# Patient Record
Sex: Male | Born: 1985
Health system: Southern US, Community
[De-identification: ages and names within clinical notes are randomized; demographics above are authoritative.]

## PROBLEM LIST (undated history)

## (undated) DIAGNOSIS — D571 Sickle-cell disease without crisis: Secondary | ICD-10-CM

---
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
## 2007-01-15 ENCOUNTER — Emergency Department (HOSPITAL_COMMUNITY): Admission: EM | Admit: 2007-01-15 | Discharge: 2007-01-15 | Payer: Self-pay | Admitting: Emergency Medicine

## 2007-01-20 ENCOUNTER — Emergency Department (HOSPITAL_COMMUNITY): Admission: EM | Admit: 2007-01-20 | Discharge: 2007-01-20 | Payer: Self-pay | Admitting: Emergency Medicine

## 2007-01-30 ENCOUNTER — Ambulatory Visit: Payer: Self-pay | Admitting: Infectious Diseases

## 2007-01-30 ENCOUNTER — Inpatient Hospital Stay (HOSPITAL_COMMUNITY): Admission: EM | Admit: 2007-01-30 | Discharge: 2007-02-07 | Payer: Self-pay | Admitting: Emergency Medicine

## 2007-02-07 DIAGNOSIS — K829 Disease of gallbladder, unspecified: Secondary | ICD-10-CM

## 2007-02-07 HISTORY — DX: Disease of gallbladder, unspecified: K82.9

## 2007-02-12 ENCOUNTER — Ambulatory Visit: Payer: Self-pay | Admitting: Family Medicine

## 2007-02-15 ENCOUNTER — Encounter (INDEPENDENT_AMBULATORY_CARE_PROVIDER_SITE_OTHER): Payer: Self-pay | Admitting: Internal Medicine

## 2007-02-15 DIAGNOSIS — F17200 Nicotine dependence, unspecified, uncomplicated: Secondary | ICD-10-CM | POA: Insufficient documentation

## 2007-02-15 DIAGNOSIS — F1210 Cannabis abuse, uncomplicated: Secondary | ICD-10-CM | POA: Insufficient documentation

## 2007-02-15 DIAGNOSIS — D571 Sickle-cell disease without crisis: Secondary | ICD-10-CM | POA: Insufficient documentation

## 2007-02-15 HISTORY — DX: Cannabis abuse, uncomplicated: F12.10

## 2007-02-18 ENCOUNTER — Encounter (INDEPENDENT_AMBULATORY_CARE_PROVIDER_SITE_OTHER): Payer: Self-pay | Admitting: Internal Medicine

## 2007-08-01 ENCOUNTER — Inpatient Hospital Stay (HOSPITAL_COMMUNITY): Admission: EM | Admit: 2007-08-01 | Discharge: 2007-08-05 | Payer: Self-pay | Admitting: Emergency Medicine

## 2007-08-01 ENCOUNTER — Ambulatory Visit: Payer: Self-pay | Admitting: *Deleted

## 2007-09-03 ENCOUNTER — Ambulatory Visit: Payer: Self-pay | Admitting: Family Medicine

## 2007-09-30 ENCOUNTER — Ambulatory Visit: Payer: Self-pay | Admitting: Infectious Disease

## 2007-09-30 ENCOUNTER — Inpatient Hospital Stay (HOSPITAL_COMMUNITY): Admission: EM | Admit: 2007-09-30 | Discharge: 2007-10-08 | Payer: Self-pay | Admitting: Emergency Medicine

## 2007-10-07 ENCOUNTER — Ambulatory Visit: Payer: Self-pay | Admitting: Oncology

## 2007-10-07 ENCOUNTER — Encounter: Payer: Self-pay | Admitting: Infectious Disease

## 2011-04-11 NOTE — Discharge Summary (Signed)
NAME:  Jerry [name], Jerry [name] NO.:  [id number]   MEDICAL RECORD NO.:  [id number]          PATIENT TYPE:  INP   LOCATION:  6711                         FACILITY:  [facility]   PHYSICIAN:  Manning Charity, MD     DATE OF BIRTH:  1986/02/18   DATE OF ADMISSION:  08/01/2007  DATE OF DISCHARGE:  08/05/2007                               DISCHARGE SUMMARY   DISCHARGE DIAGNOSES:  1. Acute sickle cell crisis.  2. Sickle cell disease.  3. Acute-on-chronic anemia.  4. Gallbladder sludge.  5. Tobacco abuse.  6. Marijuana abuse.  7. History of pneumonia.   DISCHARGE MEDICATIONS:  1. Hydroxyurea 500 mg 3 tablets p.o. daily for a total of 1500 mg      daily.  2. MS Contin 30 mg 1 tab p.o. every 12.  3. Percocet 5/325 one to two tablets p.o. every 4 hours p.r.n.      breakthrough pain.  4. Naproxen 500 mg 1 tablet p.o. b.i.d. with meals.   DISPOSITION AND FOLLOWUP:  The patient is to see Dr. Audria Nine at  [facility] on September 29 at 2:45 p.m.  During this visit, his CBC  should be checked to assess for stable hemoglobin.  Also, given the  patient's pain crisis during hospitalization, he will be discharged on  naproxen 500 mg b.i.d. along with MS Contin 30 mg b.i.d. and Percocet  for breakthrough pain.  Pain should be assessed and patient advised on  pain medication.   CONSULTATIONS:  There were no consultations done on this  hospitalization.   BRIEF HISTORY AND PHYSICAL:  This is an 25 year old African-American  male with history of sickle cell disease and prior admissions for sickle  cell crisis who now comes in with complaints of left arm pain for 1 day.  The pain is from the left hand all the way to the shoulder.  He had  similar pain over the last few days.  The pain is 9 to 10/10, and he  complains of weakness in his left arm.  Pain is not improved with  Tylenol or Percocet in the hospital.  He has been missing doses of his  medicine over the last week.   PHYSICAL  EXAMINATION:  VITALS:  Temperature 97.7, blood pressure 146/87,  pulse 93, respirations are 16, oxygen saturation 99% on room air.  GENERAL:  No acute distress.  Appears a little drowsy but awake.  HEENT:  Eyes:  PERRLA.  EOMI.  Anicteric.  ENT:  Mucous membranes are  pink and moist.  RESPIRATION:  Clear to auscultation bilaterally.  CARDIOVASCULAR:  S1, S2 present.  Regular rate and rhythm.  No murmurs,  gallops, or rubs.  GI:  Positive bowel sounds.  Nontender, nondistended.  No palpable  masses.  EXTREMITIES:  No edema.  SKIN:  Cool and dry.  LYMPHATICS:  No adenopathy noted.  NEURO:  5/5 strength in all 4 extremities.  PSYCH:  Alert and oriented x3.  Pleasant affect.   LABORATORY:  WBC 16.8, hemoglobin of 10, and platelets of 363.  Sodium  of 139, potassium 3.6, chloride 107, bicarbonate of 28.  BUN 6,  creatinine 0.52, glucose of 101, bilirubin 2.0, alk phos 132.  AST 22,  ALT 16, protein 7.8, albumin 4.1, calcium 9.3.  PT 14.4, INR 1.1, APTT  32.   HOSPITAL COURSE:  1. Sickle cell crisis:  The patient was hydrated, given pain      medications including naproxen, Dilaudid, and morphine for      breakthrough pain.  The patient complains of increased pain with      point tenderness along the lateral epicondyle .  Therefore, an MRI      of the left upper extremity was obtained and found to be nagative      for osteomyelitis, avascular necrosis, or acute infarction.      Mariea Stable, MD  Electronically Signed      Manning Charity, MD  Electronically Signed    MA/MEDQ  D:  08/12/2007  T:  08/12/2007  Job:  [id number]   cc:   Maurice March, M.D.
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
## 2011-04-14 NOTE — Discharge Summary (Signed)
[place]. [facility]  Patient:    Jerry [name], Jerry [name]                  MRN: [id number] Adm. Date:  [id number] Disc. Date: [id number] Attending:  Delle Reining Dictator:   Melba Coon, M.D.                           Discharge Summary  DISCHARGE DIAGNOSIS:  Sickle disease vaso-occlusive pain crisis.  DISCHARGE MEDICATIONS:  Tylenol with Codeine elixir 12 mg per 5 ml, 4 cc q.4h. p.r.n. pain.  SUMMARY:  The patient is a 25 year old African-American male with hemoglobin SS disease who presented on February 28, 2001, with chest, back, and bilateral arm pain.  He states that the pain began approximately a day before admission and it usually with a crisis, he normally presents with low back pain.  The patient had been taking Tylenol No.3 at home without success.  The patient has also had URI symptoms for several days with cough and rhinorrhea.  His last crisis was approximately four months ago, and he states that he usually has then about every eight months.  He has never had acute chest syndrome that he or his mother is aware of. He had been seen at [facility] and had received a dose of ceftriaxone.  His temperature at home was approximately 101. The patient was admitted for treatment of his sickle cell vaso-occlusive pain crisis in the setting of fever and URI symptoms.  His chest x-ray was negative.  LABORATORY DATA:  White blood cell count 15.9, hemoglobin 7.6, hematocrit 20.9, platelets 428.  Sodium 139, potassium 3.3, chloride 105, bicarb 23, BUN 10, creatinine 0.4, glucose 95.  HOSPITAL COURSE:  The patient was admitted to the pediatric floor for further management.  #1 - Vaso-occlusive pain crisis.  The patient was initially started on a morphine PCA pump.  He felt that the pain was moderately controlled on the PCA pump and he was also treated with Toradol.  He had a varying response to the pain medicine and there was  thought to be possible behavioral or psychosocial reasons behind that.  The patient was eventually changed to demand dose PCA only.  After an additional day, he was able to be discontinued from his PCA pump.  His hemoglobin on discharge was 7.5.  His reticulocyte count on discharge was 2.4.  His blood cultures remained with no growth x 5 days.  The patient had been treated with ceftriaxone empirically due to his fever.  He showed no signs of infection on chest x-ray and his IV antibiotics were discontinued.  #2 - Psychosocial.  The patient was seen by Dr. Lindie Spruce.  There was concerns that he was possibly struggling with some depression.  He did acknowledge feeling down and depressed as well as getting very angry.  Dr. Lindie Spruce found that Jerry [name] is at a critical place in his life right now being a very young black male with a chronic illness and significant pain, very little active family help.  He lives with his mother, but realizes that she does not follow through on his behalf.  He also idolizes his distant father.  Dr. Lindie Spruce recommended referral to a psychologist.  CPS had been contacted and the investigator said that he would see that [name] would keep his appointments at [facility].  He also stated that he would make sure that Shamere receives counseling.  Mother has  a history of noncompliance and this was addressed during this hospitalization.  CONDITION ON DISCHARGE:  Good.  DISPOSITION:  Discharged to home.  DISCHARGE MEDICATIONS:  As per above.  DISCHARGE INSTRUCTIONS:  The patient was given no specific instructions in terms of his diet or activity.  He was given specific instructions and signs and symptoms to warrant further follow-up.  FOLLOW-UP:  The patient has an appointment at [facility] on March 13, 2001, at 8:30 a.m. with Dr. Waldo Laine.  [employer] [employer] Zenaida Niece will pick them up for the appointment at [facility].  Also had a follow-up appointment with Dr. Jenne Campus on March 19, 2001,  at 9:30 a.m.  Family voiced agreement and understanding of the above discharge plan and had no further questions. DD:  03/19/01 TD:  03/20/01 Job: [id number]

## 2011-04-14 NOTE — Discharge Summary (Signed)
NAME:  MICHAELA, SHANKEL NO.:  [id number]   MEDICAL RECORD NO.:  [id number]          PATIENT TYPE:  INP   LOCATION:  6121                         FACILITY:  [facility]   PHYSICIAN:  Eliseo Gum, M.D.   DATE OF BIRTH:  06-11-1986   DATE OF ADMISSION:  04/03/2006  DATE OF DISCHARGE:  04/05/2006                                 DISCHARGE SUMMARY   DISCHARGE DIAGNOSES:  1.  Sickle cell pain crisis.  2.  Tobacco abuse.  3.  Medical noncompliance with hydroxyurea.   DISCHARGE MEDICATIONS:  1.  Hydroxyurea 500 mg -- take three pills daily.  2.  Toradol 10 mg one q.6 h., only 15 provided.  3.  Percocet -- take one to two pills q.4 h. as needed for pain.   PROCEDURES:  There was a chest x-ray on Apr 03, 2006 showing no acute  cardiopulmonary disease, cardiac size towards the upper limits of normal, no  pulmonary vascular congestion or active lung process.  It did say that  osseous changes, particularly involving the thoracic and lumbar spine, are  noted, compatible with chronic sickle cell disease.   A hip x-ray was done on the right and x-rays of the femur and knee and  cervical and thoracic spine and they were all negative; those were done on  Apr 03, 2006.  Only some bony changes compatible with sickle cell disease  were noted in the thoracic spine.   CONSULTATIONS:  None.   HISTORY AND PHYSICAL:  For full history and physical, please see consult the  chart, but in brief, Mr. Eichorn is a 25 year old African American man with  sickle cell disease and complaining of sickle cell crisis in his back,  neck, right ribs and back of the head and right upper leg and lower back.  He complained of chills.  His symptoms started at 8:00 in the morning on the  day of admission and worsened with shortness of breath.  He denied any  wheezing, nausea, vomiting or abdominal pain or fever or joint swelling.  He  takes hydroxyurea 1500 mg nightly and said he took a dose the night prior  to  admission, but says that in actuality, he had not really taken it regularly  for about 2 months.  He also just recently finished a course of amoxicillin  after he had his tooth pulled.   ALLERGIES:  No allergies.   PHYSICAL EXAMINATION:  VITAL SIGNS:  Temperature 98.5, blood pressure  121/71, pulse 86, respirations 20, O2 SAT 97% on room air.  GENERAL:  He is alert and oriented, in acute distress secondary to pain  which was 10/10.  HEENT:  Eyes anicteric.  Mucous membranes are moist.  No erythema.  NECK:  Tender to light palpation on the posterior neck and it was  exquisitely tender.  LUNGS:  Clear to auscultation bilaterally.  No wheezes.  No rales.  HEART:  Regular rate and rhythm.  No murmurs, rubs, or gallops.  ABDOMEN:  Decreased breath sounds and the abdomen was tense, as the patient  was bracing himself against the pain.  EXTREMITIES:  Right hip  and thigh were exquisitely tender to palpation.  SKIN:  Warm and dry.  MUSCULOSKELETAL:  He had decreased movement secondary to pain.  NEUROLOGIC:  Nonfocal.  PSYCHIATRIC:  Calm and appropriate.   LABORATORY DATA:  Basic metabolic panel was normal.  Bilirubin was 1.8,  protein was 8.1, albumin was 4.9 and everything was normal.  Hemoglobin  11.1, hematocrit 32.7 with an MCV of 106.4 and a reticulocyte count of 9.4%.  The white count was 11.6 with an ANC of 8 and platelets 415,000.  Sickle  cells were noted on CBC.  UA was within normal limits.   HOSPITAL COURSE:  He was felt to be in sickle cell pain crisis involving the  neck, ribs, right hip and thigh plus right knee and so he was started on  Dilaudid 1 mg to 4 mg IV p.r.n. and Toradol standing dose with Protonix  b.i.d. to protect against the GI side-effects.  This was sufficient to  control his pain.  We hydrated him gently and evaluated all the areas of  complaints with x-rays to rule out avascular necrosis or fat embolism.  Since the x-rays were normal, his symptoms were  most consistent with pain  crisis relating to sickle cell disease.  We drew 2 sets of blood cultures,  checked a urine drug screen and blood culture was negative.  We also checked  a ferritin, RBC folate and B12 in view of his elevated MCV, although we  mostly attributed that to the elevated reticulocyte count.  We also checked  a GC and Chlamydia because he noted that he was sexually active, but it was  negative.  Actually, the urine drug screen was positive for opiates and  benzodiazepines; he had gotten the opiates in the ED.  His ferritin was  elevated slightly at 596.  His B12 was 541, which is normal.  His RBC folate  was 1157, which is elevated.  His bilirubin did increase over the course of  his hospitalization from 1.8 to 3.4, but the rest of his liver function  tests remained normal.  His hemoglobin only dropped as low at 9.8 and he had  a white count of 13.4 at one point, which is the highest it was during his  hospitalization.  On the day of discharge, he stated that his pain was 7/10,  but was walking around and making his bed.  He first said that he wanted to  stay in the hospital for a couple more days to get pain medicines, but when  we offered him oral pain medicines, he really decided he would rather go  home; we were comfortable with this decision, because he was ambulating and  did not appear to be in any acute distress.   He was also advised during his hospitalization to quit smoking, particularly  of a sickle cell patient, and we ordered a smoking cessation consultation  for him, which did occur.   DISCHARGE PHYSICAL EXAM:  VITAL SIGNS:  Temperature was 98.7, blood pressure  was 121/71, pulse 87-104, respirations 18-20 and O2 SAT was 97% on room air.  GENERAL:  He is alert and oriented, in no acute distress.  He rated his pain  at 7/10, but was walking around. HEART:  Regular rate and rhythm.  No rubs; a mild flow murmur was present,  however.  LUNGS:  Clear to  auscultation bilaterally.  ABDOMEN:  Nontender and non-distended.  Flat.  EXTREMITIES:  He is walking without difficulty.  NEUROLOGIC:  Intact.  DISCHARGE LABORATORY DATA:  Hemoglobin 9.8, hematocrit 28.2, MCV is 105.9,  white count 13.4, platelets 319,000.  His RDW was elevated also at 19.6%.  His basic metabolic panel was normal and again, his bilirubin was 3.4 on  discharge.   DISCHARGE MEDICATIONS:  He was discharged with Toradol for several days,  only to complete a total of a 5-day course of Toradol, which is the maximum  recommended course of this medication.  He was also given Percocets.  These  medications adequately controlled his pain.   FOLLOWUP:  He was told to follow up with his sickle cell physician at [facility] and that is Dr. Joycelyn Man, and he would need to call for an  appointment.   SPECIAL DISCHARGE INSTRUCTIONS:  We also instructed him to take his  hydroxyurea every day and this point was reinforced by the case workers who  visited him during his hospitalization.      Clearance Coots, M.D.      Eliseo Gum, M.D.  Electronically Signed    IN/MEDQ  D:  04/10/2006  T:  04/11/2006  Job:  [id number]   cc:   [facility] Hematology/Oncology Charise Killian, MD

## 2011-04-14 NOTE — Discharge Summary (Signed)
[place]  Patient:    Jerry [name], Jerry [name] Visit Number: [id number] MRN: [id number]          Service Type: Attending:  Susette Racer. Leotis Shames, M.D. Dictated by:   Ellwood Handler, M.D. Adm. Date:  12/15/01 Disc. Date: 12/24/01                             Discharge Summary  DISCHARGE DIAGNOSES: 1. Sickle cell disease, acute pain crisis. 2. Acute chest syndrome. 3. History of sickle cell anemia (SS) disease.  DISCHARGE MEDICATIONS: 1. Penicillin DK 250 mg p.o. b.i.d. 2. Ceftin 250 mg p.o. b.i.d. x2 days. 3. Tylenol #3 p.o. q.6h. p.r.n. pain.  HISTORY OF PRESENT ILLNESS:  Please see full H&P for details.  In brief, Jerry [name] is a 25 year old African-American male with a history of sickle cell anemia.  He presented to [facility] with a 2-day history of bilateral rib pain and back pain.  The patient has been hospitalized multiple times in the past for acute pain crises.  LABS ON ADMISSION:  WBC 16.5, hemoglobin 9.5, hematocrit 26.6, platelets 375. Reticulocyte count 11.4%.  Absolute reticulocyte 339.75.  HOSPITAL COURSE: #1 - SICKLE CELL VASO-OCCLUSIVE PAIN CRISES.  His pain was managed with morphine PCA pump, with gradual improvement and control of pain.  His hemoglobin decreased to 6.8 on December 21, 2001 and the patient was transfused one unit of packed red blood cells, which he tolerated well.  #2 - ACUTE CHEST SYNDROME.  The patient was doing fairly well until about two days after admission, when he developed fever to 104, tachypnea and decreased O2 saturation.  The patient was treated with IV Rocephin and Zithromax.  An initial chest x-ray revealed increased markings in the right middle lobe.  A follow-up chest x-ray then revealed bilateral basilar infiltrates, with pleural effusions.  In addition to antibiotic treatment the patient was encouraged to do incentive spirometry q.1-2h.; he also received  albuterol nebulizers.  Blood cultures were obtained and showed no growth x5 days.  The patient gradually defervesced and his respiratory status gradually improved. By the day of discharge the patient was breathing comfortably with good pain control, and maintaining oxygen saturations of 95% on room air.  DISCHARGE INSTRUCTIONS:  The patient was discharged to home in stable condition.  FOLLOW-UP:  The following appointments were scheduled: 1. December 30, 2001 -- Dr. Jenne Campus, [facility].  3:30 p.m. 2. January 02, 2002 -- Dr. Waldo Laine, [facility] Hematology/Oncology. 12:00 p.m. 3. January 02, 2002 -- The patient is to receive transcranial Doppler at    1:00 p.m. following appointment with Dr. Waldo Laine. Dictated by:   Ellwood Handler, M.D. Attending:  Susette Racer. Leotis Shames, M.D. DD:  06/05/02 TD:  06/09/02 Job: [id number]

## 2011-04-14 NOTE — Consult Note (Signed)
NAME:  Jerry [name], Jerry [name] NO.:  [id number]   MEDICAL RECORD NO.:  [id number]          PATIENT TYPE:  INP   LOCATION:  5743                         FACILITY:  [facility]   PHYSICIAN:  Clovis Pu. [name], Jerry BaileyDATE OF BIRTH:  [birth year]   DATE OF CONSULTATION:  01/31/2007  DATE OF DISCHARGE:                                 CONSULTATION   REASON FOR CONSULTATION:  Abdominal pain and leukocytosis.   HISTORY OF PRESENT ILLNESS:  Jerry [name] is an 25 year old male patient  history of sickle cell disease as well as some history of noncompliance  although currently denies medication noncompliance issues at this point.  He was admitted with extremity and back and neck pain as well as  leukocytosis - white count 21,000.  This initially was presented to be  related to his sickle cell crisis.  He has had repeated hospitalizations  since 2001 for same issues.  Today on exam the patient was felt to have  abdominal pain, diminished bowel sounds and some guarding and diffuse  abdominal nonfocal pain.  His white count had also increased to the  23,000 range.  A CT scan was ordered, took some time to get this done  despite a stat order but eventually this was performed.  The CT was  reviewed per Dr. Luisa Hart and the radiologist and no acute findings have  been noted.  The patient continues to have nausea although his pain has  resolved.  He has had low-grade fevers in the past 24 hours.  Surgical  evaluation has been requested.   REVIEW OF SYSTEMS:  As above.  Again, the patient is complaining of  continued nausea.  He did vomit after receiving oral CT contrast.  He  has had no diarrhea or other GI complaints.   PAST MEDICAL HISTORY:  1. Sickle cell disease with repeated crisis episodes x9 since 2001.      He is followed at [facility].  2. Tobacco abuse.   PAST SURGICAL HISTORY:  None documented.   ALLERGIES:  NKDA.   CURRENT MEDICATIONS:  At home the patient takes hydroxyurea.  Please  see  the patient's medication administration record for current medications  here at the hospital.   PHYSICAL EXAMINATION:  GENERAL:  Pleasant male patient currently denying  pain, complaining of nausea.  VITAL SIGNS:  The patient has just returned from CT.  I do not have any  recent vital signs but over the past 24 hours his T-max has been 100.6,  systolic blood pressures ranged anywhere from 130s to 140s and  diastolic's 80-90, mildly tachycardic heart rate 110s and respirations  in the 20s.  NEURO:  The patient is alert, oriented x3, moving all extremities x4.  No focal deficits.  HEENT:  Head normocephalic.  Sclerae not injected.  NECK:  Supple.  No adenopathy.  CHEST:  Bilateral lung sounds clear to auscultation.  Respiratory effort  is nonlabored.  He is on nasal cannula O2.  CARDIAC:  Grade 3/6 systolic ejection murmur.  Pulse is tachycardic but  regular.  No JVD.  Pulses regular.  ABDOMEN:  Soft, flat, nondistended, mildly tender without  any focal  signs.  Bowel sounds are present.  No obvious hepatosplenomegaly, masses  or bruits on exam.  EXTREMITIES:  Symmetrical in appearance without edema, cyanosis or  clubbing.  Pulses are palpable.   LABORATORIES:  White count yesterday 21,000, today is 24,500; hemoglobin  10.1, platelets 365,000.  He was also found to have target cells and  Howell-Jolly bodies as well.  Sodium 129, potassium 3.9, CO2 26, glucose  110, BUN 10, creatinine 0.6.  Urinalysis is negative.   DIAGNOSTICS:  Chest x-ray done yesterday shows no acute process.  CT of  the abdomen and pelvis as noted.   IMPRESSION:  1. Diffuse abdominal pain resolved.  2. Leukocytosis.  3. Sickle cell with acute crisis.   PLAN:  1. Continue to treat at this point pain as if it is related to sickle      cell crisis, i.e., hydration, pain medication, etc.  2. Nausea could be coming from hyponatremia, would correct this.  3. Uncertain at this point why the patient is having  significant      leukocytosis and low-grade fevers.  Urinalysis is negative and CT      is unrevealing.  4. No surgical issues at this time.  We will continue to follow with      you.      Allison L. Rennis Harding, N.P.      Jerry [name], M.D.  Electronically Signed    ALE/MEDQ  D:  01/31/2007  T:  01/31/2007  Job:  [id number]

## 2011-04-14 NOTE — Discharge Summary (Signed)
[place]  Patient:    Jerry [name], Jerry [name]                  MRN: [id number] Adm. Date:  [id number] Disc. Date: 02/27/00 Attending:  Gerrianne Scale Dictator:   Denese Killings, M.D. CC:         [facility]             [facility]a M[facility], M.D. at [facility], pediatric [facility]                           Discharge Summary  DATE OF BIRTH:  September 21, 1986  PRIMARY CARE PHYSICIAN:  [facility].  CHIEF COMPLAINT:  This is an 25 year old African-American male with a sickle cell pain crisis.  HISTORY OF PRESENT ILLNESS:  Jerry [name] presented to the emergency department with a sickle cell pain crisis on the day of admission that was localized to his back.  His pain crisis is usually controlled with Tylenol No. 3, but the mom had run out of this medication and so she presented to the emergency department.  He received morphine 4 mg IM and was discharged home, but returned a few hours later with unmanageable pain after the morphine.  He was then admitted for that reason.  At the time of his presentation, he was afebrile.  His hemoglobin and hematocrit were 8.4 and 23.9.  A chest x-ray was obtained that was negative.  The patient was started on a morphine PCA at 0.5 mg per hour at baseline, with morphine 0.5 mg every 10 minutes.  He also was started on Tylenol No. 3 and ibuprofen, and started on maintenance IV fluids.  On hospital day #2, he had a spike of his temperature to 102 degrees. Blood cultures and repeat chest x-ray were obtained and both were negative. He had two episodes of desaturations to less than 92%, was placed on 1 liter of oxygen.  He was also started on Rocephin on day #2.  On day #2, he was also started on incentive spirometry.  On day #3, he had another temperature spike to 103.6 and another repeat chest x-ray was negative.  He was started on a Z pack.   He continued to take in good intake and urine output throughout the entire course.  On day #4 of his admission, he was having a lot of abdominal pain.  A KUB was obtained which showed an ileus and a lot of stool.  Later on day #4, he had a couple of bowel movements and his abdominal pain resolved. On day #5, he stated that his pain was much better, he was having no abdominal pain, only back pains.  He was able to be weaned off of the morphine PCA pump and was maintained on Tylenol No. 3.  He remained afebrile for the last 24 hours of his admission and continued to take in good p.o. intake and have good urine output.  At the time of discharge, his hemoglobin and hematocrit were 6.8 and 20.7.  His pain at the time of discharge was 5-6/10.  FOLLOW-UP:  On discharge, the patient was instructed to make an appointment with his [facility] hematologist, Dr. Waldo Laine, by calling the [facility] [place] on the day after his discharge.  MEDICATIONS:  He will continue  with his pain management with Tylenol, with codeine at home 12.5 cc q.6h. as needed, and azithromycin 250 mg - his last dose will be at 8 p.m. on the day of discharge, and also to continue with his penicillin prophylaxis of 250 mg once a day.  He was given 30 days of this and three refills.  SPECIAL INSTRUCTIONS:  Return to medical care for fevers, difficulty with breathing, or severe pain. DD:  02/27/00 TD:  02/27/00 Job: [id number]

## 2011-04-14 NOTE — Discharge Summary (Signed)
[place]  Patient:    MEREK, NIU Visit Number: [id number] MRN: [id number]          Service Type: PED Location: PEDS [phone] Attending Physician:  Asher Muir Admit Date:  11/07/2001 Discharge Date: 11/17/2001                             Discharge Summary  PRIMARY CARE PHYSICIAN:  Dr. Tarri Abernethy at [facility].  CONSULTATIONS:  Dr. Waldo Laine at [facility].  FINAL DIAGNOSES: 1. Sickle cell disease. 2. Acute pain crisis. 3. Right upper extremity pain.  LABORATORY DATA:  Blood culture from 12/15, negative x 5 days.  CBC from 12/12, white blood cell count 23.8, hemoglobin 9.1, hematocrit 25.3, platelet count 573.  Differential 92% neutrophils, 4% lymphocytes, 4% monocytes. Reticulocyte 10.8.  AST 74, ALP 28, alkaline phosphatase 182, total bilirubin 2.8.  Urinalysis from 12/13, specific gravity 1.009, pH 7.  Negative for bilirubin, nitrites, and leukocyte esterase.  Right upper extremity x-ray completed on 11/16/01, results consistent with effusion of the elbow joint, but no bone involvement or periosteal elevation.  ADMISSION HISTORY AND PHYSICAL:  Please see admission H&P for full details.  HOSPITAL COURSE:  This is a 25 year old male with sickle cell disease who complained of bilateral lower extremity pain from his kneecaps to his ankles after wrestling practice on the day of admission.  He also had suffered a fall on his left knee approximately one week ago.  He was evaluated at [facility], and had a follow-up appointment with [facility] scheduled sometime in January.  His mom also stated that he had a fever to 102 degrees prior to admission.  He was brought into the hospital with the diagnosis of pain crisis, and he received IV fluids and pain management.  #1 - NEUROLOGIC:  With regard to his pain, he was placed on a PCA pump with morphine with an original basal rate of 2 mg per hour, and an on demand  of 0.25 mg every 10 minutes, with a max of 2 g.  He also received Toradol for his first two days of admission which was eventually discontinued.  His pain control improved over his hospitalization, eventually he was decreased on his basal rate to 1.0 while his on demand rate remained the same.  He was moved over to p.o. pain medications before discharge, specifically Tylenol with Codeine which he tolerated well.  On the day of discharge, he was still complaining of right upper extremity pain with extension, however, he had no pain with palpation of the joint.  It was thought at the time that his pain was secondary to IV infiltration at the site.  #2 - INFECTIOUS DISEASE:  The patient had spiking fevers consistently through the early part of his hospitalization with temperature maxs up to about 103. He was originally started on antibiotics, specifically, ceftriaxone 1 g IV q.24h.  He received ceftriaxone for 3 days, and on hospital day #2, Zithromax 450 mg x 1 was added to his regimen.  He completed a full course of Zithromax. On the day of discharge, he had been afebrile for over 48 hours.  #3 - RESPIRATORY/CARDIOVASCULAR:  During his hospitalization he had a normal respiratory rate and was saturating well on room air.  His cardiovascular status was normal.  At one point during his hospitalization, he had some high blood pressures with values of 163/123.  However, this resolved  without any intervention.  #4 - GASTROINTESTINAL:  During his hospitalization he had issues with constipation, probably secondary to the morphine that we used for his pain. He received occasional doses of milk of magnesia approximately 15 cc, which resulted in a bowel movement each time.  On the day of discharge, he was stooling on his own.  INSTRUCTIONS GIVEN TO PATIENT AND FAMILY:  Specific instructions were given to the patient regarding his pain management with p.o. medications, and his need for follow-up  appointment with Dr. Tarri Abernethy at [facility] at 10:00 on 12/23.  He was also told to follow up with Dr. Waldo Laine at [facility] regarding his sickle cell disease.  DISCHARGE MEDICATIONS: 1. Colace 100 mg q.d. 2. Tylenol No. 3 with codeine 15 mL q.4h. p.r.n. pain. 3. Tylenol Extra Strength 500 mg q.4-6h. p.r.n. pain. Attending Physician:  Asher Muir DD:  11/17/01 TD:  11/19/01 Job: [phone]

## 2011-04-14 NOTE — H&P (Signed)
NAME:  Jerry [name], Jerry [name] NO.:  [id number]   MEDICAL RECORD NO.:  [id number]          PATIENT TYPE:  EMS   LOCATION:  MAJO                         FACILITY:  [facility]   PHYSICIAN:  Elliot Cousin, M.D.    DATE OF BIRTH:  [birth year]   DATE OF ADMISSION:  09/24/2006  DATE OF DISCHARGE:                                HISTORY & PHYSICAL   PRIMARY CARE PHYSICIAN:  [facility].   CHIEF COMPLAINT:  Three-day history of right greater than left leg pain and  left greater than right arm pain.   HISTORY OF PRESENT ILLNESS:  The patient is an 25 year old man with a past  medical history significant for sickle cell disease who presents to the  emergency department with a three-day history of bilateral lateral leg pain  and bilateral arm pain.  The pain is worse in the right leg and left arm.  He describes the pain has throbbing and sharp.  He says that the pain at its  worst is rated a 9-10/10 in intensity.  It was initially intermittent three  days ago.  However, it has been progressive and chronic for the past 24  hours.  The patient has not taken any type of analgesic or pain medication  over the counter.  He continues to take hydroxyurea as previously  prescribed.  He says that his pain is indicative of a sickle cell pain  crisis.  He has had no associated fever, chills, nausea, vomiting, diarrhea,  chest pain, shortness of breath, or abdominal pain.  He has had a productive  cough with tan-colored sputum over the past two days.  He does smoke half a  pack of cigarettes per day.   During the evaluation in the emergency department, the patient is noted to  be afebrile with a temperature of 99.4.  He is hemodynamically stable.  His  white blood cell count is mildly elevated at 11.9 and his hemoglobin is  mildly low at 10.6.  His reticulocyte count is elevated at 281.3.  The  patient will be admitted for further evaluation and management.   PAST MEDICAL HISTORY:  1.  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  2. Tobacco abuse.   MEDICATIONS:  Hydroxyurea 500 mg three tablets daily.   ALLERGIES:  No known drug allergies.   SOCIAL HISTORY:  The patient is engaged to be married.  [place]  He has no children.  He is unemployed.  He is a  Consulting civil engineer at [employer]  He smokes approximately eight cigarettes per day.  He  denies alcohol and illicit drug use.   FAMILY HISTORY:  The patient's mother is 45 years of age and she has the  sickle cell trait.  His father is 67 years of age and has the sickle cell  trait and Crohn's disease.   REVIEW OF SYSTEMS:  Other than the history of present illness which includes  arm pain, leg pain and productive cough, the patient's review of systems is  otherwise negative.   REVIEW OF SYSTEMS:  The patient's review of systems is otherwise negative.   PHYSICAL EXAMINATION:  VITAL SIGNS:  Temperature 99.4, blood pressure  114/69, pulse 66, respiratory rate 20, oxygen saturation 98% on room air.  GENERAL:  The patient is an 25 year old African American man who is  currently lying in bed in no acute distress.  HEENT:  Head is normocephalic, atraumatic.  Pupils are equal, round and  reactive to light.  Extraocular movements are intact.  Conjunctivae clear.  Sclerae white.  Tympanic membranes not examined.  Nasal mucosa is moist.  No  sinus tenderness.  Oropharynx with moist mucous membranes.  No posterior  exudates or erythema. Teeth are in good repair.  NECK:  Supple. No adenopathy.  No thyromegaly.  No bruits.  No JVD.  LUNGS:  Clear to auscultation bilaterally.  HEART:  S1 and S2 with a soft systolic murmur.  ABDOMEN:  Bowel sounds are present.  Soft, nontender, nondistended.  No  hepatosplenomegaly.  No masses palpated.  EXTREMITIES:  The patient has moderate tenderness over the right greater  than left legs and left greater  than right arms.  No erythema or edema  noted.  The patient has a decrease in range of motion of his right leg and  left arm secondary to discomfort.  Pedal pulses are 2+ bilaterally.  No  acute joint findings.  NEUROLOGIC:  The patient is alert and oriented x3.  Cranial nerves II-XII  intact..  Strength is 5/5 throughout.  Sensation is intact.   ADMISSION LABORATORY DATA:  Reticulocyte count percentage 9.6.  Absolute  reticulocyte count 281.3.  WBC 11.9, hemoglobin 10.6, hematocrit 30.3, MCV  103.7, platelets 323.  Chest x-ray:  No acute cardiopulmonary disease.   ASSESSMENT:  1. Bilateral arm pain and leg pain consistent with sickle cell pain      crisis.  2. Anemia secondary to chronic disease (sickle cell disease).  The      patient's hemoglobin is 10.6.  He is unaware of his baseline      hemoglobin.  The patient's anemia is also macrocytic. His MCV is 103.7.  3. Leukocytosis.  The patient's leukocytosis is mild.  His white blood      cell count is 11.9.  More than likely the leukocytosis is secondary to      the sickle cell pain crisis.  He is afebrile.  4. Mildly productive cough.  The patient's chest x-ray is clear.  However,      there is a low threshold for starting antibiotic treatment if his      pulmonary symptoms worsen and/or if he becomes febrile.  5. Tobacco abuse.   PLAN:  1. The patient will be admitted for further evaluation management.  2. Will hydrate.  The patient will have normal saline with potassium      chloride added.  3. Will treat his pain with morphine intravenously and oral morphine      q.12h.  Will also add Toradol 15 mg IV q.6h. for 24 hours and then      p.r.n. thereafter.  4. Continue hydroxyurea as previously ordered.  5. Will add folic acid 1 mg daily.  Will assess the patient's vitamin B12      and RBC folate levels. 6. Tobacco cessation counseling.  Will add a nicotine patch of 7 mg daily.  7. Will check a TSH, an urinalysis and CMET.  Will  monitor his hemoglobin      and  hematocrit closely.      Elliot Cousin, M.D.  Electronically Signed     DF/MEDQ  D:  09/25/2006  T:  09/25/2006  Job:  [id number]

## 2011-04-14 NOTE — Discharge Summary (Signed)
NAME:  LEAM, MADERO NO.:  [id number]   MEDICAL RECORD NO.:  [id number]          PATIENT TYPE:  INP   LOCATION:  5743                         FACILITY:  [facility]   PHYSICIAN:  Fransisco Hertz, M.D.  DATE OF BIRTH:  06-06-1986   DATE OF ADMISSION:  DATE OF DISCHARGE:  02/07/2007                               DISCHARGE SUMMARY   DISCHARGE DIAGNOSES:  1. Acute sickle cell pain crisis - improved.  2. Sickle cell disease.  3. Acute on chronic anemia.  4. Gallbladder sludge.  5. Tobacco abuse.  6. Marijuana abuse.   DISCHARGE MEDICATIONS:  1. Hydroxyurea 500 mg p.o. t.i.d.  2. Folic acid 1 mg p.o. daily.  3. Ibuprofen 800 mg p.o. every 8 hours p.r.n. for pain.  4. Hydromorphone 2 mg p.o. every 4 hours p.r.n. for breakthrough pain.   DISPOSITION AND FOLLOW-UP:  At the time of discharge, the patient's pain  was markedly improved and under adequate control with  ibuprofen and  oral Dilaudid.  Additionally, his hemoglobin had been stable after  receiving 2 units of packed red blood cells two days prior to discharge.  He is scheduled for a hospital follow-up with Dr. Phillips Odor on April 2nd  at 3:15 p.m.  The patient was also asked to contact Dr. Joycelyn Man, his  hematologist at [facility], in order to schedule an appointment in the next 2-  4 weeks.  The patient should also be referred back to Dr. Luisa Hart or one  of his partners at [facility] for evaluation for possible  elective cholecystectomy.   PROCEDURES PERFORMED:  1. CT of the abdomen and pelvis with contrast:  This was performed on      January 31, 2007 secondary to significant abdominal tenderness and      rigidity.  No acute intra-abdominal findings were noted.  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  No      intrapelvic findings were noted either.  2. Abdominal ultrasound:  This was performed on February 01, 2007 and was      remarkable for a gallbladder filled with echogenic  sludge.  No      definite shadowing to suggest gallstones was seen.  No findings of      acute cholecystitis were present.  The common bile duct was found      to be of normal caliber.  The spleen appeared small.  3. A 2-view plain film x-ray of the right knee:  Normal alignment      without fractures or joint effusion were seen.  No acute findings      were present.  4. A 3-view x-ray plain film x-ray of the left hip:  This was      performed on February 04, 2007 and reveals no acute or specific      abnormality of the left hip.  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  End-plate      deformities in the lower lumbar spine consistent with sickle cell  disease were also commented upon.   CONSULTATIONS:  Dr. Harriette Bouillon, [facility].   BRIEF ADMITTING HISTORY AND PHYSICAL:  Mr. Cardiff is an 25 year old  man with a history of sickle cell disease and multiple sickle cell pain  crises as recently as February of 2008 who presented to the [facility]  Emergency Department with a 2-day history of severe pain in his chest,  ribs, back, and bilateral proximal lower extremities.  He reports that  he has been compliant with his hydroxyurea therapy and was last seen by  his hematologist, Dr. Joycelyn Man, in February of 2008.  At that time, he  was told that everything appeared to be well.  The patient was last seen  in the [facility] Emergency Department in early February for a pain  crisis but reported that his symptoms at that time were far less severe  than at the time of this presentation.  He denied any preceding cold  symptoms, nausea, vomiting, constipation, or diarrhea.  He stated that  he had been feeling well in general and had a normal appetite until his  pain symptoms began two days prior to presentation.   PHYSICAL EXAMINATION:  VITAL SIGNS:  Temperature 97.3, blood pressure  147/96, pulse 102, respirations 18,  oxygen saturation 96% on room air.  GENERAL:  The patient is a slender young man lying in bed comfortably in  no acute distress.  HEENT:  Pupils are constricted but equal, round, and reactive to light  and accommodation.  Oropharynx is dry, but no lesions are noted.  NECK:  Supple without lymphadenopathy.  RESPIRATORY:  Lungs are clear to auscultation bilaterally with good air  movement despite poor inspiratory effort.  CARDIOVASCULAR:  The patient has a mildly tachycardic rate but regular  rhythm without murmurs, rubs, or gallops.  He has 2+ radial, posterior  tibial, popliteal, and dorsalis pedis pulses bilaterally.  ABDOMEN:  Normoactive bowel sounds are present.  The abdomen is soft,  nontender, and nondistended.  EXTREMITIES:  No lower extremity edema is present.  SKIN:  Very dry but without obvious rashes or lesions.  MUSCULOSKELETAL:  The patient is able to move all 4 extremities with  full range of motion.  No effusions are identified.  NEUROLOGIC:  Cranial nerves II-XII are grossly intact.  He has 5/5  strength in his upper and lower extremities bilaterally.  PSYCHIATRIC:  The patient has appropriate mood and affect.   ADMISSION LABS:  White blood cell count is 14.1, hemoglobin 10.6,  hematocrit 31.2, platelets 431.  Reticulocytes 11.3%.  Sodium 136,  potassium 4.1, chloride 103, bicarbonate 28, BUN 9, creatinine 0.5,  glucose 117, total bilirubin 1.4, alkaline phosphatase 129, AST 32, ALT  24, total protein 8.2, albumin 4.1, calcium 9.4, magnesium 1.9.  Urine  drug screen positive for opiates and marijuana.  Urinalysis within  normal limits.   HOSPITAL COURSE:  1. Acute sickle pain crisis:  At the time of admission, the patient      was having significant pain in multiple joints, as well as in his      chest and back.  His symptoms were felt to be most consistent with      an acute pain crisis and accompanying bone infarcts.  A chest x-ray     obtained at that time did not  show any findings to suggest an acute      chest syndrome.  Additionally, the patient was breathing      comfortably with  a normal oxygen saturation.  He was admitted for      pain control and intravenous hydration.  Initially, this was      achieved with a Dilaudid PCA.  However, his pain continued to be      quite significant, and ketorolac was added.  He had reasonable      control of his pain with this but was not able to be transitioned      to an oral regimen for several days.  His pain eventually migrated      to his right knee which also appeared to be slightly swollen at one      point during the hospitalization.  However, further examination did      not reveal an effusion, and plain film x-rays were within normal      limits.  At the time of discharge, the patient's pain was primarily      localized to his left hip.  Films of this joint were also      unremarkable.  His white blood cell count, though moderately      elevated, remained relatively stable and had actually trended down      to 9.0 at the time of discharge.  It was felt that his leukocytosis      was secondary to stress from his acute pain crisis and not from an      infection.  However, because of his abdominal tenderness and      guarding on physical exam shortly after admission, he was started      on empiric antibiotic therapy for an intra-abdominal process with      metronidazole and ciprofloxacin.  General surgery was consulted and      reviewed the CT scan described above.  It was felt that the patient      was not a surgical candidate at that time since he had no obvious      abnormalities.  A right upper quadrant ultrasound was notable for      biliary sludge.  However, general surgery did not feel that      emergent cholecystectomy was needed at that time.  Nonetheless, the      patient should follow up with general surgery for possible elective      cholecystectomy in the future, since he is at increased  risk for      developing cholelithiasis secondary to sickle cell disease.  As      noted above, at the time of discharge, the patient's abdominal      tenderness had completely resolved, and his joint pains were well      controlled with a combination of oral Dilaudid and ibuprofen.  He      is to continue these as needed and to remain well-hydrated.  He      will be seen for follow-up by Dr. Phillips Odor in the outpatient clinic.  2. Sickle cell disease:  The patient has a history of sickle cell      disease with multiple pain crises despite being on hydroxyurea.  He      has been followed by Dr. Joycelyn Man at [facility] for quite some time for      management of his sickle cell disease.  He was continued on      hydroxyurea, and folic acid was added during this hospitalization.      He was asked to continue these medications after discharge and will  follow up with Dr. Joycelyn Man in the next 2-4 weeks. 3. Acute on chronic anemia:  Due to sickle cell disease, the patient      has a chronic anemia with a baseline hemoglobin of 9-10.      Initially, his hemoglobin was found to be slightly above this at      10.6.  However, it trended down somewhat, reaching a nadir of 7.4      two days prior to discharge.  Though he did not exhibit any      shortness of breath, chest pain, or neurological symptoms that      would warrant immediate transfusion, he was ultimately given 2      units of packed red blood cells because it was felt that this may      help his acute pain crisis by improving oxygenation.  He tolerated      this without incident, and his hemoglobin was stable at 10.4 at the      time of discharge.  Further monitoring as an outpatient is      recommended.  4. Gallbladder sludge:  As noted above, the patient was found to have      gallbladder sludge without obvious cholecystitis or cholelithiasis      on abdominal ultrasound.  However, because of the propensity for      patients with sickle  cell disease to form pigmented gallstones, a      prophylactic cholecystectomy should be considered at some point in      the future.  At the time of discharge, the patient did not have any      signs or symptoms to suggest hepatobiliary disease.  This will be      followed up when he returns to the clinic to see Dr. Phillips Odor on      April 2nd.   DISCHARGE LABS:  White blood cell count 9.0, hemoglobin 10.4, hematocrit  30.2, platelets 630, reticulocytes 9.1%.  Sodium 138, potassium 3.8,  chloride 103, bicarbonate 28, BUN 10, creatinine 0.6, glucose 109,  calcium 9.3.  Hepatitis B surface antigen negative.  Hepatitis B core  antibody negative.  Hepatitis A antibody negative.  Hepatitis C antibody  negative.   DISCHARGE VITAL SIGNS:  Temperature 98.3, blood pressure 102/59, pulse  73, respirations 16, oxygen saturation 97% on room air.      Yvonne Kendall, M.D.  Electronically Signed      Fransisco Hertz, M.D.  Electronically Signed    CE/MEDQ  D:  02/12/2007  T:  02/12/2007  Job:  [id number]   cc:   Edsel Petrin, D.O.  Thomas A. Cornett, M.D.  Dr. Sallyanne Kuster

## 2011-04-14 NOTE — Discharge Summary (Signed)
NAME:  Jerry [name], Jerry [name] NO.:  [id number]   MEDICAL RECORD NO.:  [id number]          PATIENT TYPE:  INP   LOCATION:  3014                         FACILITY:  [facility]   PHYSICIAN:  Isidor Holts, M.D.  DATE OF BIRTH:  12-22-1985   DATE OF ADMISSION:  09/24/2006  DATE OF DISCHARGE:  09/26/2006                                 DISCHARGE SUMMARY   PRIMARY CARE PHYSICIAN:  [facility].   DISCHARGE DIAGNOSES:  1. Sickle cell disease with pain crisis.  2. Anemia secondary to #1 above.  3. Smoking history.   DISCHARGE MEDICATIONS:  1. Folic acid 1 mg p.o. daily.  2. Hydroxyurea 500 mg p.o. t.i.d.  3. Ibuprofen 400 mg p.o. t.i.d. with food for 1 week.  4. Vicodin (5/325) one p.o. p.r.n. q.4-6 hours for pain, a total of 42      pills have been dispensed.   PROCEDURE:  Chest x-ray dated September 25, 2006, showed no acute  cardiopulmonary disease.   CONSULTATIONS:  None.   ADMISSION HISTORY:  As in H&P note of September 24, 2006, dictated by Elliot Cousin, M.D.  However, briefly, this is an 25 year old male, with known  history of sickle cell disease, currently under follow-up at [facility], who presented with 3-day history of upper  and lower extremity pains, consistent with previous pattern of sickle cell  disease.  He is a smoker and has had a mildly productive cough, but no  fever, no chest pain, no shortness of breath, no vomiting, or diarrhea.  He  was admitted for further evaluation, investigation, and management.   CLINICAL COURSE:  Problem 1.  Sickle cell crisis.  The patient was managed  with intravenous fluid hydration, analgesics, and folic acid  supplementation.  His hydroxyurea was continued. By September 26, 2006, a.m,  the patient was asymptomatic and keen to be discharged.  Of note, the  patient's cough has been productive of scanty clear phlegm over the past 2  days and chest  x-ray is clear.   Problem 2.  Anemia.  This is secondary to sickle cell disease.  At the time  of admission, the patient's hemoglobin was 10.6.  He has since had  intravenous fluid hydration.  Hemoglobin on the a.m. of September 26, 2006,  was 8.3 with hematocrit of 23.9.  I suspect that this is the patient's  baseline.  Interestingly, the patient had not been on folic acid  supplementation prior to admission.  He was subsequently discharged on this  medication.   Problem 3.  Smoking history.  The patient has been counseled appropriately.   DISPOSITION:  The patient was completely asymptomatic on September 26, 2006,  and has therefore been discharged accordingly.  He may return to regular  duties on October 01, 2006.   ACTIVITY:  As tolerated.   DIET:  No restrictions.   WOUND CARE:  Not applicable.   PAIN MANAGEMENT:  Refer to discharge medication list.   FOLLOWUP:  The patient is recommended to follow up with his M.D. at [facility], within 1-2 weeks.  He has been  instructed to call for an appointment.      Isidor Holts, M.D.  Electronically Signed     CO/MEDQ  D:  09/26/2006  T:  09/26/2006  Job:  [id number]

## 2011-09-05 LAB — CBC
HCT: 18.9 — ABNORMAL LOW
HCT: 21.8 — ABNORMAL LOW
HCT: 22 — ABNORMAL LOW
HCT: 23.9 — ABNORMAL LOW
HCT: 24.2 — ABNORMAL LOW
HCT: 25.6 — ABNORMAL LOW
Hemoglobin: 6.5 — CL
Hemoglobin: 7.4 — CL
Hemoglobin: 7.6 — CL
Hemoglobin: 8.1 — ABNORMAL LOW
Hemoglobin: 8.1 — ABNORMAL LOW
Hemoglobin: 8.5 — ABNORMAL LOW
Hemoglobin: 8.5 — ABNORMAL LOW
Hemoglobin: 8.8 — ABNORMAL LOW
MCHC: 33.8
MCHC: 34.2
MCHC: 34.4
MCHC: 34.5
MCHC: 35.5
MCV: 95.3
MCV: 95.6
Platelets: 304
Platelets: 321
Platelets: 332
Platelets: 333
RBC: 2.31 — ABNORMAL LOW
RBC: 2.43 — ABNORMAL LOW
RBC: 2.46 — ABNORMAL LOW
RBC: 2.58 — ABNORMAL LOW
RBC: 2.68 — ABNORMAL LOW
RBC: 2.75 — ABNORMAL LOW
RBC: 2.91 — ABNORMAL LOW
RDW: 21.2 — ABNORMAL HIGH
RDW: 21.5 — ABNORMAL HIGH
RDW: 21.9 — ABNORMAL HIGH
RDW: 22.3 — ABNORMAL HIGH
RDW: 23.2 — ABNORMAL HIGH
RDW: 23.2 — ABNORMAL HIGH
RDW: 23.5 — ABNORMAL HIGH
RDW: 23.9 — ABNORMAL HIGH
RDW: 25.1 — ABNORMAL HIGH
WBC: 10
WBC: 12.7 — ABNORMAL HIGH
WBC: 12.8 — ABNORMAL HIGH
WBC: 13.8 — ABNORMAL HIGH
WBC: 8.7
WBC: 9.8

## 2011-09-05 LAB — HEPATIC FUNCTION PANEL
AST: 72 — ABNORMAL HIGH
Alkaline Phosphatase: 124 — ABNORMAL HIGH
Bilirubin, Direct: 0.7 — ABNORMAL HIGH
Total Bilirubin: 2.5 — ABNORMAL HIGH

## 2011-09-05 LAB — HAPTOGLOBIN: Haptoglobin: <6 — ABNORMAL LOW

## 2011-09-05 LAB — BASIC METABOLIC PANEL
BUN: 6
BUN: 7
CO2: 29
CO2: 29
Calcium: 8.7
Calcium: 8.7
Calcium: 9
Calcium: 9
Calcium: 9.7
Creatinine, Ser: 0.59
Creatinine, Ser: 0.65
GFR calc Af Amer: >60
GFR calc Af Amer: >60
GFR calc Af Amer: >60
GFR calc non Af Amer: >60
GFR calc non Af Amer: >60
GFR calc non Af Amer: >60
GFR calc non Af Amer: >60
GFR calc non Af Amer: >60
Glucose, Bld: 90
Glucose, Bld: 90
Glucose, Bld: 97
Glucose, Bld: 97
Potassium: 3.8
Sodium: 135
Sodium: 138
Sodium: 139
Sodium: 143

## 2011-09-05 LAB — TSH: TSH: 2.553

## 2011-09-05 LAB — DIFFERENTIAL
Basophils Absolute: 0
Basophils Absolute: 0.1
Eosinophils Absolute: 0
Eosinophils Absolute: 0.3
Eosinophils Relative: 0
Eosinophils Relative: 2
Lymphs Abs: 3
Monocytes Absolute: 2.1 — ABNORMAL HIGH
Monocytes Absolute: 2.3 — ABNORMAL HIGH
Neutrophils Relative %: 43
Neutrophils Relative %: 65

## 2011-09-05 LAB — RETICULOCYTES
RBC.: 2.55 — ABNORMAL LOW
Retic Count, Absolute: 234.6 — ABNORMAL HIGH
Retic Ct Pct: 14.9 — ABNORMAL HIGH
Retic Ct Pct: 9.2 — ABNORMAL HIGH

## 2011-09-05 LAB — TYPE AND SCREEN
ABO/RH(D): B POS
ABO/RH(D): B POS
Antibody Screen: NEGATIVE

## 2011-09-05 LAB — COMPREHENSIVE METABOLIC PANEL
ALT: 18
ALT: 23
AST: 25
Albumin: 3.4 — ABNORMAL LOW
Alkaline Phosphatase: 83
Alkaline Phosphatase: 99
BUN: 10
BUN: 8
CO2: 27
CO2: 29
Chloride: 103
Chloride: 108
Creatinine, Ser: 0.61
GFR calc Af Amer: >60
GFR calc non Af Amer: >60
GFR calc non Af Amer: >60
Glucose, Bld: 87
Glucose, Bld: 91
Glucose, Bld: 97
Potassium: 3.6
Potassium: 3.8
Sodium: 137
Total Bilirubin: 2.4 — ABNORMAL HIGH
Total Bilirubin: 4.7 — ABNORMAL HIGH
Total Bilirubin: 4.8 — ABNORMAL HIGH
Total Protein: 6.4
Total Protein: 7.1

## 2011-09-05 LAB — URINALYSIS, ROUTINE W REFLEX MICROSCOPIC
Bilirubin Urine: NEGATIVE
Glucose, UA: NEGATIVE
Ketones, ur: NEGATIVE
Nitrite: NEGATIVE
Protein, ur: 30 — AB
Protein, ur: NEGATIVE
Specific Gravity, Urine: 1.009
Urobilinogen, UA: 1

## 2011-09-05 LAB — URINE MICROSCOPIC-ADD ON

## 2011-09-05 LAB — HIV ANTIBODY (ROUTINE TESTING W REFLEX): HIV: NONREACTIVE

## 2011-09-05 LAB — CULTURE, BLOOD (ROUTINE X 2)
Culture: NO GROWTH
Culture: NO GROWTH
Culture: NO GROWTH

## 2011-09-05 LAB — URINE CULTURE

## 2011-09-08 LAB — COMPREHENSIVE METABOLIC PANEL
ALT: 16
Alkaline Phosphatase: 132 — ABNORMAL HIGH
BUN: 5 — ABNORMAL LOW
BUN: 6
CO2: 28
CO2: 31
Chloride: 102
Chloride: 107
Creatinine, Ser: 0.49
GFR calc non Af Amer: >60
GFR calc non Af Amer: >60
Glucose, Bld: 101 — ABNORMAL HIGH
Potassium: 3.6
Total Bilirubin: 2 — ABNORMAL HIGH
Total Bilirubin: 3.1 — ABNORMAL HIGH

## 2011-09-08 LAB — CBC
HCT: 24.6 — ABNORMAL LOW
HCT: 25.5 — ABNORMAL LOW
HCT: 29.4 — ABNORMAL LOW
Hemoglobin: 10 — ABNORMAL LOW
Hemoglobin: 8.5 — ABNORMAL LOW
MCHC: 33.8
MCHC: 34
MCHC: 34.1
MCV: 94.7
MCV: 95
MCV: 96.1
Platelets: 477 — ABNORMAL HIGH
RBC: 2.64 — ABNORMAL LOW
RBC: 2.66 — ABNORMAL LOW
RDW: 24 — ABNORMAL HIGH
RDW: 24.7 — ABNORMAL HIGH
WBC: 12.5 — ABNORMAL HIGH
WBC: 14.3 — ABNORMAL HIGH

## 2011-09-08 LAB — DIFFERENTIAL
Basophils Absolute: 0
Basophils Relative: 0
Basophils Relative: 1
Eosinophils Absolute: 0
Lymphocytes Relative: 37
Lymphs Abs: 3.2
Monocytes Absolute: 1.8 — ABNORMAL HIGH
Monocytes Relative: 13 — ABNORMAL HIGH
Neutro Abs: 5.9
Neutrophils Relative %: 66

## 2011-09-08 LAB — BASIC METABOLIC PANEL
GFR calc Af Amer: >60
GFR calc non Af Amer: >60
Potassium: 4.2
Sodium: 142

## 2011-09-08 LAB — PROTIME-INR
INR: 1.1
Prothrombin Time: 14.4

## 2011-09-08 LAB — RETICULOCYTES: RBC.: 3.11 — ABNORMAL LOW

## 2016-12-27 ENCOUNTER — Encounter (HOSPITAL_COMMUNITY): Payer: Self-pay

## 2016-12-27 ENCOUNTER — Emergency Department (HOSPITAL_COMMUNITY): Payer: Medicaid Other

## 2016-12-27 ENCOUNTER — Emergency Department (HOSPITAL_COMMUNITY)
Admission: EM | Admit: 2016-12-27 | Discharge: 2016-12-28 | Disposition: A | Discharge status: Home / Self Care | Payer: Medicaid Other | Attending: Emergency Medicine | Admitting: Emergency Medicine

## 2016-12-27 DIAGNOSIS — D5700 Hb-SS disease with crisis, unspecified: Secondary | ICD-10-CM

## 2016-12-27 HISTORY — DX: Sickle-cell disease without crisis: D57.1

## 2016-12-27 LAB — URINALYSIS, ROUTINE W REFLEX MICROSCOPIC
Bilirubin Urine: NEGATIVE
Glucose, UA: NEGATIVE mg/dL
KETONES UR: NEGATIVE mg/dL
LEUKOCYTES UA: NEGATIVE
NITRITE: NEGATIVE
PROTEIN: 30 mg/dL — AB
Specific Gravity, Urine: 1.015 (ref 1.005–1.030)
pH: 5.5 (ref 5.0–8.0)

## 2016-12-27 LAB — CBC WITH DIFFERENTIAL/PLATELET
Basophils Absolute: 0.2 10*3/uL — ABNORMAL HIGH (ref 0.0–0.1)
Basophils Relative: 1 %
EOS ABS: 0 10*3/uL (ref 0.0–0.7)
Eosinophils Relative: 0 %
HCT: 30.2 % — ABNORMAL LOW (ref 39.0–52.0)
Hemoglobin: 10.5 g/dL — ABNORMAL LOW (ref 13.0–17.0)
LYMPHS PCT: 15 %
Lymphs Abs: 2.6 10*3/uL (ref 0.7–4.0)
MCH: 34.2 pg — ABNORMAL HIGH (ref 26.0–34.0)
MCHC: 34.8 g/dL (ref 30.0–36.0)
MCV: 98.4 fL (ref 78.0–100.0)
MONO ABS: 2.1 10*3/uL — AB (ref 0.1–1.0)
MONOS PCT: 12 %
Neutro Abs: 12.5 10*3/uL — ABNORMAL HIGH (ref 1.7–7.7)
Neutrophils Relative %: 72 %
PLATELETS: 340 10*3/uL (ref 150–400)
RBC: 3.07 MIL/uL — AB (ref 4.22–5.81)
RDW: 21.4 % — AB (ref 11.5–15.5)
WBC: 17.4 10*3/uL — AB (ref 4.0–10.5)
nRBC: 15 /100 WBC — ABNORMAL HIGH

## 2016-12-27 LAB — COMPREHENSIVE METABOLIC PANEL
ALT: 22 U/L (ref 17–63)
ANION GAP: 7 (ref 5–15)
AST: 48 U/L — ABNORMAL HIGH (ref 15–41)
Albumin: 4.3 g/dL (ref 3.5–5.0)
Alkaline Phosphatase: 70 U/L (ref 38–126)
BILIRUBIN TOTAL: 2 mg/dL — AB (ref 0.3–1.2)
BUN: 8 mg/dL (ref 6–20)
CALCIUM: 9.6 mg/dL (ref 8.9–10.3)
CO2: 27 mmol/L (ref 22–32)
Chloride: 104 mmol/L (ref 101–111)
Creatinine, Ser: 0.9 mg/dL (ref 0.61–1.24)
Glucose, Bld: 98 mg/dL (ref 65–99)
POTASSIUM: 4.2 mmol/L (ref 3.5–5.1)
Sodium: 138 mmol/L (ref 135–145)
TOTAL PROTEIN: 8.6 g/dL — AB (ref 6.5–8.1)

## 2016-12-27 LAB — RETICULOCYTES
RBC.: 3.07 MIL/uL — ABNORMAL LOW (ref 4.22–5.81)
Retic Ct Pct: >23 % — ABNORMAL HIGH (ref 0.4–3.1)

## 2016-12-27 LAB — URINALYSIS, MICROSCOPIC (REFLEX)
BACTERIA UA: NONE SEEN
Squamous Epithelial / HPF: NONE SEEN

## 2016-12-27 MED ORDER — HYDROMORPHONE HCL 2 MG/ML IJ SOLN: 0.5000 mg | INTRAMUSCULAR | Status: AC

## 2016-12-27 MED ORDER — HYDROMORPHONE HCL 2 MG/ML IJ SOLN: 2.0000 mg | INTRAMUSCULAR | Status: DC

## 2016-12-27 MED ORDER — HYDROMORPHONE HCL 2 MG/ML IJ SOLN
2.0000 mg | INTRAMUSCULAR | Status: AC
  Administered 2016-12-27: 2 mg via INTRAVENOUS
  Filled 2016-12-27: qty 1

## 2016-12-27 MED ORDER — DEXTROSE-NACL 5-0.45 % IV SOLN
INTRAVENOUS | Status: DC
  Administered 2016-12-27: 20:00:00 via INTRAVENOUS

## 2016-12-27 MED ORDER — HYDROMORPHONE HCL 2 MG/ML IJ SOLN
2.0000 mg | Freq: Once | INTRAMUSCULAR | Status: AC
  Administered 2016-12-28: 2 mg via INTRAVENOUS
  Filled 2016-12-27: qty 1

## 2016-12-27 MED ORDER — HYDROMORPHONE HCL 2 MG/ML IJ SOLN: 0.5000 mg | INTRAMUSCULAR | Status: DC

## 2016-12-27 MED ORDER — HYDROMORPHONE HCL 2 MG/ML IJ SOLN
0.5000 mg | INTRAMUSCULAR | Status: AC
  Administered 2016-12-27: 1 mg via INTRAVENOUS
  Filled 2016-12-27: qty 1

## 2016-12-27 MED ORDER — HYDROMORPHONE HCL 2 MG/ML IJ SOLN: 2.0000 mg | INTRAMUSCULAR | Status: AC

## 2016-12-27 MED ORDER — HYDROMORPHONE HCL 2 MG/ML IJ SOLN
0.5000 mg | INTRAMUSCULAR | Status: AC
Start: 2016-12-27 — End: 2016-12-27
  Administered 2016-12-27: 1 mg via INTRAVENOUS
  Filled 2016-12-27: qty 1

## 2016-12-27 MED ORDER — KETOROLAC TROMETHAMINE 30 MG/ML IJ SOLN
30.0000 mg | INTRAMUSCULAR | Status: AC
  Administered 2016-12-27: 30 mg via INTRAVENOUS
  Filled 2016-12-27: qty 1

## 2016-12-27 NOTE — ED Triage Notes (Signed)
Pt complaining of sickle cell crisis. Pt complaining of pain "from the waist up." Pt states worst episode yet. Pt states onset today. Pt a/o x 4 at triage, ambulatory, NAD.

## 2016-12-27 NOTE — ED Provider Notes (Signed)
[facility] DEPT Provider Note   CSN: [id number] Arrival date & time: 12/27/16  1709   History   Chief Complaint Chief Complaint  Patient presents with  . Sickle Cell Pain Crisis    HPI Jerry [name] is a 31 y.o. male who presents with pain. PMH significant for sickle cell disease. He states he was in jail for the past 9 years and was previously followed by a doctor in jail in [facility]. He states he has had this pain before. It is typical for his usual pain crises but has been more severe in nature. He is unsure of his baseline Hgb and when the last time he was transfused. He reports chest pain, abdominal pain, and back pain. Denies fever, chills, headache, dizziness, weakness, arm pain, SOB, leg pain, N/V. He takes Tylenol for pain as needed. Does not take chronic narcotics. He is supposed to be on Hydroxyurea and Folic acid but has not been on these medicines due to lack of insurance. He is working on Health visitor.    HPI  Past Medical History:  Diagnosis Date  . Sickle cell anemia [facility])     Patient Active Problem List   Diagnosis Date Noted  . SICKLE CELL ANEMIA 02/15/2007  . TOBACCO ABUSE 02/15/2007  . MARIJUANA ABUSE 02/15/2007  . 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 No pertinent surgical history.     Home Medications    Prior to Admission medications   Not on File    Family History History reviewed. No pertinent family history.  Social History Social History  Substance Use Topics  . Smoking status: Never Smoker  . Smokeless tobacco: Never Used  . Alcohol use No     Allergies   Patient has no allergy information on record.   Review of Systems Review of Systems  Constitutional: Negative for chills and fever.  Respiratory: Negative for shortness of breath.   Cardiovascular: Positive for chest pain.  Gastrointestinal: Positive for abdominal pain. Negative for diarrhea, nausea and vomiting.  Genitourinary: Negative  for dysuria.  Musculoskeletal: Positive for back pain and myalgias.  Neurological: Negative for dizziness, weakness and headaches.  All other systems reviewed and are negative.    Physical Exam Updated Vital Signs BP 124/75   Pulse 93   Temp 98.4 F (36.9 C)   Resp 16   SpO2 96%   Physical Exam  Constitutional: He is oriented to person, place, and time. He appears well-developed and well-nourished. No distress.  Calm, cooperative  HENT:  Head: Normocephalic and atraumatic.  Eyes: Conjunctivae are normal. Pupils are equal, round, and reactive to light. Right eye exhibits no discharge. Left eye exhibits no discharge. No scleral icterus.  Neck: Normal range of motion.  Cardiovascular: Normal rate and regular rhythm.  Exam reveals no gallop and no friction rub.   No murmur heard. Pulmonary/Chest: Effort normal and breath sounds normal. No respiratory distress. He has no wheezes. He has no rales. He exhibits tenderness (Generalized).  Abdominal: Soft. Bowel sounds are normal. He exhibits no distension and no mass. There is tenderness (generalized). There is no rebound and no guarding. No hernia.  Musculoskeletal:  Inspection: No masses, deformity, or rash Palpation: Generalized tenderness Strength: 5/5 in lower extremities and normal plantar and dorsiflexion Sensation: Intact sensation with light touch in lower extremities bilaterally Gait: Normal gait   Neurological: He is alert and oriented to person, place, and time.  Skin: Skin is warm and dry.  Psychiatric: He has a  normal mood and affect. His behavior is normal.  Nursing note and vitals reviewed.    ED Treatments / Results  Labs (all labs ordered are listed, but only abnormal results are displayed) Labs Reviewed  COMPREHENSIVE METABOLIC PANEL - Abnormal; Notable for the following:       Result Value   Total Protein 8.6 (*)    AST 48 (*)    Total Bilirubin 2.0 (*)    All other components within normal limits  CBC  WITH DIFFERENTIAL/PLATELET - Abnormal; Notable for the following:    WBC 17.4 (*)    RBC 3.07 (*)    Hemoglobin 10.5 (*)    HCT 30.2 (*)    MCH 34.2 (*)    RDW 21.4 (*)    nRBC 15 (*)    Neutro Abs 12.5 (*)    Monocytes Absolute 2.1 (*)    Basophils Absolute 0.2 (*)    All other components within normal limits  RETICULOCYTES - Abnormal; Notable for the following:    Retic Ct Pct >23.0 (*)    RBC. 3.07 (*)    All other components within normal limits  URINALYSIS, ROUTINE W REFLEX MICROSCOPIC - Abnormal; Notable for the following:    Hgb urine dipstick TRACE (*)    Protein, ur 30 (*)    All other components within normal limits  URINALYSIS, MICROSCOPIC (REFLEX)  LIPASE, BLOOD    EKG  EKG Interpretation None       Radiology Dg Chest 2 View  Result Date: 12/27/2016 CLINICAL DATA:  Chest pain.  Sickle cell disease. EXAM: CHEST  2 VIEW COMPARISON:  10/07/2007. FINDINGS: Minimal linear scarring or atelectasis in the right lung base. No airspace consolidation. No effusions. Normal pulmonary vasculature. Normal hilar and mediastinal contours. Unchanged upper normal heart size. Endplate impactions of multiple vertebral bodies with morphologic features typical of sickle cell disease. IMPRESSION: Minimal linear scarring or atelectasis in the lateral right base. No consolidation or effusion. Normal vasculature. Electronically Signed   By: Ellery Plunkaniel R Mitchell M.D.   On: 12/27/2016 21:06    Procedures Procedures (including critical care time)  Medications Ordered in ED Medications  dextrose 5 %-0.45 % sodium chloride infusion ( Intravenous New Bag/Given 12/27/16 2013)  ketorolac (TORADOL) 30 MG/ML injection 30 mg (30 mg Intravenous Given 12/27/16 2013)  HYDROmorphone (DILAUDID) injection 0.5-1 mg (1 mg Intravenous Given 12/27/16 2040)    Or  HYDROmorphone (DILAUDID) injection 0.5-1 mg ( Subcutaneous See Alternative 12/27/16 2040)  HYDROmorphone (DILAUDID) injection 0.5-1 mg (1 mg  Intravenous Given 12/27/16 2134)    Or  HYDROmorphone (DILAUDID) injection 0.5-1 mg ( Subcutaneous See Alternative 12/27/16 2134)  HYDROmorphone (DILAUDID) injection 2 mg (2 mg Intravenous Given 12/27/16 2345)    Or  HYDROmorphone (DILAUDID) injection 2 mg ( Subcutaneous See Alternative 12/27/16 2345)  HYDROmorphone (DILAUDID) injection 2 mg (2 mg Intravenous Given 12/28/16 0046)     Initial Impression / Assessment and Plan / ED Course  I have reviewed the triage vital signs and the nursing notes.  Pertinent labs & imaging results that were available during my care of the patient were reviewed by me and considered in my medical decision making (see chart for details).  [age] year old male presents with pain associated with sickle cell crisis. Patient is afebrile, not tachycardic or tachypneic, normotensive, and not hypoxic. Pain is 10/10. Will initiate sickle cell work up and ordered fluids and medicine for pain. Pt is not on chronic narcotics so will start with 1mg  Diluadid.  After Toradol and 1mg  Dilaudid pain is 7/10. CBC remarkable for 17.4. He is afebrile. CXR is negative. Abdomen is minimally tender with generalized tenderness. Hgb is 10.5. Suspect this is close to his baseline. CMP overall unremarkable. Rectic count is >23%. UA does not appear infected. He has had a total of 2mg  of Dialudid at this point. Will increase subsequent dose to 2mg .   After 3 rounds of pain medicine pain is 5/10. Still having pain. Will order last dose  He has received a total of 6mg  Dilaudid. Pain is controlled. He is comfortable with going home. Consult to CM placed to assist him with medicines and establishing care. Rx for Percocet, Hydroxurea, and Folic acid given. Vitals have remained overall normal throughout ED stay. Return precautions given.  Final Clinical Impressions(s) / ED Diagnoses   Final diagnoses:  Sickle cell pain crisis [facility])    New Prescriptions New Prescriptions   No medications on file      Bethel Born, PA-C 12/28/16 0104    Laurence Spates, MD 12/28/16 [phone]

## 2016-12-28 MED ORDER — FOLIC ACID 1 MG PO TABS: 1.0000 mg | ORAL_TABLET | Freq: Every day | ORAL | 0 refills | Status: DC

## 2016-12-28 MED ORDER — HYDROXYUREA 500 MG PO CAPS: 1500.0000 mg | ORAL_CAPSULE | Freq: Every day | ORAL | 0 refills | Status: DC

## 2016-12-28 MED ORDER — OXYCODONE-ACETAMINOPHEN 5-325 MG PO TABS: 1.0000 | ORAL_TABLET | Freq: Four times a day (QID) | ORAL | 0 refills | Status: DC | PRN

## 2016-12-28 NOTE — ED Notes (Signed)
Pt in ED stating he needs a work note for today since he was here late last night. Work note printed and given to patient.

## 2016-12-28 NOTE — Discharge Instructions (Addendum)
Please follow up with sickle cell clinic Return if symptoms are worsening A case manager has been contacted to assist you with affording your medicines

## 2016-12-29 MED FILL — FOLIC ACID 1 MG TABLET: 1 | 30 days supply | Qty: 30 | Fill #0

## 2017-01-09 ENCOUNTER — Ambulatory Visit (INDEPENDENT_AMBULATORY_CARE_PROVIDER_SITE_OTHER): Payer: Medicaid Other | Admitting: Internal Medicine

## 2017-01-09 VITALS — BP 109/49 | HR 71 | Temp 97.9°F | Resp 18 | Ht 71.0 in | Wt 188.8 lb

## 2017-01-09 DIAGNOSIS — Z652 Problems related to release from prison: Secondary | ICD-10-CM

## 2017-01-09 DIAGNOSIS — D571 Sickle-cell disease without crisis: Secondary | ICD-10-CM

## 2017-01-09 LAB — LIPID PANEL
CHOL/HDL RATIO: 2.8 ratio (ref <5.0)
Cholesterol: 130 mg/dL (ref <200)
HDL: 46 mg/dL (ref >40)
LDL CALC: 68 mg/dL (ref <100)
TRIGLYCERIDES: 81 mg/dL (ref <150)
VLDL: 16 mg/dL (ref <30)

## 2017-01-09 MED ORDER — IBUPROFEN 600 MG PO TABS: 600.0000 mg | ORAL_TABLET | Freq: Three times a day (TID) | ORAL | 3 refills | Status: DC | PRN

## 2017-01-09 MED ORDER — HYDROXYUREA 500 MG PO CAPS: 1500.0000 mg | ORAL_CAPSULE | Freq: Every day | ORAL | 3 refills | Status: DC

## 2017-01-09 MED ORDER — FOLIC ACID 1 MG PO TABS: 1.0000 mg | ORAL_TABLET | Freq: Every day | ORAL | 3 refills | Status: DC

## 2017-01-09 MED FILL — IBUPROFEN 600 MG TABLET: 600 | 20 days supply | Qty: 60 | Fill #0

## 2017-01-09 MED FILL — HYDROXYUREA 500 MG CAPSULE: 500 | 30 days supply | Qty: 90 | Fill #0

## 2017-01-09 NOTE — Patient Instructions (Signed)
Sickle Cell Anemia, Adult °Sickle cell anemia is a condition in which red blood cells have an abnormal “sickle” shape. This abnormal shape shortens the cells’ life span, which results in a lower than normal concentration of red blood cells in the blood. The sickle shape also causes the cells to clump together and block free blood flow through the blood vessels. As a result, the tissues and organs of the body do not receive enough oxygen. Sickle cell anemia causes organ damage and pain and increases the risk of infection. °What are the causes? °Sickle cell anemia is a genetic disorder. Those who receive two copies of the gene have the condition, and those who receive one copy have the trait. °What increases the risk? °The sickle cell gene is most common in people whose families originated in [place]. Other areas of the globe where sickle cell trait occurs include the Mediterranean, [place] and [place], the [place], and the [place]. °What are the signs or symptoms? °· Pain, especially in the extremities, back, chest, or abdomen (common). The pain may start suddenly or may develop following an illness, especially if there is dehydration. Pain can also occur due to overexertion or exposure to extreme temperature changes. °· Frequent severe bacterial infections, especially certain types of pneumonia and meningitis. °· Pain and swelling in the hands and feet. °· Decreased activity. °· Loss of appetite. °· Change in behavior. °· Headaches. °· Seizures. °· Shortness of breath or difficulty breathing. °· Vision changes. °· Skin ulcers. °Those with the trait may not have symptoms or they may have mild symptoms. °How is this diagnosed? °Sickle cell anemia is diagnosed with blood tests that demonstrate the genetic trait. It is often diagnosed during the newborn period, due to mandatory testing nationwide. A variety of blood tests, X-rays, CT scans, MRI scans, ultrasounds, and lung function tests may also be done to  monitor the condition. °How is this treated? °Sickle cell anemia may be treated with: °· Medicines. You may be given pain medicines, antibiotic medicines (to treat and prevent infections) or medicines to increase the production of certain types of hemoglobin. °· Fluids. °· Oxygen. °· Blood transfusions. ° °Follow these instructions at home: °· Drink enough fluid to keep your urine clear or pale yellow. Increase your fluid intake in hot weather and during exercise. °· Do not smoke. Smoking lowers oxygen levels in the blood. °· Only take over-the-counter or prescription medicines for pain, fever, or discomfort as directed by your health care provider. °· Take antibiotics as directed by your health care provider. Make sure you finish them it even if you start to feel better. °· Take supplements as directed by your health care provider. °· Consider wearing a medical alert bracelet. This tells anyone caring for you in an emergency of your condition. °· When traveling, keep your medical information, health care provider's names, and the medicines you take with you at all times. °· If you develop a fever, do not take medicines to reduce the fever right away. This could cover up a problem that is developing. Notify your health care provider. °· Keep all follow-up appointments with your health care provider. Sickle cell anemia requires regular medical care. °Contact a health care provider if: °You have a fever. °Get help right away if: °· You feel dizzy or faint. °· You have new abdominal pain, especially on the left side near the stomach area. °· You develop a persistent, often uncomfortable and painful penile erection (priapism). If this is not   treated immediately it will lead to impotence. °· You have numbness your arms or legs or you have a hard time moving them. °· You have a hard time with speech. °· You have a fever or persistent symptoms for more than 2-3 days. °· You have a fever and your symptoms suddenly get  worse. °· You have signs or symptoms of infection. These include: °? Chills. °? Abnormal tiredness (lethargy). °? Irritability. °? Poor eating. °? Vomiting. °· You develop pain that is not helped with medicine. °· You develop shortness of breath. °· You have pain in your chest. °· You are coughing up pus-like or bloody sputum. °· You develop a stiff neck. °· Your feet or hands swell or have pain. °· Your abdomen appears bloated. °· You develop joint pain. °This information is not intended to replace advice given to you by your health care provider. Make sure you discuss any questions you have with your health care provider. °Document Released: 02/21/2006 Document Revised: 06/02/2016 Document Reviewed: 06/25/2013 °Elsevier Interactive Patient Education © 2017 [employer] Inc. ° °

## 2017-01-09 NOTE — Progress Notes (Signed)
Jerry [name], is a 31 y.o. male  ZOX:[id number]  [id number]  DOB - 02/21/1986  CC:  Chief Complaint  Patient presents with  . Establish Care      HPI: Jerry Sinkarrance Balderston is a 31 y.o. male here today to establish medical care. Patient has history of Sickle Cell Disease, [facility]. His pain is typically controlled with tylenol and ibuprofen. He seldom has crisis, his last crisis was on 12/27/2016 when he went to the ED and was managed appropriately. He has no complaint today, no pain, no fever. He denies depression, no suicidal ideation. He is slowly integrating himself back into the community, currently looking for apartment and also trying to apply for insurance. Patient has No headache, No chest pain, No abdominal pain - No Nausea, No new weakness tingling or numbness, No Cough - SOB. He is supposed to be on Hydroxyurea and Folic acid but has not been on these medicines due to lack of insurance   Not on File Past Medical History:  Diagnosis Date  . Sickle cell anemia (HCC)    Current Outpatient Prescriptions on File Prior to Visit  Medication Sig Dispense Refill  . acetaminophen (TYLENOL) 500 MG tablet Take 1,000 mg by mouth every 6 (six) hours as needed for moderate pain.    Marland Kitchen. oxyCODONE-acetaminophen (PERCOCET/ROXICET) 5-325 MG tablet Take 1 tablet by mouth every 6 (six) hours as needed for severe pain. 15 tablet 0   No current facility-administered medications on file prior to visit.    No family history on file. Social History   Social History  . Marital status: Single    Spouse name: N/A  . Number of children: N/A  . Years of education: N/A   Occupational History  . Not on file.   Social History Main Topics  . Smoking status: Never Smoker  . Smokeless tobacco: Never Used  . Alcohol use No  . Drug use: No  . Sexual activity: Not on file   Other Topics Concern  . Not on file   Social History  Narrative  . No narrative on file    Review of Systems: Constitutional: Negative for fever, chills, diaphoresis, activity change, appetite change and fatigue. HENT: Negative for ear pain, nosebleeds, congestion, facial swelling, rhinorrhea, neck pain, neck stiffness and ear discharge.  Eyes: Negative for pain, discharge, redness, itching and visual disturbance. Respiratory: Negative for cough, choking, chest tightness, shortness of breath, wheezing and stridor.  Cardiovascular: Negative for chest pain, palpitations and leg swelling. Gastrointestinal: Negative for abdominal distention. Genitourinary: Negative for dysuria, urgency, frequency, hematuria, flank pain, decreased urine volume, difficulty urinating and dyspareunia.  Musculoskeletal: Negative for back pain, joint swelling, arthralgia and gait problem. Neurological: Negative for dizziness, tremors, seizures, syncope, facial asymmetry, speech difficulty, weakness, light-headedness, numbness and headaches.  Hematological: Negative for adenopathy. Does not bruise/bleed easily. Psychiatric/Behavioral: Negative for hallucinations, behavioral problems, confusion, dysphoric mood, decreased concentration and agitation.    Objective:   Vitals:   01/09/17 0954  BP: (!) 109/49  Pulse: 71  Resp: 18  Temp: 97.9 F (36.6 C)   Physical Exam: Constitutional: Patient appears well-developed and well-nourished. No distress. HENT: Normocephalic, atraumatic, External right and left ear normal. Oropharynx is clear and moist.  Eyes: Conjunctivae and EOM are normal. PERRLA, no scleral icterus. Neck: Normal ROM. Neck supple. No JVD. No tracheal deviation. No thyromegaly. CVS: RRR, S1/S2 +, no murmurs, no gallops, no carotid bruit.  Pulmonary: Effort and breath  sounds normal, no stridor, rhonchi, wheezes, rales.  Abdominal: Soft. BS +, no distension, tenderness, rebound or guarding.  Musculoskeletal: Normal range of motion. No edema and no  tenderness.  Lymphadenopathy: No lymphadenopathy noted, cervical, inguinal or axillary Neuro: Alert. Normal reflexes, muscle tone coordination. No cranial nerve deficit. Skin: Skin is warm and dry. No rash noted. Not diaphoretic. No erythema. No pallor. Psychiatric: Normal mood and affect. Behavior, judgment, thought content normal.  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  Dispense: 60 tablet; Refill: 3 - folic acid (FOLVITE) 1 MG tablet; Take 1 tablet (1 mg total) by mouth daily.  Dispense: 90 tablet; Refill: 3 - hydroxyurea (HYDREA) 500 MG capsule; Take 3 capsules (1,500 mg total) by mouth daily. May take with food to minimize GI side effects.  Dispense: 270 capsule; Refill: 3  - CBC with Differential/Platelet - VITAMIN D 25 Hydroxy (Vit-D Deficiency, Fractures) - Lipid panel - TSH - Hemoglobinopathy evaluation   Start Hydroxyurea as prescribed. We discussed the need for good hydration, monitoring of hydration status, avoidance of heat, cold, stress, and infection triggers. We discussed the risks and benefits of Hydrea, including bone marrow suppression, the possibility of GI upset, skin ulcers, hair thinning, and teratogenicity. The patient was reminded of the need to seek medical attention of any symptoms of bleeding, anemia, or infection. Continue folic acid 1 mg daily to prevent aplastic bone  marrow crises.   Pulmonary evaluation - Patient denies severe recurrent wheezes, shortness of breath with exercise, or persistent cough. If these symptoms develop, pulmonary function tests with spirometry will be ordered, and if abnormal, plan on referral to Pulmonology for further evaluation.  Eye - High risk of proliferative retinopathy. Annual eye exam with retinal exam recommended to patient, the patient has had eye exam this year.  Immunization status - Yearly influenza vaccination is recommended, as well as being up to date with Meningococcal and Pneumococcal vaccines.   2. Problems related to release from prison  - HIV antibody (with reflex) - Hepatitis panel, acute  Return in about 3 months (around 04/08/2017) for Sickle Cell Disease/Pain.  The patient was given clear instructions to go to ER or return to medical center if symptoms don't improve, worsen or new problems develop. The patient verbalized understanding. The patient was told to call to get lab results if they haven't heard anything in the next week.    This note has been created with Education officer, environmental. Any transcriptional errors are unintentional.    Jeanann Lewandowsky, MD, MHA, Maxwell Caul, CPE [facility] [place], [place] [phone]   01/09/2017, 10:56 AM

## 2017-01-09 NOTE — Progress Notes (Signed)
Patient is here to Establish CARE  Patient has eaten today. Patient has taken a vitamin today.  Patient denies pain at this time.

## 2017-01-10 LAB — HIV ANTIBODY (ROUTINE TESTING W REFLEX): HIV 1&2 Ab, 4th Generation: NONREACTIVE

## 2017-01-10 LAB — VITAMIN D 25 HYDROXY (VIT D DEFICIENCY, FRACTURES): Vit D, 25-Hydroxy: 16 ng/mL — ABNORMAL LOW (ref 30–100)

## 2017-01-10 LAB — CBC WITH DIFFERENTIAL/PLATELET
BASOS PCT: 0 %
Basophils Absolute: 0 cells/uL (ref 0–200)
EOS ABS: 63 {cells}/uL (ref 15–500)
Eosinophils Relative: 1 %
HEMATOCRIT: 30.3 % — AB (ref 38.5–50.0)
HEMOGLOBIN: 9.8 g/dL — AB (ref 13.2–17.1)
LYMPHS ABS: 2142 {cells}/uL (ref 850–3900)
Lymphocytes Relative: 34 %
MCH: 34.4 pg — ABNORMAL HIGH (ref 27.0–33.0)
MCHC: 32.3 g/dL (ref 32.0–36.0)
MCV: 106.3 fL — AB (ref 80.0–100.0)
MPV: 8.3 fL (ref 7.5–12.5)
Monocytes Absolute: 567 cells/uL (ref 200–950)
Monocytes Relative: 9 %
NEUTROS ABS: 3528 {cells}/uL (ref 1500–7800)
Neutrophils Relative %: 56 %
PLATELETS: 766 10*3/uL — AB (ref 140–400)
RBC: 2.85 MIL/uL — ABNORMAL LOW (ref 4.20–5.80)
RDW: 21.7 % — ABNORMAL HIGH (ref 11.0–15.0)
WBC: 6.3 10*3/uL (ref 3.8–10.8)

## 2017-01-10 LAB — HEPATITIS PANEL, ACUTE
HCV AB: NEGATIVE
Hep A IgM: NONREACTIVE
Hep B C IgM: NONREACTIVE
Hepatitis B Surface Ag: NEGATIVE

## 2017-01-10 LAB — TSH: TSH: 0.58 mIU/L (ref 0.40–4.50)

## 2017-01-12 LAB — HEMOGLOBINOPATHY EVALUATION
HEMATOCRIT: 32.1 % — AB (ref 38.5–50.0)
HEMOGLOBIN: 10.5 g/dL — AB (ref 13.2–17.1)
HGB A: 0 % — AB (ref >96.0)
HGB S QUANTITAION: 79.9 % — AB
Hgb A2 Quant: 3.2 % (ref 1.8–3.5)
Hgb F Quant: 16.9 % — ABNORMAL HIGH (ref <2.0)
MCH: 35.1 pg — ABNORMAL HIGH (ref 27.0–33.0)
MCV: 107.4 fL — ABNORMAL HIGH (ref 80.0–100.0)
RBC: 2.99 MIL/uL — AB (ref 4.20–5.80)
RDW: 21.2 % — ABNORMAL HIGH (ref 11.0–15.0)

## 2017-01-26 ENCOUNTER — Telehealth: Payer: Self-pay | Admitting: *Deleted

## 2017-01-26 ENCOUNTER — Other Ambulatory Visit: Payer: Self-pay | Admitting: Internal Medicine

## 2017-01-26 MED ORDER — VITAMIN D (ERGOCALCIFEROL) 1.25 MG (50000 UNIT) PO CAPS: 50000.0000 [IU] | ORAL_CAPSULE | ORAL | 0 refills | Status: DC

## 2017-01-26 NOTE — Telephone Encounter (Signed)
Medical Assistant left message on patient's home and cell voicemail. Voicemail states to give a call back to Wisdom Seybold with [facility] at [phone].  

## 2017-01-26 NOTE — Telephone Encounter (Signed)
-----   Message from Quentin Angstlugbemiga E Jegede, MD sent at 01/26/2017  9:56 AM EST ----- Vitamin D level is low, Hemoglobin is stable, HIV and Hepatitis are negative, other tests are within normal limits. Vitamin D supplement prescribed to the pharmacy for pick up.

## 2017-04-09 ENCOUNTER — Ambulatory Visit: Payer: Self-pay | Admitting: Family Medicine

## 2017-04-20 ENCOUNTER — Other Ambulatory Visit: Payer: Self-pay

## 2017-04-26 ENCOUNTER — Encounter: Payer: Self-pay | Admitting: Family Medicine

## 2017-04-26 ENCOUNTER — Ambulatory Visit (INDEPENDENT_AMBULATORY_CARE_PROVIDER_SITE_OTHER): Payer: Medicaid Other | Admitting: Family Medicine

## 2017-04-26 VITALS — BP 121/55 | HR 80 | Temp 98.5°F | Resp 16 | Ht 71.0 in | Wt 181.0 lb

## 2017-04-26 DIAGNOSIS — D571 Sickle-cell disease without crisis: Secondary | ICD-10-CM | POA: Diagnosis not present

## 2017-04-26 DIAGNOSIS — E559 Vitamin D deficiency, unspecified: Secondary | ICD-10-CM | POA: Diagnosis not present

## 2017-04-26 DIAGNOSIS — Z23 Encounter for immunization: Secondary | ICD-10-CM

## 2017-04-26 LAB — POCT URINALYSIS DIP (DEVICE)
Bilirubin Urine: NEGATIVE
GLUCOSE, UA: NEGATIVE mg/dL
Hgb urine dipstick: NEGATIVE
Ketones, ur: NEGATIVE mg/dL
Leukocytes, UA: NEGATIVE
Nitrite: NEGATIVE
PH: 6 (ref 5.0–8.0)
PROTEIN: 30 mg/dL — AB
Specific Gravity, Urine: 1.015 (ref 1.005–1.030)
UROBILINOGEN UA: 2 mg/dL — AB (ref 0.0–1.0)

## 2017-04-26 LAB — COMPLETE METABOLIC PANEL WITH GFR
ALBUMIN: 4.2 g/dL (ref 3.6–5.1)
ALT: 16 U/L (ref 9–46)
AST: 18 U/L (ref 10–40)
Alkaline Phosphatase: 70 U/L (ref 40–115)
BILIRUBIN TOTAL: 1 mg/dL (ref 0.2–1.2)
BUN: 13 mg/dL (ref 7–25)
CO2: 26 mmol/L (ref 20–31)
CREATININE: 0.84 mg/dL (ref 0.60–1.35)
Calcium: 9.5 mg/dL (ref 8.6–10.3)
Chloride: 104 mmol/L (ref 98–110)
GFR, Est Non African American: >89 mL/min (ref >=60)
GLUCOSE: 92 mg/dL (ref 65–99)
Potassium: 4.4 mmol/L (ref 3.5–5.3)
Sodium: 140 mmol/L (ref 135–146)
TOTAL PROTEIN: 8.1 g/dL (ref 6.1–8.1)

## 2017-04-26 LAB — VITAMIN D 25 HYDROXY (VIT D DEFICIENCY, FRACTURES): VIT D 25 HYDROXY: 14 ng/mL — AB (ref 30–100)

## 2017-04-26 MED ORDER — FOLIC ACID 1 MG PO TABS: 1.0000 mg | ORAL_TABLET | Freq: Every day | ORAL | 3 refills | Status: DC

## 2017-04-26 MED ORDER — VITAMIN D (ERGOCALCIFEROL) 1.25 MG (50000 UNIT) PO CAPS: 50000.0000 [IU] | ORAL_CAPSULE | ORAL | 0 refills | Status: DC

## 2017-04-26 MED ORDER — HYDROXYUREA 500 MG PO CAPS: 1500.0000 mg | ORAL_CAPSULE | Freq: Every day | ORAL | 3 refills | Status: DC

## 2017-04-26 MED ORDER — OXYCODONE-ACETAMINOPHEN 5-325 MG PO TABS: 1.0000 | ORAL_TABLET | Freq: Four times a day (QID) | ORAL | 0 refills | Status: DC | PRN

## 2017-04-26 MED FILL — HYDROXYUREA 500 MG CAPSULE: 500 | 30 days supply | Qty: 90 | Fill #0

## 2017-04-26 MED FILL — FOLIC ACID 1 MG TABLET: 1 | 30 days supply | Qty: 30 | Fill #0

## 2017-04-26 MED FILL — VIT D2 1.25 MG (50,000 UNIT: 1.25 MG | 28 days supply | Qty: 4 | Fill #0

## 2017-04-26 NOTE — Progress Notes (Signed)
Subjective:    Patient ID: Jerry [name], male    DOB: 01/15/1986, 31 y.o.   MRN: [id number]  HPI. Jerry [name], a 31 year old male with a history of sickle cell anemia, HbSS presents for a follow up. Patient says that sickle cell pain is typically controlled with Ibuprofen. He says that most recent crisis was 2 weeks ago. He attributes crisis to work stress and dehydration. He was also fasting for religious purposes and was not hydrating consistently. He says that he self-managed crisis with Ibuprofen and Percocet. He says that he has been unable to obtain refills on medications due to financial and  insurance constraints. Jerry [name] currently denies headache, fever, weakness, paresthesias, abdominal pain, nausea, vomiting, and diarrhea.  Past Medical History:  Diagnosis Date  . Sickle cell anemia (HCC)    Social History   Social History  . Marital status: Single    Spouse name: N/A  . Number of children: N/A  . Years of education: N/A   Occupational History  . Not on file.   Social History Main Topics  . Smoking status: Never Smoker  . Smokeless tobacco: Never Used  . Alcohol use No  . Drug use: No  . Sexual activity: Not on file   Other Topics Concern  . Not on file   Social History Narrative  . No narrative on file   No Known Allergies Immunization History  Administered Date(s) Administered  . Tdap 04/26/2017      Review of Systems  Constitutional: Negative.  Negative for fatigue.  HENT: Negative.   Eyes: Negative.  Negative for pain and redness.  Respiratory: Negative.   Cardiovascular: Negative.   Gastrointestinal: Negative.   Endocrine: Negative.  Negative for polydipsia, polyphagia and polyuria.  Genitourinary: Negative.   Musculoskeletal: Negative.   Skin: Negative.   Neurological: Negative.  Negative for tremors, seizures and weakness.  Hematological: Negative.   Psychiatric/Behavioral: Negative.        Objective:   Physical Exam   Constitutional: He is oriented to person, place, and time. He appears well-developed and well-nourished.  HENT:  Head: Normocephalic and atraumatic.  Right Ear: External ear normal.  Left Ear: External ear normal.  Mouth/Throat: Oropharynx is clear and moist.  Eyes: Conjunctivae and EOM are normal. Pupils are equal, round, and reactive to light.  Neck: Normal range of motion. Neck supple.  Cardiovascular: Normal rate, regular rhythm, normal heart sounds and intact distal pulses.   Musculoskeletal: Normal range of motion.  Neurological: He is alert and oriented to person, place, and time. He has normal reflexes.  Skin: Skin is warm and dry.  Psychiatric: He has a normal mood and affect. His behavior is normal. Judgment and thought content normal.         BP (!) 121/55 (BP Location: Left Arm, Patient Position: Sitting, Cuff Size: Large)   Pulse 80   Temp 98.5 F (36.9 C) (Oral)   Resp 16   Ht 5\' 11"  (1.803 m)   Wt 181 lb (82.1 kg)   SpO2 99%   BMI 25.24 kg/m   Assessment & Plan:   1. Hb-SS disease without crisis (HCC)  Sickle cell disease - Continue Hydrea 1500 mg. Notified [facility], they will be able to order medication for JerrySlatten. Prescription will be sent electronically. We discussed the need for good hydration, monitoring of hydration status, avoidance of heat, cold, stress, and infection triggers. We discussed the risks and benefits of Hydrea, including  bone marrow suppression, the possibility of GI upset, skin ulcers, hair thinning, and teratogenicity. The patient was reminded of the need to seek medical attention of any symptoms of bleeding, anemia, or infection. Continue folic acid 1 mg daily to prevent aplastic bone marrow crises.    Pulmonary evaluation - Patient denies severe recurrent wheezes, shortness of breath with exercise, or persistent cough. If these symptoms develop, pulmonary function tests with spirometry will be ordered, and if  abnormal, plan on referral to Pulmonology for further evaluation.   Cardiac - Routine screening for pulmonary hypertension is not recommended.   Eye - High risk of proliferative retinopathy. Annual eye exam with retinal exam recommended to patient. Will send a referral when insurance benefits are available  Immunization status - Saburo will receive Tdap today.    Acute and chronic painful episodes - We agreed on Percocet 5-325 every 6 hours as needed for moderate to severe pain. We discussed that pt is to receive his Schedule II prescriptions only from [place]. Pt is also aware that the prescription history is available to [place] online through the [facility] CSRS. Controlled substance agreement signed today. We reminded Dajion that all patients receiving Schedule II narcotics must be seen for follow within one month of prescription being requested. We reviewed the terms of our pain agreement, including the need to keep medicines in a safe locked location away from children or pets, and the need to report excess sedation or constipation, measures to avoid constipation, and policies related to early refills and stolen prescriptions. According to the [facility] program, we have reviewed details related to analgesia, adverse effects, aberrant behaviors. - folic acid (FOLVITE) 1 MG tablet; Take 1 tablet (1 mg total) by mouth daily.  Dispense: 90 tablet; Refill: 3 - EKG 12-Lead - CBC with Differential - Reticulocytes - COMPLETE METABOLIC PANEL WITH GFR - oxyCODONE-acetaminophen (PERCOCET/ROXICET) 5-325 MG tablet; Take 1 tablet by mouth every 6 (six) hours as needed for severe pain.  Dispense: 30 tablet; Refill: 0 - hydroxyurea (HYDREA) 500 MG capsule; Take 3 capsules (1,500 mg total) by mouth daily. May take with food to minimize GI side effects.  Dispense: 270 capsule; Refill: 3 - Pain Mgmt, Profile 8 w/Conf, U  2. Need for Tdap vaccination - Tdap vaccine greater than or equal to 7yo IM  3.  Vitamin D deficiency - Vitamin D, 25-hydroxy - Vitamin D, Ergocalciferol, (DRISDOL) 50000 units CAPS capsule; Take 1 capsule (50,000 Units total) by mouth every 7 (seven) days.  Dispense: 30 capsule; Refill: 0  Preventative maintenance:   Notified [facility] to assist patient in applying for state insurance. Marguarite, Child psychotherapist, will assist patient in identifying community resources.   Recommend pneumoccoccal 23 vaccination at 3 month follow up.  Recommend monthly self testicular exam.   RTC: 3 months for sickle cell anemia  Nolon Nations  MSN, FNP-C [facility] [address] [place], [place] [address] [phone]

## 2017-04-26 NOTE — Patient Instructions (Addendum)
Pain management:  Percocet 5-325 mg every 6 hours as needed for moderate to sever

## 2017-04-27 LAB — PAIN MGMT, PROFILE 8 W/CONF, U
6 ACETYLMORPHINE: NEGATIVE ng/mL (ref <10)
AMPHETAMINES: NEGATIVE ng/mL (ref <500)
Alcohol Metabolites: NEGATIVE ng/mL (ref <500)
BUPRENORPHINE: NEGATIVE ng/mL (ref <5)
Benzodiazepines: NEGATIVE ng/mL (ref <100)
COCAINE METABOLITE: NEGATIVE ng/mL (ref <150)
Creatinine: 98.1 mg/dL (ref >=20.0)
MDMA: NEGATIVE ng/mL (ref <500)
Marijuana Metabolite: NEGATIVE ng/mL (ref <20)
OXYCODONE: NEGATIVE ng/mL (ref <100)
Opiates: NEGATIVE ng/mL (ref <100)
Oxidant: NEGATIVE ug/mL (ref <200)
Please note:: 0
pH: 5.8 (ref 4.5–9.0)

## 2017-04-27 LAB — RETICULOCYTES
ABS RETIC: 216200 {cells}/uL — AB (ref 25000–90000)
RBC.: 2.35 MIL/uL — ABNORMAL LOW (ref 4.20–5.80)
Retic Ct Pct: 9.2 %

## 2017-04-27 LAB — CBC WITH DIFFERENTIAL/PLATELET
BASOS PCT: 0 %
Basophils Absolute: 0 cells/uL (ref 0–200)
Eosinophils Absolute: 56 cells/uL (ref 15–500)
Eosinophils Relative: 1 %
HEMATOCRIT: 27.6 % — AB (ref 38.5–50.0)
Hemoglobin: 9 g/dL — ABNORMAL LOW (ref 13.2–17.1)
LYMPHS PCT: 43 %
Lymphs Abs: 2408 cells/uL (ref 850–3900)
MCH: 38.3 pg — AB (ref 27.0–33.0)
MCHC: 32.6 g/dL (ref 32.0–36.0)
MCV: 107.4 fL — ABNORMAL HIGH (ref 80.0–100.0)
MONO ABS: 728 {cells}/uL (ref 200–950)
MPV: 9.1 fL (ref 7.5–12.5)
Monocytes Relative: 13 %
NEUTROS PCT: 43 %
Neutro Abs: 2408 cells/uL (ref 1500–7800)
Platelets: 735 10*3/uL — ABNORMAL HIGH (ref 140–400)
RBC: 2.35 MIL/uL — ABNORMAL LOW (ref 4.20–5.80)
RDW: 18.9 % — AB (ref 11.0–15.0)
WBC: 5.6 10*3/uL (ref 3.8–10.8)

## 2017-05-03 MED FILL — OXYCODONE-ACETAMINOPHEN 5-3: 5-325 | 7 days supply | Qty: 30 | Fill #0

## 2017-06-25 MED FILL — FOLIC ACID 1 MG TABLET: 1 | 30 days supply | Qty: 30 | Fill #1 | Status: TO

## 2017-06-25 MED FILL — HYDROXYUREA 500 MG CAPSULE: 500 | 30 days supply | Qty: 90 | Fill #1 | Status: TO

## 2017-07-27 ENCOUNTER — Ambulatory Visit: Payer: Self-pay | Admitting: Family Medicine

## 2017-10-10 ENCOUNTER — Emergency Department (HOSPITAL_COMMUNITY): Payer: Medicaid Other

## 2017-10-10 ENCOUNTER — Encounter (HOSPITAL_COMMUNITY): Payer: Self-pay

## 2017-10-10 ENCOUNTER — Emergency Department (HOSPITAL_COMMUNITY)
Admission: EM | Admit: 2017-10-10 | Discharge: 2017-10-10 | Disposition: A | Discharge status: Home / Self Care | Payer: Medicaid Other | Attending: Emergency Medicine | Admitting: Emergency Medicine

## 2017-10-10 DIAGNOSIS — Z79899 Other long term (current) drug therapy: Secondary | ICD-10-CM | POA: Diagnosis not present

## 2017-10-10 DIAGNOSIS — D571 Sickle-cell disease without crisis: Secondary | ICD-10-CM | POA: Insufficient documentation

## 2017-10-10 DIAGNOSIS — J181 Lobar pneumonia, unspecified organism: Secondary | ICD-10-CM

## 2017-10-10 DIAGNOSIS — J189 Pneumonia, unspecified organism: Secondary | ICD-10-CM

## 2017-10-10 DIAGNOSIS — R05 Cough: Secondary | ICD-10-CM | POA: Diagnosis present

## 2017-10-10 LAB — CBC WITH DIFFERENTIAL/PLATELET
BASOS PCT: 0 %
Band Neutrophils: 0 %
Basophils Absolute: 0 10*3/uL (ref 0.0–0.1)
Blasts: 0 %
EOS ABS: 0 10*3/uL (ref 0.0–0.7)
EOS PCT: 0 %
HCT: 26 % — ABNORMAL LOW (ref 39.0–52.0)
Hemoglobin: 9 g/dL — ABNORMAL LOW (ref 13.0–17.0)
LYMPHS PCT: 20 %
Lymphs Abs: 2.7 10*3/uL (ref 0.7–4.0)
MCH: 34.4 pg — ABNORMAL HIGH (ref 26.0–34.0)
MCHC: 34.6 g/dL (ref 30.0–36.0)
MCV: 99.2 fL (ref 78.0–100.0)
MONO ABS: 0.7 10*3/uL (ref 0.1–1.0)
Metamyelocytes Relative: 0 %
Monocytes Relative: 5 %
Myelocytes: 0 %
NEUTROS ABS: 10.2 10*3/uL — AB (ref 1.7–7.7)
NEUTROS PCT: 75 %
NRBC: 8 /100{WBCs} — AB
Platelets: 511 10*3/uL — ABNORMAL HIGH (ref 150–400)
Promyelocytes Absolute: 0 %
RBC: 2.62 MIL/uL — ABNORMAL LOW (ref 4.22–5.81)
RDW: 17.7 % — AB (ref 11.5–15.5)
WBC: 13.6 10*3/uL — ABNORMAL HIGH (ref 4.0–10.5)

## 2017-10-10 LAB — COMPREHENSIVE METABOLIC PANEL
ALBUMIN: 4.2 g/dL (ref 3.5–5.0)
ALK PHOS: 85 U/L (ref 38–126)
ALT: 21 U/L (ref 17–63)
AST: 24 U/L (ref 15–41)
Anion gap: 6 (ref 5–15)
BILIRUBIN TOTAL: 2.1 mg/dL — AB (ref 0.3–1.2)
BUN: 11 mg/dL (ref 6–20)
CALCIUM: 9.4 mg/dL (ref 8.9–10.3)
CO2: 29 mmol/L (ref 22–32)
Chloride: 101 mmol/L (ref 101–111)
Creatinine, Ser: 0.98 mg/dL (ref 0.61–1.24)
GFR calc Af Amer: >60 mL/min (ref >60)
GFR calc non Af Amer: >60 mL/min (ref >60)
GLUCOSE: 99 mg/dL (ref 65–99)
Potassium: 4.3 mmol/L (ref 3.5–5.1)
Sodium: 136 mmol/L (ref 135–145)
TOTAL PROTEIN: 9 g/dL — AB (ref 6.5–8.1)

## 2017-10-10 LAB — RETICULOCYTES
RBC.: 2.62 MIL/uL — ABNORMAL LOW (ref 4.22–5.81)
Retic Count, Absolute: 280.3 10*3/uL — ABNORMAL HIGH (ref 19.0–186.0)
Retic Ct Pct: 10.7 % — ABNORMAL HIGH (ref 0.4–3.1)

## 2017-10-10 MED ORDER — SODIUM CHLORIDE 0.9 % IV BOLUS (SEPSIS)
1000.0000 mL | Freq: Once | INTRAVENOUS | Status: AC
  Administered 2017-10-10: 1000 mL via INTRAVENOUS

## 2017-10-10 MED ORDER — MORPHINE SULFATE (PF) 4 MG/ML IV SOLN
6.0000 mg | Freq: Once | INTRAVENOUS | Status: AC
  Administered 2017-10-10: 6 mg via INTRAVENOUS
  Filled 2017-10-10: qty 2

## 2017-10-10 MED ORDER — CEPHALEXIN 250 MG PO CAPS
1000.0000 mg | ORAL_CAPSULE | Freq: Once | ORAL | Status: AC
  Administered 2017-10-10: 1000 mg via ORAL
  Filled 2017-10-10: qty 4

## 2017-10-10 MED ORDER — ONDANSETRON HCL 4 MG/2ML IJ SOLN
4.0000 mg | Freq: Once | INTRAMUSCULAR | Status: AC
Start: 2017-10-10 — End: 2017-10-10
  Administered 2017-10-10: 4 mg via INTRAVENOUS
  Filled 2017-10-10: qty 2

## 2017-10-10 MED ORDER — DOXYCYCLINE HYCLATE 100 MG PO CAPS: 100.0000 mg | ORAL_CAPSULE | Freq: Two times a day (BID) | ORAL | 0 refills | Status: DC

## 2017-10-10 MED ORDER — ACETAMINOPHEN-CODEINE 120-12 MG/5ML PO SOLN: 10.0000 mL | ORAL | 0 refills | Status: DC | PRN

## 2017-10-10 MED ORDER — CEPHALEXIN 500 MG PO CAPS: 1000.0000 mg | ORAL_CAPSULE | Freq: Two times a day (BID) | ORAL | 0 refills | Status: DC

## 2017-10-10 MED ORDER — AEROCHAMBER PLUS FLO-VU MEDIUM MISC
1.0000 | Freq: Once | Status: DC
  Filled 2017-10-10: qty 1

## 2017-10-10 MED ORDER — DOXYCYCLINE HYCLATE 100 MG PO TABS: 100.0000 mg | ORAL_TABLET | Freq: Once | ORAL | Status: DC

## 2017-10-10 MED ORDER — IOPAMIDOL (ISOVUE-370) INJECTION 76%
INTRAVENOUS | Status: AC
  Administered 2017-10-10: 100 mL
  Filled 2017-10-10: qty 100

## 2017-10-10 MED ORDER — GUAIFENESIN ER 1200 MG PO TB12
1.0000 | ORAL_TABLET | Freq: Two times a day (BID) | ORAL | 0 refills | Status: DC
Start: 2017-10-10 — End: 2018-02-19

## 2017-10-10 MED ORDER — ALBUTEROL SULFATE HFA 108 (90 BASE) MCG/ACT IN AERS
2.0000 | INHALATION_SPRAY | Freq: Four times a day (QID) | RESPIRATORY_TRACT | Status: DC
  Administered 2017-10-10: 2 via RESPIRATORY_TRACT
  Filled 2017-10-10: qty 6.7

## 2017-10-10 NOTE — ED Notes (Addendum)
Pt back from CT.  Had episode of vomiting in CT.  States emesis and coughing has just increased his pain.

## 2017-10-10 NOTE — ED Notes (Signed)
Patient transported to CT 

## 2017-10-10 NOTE — ED Triage Notes (Addendum)
Pt reports he has had cold x several weeks that he has not been able to get rid of. Pt reports over the last couple days he has experienced productive cough with yellow/green sputum that is blood tinged at times. PT endorses back and rib pain worse with coughing. PT has hx of sickle cell anemia

## 2017-10-10 NOTE — Discharge Instructions (Signed)
Return here as needed. Follow up with your primary doctor for a recheck.  °

## 2017-10-15 NOTE — ED Provider Notes (Signed)
[facility] Provider Note   CSN: [id number] Arrival date & time: 10/10/17  1127     History   Chief Complaint Chief Complaint  Patient presents with  . Cough  . Nasal Congestion    HPI Jerry [name] is a 31 y.o. male.  HPI Patient presents to the emergency department with cough and nasal congestion over the last 2 weeks.  The patient states that he also has back pain and rib pain when coughing.  Patient states he did not take any medications prior to arrival.  The patient denies chest pain, shortness of breath, headache,blurred vision, neck pain, fever, weakness, numbness, dizziness, anorexia, edema, abdominal pain, nausea, vomiting, diarrhea, rash, back pain, dysuria, hematemesis, bloody stool, near syncope, or syncope. Past Medical History:  Diagnosis Date  . Sickle cell anemia [facility](HCC)     Patient Active Problem List   Diagnosis Date Noted  . Vitamin D deficiency 04/26/2017  . Problems related to release from prison 01/09/2017  . 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 SICKLE CELL ANEMIA 02/15/2007  . TOBACCO ABUSE 02/15/2007  . MARIJUANA ABUSE 02/15/2007  . 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 No pertinent surgical history.     Home Medications    Prior to Admission medications   Medication Sig Start Date End Date Taking? Authorizing Provider  acetaminophen (TYLENOL) 500 MG tablet Take 1,000 mg by mouth every 6 (six) hours as needed for moderate pain.    [provider]  acetaminophen-codeine 120-12 MG/5ML solution Take 10 mLs every 4 (four) hours as needed by mouth for moderate pain. 10/10/17   Holley Kocurek, Cristal Deerhristopher, PA-C  cephALEXin (KEFLEX) 500 MG capsule Take 2 capsules (1,000 mg total) 2 (two) times daily by mouth. 10/10/17   Keionna Kinnaird, Cristal Deerhristopher, PA-C  doxycycline (VIBRAMYCIN) 100 MG capsule Take 1 capsule (100 mg total) 2 (two) times daily by mouth. 10/10/17   Shalik Sanfilippo, Cristal Deerhristopher,  PA-C  folic acid (FOLVITE) 1 MG tablet Take 1 tablet (1 mg total) by mouth daily. 04/26/17   Massie MaroonHollis, Lachina M, FNP  Guaifenesin 1200 MG TB12 Take 1 tablet (1,200 mg total) 2 (two) times daily by mouth. 10/10/17   Anadalay Macdonell, Cristal Deerhristopher, PA-C  hydroxyurea (HYDREA) 500 MG capsule Take 3 capsules (1,500 mg total) by mouth daily. May take with food to minimize GI side effects. 04/26/17   Massie MaroonHollis, Lachina M, FNP  ibuprofen (ADVIL,MOTRIN) 600 MG tablet Take 1 tablet (600 mg total) by mouth every 8 (eight) hours as needed. 01/09/17   Quentin AngstJegede, Olugbemiga E, MD  oxyCODONE-acetaminophen (PERCOCET/ROXICET) 5-325 MG tablet Take 1 tablet by mouth every 6 (six) hours as needed for severe pain. 04/26/17   Massie MaroonHollis, Lachina M, FNP  Vitamin D, Ergocalciferol, (DRISDOL) 50000 units CAPS capsule Take 1 capsule (50,000 Units total) by mouth every 7 (seven) days. 04/26/17   Massie MaroonHollis, Lachina M, FNP    Family History No family history on file.  Social History Social History   Tobacco Use  . Smoking status: Never Smoker  . Smokeless tobacco: Never Used  Substance Use Topics  . Alcohol use: No  . Drug use: No     Allergies   Patient has no known allergies.   Review of Systems Review of Systems All other systems negative except as documented in the HPI. All pertinent positives and negatives as reviewed in the HPI.  Physical Exam Updated Vital Signs BP 135/77 (BP Location: Right Arm)   Pulse 89   Temp 99.3 F (  37.4 C) (Oral)   Resp 20   SpO2 98%   Physical Exam  Constitutional: He is oriented to person, place, and time. He appears well-developed and well-nourished. No distress.  HENT:  Head: Normocephalic and atraumatic.  Mouth/Throat: Oropharynx is clear and moist. No oropharyngeal exudate.  Eyes: Pupils are equal, round, and reactive to light.  Neck: Normal range of motion. Neck supple.  Cardiovascular: Normal rate, regular rhythm and normal heart sounds. Exam reveals no gallop and no friction rub.  No  murmur heard. Pulmonary/Chest: Effort normal and breath sounds normal. No respiratory distress. He has no wheezes. He has no rales.  Abdominal: Soft. Bowel sounds are normal. He exhibits no distension. There is no tenderness.  Musculoskeletal: Normal range of motion.  Neurological: He is alert and oriented to person, place, and time. He exhibits normal muscle tone. Coordination normal.  Skin: Skin is warm and dry. Capillary refill takes less than 2 seconds. No rash noted. No erythema.  Psychiatric: He has a normal mood and affect. His behavior is normal.  Nursing note and vitals reviewed.    ED Treatments / Results  Labs (all labs ordered are listed, but only abnormal results are displayed) Labs Reviewed  CBC WITH DIFFERENTIAL/PLATELET - Abnormal; Notable for the following components:      Result Value   WBC 13.6 (*)    RBC 2.62 (*)    Hemoglobin 9.0 (*)    HCT 26.0 (*)    MCH 34.4 (*)    RDW 17.7 (*)    Platelets 511 (*)    nRBC 8 (*)    Neutro Abs 10.2 (*)    All other components within normal limits  COMPREHENSIVE METABOLIC PANEL - Abnormal; Notable for the following components:   Total Protein 9.0 (*)    Total Bilirubin 2.1 (*)    All other components within normal limits  RETICULOCYTES - Abnormal; Notable for the following components:   Retic Ct Pct 10.7 (*)    RBC. 2.62 (*)    Retic Count, Absolute 280.3 (*)    All other components within normal limits    EKG  EKG Interpretation None       Radiology No results found.  Procedures Procedures (including critical care time)  Medications Ordered in ED Medications  sodium chloride 0.9 % bolus 1,000 mL (0 mLs Intravenous Stopped 10/10/17 1602)  morphine 4 MG/ML injection 6 mg (6 mg Intravenous Given 10/10/17 1430)  ondansetron (ZOFRAN) injection 4 mg (4 mg Intravenous Given 10/10/17 1431)  iopamidol (ISOVUE-370) 76 % injection (100 mLs  Contrast Given 10/10/17 1504)  cephALEXin (KEFLEX) capsule 1,000 mg (1,000  mg Oral Given 10/10/17 1601)     Initial Impression / Assessment and Plan / ED Course  I have reviewed the triage vital signs and the nursing notes.  Pertinent labs & imaging results that were available during my care of the patient were reviewed by me and considered in my medical decision making (see chart for details).     Patient will be treated for URI symptoms told to return here as needed did give him antibiotics due to the fact that he does have sickle cell disease.  I advised him to follow-up with his primary doctor.  Told return here as needed.  CTA of his chest just as the mild right middle lobe pneumonia.  Final Clinical Impressions(s) / ED Diagnoses   Final diagnoses:  Community acquired pneumonia of right middle lobe of lung [employer])    ED Discharge  Orders        Ordered    doxycycline (VIBRAMYCIN) 100 MG capsule  2 times daily     10/10/17 1539    cephALEXin (KEFLEX) 500 MG capsule  2 times daily     10/10/17 1539    Guaifenesin 1200 MG TB12  2 times daily     10/10/17 1539    acetaminophen-codeine 120-12 MG/5ML solution  Every 4 hours PRN     10/10/17 1539       Belvie Iribe, Cristal DeerChristopher, PA-C 10/15/17 1632    Charlynne PanderYao, David Hsienta, MD 10/17/17 [phone]

## 2017-11-11 ENCOUNTER — Other Ambulatory Visit: Payer: Self-pay

## 2017-11-11 ENCOUNTER — Emergency Department (HOSPITAL_COMMUNITY): Payer: Medicaid Other

## 2017-11-11 ENCOUNTER — Inpatient Hospital Stay (HOSPITAL_COMMUNITY)
Admission: EM | Admit: 2017-11-11 | Discharge: 2017-11-16 | DRG: 812 | Disposition: A | Discharge status: Home / Self Care | Payer: Medicaid Other | Attending: Internal Medicine | Admitting: Internal Medicine

## 2017-11-11 ENCOUNTER — Encounter (HOSPITAL_COMMUNITY): Payer: Self-pay | Admitting: Emergency Medicine

## 2017-11-11 DIAGNOSIS — D72829 Elevated white blood cell count, unspecified: Secondary | ICD-10-CM | POA: Diagnosis present

## 2017-11-11 DIAGNOSIS — D57219 Sickle-cell/Hb-C disease with crisis, unspecified: Secondary | ICD-10-CM

## 2017-11-11 DIAGNOSIS — D638 Anemia in other chronic diseases classified elsewhere: Secondary | ICD-10-CM | POA: Diagnosis present

## 2017-11-11 DIAGNOSIS — D5700 Hb-SS disease with crisis, unspecified: Principal | ICD-10-CM | POA: Diagnosis present

## 2017-11-11 DIAGNOSIS — D571 Sickle-cell disease without crisis: Secondary | ICD-10-CM

## 2017-11-11 LAB — COMPREHENSIVE METABOLIC PANEL
ALBUMIN: 4.5 g/dL (ref 3.5–5.0)
ALK PHOS: 90 U/L (ref 38–126)
ALT: 24 U/L (ref 17–63)
AST: 38 U/L (ref 15–41)
Anion gap: 9 (ref 5–15)
BILIRUBIN TOTAL: 2.4 mg/dL — AB (ref 0.3–1.2)
BUN: 9 mg/dL (ref 6–20)
CALCIUM: 9.3 mg/dL (ref 8.9–10.3)
CO2: 23 mmol/L (ref 22–32)
CREATININE: 0.97 mg/dL (ref 0.61–1.24)
Chloride: 106 mmol/L (ref 101–111)
GFR calc Af Amer: >60 mL/min (ref >60)
GLUCOSE: 104 mg/dL — AB (ref 65–99)
Potassium: 3.8 mmol/L (ref 3.5–5.1)
Sodium: 138 mmol/L (ref 135–145)
TOTAL PROTEIN: 8.1 g/dL (ref 6.5–8.1)

## 2017-11-11 LAB — CBC WITH DIFFERENTIAL/PLATELET
BASOS ABS: 0.1 10*3/uL (ref 0.0–0.1)
BASOS PCT: 0 %
EOS ABS: 0 10*3/uL (ref 0.0–0.7)
EOS PCT: 0 %
HCT: 29.6 % — ABNORMAL LOW (ref 39.0–52.0)
Hemoglobin: 10.3 g/dL — ABNORMAL LOW (ref 13.0–17.0)
LYMPHS PCT: 18 %
Lymphs Abs: 2.2 10*3/uL (ref 0.7–4.0)
MCH: 34 pg (ref 26.0–34.0)
MCHC: 34.8 g/dL (ref 30.0–36.0)
MCV: 97.7 fL (ref 78.0–100.0)
MONO ABS: 0.9 10*3/uL (ref 0.1–1.0)
Monocytes Relative: 8 %
Neutro Abs: 9 10*3/uL — ABNORMAL HIGH (ref 1.7–7.7)
Neutrophils Relative %: 74 %
PLATELETS: 282 10*3/uL (ref 150–400)
RBC: 3.03 MIL/uL — AB (ref 4.22–5.81)
RDW: 19.7 % — ABNORMAL HIGH (ref 11.5–15.5)
WBC: 12.1 10*3/uL — AB (ref 4.0–10.5)

## 2017-11-11 LAB — RETICULOCYTES
RBC.: 3.03 MIL/uL — ABNORMAL LOW (ref 4.22–5.81)
RETIC COUNT ABSOLUTE: 466.6 10*3/uL — AB (ref 19.0–186.0)
RETIC CT PCT: 15.4 % — AB (ref 0.4–3.1)

## 2017-11-11 LAB — LIPASE, BLOOD: LIPASE: 23 U/L (ref 11–51)

## 2017-11-11 MED ORDER — HYDROMORPHONE HCL 1 MG/ML IJ SOLN: 1.0000 mg | INTRAMUSCULAR | Status: AC

## 2017-11-11 MED ORDER — OXYCODONE HCL 5 MG PO TABS: 5.0000 mg | ORAL_TABLET | ORAL | 0 refills | Status: DC | PRN

## 2017-11-11 MED ORDER — LACTATED RINGERS IV BOLUS (SEPSIS)
1000.0000 mL | Freq: Once | INTRAVENOUS | Status: AC
  Administered 2017-11-11: 1000 mL via INTRAVENOUS

## 2017-11-11 MED ORDER — HYDROMORPHONE HCL 1 MG/ML IJ SOLN
1.0000 mg | INTRAMUSCULAR | Status: DC | PRN
  Administered 2017-11-11 – 2017-11-12 (×6): 1 mg via INTRAVENOUS
  Filled 2017-11-11 (×6): qty 1

## 2017-11-11 MED ORDER — OXYCODONE HCL 5 MG PO TABS: 5.0000 mg | ORAL_TABLET | Freq: Once | ORAL | Status: DC

## 2017-11-11 MED ORDER — HYDROMORPHONE HCL 1 MG/ML IJ SOLN
0.5000 mg | INTRAMUSCULAR | Status: AC
  Administered 2017-11-11: 0.5 mg via INTRAVENOUS
  Filled 2017-11-11: qty 1

## 2017-11-11 MED ORDER — DEXTROSE-NACL 5-0.45 % IV SOLN
INTRAVENOUS | Status: DC
  Administered 2017-11-11: 12:00:00 via INTRAVENOUS

## 2017-11-11 MED ORDER — HYDROMORPHONE HCL 1 MG/ML IJ SOLN: 1.0000 mg | Freq: Once | INTRAMUSCULAR | Status: DC

## 2017-11-11 MED ORDER — IOPAMIDOL (ISOVUE-300) INJECTION 61%
INTRAVENOUS | Status: AC
  Administered 2017-11-11: 100 mL
  Filled 2017-11-11: qty 100

## 2017-11-11 MED ORDER — OXYCODONE HCL 5 MG PO TABS
5.0000 mg | ORAL_TABLET | ORAL | Status: DC | PRN
Start: 2017-11-11 — End: 2017-11-12
  Administered 2017-11-11 – 2017-11-12 (×2): 5 mg via ORAL
  Filled 2017-11-11 (×2): qty 1

## 2017-11-11 MED ORDER — KETOROLAC TROMETHAMINE 15 MG/ML IJ SOLN
15.0000 mg | INTRAMUSCULAR | Status: AC
  Administered 2017-11-11: 15 mg via INTRAVENOUS
  Filled 2017-11-11: qty 1

## 2017-11-11 MED ORDER — OXYCODONE HCL 5 MG PO TABS
5.0000 mg | ORAL_TABLET | Freq: Once | ORAL | Status: AC
  Administered 2017-11-11: 5 mg via ORAL
  Filled 2017-11-11: qty 1

## 2017-11-11 MED ORDER — DIPHENHYDRAMINE HCL 50 MG/ML IJ SOLN
25.0000 mg | Freq: Once | INTRAMUSCULAR | Status: AC
  Administered 2017-11-11: 25 mg via INTRAVENOUS
  Filled 2017-11-11: qty 1

## 2017-11-11 MED ORDER — HYDROMORPHONE HCL 1 MG/ML IJ SOLN
0.5000 mg | Freq: Once | INTRAMUSCULAR | Status: AC
Start: 2017-11-11 — End: 2017-11-11
  Administered 2017-11-11: 0.5 mg via INTRAVENOUS

## 2017-11-11 MED ORDER — DEXTROSE-NACL 5-0.45 % IV SOLN
INTRAVENOUS | Status: AC
  Administered 2017-11-11: 23:00:00 via INTRAVENOUS
  Administered 2017-11-12: 1000 mL via INTRAVENOUS

## 2017-11-11 MED ORDER — SODIUM CHLORIDE 0.9% FLUSH
3.0000 mL | Freq: Two times a day (BID) | INTRAVENOUS | Status: DC
  Administered 2017-11-11 – 2017-11-12 (×2): 3 mL via INTRAVENOUS

## 2017-11-11 MED ORDER — HYDROXYUREA 500 MG PO CAPS
1500.0000 mg | ORAL_CAPSULE | Freq: Every day | ORAL | Status: DC
  Administered 2017-11-12: 1500 mg via ORAL
  Filled 2017-11-11: qty 3

## 2017-11-11 MED ORDER — SENNOSIDES-DOCUSATE SODIUM 8.6-50 MG PO TABS
1.0000 | ORAL_TABLET | Freq: Two times a day (BID) | ORAL | Status: DC
  Administered 2017-11-12 – 2017-11-15 (×8): 1 via ORAL
  Filled 2017-11-11 (×10): qty 1

## 2017-11-11 MED ORDER — ONDANSETRON HCL 4 MG/2ML IJ SOLN
4.0000 mg | Freq: Four times a day (QID) | INTRAMUSCULAR | Status: DC | PRN
  Administered 2017-11-11: 4 mg via INTRAVENOUS
  Filled 2017-11-11: qty 2

## 2017-11-11 MED ORDER — ACETAMINOPHEN 500 MG PO TABS
1000.0000 mg | ORAL_TABLET | Freq: Three times a day (TID) | ORAL | Status: DC
  Administered 2017-11-11 – 2017-11-12 (×2): 1000 mg via ORAL
  Filled 2017-11-11 (×2): qty 2

## 2017-11-11 MED ORDER — HYDROMORPHONE HCL 1 MG/ML IJ SOLN
1.0000 mg | INTRAMUSCULAR | Status: AC
  Administered 2017-11-11: 1 mg via INTRAVENOUS
  Filled 2017-11-11 (×2): qty 1

## 2017-11-11 MED ORDER — HYDROMORPHONE HCL 1 MG/ML IJ SOLN
1.0000 mg | INTRAMUSCULAR | Status: AC
  Administered 2017-11-11: 1 mg via INTRAVENOUS
  Filled 2017-11-11: qty 1

## 2017-11-11 MED ORDER — HYDROMORPHONE HCL 1 MG/ML IJ SOLN: 0.5000 mg | INTRAMUSCULAR | Status: AC

## 2017-11-11 MED ORDER — ENOXAPARIN SODIUM 40 MG/0.4ML ~~LOC~~ SOLN
40.0000 mg | Freq: Every day | SUBCUTANEOUS | Status: DC
  Administered 2017-11-12 – 2017-11-15 (×4): 40 mg via SUBCUTANEOUS
  Filled 2017-11-11 (×6): qty 0.4

## 2017-11-11 MED ORDER — FOLIC ACID 1 MG PO TABS
5.0000 mg | ORAL_TABLET | Freq: Every day | ORAL | Status: DC
  Administered 2017-11-12: 5 mg via ORAL
  Filled 2017-11-11: qty 5

## 2017-11-11 MED ORDER — NALOXONE HCL 0.4 MG/ML IJ SOLN: 0.4000 mg | INTRAMUSCULAR | Status: DC | PRN

## 2017-11-11 MED ORDER — PANTOPRAZOLE SODIUM 40 MG IV SOLR
40.0000 mg | Freq: Every day | INTRAVENOUS | Status: DC
  Administered 2017-11-11 – 2017-11-13 (×3): 40 mg via INTRAVENOUS
  Filled 2017-11-11 (×3): qty 40

## 2017-11-11 MED ORDER — OXYCODONE HCL 5 MG PO TABS
15.0000 mg | ORAL_TABLET | Freq: Once | ORAL | Status: AC
  Administered 2017-11-12: 15 mg via ORAL
  Filled 2017-11-11: qty 3

## 2017-11-11 MED ORDER — GI COCKTAIL ~~LOC~~
30.0000 mL | Freq: Once | ORAL | Status: AC
  Administered 2017-11-11: 30 mL via ORAL
  Filled 2017-11-11: qty 30

## 2017-11-11 NOTE — Discharge Instructions (Signed)
Take the pain medicine as needed.  Make sure you are taking Tylenol and Motrin at home as well.  Follow-up with your hematologist tomorrow.  Return to the ED with any worsening symptoms.

## 2017-11-11 NOTE — ED Provider Notes (Signed)
Care assumed from previous provider PA [facility]. Please see their note for further details to include full history and physical. To summarize in short pt is a 31 year old male with sickle cell disease is coming in with sickle cell pain crisis pain has been uncontrolled with several doses of narcotic pain medication.  Prior provider spoke with Dr. Ashley RoyaltyMatthews who recommended additional dose of IV medication and oral oxycodone.  Reassess and determine despite after that.. Case discussed, plan agreed upon.  Patient was reassessed after repeat pain medication.  States that his pain has increased.  Discussed admission and patient was agreeable.  Call for admission and spoke with Dr. Margo AyeHall who will come to the patient in the ED.  Before patient was seen by hospitalist patient decided he wanted to leave and that he can manage his pain at home.  Patient had some low sats on room air given the pain medication.  He started to have some vomiting and states that his pain is too bad that he needs to be admitted to the hospital.  Spoke back with the hospital team who agrees to admission will see patient in the ED and place admission orders.  Patient is hemodynamically stable at this time.       Rise MuLeaphart, Jerry T, PA-C 11/11/17 1906    Pricilla LovelessGoldston, Scott, MD 11/12/17 [phone]

## 2017-11-11 NOTE — ED Triage Notes (Signed)
Pt states sickle cell pain to back chest, states he has had this sickle cell pain in the same areas before and it feels the same, since last night.

## 2017-11-11 NOTE — ED Notes (Signed)
Patient reported to this RN that provider made him aware that they wanted to admit him to the hospital, pt states he originally told provider he was okay with plan but now has decided that he does not want to stay in the hospital overnight because he has too much going on at home. Dr. Criss AlvineGoldston made aware.

## 2017-11-11 NOTE — ED Provider Notes (Signed)
[facility] EMERGENCY DEPARTMENT Provider Note   CSN: [id number] Arrival date & time: 11/11/17  1014     History   Chief Complaint Chief Complaint  Patient presents with  . Sickle Cell Pain Crisis    HPI Jerry [name] is a 31 y.o. 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  He presents the emergency department with complaint of sickle cell pain crisis.  Patient states that he has been taking his hydroxyurea normally.  Yesterday evening he developed sharp pain in his abdomen and back.  Since that time he had progressively worsening pain in his shoulders, ribs, spine and sacrum that are similar to previous episodes of sickle cell pain crisis.  He also has some pain in his chest and abdomen.  He denies shortness of breath, hemoptysis.  He denies any previous surgeries to his abdomen.  He denies any urinary symptoms, fevers, chills.  HPI  Past Medical History:  Diagnosis Date  . Sickle cell anemia [facility])     Patient Active Problem List   Diagnosis Date Noted  . Vitamin D deficiency 04/26/2017  . Problems related to release from prison 01/09/2017  . 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 SICKLE CELL ANEMIA 02/15/2007  . TOBACCO ABUSE 02/15/2007  . MARIJUANA ABUSE 02/15/2007  . GALLBLADDER DISEASE 02/07/2007    No past surgical history on file.     Home Medications    Prior to Admission medications   Medication Sig Start Date End Date Taking? Authorizing Provider  acetaminophen (TYLENOL) 500 MG tablet Take 1,000 mg by mouth every 6 (six) hours as needed for moderate pain.    [provider]  acetaminophen-codeine 120-12 MG/5ML solution Take 10 mLs every 4 (four) hours as needed by mouth for moderate pain. 10/10/17   Lawyer, Cristal Deer, PA-C  cephALEXin (KEFLEX) 500 MG capsule Take 2 capsules (1,000 mg total) 2 (two) times daily by  mouth. 10/10/17   Lawyer, Cristal Deer, PA-C  doxycycline (VIBRAMYCIN) 100 MG capsule Take 1 capsule (100 mg total) 2 (two) times daily by mouth. 10/10/17   Lawyer, Cristal Deer, PA-C  folic acid (FOLVITE) 1 MG tablet Take 1 tablet (1 mg total) by mouth daily. 04/26/17   Massie Maroon, FNP  Guaifenesin 1200 MG TB12 Take 1 tablet (1,200 mg total) 2 (two) times daily by mouth. 10/10/17   Lawyer, Cristal Deer, PA-C  hydroxyurea (HYDREA) 500 MG capsule Take 3 capsules (1,500 mg total) by mouth daily. May take with food to minimize GI side effects. 04/26/17   Massie Maroon, FNP  ibuprofen (ADVIL,MOTRIN) 600 MG tablet Take 1 tablet (600 mg total) by mouth every 8 (eight) hours as needed. 01/09/17   Quentin Angst, MD  oxyCODONE-acetaminophen (PERCOCET/ROXICET) 5-325 MG tablet Take 1 tablet by mouth every 6 (six) hours as needed for severe pain. 04/26/17   Massie Maroon, FNP  Vitamin D, Ergocalciferol, (DRISDOL) 50000 units CAPS capsule Take 1 capsule (50,000 Units total) by mouth every 7 (seven) days. 04/26/17   Massie Maroon, FNP    Family History No family history on file.  Social History Social History   Tobacco Use  . Smoking status: Never Smoker  . Smokeless tobacco: Never Used  Substance Use Topics  . Alcohol use: No  . Drug use: No     Allergies   Patient has no known allergies.   Review of Systems Review  of Systems Ten systems reviewed and are negative for acute change, except as noted in the HPI.    Physical Exam Updated Vital Signs BP (!) 113/48   Pulse 81   Temp 98.2 F (36.8 C)   Resp 18   SpO2 95%   Physical Exam  Constitutional: He appears well-developed and well-nourished. No distress.  HENT:  Head: Normocephalic and atraumatic.  Eyes: Conjunctivae and EOM are normal. Pupils are equal, round, and reactive to light. No scleral icterus.  Neck: Normal range of motion. Neck supple.  Cardiovascular: Normal rate, regular rhythm and normal heart  sounds.  Pulmonary/Chest: Effort normal and breath sounds normal. No respiratory distress.  Abdominal: Soft. There is tenderness.  TTP lower abdomen  Musculoskeletal: He exhibits no edema.  ttp along the spine   Neurological: He is alert.  Skin: Skin is warm and dry. He is not diaphoretic.  Psychiatric: His behavior is normal.  Nursing note and vitals reviewed.    ED Treatments / Results  Labs (all labs ordered are listed, but only abnormal results are displayed) Labs Reviewed  COMPREHENSIVE METABOLIC PANEL - Abnormal; Notable for the following components:      Result Value   Glucose, Bld 104 (*)    Total Bilirubin 2.4 (*)    All other components within normal limits  CBC WITH DIFFERENTIAL/PLATELET - Abnormal; Notable for the following components:   WBC 12.1 (*)    RBC 3.03 (*)    Hemoglobin 10.3 (*)    HCT 29.6 (*)    RDW 19.7 (*)    Neutro Abs 9.0 (*)    All other components within normal limits  RETICULOCYTES - Abnormal; Notable for the following components:   Retic Ct Pct 15.4 (*)    RBC. 3.03 (*)    Retic Count, Absolute 466.6 (*)    All other components within normal limits  LIPASE, BLOOD  URINALYSIS, ROUTINE W REFLEX MICROSCOPIC    EKG  EKG Interpretation  Date/Time:  Sunday November 11 2017 10:45:44 EST Ventricular Rate:  71 PR Interval:  204 QRS Duration: 90 QT Interval:  372 QTC Calculation: 404 R Axis:   95 Text Interpretation:  Normal sinus rhythm Rightward axis Borderline ECG Confirmed by Lorre NickAllen, Anthony ([id number]) on 11/11/2017 11:19:06 AM       Radiology Dg Chest 2 View  Result Date: 11/11/2017 CLINICAL DATA:  Sickle cell disease. EXAM: CHEST  2 VIEW COMPARISON:  10/10/2017 FINDINGS: The heart size is normal. There is no pleural effusion or edema. Scarring identified within the right base. Moderate to advanced skeletal changes of sickle cell disease identified. IMPRESSION: Left base scarring. Bony stigmata of sickle cell disease. Electronically  Signed   By: Signa Kellaylor  Stroud M.D.   On: 11/11/2017 11:34    Procedures Procedures (including critical care time)  Medications Ordered in ED Medications  HYDROmorphone (DILAUDID) injection 1 mg (not administered)    Or  HYDROmorphone (DILAUDID) injection 1 mg (not administered)  HYDROmorphone (DILAUDID) injection 1 mg (not administered)    Or  HYDROmorphone (DILAUDID) injection 1 mg (not administered)  HYDROmorphone (DILAUDID) injection 1 mg (not administered)    Or  HYDROmorphone (DILAUDID) injection 1 mg (not administered)  dextrose 5 %-0.45 % sodium chloride infusion ( Intravenous New Bag/Given 11/11/17 1133)  ketorolac (TORADOL) 15 MG/ML injection 15 mg (15 mg Intravenous Given 11/11/17 1132)  HYDROmorphone (DILAUDID) injection 0.5 mg (0.5 mg Intravenous Given 11/11/17 1133)    Or  HYDROmorphone (DILAUDID) injection 0.5 mg ( Subcutaneous See  Alternative 11/11/17 1133)  diphenhydrAMINE (BENADRYL) injection 25 mg (25 mg Intravenous Given 11/11/17 1132)     Initial Impression / Assessment and Plan / ED Course  I have reviewed the triage vital signs and the nursing notes.  Pertinent labs & imaging results that were available during my care of the patient were reviewed by me and considered in my medical decision making (see chart for details).     Patient with sickle cell pain crisis.  He is currently still at 8 out of 10.  I had a discussion about admission with Dr. Ashley RoyaltyMatthews who asked that since he is opiate nave we give 1/2 mg of IV Dilaudid, as well as p.o. oxycodone.  If patient's pain is improved enough he may go home with p.o. medication as he is opiate nave and does not have any medications like this at home.  I have given sign out to PA Leaphart he will reevaluate the patient.  Final Clinical Impressions(s) / ED Diagnoses   Final diagnoses:  None    ED Discharge Orders    None       Arthor CaptainHarris, Tekila Caillouet, PA-C 11/11/17 1625    Lorre NickAllen, Anthony, MD 11/13/17 [phone]

## 2017-11-11 NOTE — ED Notes (Signed)
Patient taken off of O2 per PA Leaphart's request to assess O2 sat on room air.

## 2017-11-11 NOTE — ED Notes (Signed)
Upon this RN entering room to get patient ready for discharge, pt reported that he had vomited after eating and that his pain was back to a 10/10. PA Leaphart made aware and in to speak with patient who now agreed to stay for admission.

## 2017-11-11 NOTE — ED Notes (Signed)
Patient transported to CT 

## 2017-11-11 NOTE — ED Notes (Signed)
ED Provider at bedside. 

## 2017-11-11 NOTE — H&P (Signed)
History and Physical   Jerry [name]:[id number] DOB: 06/05/1986 DOA: 11/11/2017  PCP: Quentin AngstJegede, Olugbemiga E, MD  Chief Complaint: pain  HPI:  This is a 31 year old man with sickle cell disease (12/2016  hgb electropheresis with SS quant of 79.9%) presenting with widespread pain. He reports taking only hydroxyurea and supplemental folic acid at baseline. 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 He reports pain location as diffusely across his back, chest, ribs, and shoulders. Typically he does not have any pain on a daily basis. Pain started last night he reports while undergoing playfully wrestling, described as sharp, severity 10 out of 10.  Associated symptoms include nausea. He denies any fevers, chills, cough, diarrhea, shortness of breath, dysuria. He reports never having a splenectomy. He reports excellent adherence to his hydroxyurea and folic acid.  ED Course: In emergency department his heart rate was within normal limits, systolic blood pressure 140, normal respiratory rate, maintaining O2 sats above 92% on room air. Diagnostic evaluation including CMP was remarkable for mildly elevated T bili to 2.4, leukocytosis to 12.1, hgb 10.3 increased reticulocytosis with a reticulocyte count of 15.4%, CT of the abdomen and pelvis was remarkable for mild contraction of gastric fundus with hyperkalemia, possible component of gastritis, no other focal antibody seen.  He was treated with multiple doses of IV hydromorphone with some improvement of his pain, but did not resolve. He developed nausea and vomiting and possible medicine consulted for admission for sickle cell pain crisis.  Review of Systems: A complete ROS was obtained; pertinent positives negatives are denoted in the  HPI. Otherwise, all systems are negative.   Past Medical History:  Diagnosis Date  . Sickle cell anemia (HCC)    Social History   Socioeconomic History  . Marital status: Single    Spouse name: Not on file  . Number of children: Not on file  . Years of education: Not on file  . Highest education level: Not on file  Social Needs  . Financial resource strain: Not on file  . Food insecurity - worry: Not on file  . Food insecurity - inability: Not on file  . Transportation needs - medical: Not on file  . Transportation needs - non-medical: Not on file  Occupational History  . Not on file  Tobacco Use  . Smoking status: Never Smoker  . Smokeless tobacco: Never Used  Substance and Sexual Activity  . Alcohol use: No  . Drug use: No  . Sexual activity: Not on file  Other Topics Concern  . Not on file  Social History Narrative  . Not on file   Other social details include he lives locally, lives alone, works performing manual labor, smokes tobacco as well as cannabinoid.  Family hx: Father is alive at 7754, has a history of Crohn's disease. Mother alive at the age of 151 and does not have any active medical problems.  Physical Exam: Vitals:   11/11/17 1800 11/11/17 1815 11/11/17 1830 11/11/17 1845  BP: (!) 130/95 (!) 140/92 128/85 124/82  Pulse:  92 88 98  Resp:      Temp:      SpO2:  94% 93% 94%  Weight:       General: Young black man, mild distress 2/2 to pain  ENT: Grossly normal hearing, MMM.  No scleral icterus Cardiovascular: HR within normal limits, 2/6 systolic murmur present. No LE edema.  Respiratory: CTA bilaterally. No wheezes or crackles. Normal respiratory effort. Abdomen: Soft, non-tender. Bowel sounds present.  Skin: No rash or induration seen on limited exam. Musculoskeletal: Diffusely tender to palpation across all spinous processes of back, as well as paraspinal musculature, bilateral deltoid Psychiatric: Grossly normal mood and affect. Neurologic: Moves  all extremities in coordinated fashion.  Alert and oriented, answering questions appropriately. GU: dark brown urine in urinal  I have personally reviewed the following labs, culture data, and imaging studies.  Assessment/Plan:  Acute vaso-occlusive pan crisis Sickle cell disease On presentation, generalized pain of back, shoulders, chest; precipitating factors include dehydration and manual labor at work.  Baseline hgb 9-10, retic ct ~10%.. 2018 HbS quant of 79.9%; has seen Dr. Hyman HopesJegede in the past.  No hypoxia to suggest acute chest syndrome.  No other localizing signs or symptoms.   On presentation his reticulocyte count is increased, elevated t bili consistent with exacerbation. Clinical picture consistent with vaso-occlusive pain crisis.  Plan: -Hydrate with 1 L LR then D5 1/2 NS at 125 cc x 24 hrs, continue to assess may require additional fluids -Pain control with: acetaminophen 1 g TID, hold NSAIDs in setting of possible gastritis seen on CT scan, hydromorphone 1 mg q 3 hrs for severe pain, oxycodone 5 mg q 4 hrs for moderate pain -Bowel regimen with senna-s BID scheduled -12/17 labs ordered:  reticulocyte ct, LDH, CBC, and CMP to continue to trend hemolysis markers -continue hydroxyurea at his home dose, increase folic acid to 5 mg for additional bone marrow support -ondansetron IV nausea prn  Other problems -Smoking: cannabinoid + tobacco, continue cessation efforts while inpatient -Possible gastritis:  CT scan suggestive, avoiding NSAIDs, IV PPI for now, possible transition to PO when nausea and vomiting improves  DVT prophylaxis: Subq Lovenox Code Status: desires full code upon admission Disposition Plan: anticipate transition back home living independently when improved, possibly within 1 day, but also would not be surprised if it takes 2-3 days for improvement Consults called: none Admission status:   [facility]  Laurell RoofPatrick Remmy Crass, MD [facility] [phone]  If 7PM-7AM, please contact night-coverage [email or web address] Password [email or web address]

## 2017-11-11 NOTE — ED Notes (Signed)
Pt unable to provide urine at this time. Urinal at bedside

## 2017-11-11 NOTE — ED Notes (Signed)
[facility] here for transport.  

## 2017-11-12 ENCOUNTER — Encounter (HOSPITAL_COMMUNITY): Payer: Self-pay

## 2017-11-12 DIAGNOSIS — D638 Anemia in other chronic diseases classified elsewhere: Secondary | ICD-10-CM | POA: Diagnosis present

## 2017-11-12 DIAGNOSIS — D5700 Hb-SS disease with crisis, unspecified: Secondary | ICD-10-CM | POA: Diagnosis present

## 2017-11-12 DIAGNOSIS — D72829 Elevated white blood cell count, unspecified: Secondary | ICD-10-CM

## 2017-11-12 DIAGNOSIS — R011 Cardiac murmur, unspecified: Secondary | ICD-10-CM | POA: Diagnosis not present

## 2017-11-12 DIAGNOSIS — R509 Fever, unspecified: Secondary | ICD-10-CM | POA: Diagnosis not present

## 2017-11-12 LAB — URINALYSIS, ROUTINE W REFLEX MICROSCOPIC
Bacteria, UA: NONE SEEN
Bilirubin Urine: NEGATIVE
GLUCOSE, UA: NEGATIVE mg/dL
KETONES UR: NEGATIVE mg/dL
Leukocytes, UA: NEGATIVE
NITRITE: NEGATIVE
PROTEIN: NEGATIVE mg/dL
RBC / HPF: NONE SEEN RBC/hpf (ref 0–5)
Specific Gravity, Urine: 1.012 (ref 1.005–1.030)
Squamous Epithelial / LPF: NONE SEEN
pH: 5 (ref 5.0–8.0)

## 2017-11-12 LAB — COMPREHENSIVE METABOLIC PANEL
ALBUMIN: 4.1 g/dL (ref 3.5–5.0)
ALK PHOS: 155 U/L — AB (ref 38–126)
ALT: 25 U/L (ref 17–63)
AST: 63 U/L — AB (ref 15–41)
Anion gap: 5 (ref 5–15)
BUN: 13 mg/dL (ref 6–20)
CALCIUM: 9.2 mg/dL (ref 8.9–10.3)
CO2: 28 mmol/L (ref 22–32)
CREATININE: 0.79 mg/dL (ref 0.61–1.24)
Chloride: 106 mmol/L (ref 101–111)
GFR calc non Af Amer: >60 mL/min (ref >60)
GLUCOSE: 117 mg/dL — AB (ref 65–99)
Potassium: 3.9 mmol/L (ref 3.5–5.1)
SODIUM: 139 mmol/L (ref 135–145)
Total Bilirubin: 3.6 mg/dL — ABNORMAL HIGH (ref 0.3–1.2)
Total Protein: 7.9 g/dL (ref 6.5–8.1)

## 2017-11-12 LAB — RETICULOCYTES
RBC.: 2.65 MIL/uL — ABNORMAL LOW (ref 4.22–5.81)
RETIC CT PCT: 17.5 % — AB (ref 0.4–3.1)
Retic Count, Absolute: 463.8 10*3/uL — ABNORMAL HIGH (ref 19.0–186.0)

## 2017-11-12 LAB — CBC
HCT: 26.2 % — ABNORMAL LOW (ref 39.0–52.0)
Hemoglobin: 9 g/dL — ABNORMAL LOW (ref 13.0–17.0)
MCH: 34 pg (ref 26.0–34.0)
MCHC: 34.4 g/dL (ref 30.0–36.0)
MCV: 98.9 fL (ref 78.0–100.0)
PLATELETS: 209 10*3/uL (ref 150–400)
RBC: 2.65 MIL/uL — ABNORMAL LOW (ref 4.22–5.81)
RDW: 20.1 % — AB (ref 11.5–15.5)
WBC: 16 10*3/uL — ABNORMAL HIGH (ref 4.0–10.5)

## 2017-11-12 LAB — LACTATE DEHYDROGENASE: LDH: 894 U/L — ABNORMAL HIGH (ref 98–192)

## 2017-11-12 LAB — DIFFERENTIAL
BASOS ABS: 0.3 10*3/uL — AB (ref 0.0–0.1)
BASOS PCT: 2 %
EOS PCT: 0 %
Eosinophils Absolute: 0 10*3/uL (ref 0.0–0.7)
LYMPHS PCT: 15 %
Lymphs Abs: 2.4 10*3/uL (ref 0.7–4.0)
MONO ABS: 1.8 10*3/uL — AB (ref 0.1–1.0)
Monocytes Relative: 11 %
Neutro Abs: 11.5 10*3/uL — ABNORMAL HIGH (ref 1.7–7.7)
Neutrophils Relative %: 72 %
nRBC: 5 /100 WBC — ABNORMAL HIGH

## 2017-11-12 MED ORDER — FOLIC ACID 1 MG PO TABS
1.0000 mg | ORAL_TABLET | Freq: Every day | ORAL | Status: DC
  Administered 2017-11-13 – 2017-11-15 (×3): 1 mg via ORAL
  Filled 2017-11-12 (×4): qty 1

## 2017-11-12 MED ORDER — ACETAMINOPHEN 325 MG PO TABS
650.0000 mg | ORAL_TABLET | ORAL | Status: DC | PRN
  Administered 2017-11-12: 650 mg via ORAL
  Filled 2017-11-12: qty 2

## 2017-11-12 MED ORDER — DIPHENHYDRAMINE HCL 25 MG PO CAPS: 25.0000 mg | ORAL_CAPSULE | Freq: Four times a day (QID) | ORAL | Status: DC | PRN

## 2017-11-12 MED ORDER — SODIUM CHLORIDE 0.9% FLUSH: 9.0000 mL | INTRAVENOUS | Status: DC | PRN

## 2017-11-12 MED ORDER — SODIUM CHLORIDE 0.9 % IV BOLUS (SEPSIS)
1000.0000 mL | Freq: Once | INTRAVENOUS | Status: AC
  Administered 2017-11-12: 1000 mL via INTRAVENOUS

## 2017-11-12 MED ORDER — OXYCODONE HCL 5 MG PO TABS
5.0000 mg | ORAL_TABLET | ORAL | Status: DC
  Administered 2017-11-12: 5 mg via ORAL
  Filled 2017-11-12: qty 1

## 2017-11-12 MED ORDER — NALOXONE HCL 0.4 MG/ML IJ SOLN: 0.4000 mg | INTRAMUSCULAR | Status: DC | PRN

## 2017-11-12 MED ORDER — ONDANSETRON HCL 4 MG/2ML IJ SOLN
4.0000 mg | Freq: Four times a day (QID) | INTRAMUSCULAR | Status: DC | PRN
  Administered 2017-11-12 – 2017-11-13 (×2): 4 mg via INTRAVENOUS
  Filled 2017-11-12 (×2): qty 2

## 2017-11-12 MED ORDER — KETOROLAC TROMETHAMINE 30 MG/ML IJ SOLN
30.0000 mg | Freq: Four times a day (QID) | INTRAMUSCULAR | Status: AC | PRN
  Administered 2017-11-12 (×2): 30 mg via INTRAVENOUS
  Filled 2017-11-12 (×2): qty 1

## 2017-11-12 MED ORDER — HYDROXYUREA 500 MG PO CAPS: 1500.0000 mg | ORAL_CAPSULE | ORAL | Status: DC

## 2017-11-12 MED ORDER — KETOROLAC TROMETHAMINE 30 MG/ML IJ SOLN
INTRAMUSCULAR | Status: AC
  Filled 2017-11-12: qty 1

## 2017-11-12 MED ORDER — HYDROMORPHONE 1 MG/ML IV SOLN
INTRAVENOUS | Status: DC
  Administered 2017-11-12: 2.4 mg via INTRAVENOUS
  Administered 2017-11-12: 25 mg via INTRAVENOUS
  Administered 2017-11-12: 2.7 mg via INTRAVENOUS
  Administered 2017-11-13: 3 mg via INTRAVENOUS
  Administered 2017-11-13: 3.3 mg via INTRAVENOUS
  Administered 2017-11-13: 3 mg via INTRAVENOUS
  Administered 2017-11-13: 21:00:00 via INTRAVENOUS
  Administered 2017-11-13: 3 mg via INTRAVENOUS
  Administered 2017-11-13: 1.8 mg via INTRAVENOUS
  Administered 2017-11-13: 2.1 mg via INTRAVENOUS
  Administered 2017-11-13: 1.8 mg via INTRAVENOUS
  Administered 2017-11-14: 3 mg via INTRAVENOUS
  Administered 2017-11-14: 1.5 mg via INTRAVENOUS
  Administered 2017-11-14: 2.7 mg via INTRAVENOUS
  Administered 2017-11-14: 3.9 mg via INTRAVENOUS
  Administered 2017-11-15: 2.4 mg via INTRAVENOUS
  Administered 2017-11-15: 15:00:00 via INTRAVENOUS
  Administered 2017-11-15: 2.3 mg via INTRAVENOUS
  Administered 2017-11-15: 1.5 mg via INTRAVENOUS
  Administered 2017-11-15: 2.7 mg via INTRAVENOUS
  Administered 2017-11-15: 2.1 mg via INTRAVENOUS
  Filled 2017-11-12 (×4): qty 25

## 2017-11-12 MED ORDER — HYDROXYUREA 500 MG PO CAPS
1500.0000 mg | ORAL_CAPSULE | ORAL | Status: DC
  Administered 2017-11-14 – 2017-11-16 (×2): 1500 mg via ORAL
  Filled 2017-11-12 (×3): qty 3

## 2017-11-12 MED ORDER — HYDROXYUREA 500 MG PO CAPS
2000.0000 mg | ORAL_CAPSULE | ORAL | Status: DC
  Administered 2017-11-13 – 2017-11-15 (×2): 2000 mg via ORAL
  Filled 2017-11-12 (×2): qty 4

## 2017-11-12 MED ORDER — OXYCODONE HCL 5 MG PO TABS
10.0000 mg | ORAL_TABLET | Freq: Once | ORAL | Status: AC
  Administered 2017-11-12: 10 mg via ORAL
  Filled 2017-11-12: qty 2

## 2017-11-12 MED ORDER — HYDROXYUREA 500 MG PO CAPS: 2000.0000 mg | ORAL_CAPSULE | ORAL | Status: DC

## 2017-11-12 NOTE — Progress Notes (Signed)
SICKLE CELL SERVICE PROGRESS NOTE  Jerry [name] XBM:[id number] DOB: 02/12/1986 DOA: 11/11/2017 PCP: Quentin AngstJegede, Olugbemiga E, MD  Assessment/Plan: Active Problems:   Sickle cell crisis (HCC)   Sickle cell pain crisis (HCC)  1. Hb SS with Crisis: Continue PCA at current dose. Continue Toradol and IVF.  2. Leukocytosis: No evidence of Infection related to vas-occlusive crisis.  3. Anemia of Chronic Disease: Hb at baseline. Pt has been on same dose of Hydrea for more than 10 years. Dose is currently at 18/mg/kg. Will increase to alternating dose of 1000 mg and 1500 mg every other day for a dose of 21 mg/kg.  4. Heart Murmur: Will obtain an ECHO.    Code Status: Full Code Family Communication: N/A Disposition Plan: Not yet ready for discharge  Matei Magnone A.  Pager [phone]. If 7PM-7AM, please contact night-coverage.  11/12/2017, 3:30 PM  LOS: 1 day   Interim History: This is an opiate naive patient admitted with sickle cell crisis. Pt reports pain is 8/10 and localized to back and legs. He is currently on a full dose PCA and is using it only minimally. With regard to health maintenance he is unsure if he if UTD on immunizations and has not received the influenza vaccine yet for this year.   Consultants:  None  Procedures:  None  Antibiotics:  None   Objective: Vitals:   11/12/17 0935 11/12/17 1315 11/12/17 1334 11/12/17 1349  BP: 135/89  120/83 132/74  Pulse: 87  87 87  Resp: 18 16 16 14   Temp: 97.9 F (36.6 C)     TempSrc: Oral     SpO2: 92% 92% 95% 92%  Weight:      Height:       Weight change:   Intake/Output Summary (Last 24 hours) at 11/12/2017 1530 Last data filed at 11/12/2017 0752 Gross per 24 hour  Intake 846.25 ml  Output -  Net 846.25 ml      Physical Exam General: Alert, awake, oriented x3, in mild to moderate distress.  HEENT: [place]/AT PEERL, EOMI, anicteric Neck: Trachea midline,  no masses, no thyromegal,y no JVD, no carotid  bruit OROPHARYNX:  Moist, No exudate/ erythema/lesions.  Heart: Regular rate and rhythm, II/VI systolic ejection murmur at R&L 2nd intercostal spaces. No rubs or gallops and PMI non-displaced, no heaves or thrills on palpation.  Lungs: Clear to auscultation, no wheezing or rhonchi noted. No increased vocal fremitus resonant to percussion  Abdomen: Soft, nontender, nondistended, positive bowel sounds, no masses no hepatosplenomegaly noted.  Neuro: No focal neurological deficits noted cranial nerves II through XII grossly intact.  Strength at baseline in bilateral upper and lower extremities. Musculoskeletal: No warmth swelling or erythema around joints, no spinal tenderness noted. Psychiatric: Patient alert and oriented x3, good insight and cognition, good recent to remote recall.    Data Reviewed: Basic Metabolic Panel: Recent Labs  Lab 11/11/17 1021 11/12/17 0340  NA 138 139  K 3.8 3.9  CL 106 106  CO2 23 28  GLUCOSE 104* 117*  BUN 9 13  CREATININE 0.97 0.79  CALCIUM 9.3 9.2   Liver Function Tests: Recent Labs  Lab 11/11/17 1021 11/12/17 0340  AST 38 63*  ALT 24 25  ALKPHOS 90 155*  BILITOT 2.4* 3.6*  PROT 8.1 7.9  ALBUMIN 4.5 4.1   Recent Labs  Lab 11/11/17 1200  LIPASE 23   No results for input(s): AMMONIA in the last 168 hours. CBC: Recent Labs  Lab 11/11/17 1021 11/12/17 0340  WBC 12.1* 16.0*  NEUTROABS 9.0* 11.5*  HGB 10.3* 9.0*  HCT 29.6* 26.2*  MCV 97.7 98.9  PLT 282 209   Cardiac Enzymes: No results for input(s): CKTOTAL, CKMB, CKMBINDEX, TROPONINI in the last 168 hours. BNP (last 3 results) No results for input(s): BNP in the last 8760 hours.  ProBNP (last 3 results) No results for input(s): PROBNP in the last 8760 hours.  CBG: No results for input(s): GLUCAP in the last 168 hours.  No results found for this or any previous visit (from the past 240 hour(s)).   Studies: Dg Chest 2 View  Result Date: 11/11/2017 CLINICAL DATA:  Sickle  cell disease. EXAM: CHEST  2 VIEW COMPARISON:  10/10/2017 FINDINGS: The heart size is normal. There is no pleural effusion or edema. Scarring identified within the right base. Moderate to advanced skeletal changes of sickle cell disease identified. IMPRESSION: Left base scarring. Bony stigmata of sickle cell disease. Electronically Signed   By: Signa Kellaylor  Stroud M.D.   On: 11/11/2017 11:34   Ct Abdomen Pelvis W Contrast  Result Date: 11/11/2017 CLINICAL DATA:  Back and chest pain for 2 days, history of sickle cell disease, initial encounter EXAM: CT ABDOMEN AND PELVIS WITH CONTRAST TECHNIQUE: Multidetector CT imaging of the abdomen and pelvis was performed using the standard protocol following bolus administration of intravenous contrast. CONTRAST:  100mL ISOVUE-300 IOPAMIDOL (ISOVUE-300) INJECTION 61% COMPARISON:  01/31/2007. FINDINGS: Lower chest: Lung bases are free of acute infiltrate or sizable effusion. Minimal right basilar atelectasis is noted. Hepatobiliary: No focal liver abnormality is seen. No gallstones, gallbladder wall thickening, or biliary dilatation. Pancreas: Unremarkable. No pancreatic ductal dilatation or surrounding inflammatory changes. Spleen: The spleen is small and partially calcified consistent with the given clinical history. Adrenals/Urinary Tract: Adrenal glands are unremarkable. Kidneys are normal, without renal calculi, focal lesion, or hydronephrosis. Bladder is unremarkable. Stomach/Bowel: The appendix is not well visualized. No inflammatory changes to suggest appendicitis are identified. No obstructive or inflammatory changes are seen. The gastric fundus is distended with fluid. The midportion of the stomach is somewhat contracted with mild hyperemia. The possibility of some underlying gastritis could not be totally excluded given the patient's clinical history. Vascular/Lymphatic: No significant vascular findings are present. No enlarged abdominal or pelvic lymph nodes.  Reproductive: Uterus and bilateral adnexa are unremarkable. Other: No abdominal wall hernia or abnormality. No abdominopelvic ascites. Musculoskeletal: Bony changes consistent with the known history of sickle cell disease are seen. These are relatively stable from the prior exam. IMPRESSION: Changes consistent with the given clinical history of sickle cell disease. Mild contraction of the gastric fundus with hyperemia. This could represent a mild component of gastritis. No perforation is noted. No other focal abnormality is noted. Electronically Signed   By: Alcide CleverMark  Lukens M.D.   On: 11/11/2017 13:24    Scheduled Meds: . enoxaparin (LOVENOX) injection  40 mg Subcutaneous QHS  . [START ON 11/13/2017] folic acid  1 mg Oral Daily  . HYDROmorphone   Intravenous Q4H  . [START ON 11/14/2017] hydroxyurea  2,000 mg Oral QODAY   Or  . [START ON 11/14/2017] hydroxyurea  1,500 mg Oral QODAY  . pantoprazole (PROTONIX) IV  40 mg Intravenous QHS  . senna-docusate  1 tablet Oral BID  . sodium chloride flush  3 mL Intravenous Q12H   Continuous Infusions: . dextrose 5 % and 0.45% NaCl 125 mL/hr at 11/12/17 0119    Active Problems:   Sickle cell crisis (HCC)   Sickle cell pain crisis (  HCC)     In excess of 35 minutes spent during this visit. Greater than 50% involved face to face contact with the patient for assessment, counseling and coordination of care.

## 2017-11-12 NOTE — Progress Notes (Signed)
No heating pads available right now

## 2017-11-12 NOTE — Progress Notes (Addendum)
CRITICAL VALUE ALERT  Critical Value:  Temp 103.1  Date & Time Notied:  11-12-17 1957  Provider Notified: Schorr  Orders Received/Actions taken: I L NS bolus ordered, tylenol PRN and blood cultures x2 &  Continue to monitor.

## 2017-11-13 ENCOUNTER — Other Ambulatory Visit: Payer: Self-pay

## 2017-11-13 ENCOUNTER — Encounter (HOSPITAL_COMMUNITY): Payer: Self-pay

## 2017-11-13 ENCOUNTER — Inpatient Hospital Stay (HOSPITAL_COMMUNITY): Payer: Medicaid Other

## 2017-11-13 DIAGNOSIS — R011 Cardiac murmur, unspecified: Secondary | ICD-10-CM

## 2017-11-13 DIAGNOSIS — R509 Fever, unspecified: Secondary | ICD-10-CM

## 2017-11-13 LAB — ECHOCARDIOGRAM COMPLETE
HEIGHTINCHES: 71 in
Weight: 2892.8 oz

## 2017-11-13 LAB — INFLUENZA PANEL BY PCR (TYPE A & B)
INFLAPCR: NEGATIVE
INFLBPCR: NEGATIVE

## 2017-11-13 MED ORDER — KETOROLAC TROMETHAMINE 30 MG/ML IJ SOLN
30.0000 mg | Freq: Four times a day (QID) | INTRAMUSCULAR | Status: DC
  Administered 2017-11-13 – 2017-11-16 (×12): 30 mg via INTRAVENOUS
  Filled 2017-11-13 (×11): qty 1

## 2017-11-13 MED ORDER — DEXTROSE-NACL 5-0.45 % IV SOLN
INTRAVENOUS | Status: DC
  Administered 2017-11-13 (×3): via INTRAVENOUS

## 2017-11-13 NOTE — Progress Notes (Signed)
  Echocardiogram 2D Echocardiogram has been performed.  Roosvelt MaserLane, Orissa Arreaga F 11/13/2017, 1:46 PM

## 2017-11-13 NOTE — Progress Notes (Signed)
SICKLE CELL SERVICE PROGRESS NOTE  Jerry [name] K Trimarco WUJ:[id number] DOB: 02/14/1986 DOA: 11/11/2017 PCP: Quentin AngstJegede, Olugbemiga E, MD  Assessment/Plan: Active Problems:   Sickle cell crisis (HCC)   Sickle cell pain crisis (HCC)  1. Fever: Will obtain influenza PCR.  2. Hb SS with Crisis:Customize Dilaudid PCA and Continue Toradol. Decrease IVF.  3. Leukocytosis: Pt had a fever of 103 last night. Will check influenza. Will not repeat CXR at this time as lungs sound clear.  4. Anemia of Chronic Disease: Hb at baseline. Pt has been on same dose of Hydrea for more than 10 years. Dose is currently at 18/mg/kg. Will continue alternating dose of 1000 mg and 1500 mg every other day and re-check CBC with diff and reticulocytes tomorrow.  5. Heart Murmur:  ECHO completed. Awaiting results.    Code Status: Full Code Family Communication: N/A Disposition Plan: Not yet ready for discharge  Romana Deaton A.  Pager [phone]. If 7PM-7AM, please contact night-coverage.  11/13/2017, 4:02 PM  LOS: 2 days   Interim History: This is an opiate naive patient admitted with sickle cell crisis. Pt reports pain is 8/10 in low back and 6/10 in thighs and ribs.  He has used 14.7 mg of Dilaudid on the PCA with 57/49:demands/deliveries in the last 24 hours.   Consultants:  None  Procedures:  None  Antibiotics:  None   Objective: Vitals:   11/13/17 1024 11/13/17 1200 11/13/17 1414 11/13/17 1505  BP: (!) 106/93  (!) 145/83   Pulse: (!) 107  (!) 103   Resp: 17 17 19 19   Temp: 99.7 F (37.6 C)  98.7 F (37.1 C)   TempSrc: Oral  Oral   SpO2: 97% 97% 97% 97%  Weight:      Height:       Weight change: -4.173 kg (-3.2 oz)  Intake/Output Summary (Last 24 hours) at 11/13/2017 1602 Last data filed at 11/13/2017 1500 Gross per 24 hour  Intake 5632.08 ml  Output 3905 ml  Net 1727.08 ml      Physical Exam General: Alert, awake, oriented x3, in  moderate distress.  HEENT: [place]/AT PEERL, EOMI,  anicteric  Heart: Regular rate and rhythm, II/VI systolic ejection murmur at R&L 2nd intercostal spaces-unchanged from yesterday. No rubs or gallops and PMI non-displaced, no heaves or thrills on palpation.  Lungs: Clear to auscultation, no wheezing or rhonchi noted. No increased vocal fremitus resonant to percussion  Abdomen: Soft, nontender, nondistended, positive bowel sounds, no masses no hepatosplenomegaly noted.  Neuro: No focal neurological deficits noted cranial nerves II through XII grossly intact.  Strength at baseline in bilateral upper and lower extremities. Musculoskeletal: No warmth swelling or erythema around joints, no spinal tenderness noted. Psychiatric: Patient alert and oriented x3, good insight and cognition, good recent to remote recall.    Data Reviewed: Basic Metabolic Panel: Recent Labs  Lab 11/11/17 1021 11/12/17 0340  NA 138 139  K 3.8 3.9  CL 106 106  CO2 23 28  GLUCOSE 104* 117*  BUN 9 13  CREATININE 0.97 0.79  CALCIUM 9.3 9.2   Liver Function Tests: Recent Labs  Lab 11/11/17 1021 11/12/17 0340  AST 38 63*  ALT 24 25  ALKPHOS 90 155*  BILITOT 2.4* 3.6*  PROT 8.1 7.9  ALBUMIN 4.5 4.1   Recent Labs  Lab 11/11/17 1200  LIPASE 23   No results for input(s): AMMONIA in the last 168 hours. CBC: Recent Labs  Lab 11/11/17 1021 11/12/17 0340  WBC 12.1* 16.0*  NEUTROABS 9.0* 11.5*  HGB 10.3* 9.0*  HCT 29.6* 26.2*  MCV 97.7 98.9  PLT 282 209   Cardiac Enzymes: No results for input(s): CKTOTAL, CKMB, CKMBINDEX, TROPONINI in the last 168 hours. BNP (last 3 results) No results for input(s): BNP in the last 8760 hours.  ProBNP (last 3 results) No results for input(s): PROBNP in the last 8760 hours.  CBG: No results for input(s): GLUCAP in the last 168 hours.  No results found for this or any previous visit (from the past 240 hour(s)).   Studies: Dg Chest 2 View  Result Date: 11/11/2017 CLINICAL DATA:  Sickle cell disease. EXAM:  CHEST  2 VIEW COMPARISON:  10/10/2017 FINDINGS: The heart size is normal. There is no pleural effusion or edema. Scarring identified within the right base. Moderate to advanced skeletal changes of sickle cell disease identified. IMPRESSION: Left base scarring. Bony stigmata of sickle cell disease. Electronically Signed   By: Signa Kellaylor  Stroud M.D.   On: 11/11/2017 11:34   Ct Abdomen Pelvis W Contrast  Result Date: 11/11/2017 CLINICAL DATA:  Back and chest pain for 2 days, history of sickle cell disease, initial encounter EXAM: CT ABDOMEN AND PELVIS WITH CONTRAST TECHNIQUE: Multidetector CT imaging of the abdomen and pelvis was performed using the standard protocol following bolus administration of intravenous contrast. CONTRAST:  100mL ISOVUE-300 IOPAMIDOL (ISOVUE-300) INJECTION 61% COMPARISON:  01/31/2007. FINDINGS: Lower chest: Lung bases are free of acute infiltrate or sizable effusion. Minimal right basilar atelectasis is noted. Hepatobiliary: No focal liver abnormality is seen. No gallstones, gallbladder wall thickening, or biliary dilatation. Pancreas: Unremarkable. No pancreatic ductal dilatation or surrounding inflammatory changes. Spleen: The spleen is small and partially calcified consistent with the given clinical history. Adrenals/Urinary Tract: Adrenal glands are unremarkable. Kidneys are normal, without renal calculi, focal lesion, or hydronephrosis. Bladder is unremarkable. Stomach/Bowel: The appendix is not well visualized. No inflammatory changes to suggest appendicitis are identified. No obstructive or inflammatory changes are seen. The gastric fundus is distended with fluid. The midportion of the stomach is somewhat contracted with mild hyperemia. The possibility of some underlying gastritis could not be totally excluded given the patient's clinical history. Vascular/Lymphatic: No significant vascular findings are present. No enlarged abdominal or pelvic lymph nodes. Reproductive: Uterus and  bilateral adnexa are unremarkable. Other: No abdominal wall hernia or abnormality. No abdominopelvic ascites. Musculoskeletal: Bony changes consistent with the known history of sickle cell disease are seen. These are relatively stable from the prior exam. IMPRESSION: Changes consistent with the given clinical history of sickle cell disease. Mild contraction of the gastric fundus with hyperemia. This could represent a mild component of gastritis. No perforation is noted. No other focal abnormality is noted. Electronically Signed   By: Alcide CleverMark  Lukens M.D.   On: 11/11/2017 13:24    Scheduled Meds: . enoxaparin (LOVENOX) injection  40 mg Subcutaneous QHS  . folic acid  1 mg Oral Daily  . HYDROmorphone   Intravenous Q4H  . [START ON 11/14/2017] hydroxyurea  1,500 mg Oral QODAY  . hydroxyurea  2,000 mg Oral QODAY  . ketorolac  30 mg Intravenous Q6H  . pantoprazole (PROTONIX) IV  40 mg Intravenous QHS  . senna-docusate  1 tablet Oral BID  . sodium chloride flush  3 mL Intravenous Q12H   Continuous Infusions: . dextrose 5 % and 0.45% NaCl 125 mL/hr at 11/13/17 1042    Active Problems:   Sickle cell crisis (HCC)   Sickle cell pain crisis (HCC)  In excess of 25 minutes spent during this visit. Greater than 50% involved face to face contact with the patient for assessment, counseling and coordination of care.

## 2017-11-14 DIAGNOSIS — R011 Cardiac murmur, unspecified: Secondary | ICD-10-CM

## 2017-11-14 LAB — BASIC METABOLIC PANEL
Anion gap: 5 (ref 5–15)
BUN: 16 mg/dL (ref 6–20)
CHLORIDE: 99 mmol/L — AB (ref 101–111)
CO2: 29 mmol/L (ref 22–32)
CREATININE: 0.85 mg/dL (ref 0.61–1.24)
Calcium: 8.3 mg/dL — ABNORMAL LOW (ref 8.9–10.3)
GFR calc Af Amer: >60 mL/min (ref >60)
GFR calc non Af Amer: >60 mL/min (ref >60)
GLUCOSE: 100 mg/dL — AB (ref 65–99)
POTASSIUM: 3.7 mmol/L (ref 3.5–5.1)
SODIUM: 133 mmol/L — AB (ref 135–145)

## 2017-11-14 LAB — RETICULOCYTES
RBC.: 2.42 MIL/uL — ABNORMAL LOW (ref 4.22–5.81)
RETIC COUNT ABSOLUTE: 314.6 10*3/uL — AB (ref 19.0–186.0)
RETIC CT PCT: 13 % — AB (ref 0.4–3.1)

## 2017-11-14 LAB — CBC WITH DIFFERENTIAL/PLATELET
BASOS ABS: 0 10*3/uL (ref 0.0–0.1)
Basophils Relative: 0 %
EOS ABS: 0 10*3/uL (ref 0.0–0.7)
EOS PCT: 0 %
HCT: 23.3 % — ABNORMAL LOW (ref 39.0–52.0)
Hemoglobin: 8.3 g/dL — ABNORMAL LOW (ref 13.0–17.0)
Lymphocytes Relative: 21 %
Lymphs Abs: 2.7 10*3/uL (ref 0.7–4.0)
MCH: 34.3 pg — ABNORMAL HIGH (ref 26.0–34.0)
MCHC: 35.6 g/dL (ref 30.0–36.0)
MCV: 96.3 fL (ref 78.0–100.0)
MONO ABS: 1.7 10*3/uL — AB (ref 0.1–1.0)
Monocytes Relative: 13 %
Neutro Abs: 8.7 10*3/uL — ABNORMAL HIGH (ref 1.7–7.7)
Neutrophils Relative %: 66 %
PLATELETS: 169 10*3/uL (ref 150–400)
RBC: 2.42 MIL/uL — AB (ref 4.22–5.81)
RDW: 17.7 % — AB (ref 11.5–15.5)
WBC: 13.1 10*3/uL — AB (ref 4.0–10.5)

## 2017-11-14 MED ORDER — DEXTROSE-NACL 5-0.45 % IV SOLN
INTRAVENOUS | Status: DC
  Administered 2017-11-14: 04:00:00 via INTRAVENOUS

## 2017-11-14 MED ORDER — PANTOPRAZOLE SODIUM 40 MG PO TBEC
40.0000 mg | DELAYED_RELEASE_TABLET | Freq: Every day | ORAL | Status: DC
  Administered 2017-11-14 – 2017-11-15 (×2): 40 mg via ORAL
  Filled 2017-11-14 (×2): qty 1

## 2017-11-14 MED ORDER — LIDOCAINE 5 % EX PTCH
1.0000 | MEDICATED_PATCH | CUTANEOUS | Status: DC
  Administered 2017-11-14: 1 via TRANSDERMAL
  Filled 2017-11-14 (×3): qty 1

## 2017-11-14 NOTE — Progress Notes (Signed)
PHARMACIST - PHYSICIAN COMMUNICATION  DR: Ashley RoyaltyMatthews  CONCERNING: IV to Oral Route Change Policy  RECOMMENDATION: This patient is receiving Pantoprazole by the intravenous route.  Based on criteria approved by the Pharmacy and Therapeutics Committee, the intravenous medication(s) is/are being converted to the equivalent oral dose form(s).   DESCRIPTION: These criteria include:  The patient is eating (either orally or via tube) and/or has been taking other orally administered medications for a least 24 hours  The patient has no evidence of active gastrointestinal bleeding or impaired GI absorption (gastrectomy, short bowel, patient on TNA or NPO).  If you have questions about this conversion, please contact the Pharmacy Department  []   [phone]( [phone] )  [facility] []   [phone]( [phone] )  [facility] []   [phone]( [phone] )  [facility] []   [phone]( [phone] )  [facility] [x]   [phone]( [phone] )  [facility]    Greer PickerelJigna Hosteen Kienast, PharmD, [place] Pager: [phone] 11/14/2017 9:59 AM

## 2017-11-14 NOTE — Progress Notes (Signed)
Pt took off lidocaine patch; irritation & possible reaction. Educated Pt on S/S to report: SOB, lip edema, etc. Will assess lower back & report to NS.

## 2017-11-14 NOTE — Progress Notes (Signed)
SICKLE CELL SERVICE PROGRESS NOTE  Jerry [name] K [place] ZOX:[id number] DOB: 06/03/1986 DOA: 11/11/2017 PCP: Quentin AngstJegede, Olugbemiga E, MD  Assessment/Plan: Active Problems:   Sickle cell crisis (HCC)   Sickle cell pain crisis (HCC)  1. Fever:  Influenza PCR negative and no fevers since yesterday. Fevers as part of inflammatory response in sickle cell crisis. 2. Murmur: TTE reviewed and query PFO. Consulted Cardiology for TEE.  3. Hb SS with Crisis:Continue Dilaudid PCA and Toradol. Decrease IVF. Will give trial of Lidocaine patch.  4. Leukocytosis: Improved.  5. Anemia of Chronic Disease: Hb at baseline. Will continue alternating dose of 1000 mg and 1500 mg every other day and re-check CBC with diff and reticulocytes in 48 hours.     Code Status: Full Code Family Communication: N/A Disposition Plan: Not yet ready for discharge  MATTHEWS,MICHELLE A.  Pager [phone]. If 7PM-7AM, please contact night-coverage.  11/14/2017, 12:08 PM  LOS: 3 days   Interim History:  Pt reports pain is 6/10 in low back and 6-7/10 in thighs.  He has used 14.4 mg of Dilaudid on the PCA with 53/48:demands/deliveries in the last 24 hours.   Consultants:  None  Procedures:  None  Antibiotics:  None   Objective: Vitals:   11/14/17 0200 11/14/17 0452 11/14/17 0759 11/14/17 0950  BP:  98/68  102/62  Pulse:  93  86  Resp: 14 16 13 17   Temp:  98.6 F (37 C)  98.8 F (37.1 C)  TempSrc:  Oral  Oral  SpO2: 100% 98% 99% 100%  Weight:      Height:       Weight change: 1.99 kg (4 lb 6.2 oz)  Intake/Output Summary (Last 24 hours) at 11/14/2017 1208 Last data filed at 11/14/2017 0453 Gross per 24 hour  Intake 2813.5 ml  Output 1800 ml  Net 1013.5 ml      Physical Exam General: Alert, awake, oriented x3, in  mild distress.  HEENT: [place]/AT PEERL, EOMI, anicteric  Heart: Regular rate and rhythm, II/VI systolic ejection murmur at R&L 2nd intercostal spaces-unchanged from yesterday. No rubs or  gallops and PMI non-displaced, no heaves or thrills on palpation.  Lungs: Clear to auscultation, no wheezing or rhonchi noted. No increased vocal fremitus resonant to percussion  Abdomen: Soft, nontender, nondistended, positive bowel sounds, no masses no hepatosplenomegaly noted.  Neuro: No focal neurological deficits noted cranial nerves II through XII grossly intact.  Strength at baseline in bilateral upper and lower extremities. Musculoskeletal: No warmth swelling or erythema around joints, no spinal tenderness noted. Psychiatric: Patient alert and oriented x3, good insight and cognition, good recent to remote recall.    Data Reviewed: Basic Metabolic Panel: Recent Labs  Lab 11/11/17 1021 11/12/17 0340 11/14/17 0406  NA 138 139 133*  K 3.8 3.9 3.7  CL 106 106 99*  CO2 23 28 29   GLUCOSE 104* 117* 100*  BUN 9 13 16   CREATININE 0.97 0.79 0.85  CALCIUM 9.3 9.2 8.3*   Liver Function Tests: Recent Labs  Lab 11/11/17 1021 11/12/17 0340  AST 38 63*  ALT 24 25  ALKPHOS 90 155*  BILITOT 2.4* 3.6*  PROT 8.1 7.9  ALBUMIN 4.5 4.1   Recent Labs  Lab 11/11/17 1200  LIPASE 23   No results for input(s): AMMONIA in the last 168 hours. CBC: Recent Labs  Lab 11/11/17 1021 11/12/17 0340 11/14/17 0406  WBC 12.1* 16.0* 13.1*  NEUTROABS 9.0* 11.5* 8.7*  HGB 10.3* 9.0* 8.3*  HCT 29.6* 26.2* 23.3*  MCV 97.7 98.9 96.3  PLT 282 209 169   Cardiac Enzymes: No results for input(s): CKTOTAL, CKMB, CKMBINDEX, TROPONINI in the last 168 hours. BNP (last 3 results) No results for input(s): BNP in the last 8760 hours.  ProBNP (last 3 results) No results for input(s): PROBNP in the last 8760 hours.  CBG: No results for input(s): GLUCAP in the last 168 hours.  No results found for this or any previous visit (from the past 240 hour(s)).   Studies: Dg Chest 2 View  Result Date: 11/11/2017 CLINICAL DATA:  Sickle cell disease. EXAM: CHEST  2 VIEW COMPARISON:  10/10/2017 FINDINGS: The  heart size is normal. There is no pleural effusion or edema. Scarring identified within the right base. Moderate to advanced skeletal changes of sickle cell disease identified. IMPRESSION: Left base scarring. Bony stigmata of sickle cell disease. Electronically Signed   By: Signa Kellaylor  Stroud M.D.   On: 11/11/2017 11:34   Ct Abdomen Pelvis W Contrast  Result Date: 11/11/2017 CLINICAL DATA:  Back and chest pain for 2 days, history of sickle cell disease, initial encounter EXAM: CT ABDOMEN AND PELVIS WITH CONTRAST TECHNIQUE: Multidetector CT imaging of the abdomen and pelvis was performed using the standard protocol following bolus administration of intravenous contrast. CONTRAST:  100mL ISOVUE-300 IOPAMIDOL (ISOVUE-300) INJECTION 61% COMPARISON:  01/31/2007. FINDINGS: Lower chest: Lung bases are free of acute infiltrate or sizable effusion. Minimal right basilar atelectasis is noted. Hepatobiliary: No focal liver abnormality is seen. No gallstones, gallbladder wall thickening, or biliary dilatation. Pancreas: Unremarkable. No pancreatic ductal dilatation or surrounding inflammatory changes. Spleen: The spleen is small and partially calcified consistent with the given clinical history. Adrenals/Urinary Tract: Adrenal glands are unremarkable. Kidneys are normal, without renal calculi, focal lesion, or hydronephrosis. Bladder is unremarkable. Stomach/Bowel: The appendix is not well visualized. No inflammatory changes to suggest appendicitis are identified. No obstructive or inflammatory changes are seen. The gastric fundus is distended with fluid. The midportion of the stomach is somewhat contracted with mild hyperemia. The possibility of some underlying gastritis could not be totally excluded given the patient's clinical history. Vascular/Lymphatic: No significant vascular findings are present. No enlarged abdominal or pelvic lymph nodes. Reproductive: Uterus and bilateral adnexa are unremarkable. Other: No abdominal  wall hernia or abnormality. No abdominopelvic ascites. Musculoskeletal: Bony changes consistent with the known history of sickle cell disease are seen. These are relatively stable from the prior exam. IMPRESSION: Changes consistent with the given clinical history of sickle cell disease. Mild contraction of the gastric fundus with hyperemia. This could represent a mild component of gastritis. No perforation is noted. No other focal abnormality is noted. Electronically Signed   By: Alcide CleverMark  Lukens M.D.   On: 11/11/2017 13:24    Scheduled Meds: . enoxaparin (LOVENOX) injection  40 mg Subcutaneous QHS  . folic acid  1 mg Oral Daily  . HYDROmorphone   Intravenous Q4H  . hydroxyurea  1,500 mg Oral QODAY  . hydroxyurea  2,000 mg Oral QODAY  . ketorolac  30 mg Intravenous Q6H  . lidocaine  1 patch Transdermal Q24H  . pantoprazole  40 mg Oral QHS  . senna-docusate  1 tablet Oral BID  . sodium chloride flush  3 mL Intravenous Q12H   Continuous Infusions:   Active Problems:   Sickle cell crisis (HCC)   Sickle cell pain crisis (HCC)     In excess of 25 minutes spent during this visit. Greater than 50% involved face to face contact with the patient  for assessment, counseling and coordination of care.

## 2017-11-15 LAB — BASIC METABOLIC PANEL
Anion gap: 8 (ref 5–15)
BUN: 16 mg/dL (ref 6–20)
CHLORIDE: 102 mmol/L (ref 101–111)
CO2: 27 mmol/L (ref 22–32)
CREATININE: 0.69 mg/dL (ref 0.61–1.24)
Calcium: 8.6 mg/dL — ABNORMAL LOW (ref 8.9–10.3)
GFR calc Af Amer: >60 mL/min (ref >60)
GFR calc non Af Amer: >60 mL/min (ref >60)
Glucose, Bld: 119 mg/dL — ABNORMAL HIGH (ref 65–99)
Potassium: 3.6 mmol/L (ref 3.5–5.1)
SODIUM: 137 mmol/L (ref 135–145)

## 2017-11-15 MED ORDER — DEXTROSE-NACL 5-0.45 % IV SOLN: INTRAVENOUS | Status: DC

## 2017-11-15 MED ORDER — OXYCODONE HCL 5 MG PO TABS
10.0000 mg | ORAL_TABLET | Freq: Four times a day (QID) | ORAL | Status: DC
  Administered 2017-11-15 – 2017-11-16 (×3): 10 mg via ORAL
  Filled 2017-11-15 (×3): qty 2

## 2017-11-15 MED ORDER — HYDROMORPHONE HCL 1 MG/ML IJ SOLN
1.0000 mg | INTRAMUSCULAR | Status: DC | PRN
  Administered 2017-11-16: 1 mg via INTRAVENOUS
  Filled 2017-11-15: qty 1

## 2017-11-15 MED ORDER — DEXTROSE-NACL 5-0.45 % IV SOLN
INTRAVENOUS | Status: DC
  Administered 2017-11-15: 18:00:00 via INTRAVENOUS

## 2017-11-15 NOTE — Progress Notes (Signed)
SICKLE CELL SERVICE PROGRESS NOTE  Jerry [name] ZOX:[id number] DOB: 03/18/1986 DOA: 11/11/2017 PCP: Quentin AngstJegede, Olugbemiga E, MD  Assessment/Plan: Active Problems:   Sickle cell crisis (HCC)   Sickle cell pain crisis (HCC)  1. Heart Murmur: TTE showed possible ASD. Pt for TEE tomorrow. NPO after midnight. 2. Fever:  Influenza PCR negative and no fevers in last 46 hours.Murmur: TTE reviewed and query PFO. Consulted Cardiology for TEE.  3. Hb SS with Crisis: Will discontinue PCA and schedule Oxycodone with IV Dilaudid for breakthrough. Continue Toradol.  4. Leukocytosis: Improved.  5. Anemia of Chronic Disease: Hb at baseline. Will continue alternating dose of 1000 mg and 1500 mg every other day and re-check CBC with diff and reticulocytes in 48 hours.     Code Status: Full Code Family Communication: N/A Disposition Plan: Anticipate discharge home tomorrow after TEE completed.    Agnes Brightbill A.  Pager [phone]. If 7PM-7AM, please contact night-coverage.  11/15/2017, 5:28 PM  LOS: 4 days   Interim History:  Pt reports pain is resolved in his low back and negligible in his lower extremity. He has used minuscule amounts of Dilaudid on the PCA in the last 24 hours.  Consultants:  None  Procedures:  None  Antibiotics:  None   Objective: Vitals:   11/15/17 1018 11/15/17 1240 11/15/17 1406 11/15/17 1545  BP: 112/69  123/67   Pulse: 80  92   Resp: 14 17 14 17   Temp: 98.4 F (36.9 C)  98 F (36.7 C)   TempSrc: Oral  Oral   SpO2: 100% 96% 96% 97%  Weight:      Height:       Weight change:   Intake/Output Summary (Last 24 hours) at 11/15/2017 1728 Last data filed at 11/15/2017 1018 Gross per 24 hour  Intake 480 ml  Output -  Net 480 ml      Physical Exam General: Alert, awake, oriented x 3. Well-appearing and in no apparent distress. HEENT: [place]/AT PEERL, EOMI, anicteric  Heart: Regular rate and rhythm, II/VI systolic ejection murmur at R&L 2nd  intercostal spaces-unchanged from yesterday. No rubs or gallops and PMI non-displaced, no heaves or thrills on palpation.  Lungs: Clear to auscultation, no wheezing or rhonchi noted. No increased vocal fremitus resonant to percussion  Abdomen: Soft, nontender, nondistended, positive bowel sounds, no masses no hepatosplenomegaly noted.  Neuro: No focal neurological deficits noted cranial nerves II through XII grossly intact.  Strength at baseline in bilateral upper and lower extremities. Musculoskeletal: No warmth swelling or erythema around joints, no spinal tenderness noted. Psychiatric: Patient alert and oriented x3, good insight and cognition, good recent to remote recall.    Data Reviewed: Basic Metabolic Panel: Recent Labs  Lab 11/11/17 1021 11/12/17 0340 11/14/17 0406 11/15/17 1421  NA 138 139 133* 137  K 3.8 3.9 3.7 3.6  CL 106 106 99* 102  CO2 23 28 29 27   GLUCOSE 104* 117* 100* 119*  BUN 9 13 16 16   CREATININE 0.97 0.79 0.85 0.69  CALCIUM 9.3 9.2 8.3* 8.6*   Liver Function Tests: Recent Labs  Lab 11/11/17 1021 11/12/17 0340  AST 38 63*  ALT 24 25  ALKPHOS 90 155*  BILITOT 2.4* 3.6*  PROT 8.1 7.9  ALBUMIN 4.5 4.1   Recent Labs  Lab 11/11/17 1200  LIPASE 23   No results for input(s): AMMONIA in the last 168 hours. CBC: Recent Labs  Lab 11/11/17 1021 11/12/17 0340 11/14/17 0406  WBC 12.1* 16.0* 13.1*  NEUTROABS 9.0* 11.5* 8.7*  HGB 10.3* 9.0* 8.3*  HCT 29.6* 26.2* 23.3*  MCV 97.7 98.9 96.3  PLT 282 209 169   Cardiac Enzymes: No results for input(s): CKTOTAL, CKMB, CKMBINDEX, TROPONINI in the last 168 hours. BNP (last 3 results) No results for input(s): BNP in the last 8760 hours.  ProBNP (last 3 results) No results for input(s): PROBNP in the last 8760 hours.  CBG: No results for input(s): GLUCAP in the last 168 hours.  Recent Results (from the past 240 hour(s))  Culture, blood (routine x 2)     Status: None (Preliminary result)   Collection  Time: 11/12/17 11:08 PM  Result Value Ref Range Status   Specimen Description BLOOD RIGHT HAND  Final   Special Requests   Final    BOTTLES DRAWN AEROBIC AND ANAEROBIC Blood Culture adequate volume   Culture   Final    NO GROWTH 2 DAYS Performed at [facility], [address], [place], [place] [address]    Report Status PENDING  Incomplete  Culture, blood (routine x 2)     Status: None (Preliminary result)   Collection Time: 11/12/17 11:08 PM  Result Value Ref Range Status   Specimen Description BLOOD LEFT HAND  Final   Special Requests   Final    BOTTLES DRAWN AEROBIC AND ANAEROBIC Blood Culture adequate volume   Culture   Final    NO GROWTH 2 DAYS Performed at [facility], [address], [place], [place] [address]    Report Status PENDING  Incomplete     Studies: Dg Chest 2 View  Result Date: 11/11/2017 CLINICAL DATA:  Sickle cell disease. EXAM: CHEST  2 VIEW COMPARISON:  10/10/2017 FINDINGS: The heart size is normal. There is no pleural effusion or edema. Scarring identified within the right base. Moderate to advanced skeletal changes of sickle cell disease identified. IMPRESSION: Left base scarring. Bony stigmata of sickle cell disease. Electronically Signed   By: Signa Kell M.D.   On: 11/11/2017 11:34   Ct Abdomen Pelvis W Contrast  Result Date: 11/11/2017 CLINICAL DATA:  Back and chest pain for 2 days, history of sickle cell disease, initial encounter EXAM: CT ABDOMEN AND PELVIS WITH CONTRAST TECHNIQUE: Multidetector CT imaging of the abdomen and pelvis was performed using the standard protocol following bolus administration of intravenous contrast. CONTRAST:  ISOVUE-300 IOPAMIDOL (ISOVUE-300) INJECTION 61% COMPARISON:  01/31/2007. FINDINGS: Lower chest: Lung bases are free of acute infiltrate or sizable effusion. Minimal right basilar atelectasis is noted. Hepatobiliary: No focal liver abnormality is seen. No gallstones, gallbladder wall thickening, or  biliary dilatation. Pancreas: Unremarkable. No pancreatic ductal dilatation or surrounding inflammatory changes. Spleen: The spleen is small and partially calcified consistent with the given clinical history. Adrenals/Urinary Tract: Adrenal glands are unremarkable. Kidneys are normal, without renal calculi, focal lesion, or hydronephrosis. Bladder is unremarkable. Stomach/Bowel: The appendix is not well visualized. No inflammatory changes to suggest appendicitis are identified. No obstructive or inflammatory changes are seen. The gastric fundus is distended with fluid. The midportion of the stomach is somewhat contracted with mild hyperemia. The possibility of some underlying gastritis could not be totally excluded given the patient's clinical history. Vascular/Lymphatic: No significant vascular findings are present. No enlarged abdominal or pelvic lymph nodes. Reproductive: Uterus and bilateral adnexa are unremarkable. Other: No abdominal wall hernia or abnormality. No abdominopelvic ascites. Musculoskeletal: Bony changes consistent with the known history of sickle cell disease are seen. These are relatively stable from  the prior exam. IMPRESSION: Changes consistent with the given clinical history of sickle cell disease. Mild contraction of the gastric fundus with hyperemia. This could represent a mild component of gastritis. No perforation is noted. No other focal abnormality is noted. Electronically Signed   By: Alcide CleverMark  Lukens M.D.   On: 11/11/2017 13:24    Scheduled Meds: . enoxaparin (LOVENOX) injection  40 mg Subcutaneous QHS  . folic acid  1 mg Oral Daily  . hydroxyurea  1,500 mg Oral QODAY  . hydroxyurea  2,000 mg Oral QODAY  . ketorolac  30 mg Intravenous Q6H  . lidocaine  1 patch Transdermal Q24H  . oxyCODONE  10 mg Oral Q6H  . pantoprazole  40 mg Oral QHS  . senna-docusate  1 tablet Oral BID  . sodium chloride flush  3 mL Intravenous Q12H   Continuous Infusions: . dextrose 5 % and 0.45% NaCl      Followed by  . [START ON 11/22/2017] dextrose 5 % and 0.45% NaCl      Active Problems:   Sickle cell crisis (HCC)   Sickle cell pain crisis (HCC)     In excess of 25 minutes spent during this visit. Greater than 50% involved face to face contact with the patient for assessment, counseling and coordination of care.

## 2017-11-15 NOTE — Progress Notes (Signed)
    [facility] has been requested to perform a transesophageal echocardiogram on Jerry [name] for fever, murmur and possible PFO.  After careful review of history and examination, the risks and benefits of transesophageal echocardiogram have been explained including risks of esophageal damage, perforation (1:10,000 risk), bleeding, pharyngeal hematoma as well as other potential complications associated with conscious sedation including aspiration, arrhythmia, respiratory failure and death. Alternatives to treatment were discussed, questions were answered. Patient is willing to proceed. Pt would like to go home today and return tomorrow to [facility] for [facility].  I explained that would be up to Dr. Ashley RoyaltyMatthews.    Nada BoozerLaura Genevie Elman, NP  11/15/2017 1:05 PM

## 2017-11-15 NOTE — Progress Notes (Signed)
On room air patient was 98% at rest.  With ambulation in hallway patient was from 94% to 96%. No sob noted. No issues.

## 2017-11-15 NOTE — Progress Notes (Signed)
Patient scheduled for TEE @ [facility]. Spoke with [place], RN to arrange carelink for arrival @ [facility] @ 0930 for an 1100 procedure.

## 2017-11-16 ENCOUNTER — Inpatient Hospital Stay (HOSPITAL_COMMUNITY): Payer: Medicaid Other | Admitting: Certified Registered Nurse Anesthetist

## 2017-11-16 ENCOUNTER — Inpatient Hospital Stay (HOSPITAL_COMMUNITY): Payer: Medicaid Other

## 2017-11-16 ENCOUNTER — Encounter (HOSPITAL_COMMUNITY)
Admission: EM | Disposition: A | Discharge status: Home / Self Care | Payer: Self-pay | Source: Home / Self Care | Attending: Internal Medicine

## 2017-11-16 ENCOUNTER — Encounter (HOSPITAL_COMMUNITY): Payer: Self-pay

## 2017-11-16 DIAGNOSIS — R011 Cardiac murmur, unspecified: Secondary | ICD-10-CM

## 2017-11-16 LAB — BASIC METABOLIC PANEL
Anion gap: 8 (ref 5–15)
BUN: 15 mg/dL (ref 6–20)
CALCIUM: 8.4 mg/dL — AB (ref 8.9–10.3)
CO2: 28 mmol/L (ref 22–32)
Chloride: 101 mmol/L (ref 101–111)
Creatinine, Ser: 0.82 mg/dL (ref 0.61–1.24)
GFR calc Af Amer: >60 mL/min (ref >60)
GLUCOSE: 105 mg/dL — AB (ref 65–99)
Potassium: 3.8 mmol/L (ref 3.5–5.1)
SODIUM: 137 mmol/L (ref 135–145)

## 2017-11-16 LAB — PROTIME-INR
INR: 1.06
PROTHROMBIN TIME: 13.7 s (ref 11.4–15.2)

## 2017-11-16 SURGERY — CANCELLED PROCEDURE

## 2017-11-16 MED ORDER — OXYCODONE HCL 10 MG PO TABS: 10.0000 mg | ORAL_TABLET | Freq: Four times a day (QID) | ORAL | 0 refills | Status: DC | PRN

## 2017-11-16 MED ORDER — SODIUM CHLORIDE 0.9 % IV SOLN: INTRAVENOUS | Status: DC

## 2017-11-16 NOTE — Progress Notes (Signed)
Report given to Demaris CallanderJustin, Carelink team. Informed consent obtained prior to transfer and placed in pt's chart. Pt with no complaints or concerns at time of transfer.

## 2017-11-16 NOTE — Discharge Summary (Signed)
Physician Discharge Summary  Jerry [name] [id number] DOB: 04/08/1986 DOA: 11/11/2017  PCP: Quentin AngstJegede, Aleaya Latona E, MD  Admit date: 11/11/2017  Discharge date: 11/16/2017  Discharge Diagnoses:  Active Problems:   Sickle cell crisis (HCC)   Sickle cell pain crisis (HCC)  Discharge Condition: Stable  Disposition:  Follow-up Information    Quentin AngstJegede, Davona Kinoshita E, MD Follow up.   Specialty:  Internal Medicine Contact information: [address] [place] [address] [phone]          Pt is discharged home in good condition and is to follow up with Quentin AngstJegede, Ammanda Dobbins E, MD this week to have labs evaluated. He is instructed to increase activity slowly and balance with rest for the next few days, and use prescribed medication to complete treatment of pain  Diet: Regular  Wt Readings from Last 3 Encounters:  11/16/17 82.7 kg (182 lb 5.1 oz)  04/26/17 82.1 kg (181 lb)  01/09/17 85.6 kg (188 lb 12.8 oz)   History of present illness:  This is a 31 year old man with sickle cell disease (12/2016  hgb electropheresis with SS quant of 79.9%) presenting with widespread pain. He reports taking only hydroxyurea and supplemental folic acid at baseline. 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 He reports pain location as diffusely across his back, chest, ribs, and shoulders. Typically he does not have any pain on a daily basis. Pain started last night he reports while undergoing playfully wrestling, described as sharp, severity 10 out of 10.  Associated symptoms include nausea. He denies any fevers, chills, cough, diarrhea, shortness of breath, dysuria. He reports never having a splenectomy. He reports excellent adherence to his hydroxyurea and folic acid.  ED  Course: In emergency department his heart rate was within normal limits, systolic blood pressure 140, normal respiratory rate, maintaining O2 sats above 92% on room air. Diagnostic evaluation including CMP was remarkable for mildly elevated T bili to 2.4, leukocytosis to 12.1, hgb 10.3 increased reticulocytosis with a reticulocyte count of 15.4%, CT of the abdomen and pelvis was remarkable for mild contraction of gastric fundus with hyperkalemia, possible component of gastritis, no other focal antibody seen.  He was treated with multiple doses of IV hydromorphone with some improvement of his pain, but did not resolve. He developed nausea and vomiting and possible medicine consulted for admission for sickle cell pain crisis.  Hospital Course:  Patient was admitted for Sickle Cell VOC and managed appropriately with standard sickle cell pain crisis management protocol. He was noticed to have a heart murmur, TTE was done which showed possible ASD, bubble test was negative so no need for TEE per cardiologist. Patient did well, pain returned to baseline, he was ambulating well, eating well without restriction. Patient was discharged home in a hemodynamically stable condition. He will follow up with PCP within one week of this discharge.  Discharge Exam: Vitals:   11/16/17 0618 11/16/17 1002  BP: 100/64 (!) 145/69  Pulse: 77 92  Resp: 16 20  Temp: 99.2 F (37.3 C) 99.2 F (37.3 C)  SpO2: 95% 96%   Vitals:   11/15/17 2205 11/16/17 0212 11/16/17 0618 11/16/17 1002  BP: 93/64 109/62 100/64 (!) 145/69  Pulse: 95 (!) 102 77 92  Resp: 16 16 16  20  Temp: 100.2 F (37.9 C) 98.8 F (37.1 C) 99.2 F (37.3 C) 99.2 F (37.3 C)  TempSrc: Oral Oral Oral Oral  SpO2: 96% 94% 95% 96%  Weight:   82.7 kg (182 lb 5.1 oz)   Height:        General appearance : Awake, alert, not in any distress. Speech Clear. Not toxic looking HEENT: Atraumatic and Normocephalic, pupils equally reactive to light and  accomodation Neck: Supple, no JVD. No cervical lymphadenopathy.  Chest: Good air entry bilaterally, no added sounds  CVS: S1 S2 regular, Grade 2/6 ESM, no JVD, no rub or gallops.  Abdomen: Bowel sounds present, Non tender and not distended with no gaurding, rigidity or rebound. Extremities: B/L Lower Ext shows no edema, both legs are warm to touch Neurology: Awake alert, and oriented X 3, CN II-XII intact, Non focal Skin: No Rash  Discharge Instructions  Discharge Instructions    Diet - low sodium heart healthy   Complete by:  As directed    Increase activity slowly   Complete by:  As directed      Allergies as of 11/16/2017   No Known Allergies     Medication List    TAKE these medications   acetaminophen 500 MG tablet Commonly known as:  TYLENOL Take 1,000 mg by mouth every 6 (six) hours as needed for moderate pain.   acetaminophen-codeine 120-12 MG/5ML solution Take 10 mLs every 4 (four) hours as needed by mouth for moderate pain.   cephALEXin 500 MG capsule Commonly known as:  KEFLEX Take 2 capsules (1,000 mg total) 2 (two) times daily by mouth.   folic acid 1 MG tablet Commonly known as:  FOLVITE Take 1 tablet (1 mg total) by mouth daily.   Guaifenesin 1200 MG Tb12 Take 1 tablet (1,200 mg total) 2 (two) times daily by mouth.   hydroxyurea 500 MG capsule Commonly known as:  HYDREA Take 3 capsules (1,500 mg total) by mouth daily. May take with food to minimize GI side effects.   ibuprofen 600 MG tablet Commonly known as:  ADVIL,MOTRIN Take 1 tablet (600 mg total) by mouth every 8 (eight) hours as needed.   oxyCODONE 5 MG immediate release tablet Commonly known as:  ROXICODONE Take 1 tablet (5 mg total) by mouth every 4 (four) hours as needed for severe pain.   oxyCODONE-acetaminophen 5-325 MG tablet Commonly known as:  PERCOCET/ROXICET Take 1 tablet by mouth every 6 (six) hours as needed for severe pain.   Vitamin D (Ergocalciferol) 50000 units Caps  capsule Commonly known as:  DRISDOL Take 1 capsule (50,000 Units total) by mouth every 7 (seven) days.       The results of significant diagnostics from this hospitalization (including imaging, microbiology, ancillary and laboratory) are listed below for reference.    Significant Diagnostic Studies: Dg Chest 2 View  Result Date: 11/11/2017 CLINICAL DATA:  Sickle cell disease. EXAM: CHEST  2 VIEW COMPARISON:  10/10/2017 FINDINGS: The heart size is normal. There is no pleural effusion or edema. Scarring identified within the right base. Moderate to advanced skeletal changes of sickle cell disease identified. IMPRESSION: Left base scarring. Bony stigmata of sickle cell disease. Electronically Signed   By: Signa Kell M.D.   On: 11/11/2017 11:34   Ct Abdomen Pelvis W Contrast  Result Date: 11/11/2017 CLINICAL DATA:  Back and chest pain for 2 days, history of sickle cell disease, initial encounter EXAM: CT ABDOMEN AND PELVIS WITH CONTRAST TECHNIQUE: Multidetector CT imaging of the  abdomen and pelvis was performed using the standard protocol following bolus administration of intravenous contrast. CONTRAST:  ISOVUE-300 IOPAMIDOL (ISOVUE-300) INJECTION 61% COMPARISON:  01/31/2007. FINDINGS: Lower chest: Lung bases are free of acute infiltrate or sizable effusion. Minimal right basilar atelectasis is noted. Hepatobiliary: No focal liver abnormality is seen. No gallstones, gallbladder wall thickening, or biliary dilatation. Pancreas: Unremarkable. No pancreatic ductal dilatation or surrounding inflammatory changes. Spleen: The spleen is small and partially calcified consistent with the given clinical history. Adrenals/Urinary Tract: Adrenal glands are unremarkable. Kidneys are normal, without renal calculi, focal lesion, or hydronephrosis. Bladder is unremarkable. Stomach/Bowel: The appendix is not well visualized. No inflammatory changes to suggest appendicitis are identified. No obstructive or  inflammatory changes are seen. The gastric fundus is distended with fluid. The midportion of the stomach is somewhat contracted with mild hyperemia. The possibility of some underlying gastritis could not be totally excluded given the patient's clinical history. Vascular/Lymphatic: No significant vascular findings are present. No enlarged abdominal or pelvic lymph nodes. Reproductive: Uterus and bilateral adnexa are unremarkable. Other: No abdominal wall hernia or abnormality. No abdominopelvic ascites. Musculoskeletal: Bony changes consistent with the known history of sickle cell disease are seen. These are relatively stable from the prior exam. IMPRESSION: Changes consistent with the given clinical history of sickle cell disease. Mild contraction of the gastric fundus with hyperemia. This could represent a mild component of gastritis. No perforation is noted. No other focal abnormality is noted. Electronically Signed   By: Alcide Clever M.D.   On: 11/11/2017 13:24    Microbiology: Recent Results (from the past 240 hour(s))  Culture, blood (routine x 2)     Status: None (Preliminary result)   Collection Time: 11/12/17 11:08 PM  Result Value Ref Range Status   Specimen Description BLOOD RIGHT HAND  Final   Special Requests   Final    BOTTLES DRAWN AEROBIC AND ANAEROBIC Blood Culture adequate volume   Culture   Final    NO GROWTH 2 DAYS Performed at [facility], [address] [place], [place] [address]    Report Status PENDING  Incomplete  Culture, blood (routine x 2)     Status: None (Preliminary result)   Collection Time: 11/12/17 11:08 PM  Result Value Ref Range Status   Specimen Description BLOOD LEFT HAND  Final   Special Requests   Final    BOTTLES DRAWN AEROBIC AND ANAEROBIC Blood Culture adequate volume   Culture   Final    NO GROWTH 2 DAYS Performed at [facility], [address] [place], [place] [address]    Report Status PENDING  Incomplete     Labs: Basic  Metabolic Panel: Recent Labs  Lab 11/11/17 1021 11/12/17 0340 11/14/17 0406 11/15/17 1421 11/16/17 0334  NA 138 139 133* 137 137  K 3.8 3.9 3.7 3.6 3.8  CL 106 106 99* 102 101  CO2 23 28 29 27 28   GLUCOSE 104* 117* 100* 119* 105*  BUN 9 13 16 16 15   CREATININE 0.97 0.79 0.85 0.69 0.82  CALCIUM 9.3 9.2 8.3* 8.6* 8.4*   Liver Function Tests: Recent Labs  Lab 11/11/17 1021 11/12/17 0340  AST 38 63*  ALT 24 25  ALKPHOS 90 155*  BILITOT 2.4* 3.6*  PROT 8.1 7.9  ALBUMIN 4.5 4.1   Recent Labs  Lab 11/11/17 1200  LIPASE 23   No results for input(s): AMMONIA in the last 168 hours. CBC: Recent Labs  Lab 11/11/17 1021  11/12/17 0340 11/14/17 0406  WBC 12.1* 16.0* 13.1*  NEUTROABS 9.0* 11.5* 8.7*  HGB 10.3* 9.0* 8.3*  HCT 29.6* 26.2* 23.3*  MCV 97.7 98.9 96.3  PLT 282 209 169   Cardiac Enzymes: No results for input(s): CKTOTAL, CKMB, CKMBINDEX, TROPONINI in the last 168 hours. BNP: Invalid input(s): POCBNP CBG: No results for input(s): GLUCAP in the last 168 hours.  Time coordinating discharge: 50 minutes  Signed:  Georgeana Oertel  [facility] 11/16/2017, 2:42 PM

## 2017-11-16 NOTE — Progress Notes (Signed)
  Cardiology Note  TEE cancelled  Pt had excellent transthoracic windows  LImited TTE done  With injection of agitated saline no bubbles were seen in L sided chambers    Negative Bubble study.  Full report to follow.

## 2017-11-16 NOTE — Progress Notes (Signed)
Pt discharged home with all belongings. Discharge education completed. No questions or concerns at time of discharge.  

## 2017-11-18 LAB — CULTURE, BLOOD (ROUTINE X 2)
Culture: NO GROWTH
Culture: NO GROWTH
Special Requests: ADEQUATE
Special Requests: ADEQUATE

## 2017-12-26 ENCOUNTER — Emergency Department (HOSPITAL_COMMUNITY)
Admission: EM | Admit: 2017-12-26 | Discharge: 2017-12-26 | Disposition: A | Discharge status: Home / Self Care | Payer: Self-pay | Attending: Physician Assistant | Admitting: Physician Assistant

## 2017-12-26 ENCOUNTER — Encounter (HOSPITAL_COMMUNITY): Payer: Self-pay | Admitting: Emergency Medicine

## 2017-12-26 ENCOUNTER — Emergency Department (HOSPITAL_COMMUNITY): Payer: Self-pay

## 2017-12-26 ENCOUNTER — Other Ambulatory Visit: Payer: Self-pay

## 2017-12-26 DIAGNOSIS — D5700 Hb-SS disease with crisis, unspecified: Secondary | ICD-10-CM | POA: Insufficient documentation

## 2017-12-26 LAB — CBC WITH DIFFERENTIAL/PLATELET
BASOS ABS: 0 10*3/uL (ref 0.0–0.1)
Basophils Relative: 0 %
EOS PCT: 1 %
Eosinophils Absolute: 0.1 10*3/uL (ref 0.0–0.7)
HEMATOCRIT: 24.7 % — AB (ref 39.0–52.0)
Hemoglobin: 9 g/dL — ABNORMAL LOW (ref 13.0–17.0)
Lymphocytes Relative: 19 %
Lymphs Abs: 2.3 10*3/uL (ref 0.7–4.0)
MCH: 32.6 pg (ref 26.0–34.0)
MCHC: 36.4 g/dL — ABNORMAL HIGH (ref 30.0–36.0)
MCV: 89.5 fL (ref 78.0–100.0)
MONOS PCT: 13 %
Monocytes Absolute: 1.6 10*3/uL — ABNORMAL HIGH (ref 0.1–1.0)
NEUTROS PCT: 67 %
Neutro Abs: 8.3 10*3/uL — ABNORMAL HIGH (ref 1.7–7.7)
PLATELETS: 431 10*3/uL — AB (ref 150–400)
RBC: 2.76 MIL/uL — ABNORMAL LOW (ref 4.22–5.81)
RDW: 21.2 % — ABNORMAL HIGH (ref 11.5–15.5)
WBC: 12.3 10*3/uL — AB (ref 4.0–10.5)

## 2017-12-26 LAB — RETICULOCYTES
RBC.: 2.76 MIL/uL — AB (ref 4.22–5.81)
RETIC COUNT ABSOLUTE: 124.2 10*3/uL (ref 19.0–186.0)
Retic Ct Pct: 4.5 % — ABNORMAL HIGH (ref 0.4–3.1)

## 2017-12-26 LAB — URINALYSIS, ROUTINE W REFLEX MICROSCOPIC
Bacteria, UA: NONE SEEN
Bilirubin Urine: NEGATIVE
GLUCOSE, UA: NEGATIVE mg/dL
HGB URINE DIPSTICK: NEGATIVE
KETONES UR: NEGATIVE mg/dL
Leukocytes, UA: NEGATIVE
NITRITE: NEGATIVE
PH: 8 (ref 5.0–8.0)
Protein, ur: 30 mg/dL — AB
SQUAMOUS EPITHELIAL / LPF: NONE SEEN
Specific Gravity, Urine: 1.012 (ref 1.005–1.030)

## 2017-12-26 LAB — COMPREHENSIVE METABOLIC PANEL
ALT: 20 U/L (ref 17–63)
ANION GAP: 12 (ref 5–15)
AST: 36 U/L (ref 15–41)
Albumin: 3.6 g/dL (ref 3.5–5.0)
Alkaline Phosphatase: 99 U/L (ref 38–126)
BUN: 11 mg/dL (ref 6–20)
CHLORIDE: 103 mmol/L (ref 101–111)
CO2: 23 mmol/L (ref 22–32)
Calcium: 9.1 mg/dL (ref 8.9–10.3)
Creatinine, Ser: 0.83 mg/dL (ref 0.61–1.24)
GFR calc non Af Amer: >60 mL/min (ref >60)
Glucose, Bld: 109 mg/dL — ABNORMAL HIGH (ref 65–99)
POTASSIUM: 4.1 mmol/L (ref 3.5–5.1)
Sodium: 138 mmol/L (ref 135–145)
Total Bilirubin: 1.7 mg/dL — ABNORMAL HIGH (ref 0.3–1.2)
Total Protein: 7.7 g/dL (ref 6.5–8.1)

## 2017-12-26 LAB — I-STAT CG4 LACTIC ACID, ED: LACTIC ACID, VENOUS: 1.68 mmol/L (ref 0.5–1.9)

## 2017-12-26 MED ORDER — HYDROMORPHONE HCL 1 MG/ML IJ SOLN
1.0000 mg | INTRAMUSCULAR | Status: AC
  Administered 2017-12-26: 1 mg via INTRAVENOUS
  Filled 2017-12-26: qty 1

## 2017-12-26 MED ORDER — ACETAMINOPHEN 325 MG PO TABS
650.0000 mg | ORAL_TABLET | Freq: Once | ORAL | Status: AC
  Administered 2017-12-26: 650 mg via ORAL
  Filled 2017-12-26: qty 2

## 2017-12-26 MED ORDER — HYDROMORPHONE HCL 1 MG/ML IJ SOLN: 1.0000 mg | INTRAMUSCULAR | Status: AC

## 2017-12-26 MED ORDER — ONDANSETRON HCL 4 MG/2ML IJ SOLN: 4.0000 mg | INTRAMUSCULAR | Status: DC | PRN

## 2017-12-26 MED ORDER — OXYCODONE HCL 5 MG PO TABS: 10.0000 mg | ORAL_TABLET | Freq: Four times a day (QID) | ORAL | 0 refills | Status: DC | PRN

## 2017-12-26 MED ORDER — KETOROLAC TROMETHAMINE 15 MG/ML IJ SOLN
15.0000 mg | INTRAMUSCULAR | Status: AC
  Administered 2017-12-26: 15 mg via INTRAVENOUS
  Filled 2017-12-26: qty 1

## 2017-12-26 MED ORDER — HYDROMORPHONE HCL 1 MG/ML IJ SOLN
0.5000 mg | INTRAMUSCULAR | Status: AC
  Administered 2017-12-26: 0.5 mg via INTRAVENOUS
  Filled 2017-12-26: qty 1

## 2017-12-26 MED ORDER — SODIUM CHLORIDE 0.9 % IV BOLUS (SEPSIS)
1000.0000 mL | Freq: Once | INTRAVENOUS | Status: AC
  Administered 2017-12-26: 1000 mL via INTRAVENOUS

## 2017-12-26 MED ORDER — HYDROMORPHONE HCL 1 MG/ML IJ SOLN: 0.5000 mg | INTRAMUSCULAR | Status: AC

## 2017-12-26 MED ORDER — HYDROMORPHONE HCL 1 MG/ML IJ SOLN
1.0000 mg | INTRAMUSCULAR | Status: AC
  Administered 2017-12-26 (×2): 1 mg via INTRAVENOUS
  Filled 2017-12-26 (×2): qty 1

## 2017-12-26 NOTE — Discharge Instructions (Signed)
We are sorry that you are having sickle cell pain.  Will need to follow-up with Dr. Shela CommonsJ.  We have given you some pain medicine to hopefully help you get through this period of time.  Please make sure you do not take too much, and can make you sleepy.  Do not mix with alcohol.

## 2017-12-26 NOTE — ED Notes (Signed)
Patient transported to X-ray 

## 2017-12-26 NOTE — ED Provider Notes (Signed)
[facility] Provider Note   CSN: [id number] Arrival date & time: 12/26/17  0347     History   Chief Complaint Chief Complaint  Patient presents with  . Sickle Cell Pain Crisis  . Fever    HPI Jerry [name] is a 32 y.o. male.  The history is provided by the patient.  Sickle Cell Pain Crisis  Location:  Back Severity:  Moderate Onset quality:  Gradual Similar to previous crisis episodes: yes   Timing:  Constant Progression:  Worsening Relieved by:  Nothing Worsened by:  Nothing Associated symptoms: fatigue and fever   Associated symptoms: no chest pain, no cough, no headaches, no shortness of breath, no sore throat and no vomiting   Fever   This is a new problem. The current episode started more than 2 days ago. The problem occurs daily. The problem has been gradually worsening. The maximum temperature noted was 101 to 101.9 F. Associated symptoms include muscle aches. Pertinent negatives include no chest pain, no diarrhea, no vomiting, no headaches, no sore throat and no cough.   Patient reports intermittent fevers since last Thursday, January 24. He reports body aches, back pain, pelvic pain which is all similar to prior sickle cell disease pain No active chest pain/shortness of breath/cough at this time No significant headache, no sore throat. No rashes reported [place], and was admitted for the same symptoms.  He reports he was on antibiotics but is unclear where his infection was located. He had to sign out AGAINST MEDICAL ADVICE and drive back to [place].  He is not currently on antibiotics He reports he is not on daily chronic pain medicines, he only uses Tylenol as needed at home. He reports he is in so much pain he is having difficulty walking Past Medical History:  Diagnosis Date  . Sickle cell anemia [facility](HCC)     Patient Active Problem List   Diagnosis Date Noted  . Sickle  cell crisis (HCC) 11/11/2017  . Sickle cell pain crisis (HCC) 11/11/2017  . Vitamin D deficiency 04/26/2017  . Problems related to release from prison 01/09/2017  . 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 SICKLE CELL ANEMIA 02/15/2007  . TOBACCO ABUSE 02/15/2007  . MARIJUANA ABUSE 02/15/2007  . 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 No pertinent surgical history.     Home Medications    Prior to Admission medications   Medication Sig Start Date End Date Taking? Authorizing Provider  acetaminophen (TYLENOL) 500 MG tablet Take 1,000 mg by mouth every 6 (six) hours as needed for moderate pain.   Yes [provider]  folic acid (FOLVITE) 1 MG tablet Take 1 tablet (1 mg total) by mouth daily. 04/26/17  Yes Massie MaroonHollis, Lachina M, FNP  hydroxyurea (HYDREA) 500 MG capsule Take 3 capsules (1,500 mg total) by mouth daily. May take with food to minimize GI side effects. 04/26/17  Yes Massie MaroonHollis, Lachina M, FNP  acetaminophen-codeine 120-12 MG/5ML solution Take 10 mLs every 4 (four) hours as needed by mouth for moderate pain. Patient not taking: Reported on 12/26/2017 10/10/17   Lawyer, Cristal Deerhristopher, PA-C  cephALEXin (KEFLEX) 500 MG capsule Take 2 capsules (1,000 mg total) 2 (two) times daily by mouth. Patient not taking: Reported on 11/11/2017 10/10/17   Charlestine NightLawyer, Christopher, PA-C  Guaifenesin 1200 MG TB12 Take 1 tablet (1,200 mg total) 2 (two) times daily by mouth. Patient not taking: Reported on 11/11/2017 10/10/17  Lawyer, Cristal Deer, PA-C  ibuprofen (ADVIL,MOTRIN) 600 MG tablet Take 1 tablet (600 mg total) by mouth every 8 (eight) hours as needed. Patient not taking: Reported on 12/26/2017 01/09/17   Quentin Angst, MD  oxyCODONE (ROXICODONE) 5 MG immediate release tablet Take 1 tablet (5 mg total) by mouth every 4 (four) hours as needed for severe pain. Patient not taking: Reported on 12/26/2017 11/11/17   Demetrios Loll T, PA-C  oxyCODONE 10 MG TABS Take 1 tablet  (10 mg total) by mouth every 6 (six) hours as needed for severe pain. Patient not taking: Reported on 12/26/2017 11/16/17   Quentin Angst, MD  Vitamin D, Ergocalciferol, (DRISDOL) 50000 units CAPS capsule Take 1 capsule (50,000 Units total) by mouth every 7 (seven) days. Patient not taking: Reported on 11/11/2017 04/26/17   Massie Maroon, FNP    Family History No family history on file.  Social History Social History   Tobacco Use  . Smoking status: Never Smoker  . Smokeless tobacco: Never Used  Substance Use Topics  . Alcohol use: No  . Drug use: No     Allergies   Patient has no known allergies.   Review of Systems Review of Systems  Constitutional: Positive for fatigue and fever.  HENT: Negative for sore throat.   Respiratory: Negative for cough and shortness of breath.   Cardiovascular: Negative for chest pain.  Gastrointestinal: Negative for diarrhea and vomiting.  Skin: Negative for rash.  Neurological: Negative for headaches.  All other systems reviewed and are negative.    Physical Exam Updated Vital Signs BP 132/70 (BP Location: Left Arm)   Pulse (!) 112   Temp 100 F (37.8 C) (Oral)   Resp 18   Ht 1.803 m (5\' 11" )   Wt 88.5 kg (195 lb)   SpO2 97%   BMI 27.20 kg/m   Physical Exam CONSTITUTIONAL: Well developed/well nourished HEAD: Normocephalic/atraumatic EYES: EOMI/PERRL ENMT: Mucous membranes moist uvula midline, no erythema, no exudates. NECK: supple no meningeal signs SPINE/BACK:entire spine nontender CV: S1/S2 noted, no loud/harsh murmurs LUNGS: Lungs are clear to auscultation bilaterally, no apparent distress ABDOMEN: soft, nontender, no rebound or guarding, bowel sounds noted throughout abdomen GU:no cva tenderness NEURO: Pt is awake/alert/appropriate, moves all extremitiesx4.  No facial droop.   EXTREMITIES: pulses normal/equal, full ROM, no joint effusions noted, mild tenderness to all extremities, but no erythema or  edema SKIN: warm, color normal, no rash PSYCH: no abnormalities of mood noted, alert and oriented to situation   ED Treatments / Results  Labs (all labs ordered are listed, but only abnormal results are displayed) Labs Reviewed  COMPREHENSIVE METABOLIC PANEL - Abnormal; Notable for the following components:      Result Value   Glucose, Bld 109 (*)    Total Bilirubin 1.7 (*)    All other components within normal limits  CBC WITH DIFFERENTIAL/PLATELET - Abnormal; Notable for the following components:   WBC 12.3 (*)    RBC 2.76 (*)    Hemoglobin 9.0 (*)    HCT 24.7 (*)    MCHC 36.4 (*)    RDW 21.2 (*)    Platelets 431 (*)    Neutro Abs 8.3 (*)    Monocytes Absolute 1.6 (*)    All other components within normal limits  RETICULOCYTES - Abnormal; Notable for the following components:   Retic Ct Pct 4.5 (*)    RBC. 2.76 (*)    All other components within normal limits  URINALYSIS, ROUTINE W  REFLEX MICROSCOPIC - Abnormal; Notable for the following components:   Protein, ur 30 (*)    All other components within normal limits  CULTURE, BLOOD (ROUTINE X 2)  CULTURE, BLOOD (ROUTINE X 2)  URINE CULTURE  I-STAT CG4 LACTIC ACID, ED    EKG  EKG Interpretation None       Radiology No results found.  Procedures Procedures    Medications Ordered in ED Medications  HYDROmorphone (DILAUDID) injection 1 mg (not administered)    Or  HYDROmorphone (DILAUDID) injection 1 mg (not administered)  ondansetron (ZOFRAN) injection 4 mg (not administered)  ketorolac (TORADOL) 15 MG/ML injection 15 mg (15 mg Intravenous Given 12/26/17 0554)  HYDROmorphone (DILAUDID) injection 0.5 mg (0.5 mg Intravenous Given 12/26/17 0509)    Or  HYDROmorphone (DILAUDID) injection 0.5 mg ( Subcutaneous See Alternative 12/26/17 0509)  HYDROmorphone (DILAUDID) injection 1 mg (1 mg Intravenous Given 12/26/17 0555)    Or  HYDROmorphone (DILAUDID) injection 1 mg ( Subcutaneous See Alternative 12/26/17 0555)   HYDROmorphone (DILAUDID) injection 1 mg (1 mg Intravenous Given 12/26/17 0648)    Or  HYDROmorphone (DILAUDID) injection 1 mg ( Subcutaneous See Alternative 12/26/17 0648)  acetaminophen (TYLENOL) tablet 650 mg (650 mg Oral Given 12/26/17 0554)  sodium chloride 0.9 % bolus 1,000 mL (1,000 mLs Intravenous New Bag/Given 12/26/17 0555)     Initial Impression / Assessment and Plan / ED Course  I have reviewed the triage vital signs and the nursing notes.  Pertinent labs & imaging results that were available during my care of the patient were reviewed by me and considered in my medical decision making (see chart for details).     7:30 AM Pt with h/o [place] disease presents with pain crisis for past several days He reports it is similar to prior episodes He has also had fever, but here in the ED tmax is 100 and now down to 99 He is not toxic or septic appearing Other than fever, no HA/cough/sob//vomiting/diarrhea.    He prefers to go home I advised admission, but we agree to give one more round of pain medicine to see if this improves his pain At this point, will defer antibiotics unless fever spikes  At signout to Dr Corlis Leak, reassess pain, and if not improved he will need to be admitted   Final Clinical Impressions(s) / ED Diagnoses   Final diagnoses:  None    ED Discharge Orders    None       Zadie Rhine, MD 12/26/17 [phone]

## 2017-12-26 NOTE — ED Triage Notes (Signed)
Pt reports fever on/off X6 days. Pt has hx of sickle cell and reports bilateral hip pain, lower back pain, R shoulder pain.

## 2017-12-26 NOTE — ED Provider Notes (Signed)
Patient pain improved, would like to returns home, return precautions expressed.   Nomral vitals, ambulatory at discharge.    Abelino DerrickMackuen, [place] Capers Lyn, MD 12/26/17 0900

## 2017-12-27 LAB — URINE CULTURE: CULTURE: NO GROWTH

## 2017-12-31 LAB — CULTURE, BLOOD (ROUTINE X 2)
CULTURE: NO GROWTH
CULTURE: NO GROWTH
SPECIAL REQUESTS: ADEQUATE
SPECIAL REQUESTS: ADEQUATE

## 2018-02-11 ENCOUNTER — Emergency Department (HOSPITAL_COMMUNITY): Payer: Self-pay

## 2018-02-11 ENCOUNTER — Other Ambulatory Visit: Payer: Self-pay

## 2018-02-11 ENCOUNTER — Encounter (HOSPITAL_COMMUNITY): Payer: Self-pay

## 2018-02-11 ENCOUNTER — Emergency Department (HOSPITAL_COMMUNITY)
Admission: EM | Admit: 2018-02-11 | Discharge: 2018-02-12 | Disposition: A | Discharge status: Home / Self Care | Payer: Self-pay | Attending: Emergency Medicine | Admitting: Emergency Medicine

## 2018-02-11 ENCOUNTER — Emergency Department (HOSPITAL_COMMUNITY): Admission: EM | Admit: 2018-02-11 | Discharge: 2018-02-11 | Discharge status: Against Medical Advice | Payer: Self-pay

## 2018-02-11 DIAGNOSIS — R51 Headache: Secondary | ICD-10-CM | POA: Insufficient documentation

## 2018-02-11 DIAGNOSIS — D5700 Hb-SS disease with crisis, unspecified: Secondary | ICD-10-CM | POA: Insufficient documentation

## 2018-02-11 DIAGNOSIS — Z79899 Other long term (current) drug therapy: Secondary | ICD-10-CM | POA: Insufficient documentation

## 2018-02-11 DIAGNOSIS — M791 Myalgia, unspecified site: Secondary | ICD-10-CM | POA: Insufficient documentation

## 2018-02-11 LAB — COMPREHENSIVE METABOLIC PANEL
ALT: 17 U/L (ref 17–63)
ANION GAP: 9 (ref 5–15)
AST: 32 U/L (ref 15–41)
Albumin: 5 g/dL (ref 3.5–5.0)
Alkaline Phosphatase: 107 U/L (ref 38–126)
BILIRUBIN TOTAL: 1.9 mg/dL — AB (ref 0.3–1.2)
BUN: 16 mg/dL (ref 6–20)
CO2: 25 mmol/L (ref 22–32)
Calcium: 9.8 mg/dL (ref 8.9–10.3)
Chloride: 107 mmol/L (ref 101–111)
Creatinine, Ser: 0.84 mg/dL (ref 0.61–1.24)
GFR calc Af Amer: >60 mL/min (ref >60)
Glucose, Bld: 112 mg/dL — ABNORMAL HIGH (ref 65–99)
POTASSIUM: 4.4 mmol/L (ref 3.5–5.1)
Sodium: 141 mmol/L (ref 135–145)
TOTAL PROTEIN: 9.3 g/dL — AB (ref 6.5–8.1)

## 2018-02-11 LAB — RETICULOCYTES
RBC.: 3.16 MIL/uL — ABNORMAL LOW (ref 4.22–5.81)
RETIC COUNT ABSOLUTE: 534 10*3/uL — AB (ref 19.0–186.0)
RETIC CT PCT: 16.9 % — AB (ref 0.4–3.1)

## 2018-02-11 LAB — CBC WITH DIFFERENTIAL/PLATELET
BASOS ABS: 0 10*3/uL (ref 0.0–0.1)
Basophils Relative: 0 %
EOS ABS: 0 10*3/uL (ref 0.0–0.7)
Eosinophils Relative: 0 %
HCT: 29.4 % — ABNORMAL LOW (ref 39.0–52.0)
Hemoglobin: 9.9 g/dL — ABNORMAL LOW (ref 13.0–17.0)
LYMPHS PCT: 8 %
Lymphs Abs: 1.3 10*3/uL (ref 0.7–4.0)
MCH: 31.3 pg (ref 26.0–34.0)
MCHC: 33.7 g/dL (ref 30.0–36.0)
MCV: 93 fL (ref 78.0–100.0)
MONOS PCT: 6 %
Monocytes Absolute: 1 10*3/uL (ref 0.1–1.0)
NEUTROS PCT: 86 %
Neutro Abs: 14.1 10*3/uL — ABNORMAL HIGH (ref 1.7–7.7)
PLATELETS: 606 10*3/uL — AB (ref 150–400)
RBC: 3.16 MIL/uL — AB (ref 4.22–5.81)
RDW: 24.3 % — ABNORMAL HIGH (ref 11.5–15.5)
WBC: 16.4 10*3/uL — AB (ref 4.0–10.5)
nRBC: 10 /100 WBC — ABNORMAL HIGH

## 2018-02-11 MED ORDER — HYDROMORPHONE HCL 2 MG/ML IJ SOLN: 2.0000 mg | INTRAMUSCULAR | Status: AC

## 2018-02-11 MED ORDER — HYDROMORPHONE HCL 2 MG/ML IJ SOLN
2.0000 mg | INTRAMUSCULAR | Status: AC
  Administered 2018-02-11: 2 mg via INTRAVENOUS
  Filled 2018-02-11: qty 1

## 2018-02-11 MED ORDER — DEXTROSE-NACL 5-0.45 % IV SOLN
INTRAVENOUS | Status: DC
  Administered 2018-02-11: 21:00:00 via INTRAVENOUS

## 2018-02-11 MED ORDER — HYDROMORPHONE HCL 1 MG/ML IJ SOLN
0.5000 mg | Freq: Once | INTRAMUSCULAR | Status: AC
  Administered 2018-02-11: 0.5 mg via SUBCUTANEOUS
  Filled 2018-02-11: qty 1

## 2018-02-11 MED ORDER — HYDROMORPHONE HCL 2 MG/ML IJ SOLN
2.0000 mg | INTRAMUSCULAR | Status: AC
  Administered 2018-02-11 – 2018-02-12 (×2): 2 mg via INTRAVENOUS
  Filled 2018-02-11 (×2): qty 1

## 2018-02-11 MED ORDER — KETOROLAC TROMETHAMINE 30 MG/ML IJ SOLN
30.0000 mg | INTRAMUSCULAR | Status: AC
  Administered 2018-02-11: 30 mg via INTRAVENOUS
  Filled 2018-02-11: qty 1

## 2018-02-11 NOTE — ED Triage Notes (Signed)
Pt arrives c/o pain from both knees all the way up to his head starting this am. Pt reports taking ibuprofen with no relief. Pt reports pain is similar to pain crises before, but pain has never been this severe before. Pt c/o chest pain and shortness of breath at this time. Speaking in full sentences and ambulatory to triage.

## 2018-02-11 NOTE — ED Provider Notes (Signed)
[facility]-EMERGENCY DEPT Provider Note   CSN: [id number] Arrival date & time: 02/11/18  1733     History   Chief Complaint Chief Complaint  Patient presents with  . Sickle Cell Pain Crisis    HPI Jerry [name] is a 32 y.o. male presenting for evaluation of sickle cell pain.  Patient states that his pain began acutely today, beginning at bilateral knees and continuing through his head.  He has worsened pain occipitally.  He denies fall, trauma, or injury.  He states this is a normal course of his pain during his sickle cell pain crisis, although it is more severe today than normal.  He did not have any of his at home oxycodone to take for pain, tried ibuprofen without improvement of symptoms.  He reports intermittent shortness of breath, worse with exertion.  No increased chest pain relative to elsewhere.  He denies fevers, chills, sore throat, cough, nausea, vomiting, urinary symptoms, abnormal bowel movements.  No history of DVT, he is not on blood thinners.  He denies vision changes, slurred speech, numbness, weakness.  HPI  Past Medical History:  Diagnosis Date  . Sickle cell anemia [facility])     Patient Active Problem List   Diagnosis Date Noted  . Sickle cell crisis (HCC) 11/11/2017  . Sickle cell pain crisis (HCC) 11/11/2017  . Vitamin D deficiency 04/26/2017  . Problems related to release from prison 01/09/2017  . 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 SICKLE CELL ANEMIA 02/15/2007  . TOBACCO ABUSE 02/15/2007  . MARIJUANA ABUSE 02/15/2007  . 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 No pertinent surgical history.     Home Medications    Prior to Admission medications   Medication Sig Start Date End Date Taking? Authorizing Provider  hydroxyurea (HYDREA) 500 MG capsule Take 3 capsules (1,500 mg total) by mouth daily. May take with food to minimize GI side effects. 04/26/17  Yes Massie Maroon, FNP  acetaminophen  (TYLENOL) 500 MG tablet Take 1,000 mg by mouth every 6 (six) hours as needed for moderate pain.    [provider]  acetaminophen-codeine 120-12 MG/5ML solution Take 10 mLs every 4 (four) hours as needed by mouth for moderate pain. Patient not taking: Reported on 12/26/2017 10/10/17   Lawyer, Cristal Deer, PA-C  cephALEXin (KEFLEX) 500 MG capsule Take 2 capsules (1,000 mg total) 2 (two) times daily by mouth. Patient not taking: Reported on 11/11/2017 10/10/17   Charlestine Night, PA-C  folic acid (FOLVITE) 1 MG tablet Take 1 tablet (1 mg total) by mouth daily. Patient not taking: Reported on 02/11/2018 04/26/17   Massie Maroon, FNP  Guaifenesin 1200 MG TB12 Take 1 tablet (1,200 mg total) 2 (two) times daily by mouth. Patient not taking: Reported on 11/11/2017 10/10/17   Charlestine Night, PA-C  ibuprofen (ADVIL,MOTRIN) 600 MG tablet Take 1 tablet (600 mg total) by mouth every 8 (eight) hours as needed. Patient not taking: Reported on 12/26/2017 01/09/17   Quentin Angst, MD  oxyCODONE (ROXICODONE) 5 MG immediate release tablet Take 1 tablet (5 mg total) by mouth every 4 (four) hours as needed for severe pain. Patient not taking: Reported on 12/26/2017 11/11/17   Demetrios Loll T, PA-C  oxyCODONE (ROXICODONE) 5 MG immediate release tablet Take 2 tablets (10 mg total) by mouth every 6 (six) hours as needed for severe pain. Patient not taking: Reported on 02/11/2018 12/26/17   Mackuen, Cindee Salt, MD  oxyCODONE 10 MG TABS Take  1 tablet (10 mg total) by mouth every 6 (six) hours as needed for severe pain. Patient not taking: Reported on 12/26/2017 11/16/17   Quentin Angst, MD  Vitamin D, Ergocalciferol, (DRISDOL) 50000 units CAPS capsule Take 1 capsule (50,000 Units total) by mouth every 7 (seven) days. Patient not taking: Reported on 11/11/2017 04/26/17   Massie Maroon, FNP    Family History History reviewed. No pertinent family history.  Social History Social  History   Tobacco Use  . Smoking status: Never Smoker  . Smokeless tobacco: Never Used  Substance Use Topics  . Alcohol use: No  . Drug use: Yes    Types: Marijuana    Comment: occasionally     Allergies   Patient has no known allergies.   Review of Systems Review of Systems  Musculoskeletal: Positive for myalgias.  Neurological: Positive for headaches.  All other systems reviewed and are negative.    Physical Exam Updated Vital Signs BP 124/71 (BP Location: Left Arm)   Pulse 60   Temp 98.5 F (36.9 C) (Oral)   Resp 10   Ht 5\' 11"  (1.803 m)   Wt 80.3 kg (177 lb)   SpO2 93%   BMI 24.69 kg/m   Physical Exam  Constitutional: He is oriented to person, place, and time. He appears well-developed and well-nourished.  Patient appears uncomfortable due to pain.  In no acute distress.  HENT:  Head: Normocephalic and atraumatic.  Mouth/Throat: Uvula is midline, oropharynx is clear and moist and mucous membranes are normal.  Eyes: Conjunctivae and EOM are normal. Pupils are equal, round, and reactive to light.  Neck: Normal range of motion. Neck supple.  No neck pain or signs of meningismus.  Cardiovascular: Normal rate, regular rhythm and intact distal pulses.  Pulmonary/Chest: Effort normal and breath sounds normal. No respiratory distress. He has no wheezes.  Abdominal: Soft. He exhibits no distension and no mass. There is no guarding.  Musculoskeletal: Normal range of motion.  Neurological: He is alert and oriented to person, place, and time. He has normal strength. No cranial nerve deficit or sensory deficit. Coordination and gait normal. GCS eye subscore is 4. GCS verbal subscore is 5. GCS motor subscore is 6.  Skin: Skin is warm. No rash noted.  Psychiatric: He has a normal mood and affect.  Nursing note and vitals reviewed.    ED Treatments / Results  Labs (all labs ordered are listed, but only abnormal results are displayed) Labs Reviewed  COMPREHENSIVE  METABOLIC PANEL - Abnormal; Notable for the following components:      Result Value   Glucose, Bld 112 (*)    Total Protein 9.3 (*)    Total Bilirubin 1.9 (*)    All other components within normal limits  CBC WITH DIFFERENTIAL/PLATELET - Abnormal; Notable for the following components:   WBC 16.4 (*)    RBC 3.16 (*)    Hemoglobin 9.9 (*)    HCT 29.4 (*)    RDW 24.3 (*)    Platelets 606 (*)    nRBC 10 (*)    Neutro Abs 14.1 (*)    All other components within normal limits  RETICULOCYTES - Abnormal; Notable for the following components:   Retic Ct Pct 16.9 (*)    RBC. 3.16 (*)    Retic Count, Absolute 534.0 (*)    All other components within normal limits    EKG  EKG Interpretation  Date/Time:  Monday February 11 2018 18:38:33 EDT Ventricular Rate:  86 PR Interval:    QRS Duration: 98 QT Interval:  357 QTC Calculation: 427 R Axis:   63 Text Interpretation:  Sinus rhythm Consider left atrial enlargement ST elev, probable normal early repol pattern Confirmed by Kristine RoyalMessick, Peter [phone]([id number]) on 02/11/2018 8:40:29 PM       Radiology Dg Chest 2 View  Result Date: 02/11/2018 CLINICAL DATA:  Dyspnea and pain EXAM: CHEST - 2 VIEW COMPARISON:  12/26/2017, CT chest 10/10/2017 FINDINGS: Mild right infrahilar scarring. No acute opacity or pleural effusion. Stable cardiomediastinal silhouette. No pneumothorax. Bony changes consistent with history of sickle cell. IMPRESSION: Mild right infrahilar scarring.  No acute pulmonary opacity is seen. Electronically Signed   By: Jasmine PangKim  Fujinaga M.D.   On: 02/11/2018 19:45    Procedures Procedures (including critical care time)  Medications Ordered in ED Medications  dextrose 5 %-0.45 % sodium chloride infusion ( Intravenous New Bag/Given 02/11/18 2112)  HYDROmorphone (DILAUDID) injection 2 mg (not administered)    Or  HYDROmorphone (DILAUDID) injection 2 mg (not administered)  HYDROmorphone (DILAUDID) injection 0.5 mg (0.5 mg Subcutaneous Given 02/11/18  1906)  ketorolac (TORADOL) 30 MG/ML injection 30 mg (30 mg Intravenous Given 02/11/18 2112)  HYDROmorphone (DILAUDID) injection 2 mg (2 mg Intravenous Given 02/11/18 2109)    Or  HYDROmorphone (DILAUDID) injection 2 mg ( Subcutaneous See Alternative 02/11/18 2109)  HYDROmorphone (DILAUDID) injection 2 mg (2 mg Intravenous Given 02/11/18 2158)    Or  HYDROmorphone (DILAUDID) injection 2 mg ( Subcutaneous See Alternative 02/11/18 2158)  HYDROmorphone (DILAUDID) injection 2 mg (2 mg Intravenous Given 02/11/18 2231)    Or  HYDROmorphone (DILAUDID) injection 2 mg ( Subcutaneous See Alternative 02/11/18 2231)     Initial Impression / Assessment and Plan / ED Course  I have reviewed the triage vital signs and the nursing notes.  Pertinent labs & imaging results that were available during my care of the patient were reviewed by me and considered in my medical decision making (see chart for details).     Presenting with severe sickle cell pain crisis.  Pain is worse in the occipital head.  No neurologic deficits.  Patient also reports shortness of breath, EKG and chest x-ray reassuring.  He is afebrile.  Doubt acute chest, PE, or AMI.  Likely sickle crisis.  Labs show patient is in crisis without need for transfusion, hemoglobin stable.  Will start sickle cell order set with IV Dilaudid, ketorolac, and half-normal saline. Case discussed with attending, Dr. Rodena MedinMessick agrees to plan.   Patient reports pain has improved mildly from 10 to 8 with 3 doses of morphine.  SOB has resolved. He is unsure that he will be able to go home and not have to return due to pain.  Will reassess in 30-45 minutes.  Patient signed out to Carmie KannerK Humes, PA-C for final dispo.   Final Clinical Impressions(s) / ED Diagnoses   Final diagnoses:  Sickle cell pain crisis [facility](HCC)    ED Discharge Orders    None       Alveria ApleyCaccavale, Carleen Rhue, PA-C 02/11/18 2300    Wynetta FinesMessick, Peter C, MD 02/11/18 [phone]

## 2018-02-12 MED ORDER — OXYCODONE-ACETAMINOPHEN 5-325 MG PO TABS
2.0000 | ORAL_TABLET | Freq: Once | ORAL | Status: AC
  Administered 2018-02-12: 2 via ORAL
  Filled 2018-02-12: qty 2

## 2018-02-12 NOTE — ED Provider Notes (Signed)
12:10 AM Patient reassessed.  He states the pain is better controlled.  He expresses comfort with further outpatient management.  2 tablets of Percocet given prior to discharge.  He has been instructed to follow-up with the sickle cell clinic regarding his visit today.  Vitals:   02/11/18 1803 02/11/18 2200 02/11/18 2242 02/11/18 2330  BP: 118/68 136/72 124/71 135/77  Pulse: 89 84 60 91  Resp: 18 17 10 20   Temp: 98.5 F (36.9 C)     TempSrc: Oral     SpO2: 99% 95% 93% 99%  Weight: 80.3 kg (177 lb)     Height: 5\' 11"  (1.803 m)         Antony MaduraHumes, Genny Caulder, PA-C 02/12/18 0011    Wynetta FinesMessick, Peter C, MD 02/12/18 [phone]

## 2018-02-12 NOTE — ED Notes (Signed)
Pt didn't want to be admitted, wanted to address rest of his pain at home

## 2018-02-15 ENCOUNTER — Emergency Department (HOSPITAL_COMMUNITY): Payer: Self-pay

## 2018-02-15 ENCOUNTER — Encounter (HOSPITAL_COMMUNITY): Payer: Self-pay | Admitting: Emergency Medicine

## 2018-02-15 ENCOUNTER — Emergency Department (HOSPITAL_COMMUNITY)
Admission: EM | Admit: 2018-02-15 | Discharge: 2018-02-15 | Disposition: A | Discharge status: Home / Self Care | Payer: Self-pay | Attending: Emergency Medicine | Admitting: Emergency Medicine

## 2018-02-15 DIAGNOSIS — Z79899 Other long term (current) drug therapy: Secondary | ICD-10-CM | POA: Insufficient documentation

## 2018-02-15 DIAGNOSIS — D5700 Hb-SS disease with crisis, unspecified: Secondary | ICD-10-CM | POA: Insufficient documentation

## 2018-02-15 LAB — CBC WITH DIFFERENTIAL/PLATELET
BASOS ABS: 0 10*3/uL (ref 0.0–0.1)
Band Neutrophils: 2 %
Basophils Relative: 0 %
Blasts: 0 %
Eosinophils Absolute: 0 10*3/uL (ref 0.0–0.7)
Eosinophils Relative: 0 %
HCT: 23.8 % — ABNORMAL LOW (ref 39.0–52.0)
HEMOGLOBIN: 8.1 g/dL — AB (ref 13.0–17.0)
Lymphocytes Relative: 18 %
Lymphs Abs: 2.4 10*3/uL (ref 0.7–4.0)
MCH: 29.9 pg (ref 26.0–34.0)
MCHC: 34 g/dL (ref 30.0–36.0)
MCV: 87.8 fL (ref 78.0–100.0)
METAMYELOCYTES PCT: 0 %
MONO ABS: 1.6 10*3/uL — AB (ref 0.1–1.0)
MYELOCYTES: 0 %
Monocytes Relative: 12 %
NEUTROS PCT: 68 %
Neutro Abs: 9.4 10*3/uL — ABNORMAL HIGH (ref 1.7–7.7)
Other: 0 %
PLATELETS: 491 10*3/uL — AB (ref 150–400)
PROMYELOCYTES ABS: 0 %
RBC: 2.71 MIL/uL — AB (ref 4.22–5.81)
RDW: 21.6 % — ABNORMAL HIGH (ref 11.5–15.5)
WBC: 13.4 10*3/uL — AB (ref 4.0–10.5)
nRBC: 8 /100 WBC — ABNORMAL HIGH

## 2018-02-15 LAB — COMPREHENSIVE METABOLIC PANEL
ALK PHOS: 163 U/L — AB (ref 38–126)
ALT: 23 U/L (ref 17–63)
AST: 26 U/L (ref 15–41)
Albumin: 4.1 g/dL (ref 3.5–5.0)
Anion gap: 11 (ref 5–15)
BILIRUBIN TOTAL: 2.2 mg/dL — AB (ref 0.3–1.2)
BUN: 10 mg/dL (ref 6–20)
CALCIUM: 9.1 mg/dL (ref 8.9–10.3)
CHLORIDE: 99 mmol/L — AB (ref 101–111)
CO2: 24 mmol/L (ref 22–32)
CREATININE: 0.91 mg/dL (ref 0.61–1.24)
Glucose, Bld: 139 mg/dL — ABNORMAL HIGH (ref 65–99)
Potassium: 3.3 mmol/L — ABNORMAL LOW (ref 3.5–5.1)
Sodium: 134 mmol/L — ABNORMAL LOW (ref 135–145)
Total Protein: 8.5 g/dL — ABNORMAL HIGH (ref 6.5–8.1)

## 2018-02-15 LAB — RETICULOCYTES
RBC.: 2.71 MIL/uL — ABNORMAL LOW (ref 4.22–5.81)
RETIC COUNT ABSOLUTE: 333.3 10*3/uL — AB (ref 19.0–186.0)
RETIC CT PCT: 12.3 % — AB (ref 0.4–3.1)

## 2018-02-15 LAB — D-DIMER, QUANTITATIVE: D-Dimer, Quant: 12.47 ug/mL-FEU — ABNORMAL HIGH (ref 0.00–0.50)

## 2018-02-15 LAB — URINALYSIS, ROUTINE W REFLEX MICROSCOPIC
BACTERIA UA: NONE SEEN
Bilirubin Urine: NEGATIVE
Glucose, UA: NEGATIVE mg/dL
KETONES UR: NEGATIVE mg/dL
Leukocytes, UA: NEGATIVE
Nitrite: NEGATIVE
PROTEIN: 30 mg/dL — AB
RBC / HPF: NONE SEEN RBC/hpf (ref 0–5)
SQUAMOUS EPITHELIAL / LPF: NONE SEEN
Specific Gravity, Urine: 1.01 (ref 1.005–1.030)
pH: 5 (ref 5.0–8.0)

## 2018-02-15 LAB — I-STAT TROPONIN, ED
TROPONIN I, POC: 0 ng/mL (ref 0.00–0.08)
Troponin i, poc: 0.02 ng/mL (ref 0.00–0.08)

## 2018-02-15 LAB — I-STAT CG4 LACTIC ACID, ED
Lactic Acid, Venous: 0.91 mmol/L (ref 0.5–1.9)
Lactic Acid, Venous: 1.14 mmol/L (ref 0.5–1.9)

## 2018-02-15 MED ORDER — HYDROMORPHONE HCL 1 MG/ML IJ SOLN: 1.0000 mg | INTRAMUSCULAR | Status: DC

## 2018-02-15 MED ORDER — POTASSIUM CHLORIDE CRYS ER 20 MEQ PO TBCR
40.0000 meq | EXTENDED_RELEASE_TABLET | Freq: Once | ORAL | Status: AC
  Administered 2018-02-15: 40 meq via ORAL
  Filled 2018-02-15: qty 2

## 2018-02-15 MED ORDER — HYDROMORPHONE HCL 1 MG/ML IJ SOLN: 0.5000 mg | INTRAMUSCULAR | Status: AC

## 2018-02-15 MED ORDER — HYDROMORPHONE HCL 1 MG/ML IJ SOLN
1.0000 mg | Freq: Once | INTRAMUSCULAR | Status: AC
  Administered 2018-02-15: 1 mg via INTRAVENOUS
  Filled 2018-02-15: qty 1

## 2018-02-15 MED ORDER — SODIUM CHLORIDE 0.45 % IV SOLN
INTRAVENOUS | Status: DC
  Administered 2018-02-15: 07:00:00 via INTRAVENOUS

## 2018-02-15 MED ORDER — IOPAMIDOL (ISOVUE-370) INJECTION 76%
INTRAVENOUS | Status: AC
  Administered 2018-02-15: 100 mL
  Filled 2018-02-15: qty 100

## 2018-02-15 MED ORDER — HYDROMORPHONE HCL 1 MG/ML IJ SOLN: 1.0000 mg | INTRAMUSCULAR | Status: AC

## 2018-02-15 MED ORDER — HYDROMORPHONE HCL 1 MG/ML IJ SOLN
2.0000 mg | INTRAMUSCULAR | Status: AC
  Administered 2018-02-15: 2 mg via INTRAVENOUS
  Filled 2018-02-15: qty 2

## 2018-02-15 MED ORDER — HYDROMORPHONE HCL 1 MG/ML IJ SOLN: 2.0000 mg | Freq: Once | INTRAMUSCULAR | Status: DC

## 2018-02-15 MED ORDER — KETOROLAC TROMETHAMINE 30 MG/ML IJ SOLN
30.0000 mg | INTRAMUSCULAR | Status: AC
  Administered 2018-02-15: 30 mg via INTRAVENOUS
  Filled 2018-02-15: qty 1

## 2018-02-15 MED ORDER — OXYCODONE-ACETAMINOPHEN 5-325 MG PO TABS
1.0000 | ORAL_TABLET | Freq: Once | ORAL | Status: AC
  Administered 2018-02-15: 1 via ORAL
  Filled 2018-02-15: qty 1

## 2018-02-15 MED ORDER — HYDROMORPHONE HCL 1 MG/ML IJ SOLN
1.0000 mg | INTRAMUSCULAR | Status: AC
  Administered 2018-02-15: 1 mg via INTRAVENOUS
  Filled 2018-02-15: qty 1

## 2018-02-15 MED ORDER — ONDANSETRON HCL 4 MG/2ML IJ SOLN
4.0000 mg | INTRAMUSCULAR | Status: DC | PRN
  Administered 2018-02-15 (×2): 4 mg via INTRAVENOUS
  Filled 2018-02-15 (×2): qty 2

## 2018-02-15 MED ORDER — DIPHENHYDRAMINE HCL 50 MG/ML IJ SOLN
25.0000 mg | Freq: Once | INTRAMUSCULAR | Status: AC
  Administered 2018-02-15: 25 mg via INTRAVENOUS
  Filled 2018-02-15: qty 1

## 2018-02-15 MED ORDER — HYDROMORPHONE HCL 1 MG/ML IJ SOLN
0.5000 mg | INTRAMUSCULAR | Status: AC
  Administered 2018-02-15: 0.5 mg via INTRAVENOUS
  Filled 2018-02-15: qty 1

## 2018-02-15 MED ORDER — HYDROCODONE-ACETAMINOPHEN 5-325 MG PO TABS: 1.0000 | ORAL_TABLET | ORAL | 0 refills | Status: DC | PRN

## 2018-02-15 NOTE — ED Provider Notes (Signed)
[facility] EMERGENCY DEPARTMENT Provider Note   CSN: [id number] Arrival date & time: 02/15/18  0459     History   Chief Complaint Chief Complaint  Patient presents with  . Sickle Cell Pain Crisis    HPI Jerry [name] is a 32 y.o. male.  HPI 32 year old African-American male past medical history significant for sickle cell anemia presents to the emergency department today for ongoing sickle cell pain crisis.  Patient states that his pain started on 3/18.  Patient was seen in the ED at that time was given several rounds of pain medication with improvement in his pain.  Patient states that since going home he has not been able to manage with ibuprofen.  Patient states that the pain is in the left side of his body.  States the pain starts in his left thigh and radiates up to his left neck and gives him a headache.  States that this is normal areas of pain for his sickle cell pain crisis although it is more severe today than normal.  Patient has been taking ibuprofen at home without any relief.  He also endorses chest pain and intermittent shortness of breath.  The shortness of breath is worse with exertion.  No increased chest pain and relations to other pain.  Patient reports subjective fevers with his temperature being 99.8 at home.  Patient denies any chills, sore throat, cough, nausea, vomiting, abdominal pain, urinary symptoms, change in bowel habits.  Denies any history of DVT/PE, prolonged immobilization, recent hospitalization/surgeries, unilateral leg swelling or calf tenderness patient is not on blood thinners.  Denies any vision changes, lightheadedness, dizziness, paresthesias, weakness, slurred speech.  Denies any recent injuries or falls.  States that the headache is not acute onset or maximal in onset.  States is a pounding pressure-like sensation.  She states he is followed by a sickle cell clinic.  Pt denies any chill, ha, vision changes, lightheadedness,  dizziness, congestion, neck pain, cough, abd pain, n/v/d, urinary symptoms, change in bowel habits, melena, hematochezia, lower extremity paresthesias.  Past Medical History:  Diagnosis Date  . Sickle cell anemia [facility])     Patient Active Problem List   Diagnosis Date Noted  . Sickle cell crisis (HCC) 11/11/2017  . Sickle cell pain crisis (HCC) 11/11/2017  . Vitamin D deficiency 04/26/2017  . Problems related to release from prison 01/09/2017  . 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 SICKLE CELL ANEMIA 02/15/2007  . TOBACCO ABUSE 02/15/2007  . MARIJUANA ABUSE 02/15/2007  . 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 No pertinent surgical history.     Home Medications    Prior to Admission medications   Medication Sig Start Date End Date Taking? Authorizing Provider  folic acid (FOLVITE) 1 MG tablet Take 1 tablet (1 mg total) by mouth daily. 04/26/17  Yes Massie Maroon, FNP  hydroxyurea (HYDREA) 500 MG capsule Take 3 capsules (1,500 mg total) by mouth daily. May take with food to minimize GI side effects. 04/26/17  Yes Massie Maroon, FNP  ibuprofen (ADVIL,MOTRIN) 600 MG tablet Take 1 tablet (600 mg total) by mouth every 8 (eight) hours as needed. 01/09/17  Yes Quentin Angst, MD  acetaminophen-codeine 120-12 MG/5ML solution Take 10 mLs every 4 (four) hours as needed by mouth for moderate pain. Patient not taking: Reported on 12/26/2017 10/10/17   Charlestine Night, PA-C  cephALEXin (KEFLEX) 500 MG capsule Take 2 capsules (1,000 mg total) 2 (two) times daily by  mouth. Patient not taking: Reported on 11/11/2017 10/10/17   Charlestine Night, PA-C  Guaifenesin 1200 MG TB12 Take 1 tablet (1,200 mg total) 2 (two) times daily by mouth. Patient not taking: Reported on 11/11/2017 10/10/17   Charlestine Night, PA-C  oxyCODONE (ROXICODONE) 5 MG immediate release tablet Take 1 tablet (5 mg total) by mouth every 4 (four) hours as needed for severe  pain. Patient not taking: Reported on 12/26/2017 11/11/17   Demetrios Loll T, PA-C  oxyCODONE (ROXICODONE) 5 MG immediate release tablet Take 2 tablets (10 mg total) by mouth every 6 (six) hours as needed for severe pain. Patient not taking: Reported on 02/11/2018 12/26/17   Mackuen, Courteney Lyn, MD  oxyCODONE 10 MG TABS Take 1 tablet (10 mg total) by mouth every 6 (six) hours as needed for severe pain. Patient not taking: Reported on 12/26/2017 11/16/17   Quentin Angst, MD  Vitamin D, Ergocalciferol, (DRISDOL) 50000 units CAPS capsule Take 1 capsule (50,000 Units total) by mouth every 7 (seven) days. Patient not taking: Reported on 11/11/2017 04/26/17   Massie Maroon, FNP    Family History No family history on file.  Social History Social History   Tobacco Use  . Smoking status: Never Smoker  . Smokeless tobacco: Never Used  Substance Use Topics  . Alcohol use: No  . Drug use: Yes    Types: Marijuana    Comment: occasionally     Allergies   Patient has no known allergies.   Review of Systems Review of Systems  All other systems reviewed and are negative.    Physical Exam Updated Vital Signs BP (!) 143/79   Pulse (!) 119   Temp 99.7 F (37.6 C) (Oral)   Resp 14   Ht 5\' 11"  (1.803 m)   Wt 80.3 kg (177 lb)   SpO2 100%   BMI 24.69 kg/m   Physical Exam  Constitutional: He is oriented to person, place, and time. He appears well-developed and well-nourished.  Non-toxic appearance. No distress.  HENT:  Head: Normocephalic and atraumatic.  Nose: Nose normal.  Mouth/Throat: Oropharynx is clear and moist.  Eyes: Pupils are equal, round, and reactive to light. Conjunctivae and EOM are normal. Right eye exhibits no discharge. Left eye exhibits no discharge.  Neck: Normal range of motion. Neck supple. No JVD present. No tracheal deviation present.  No nuchal rigidity.  Cardiovascular: Regular rhythm, normal heart sounds and intact distal pulses. Exam reveals  no gallop and no friction rub.  No murmur heard. Tachycardia noted  Pulmonary/Chest: Effort normal and breath sounds normal. No stridor. No respiratory distress. He has no wheezes. He has no rales. He exhibits no tenderness.  No hypoxia or tachypnea.  Abdominal: Soft. Bowel sounds are normal. He exhibits no distension. There is no tenderness. There is no rigidity, no rebound, no guarding, no CVA tenderness, no tenderness at McBurney's point and negative Murphy's sign.  Musculoskeletal: Normal range of motion.  No lower extremity edema or calf tenderness.  Full range of motion of all joints without any erythema or warmth of the joints.  Skin compartments are soft.  Patient with diffuse midline tenderness of the cervical, lumbar and thoracic spine.  No step-offs or deformities noted.  There is to palpation over the left thigh.  No edema, ecchymosis noted.  DP pulses are 2+ bilaterally.  Sensation intact.  Brisk cap refill.  Lymphadenopathy:    He has no cervical adenopathy.  Neurological: He is alert and oriented to person, place,  and time.  Skin: Skin is warm and dry. Capillary refill takes less than 2 seconds. He is not diaphoretic.  Psychiatric: His behavior is normal. Judgment and thought content normal.  Nursing note and vitals reviewed.    ED Treatments / Results  Labs (all labs ordered are listed, but only abnormal results are displayed) Labs Reviewed  COMPREHENSIVE METABOLIC PANEL - Abnormal; Notable for the following components:      Result Value   Sodium 134 (*)    Potassium 3.3 (*)    Chloride 99 (*)    Glucose, Bld 139 (*)    Total Protein 8.5 (*)    Alkaline Phosphatase 163 (*)    Total Bilirubin 2.2 (*)    All other components within normal limits  CBC WITH DIFFERENTIAL/PLATELET - Abnormal; Notable for the following components:   WBC 13.4 (*)    RBC 2.71 (*)    Hemoglobin 8.1 (*)    HCT 23.8 (*)    RDW 21.6 (*)    Platelets 491 (*)    nRBC 8 (*)    Neutro Abs  9.4 (*)    Monocytes Absolute 1.6 (*)    All other components within normal limits  RETICULOCYTES - Abnormal; Notable for the following components:   Retic Ct Pct 12.3 (*)    RBC. 2.71 (*)    Retic Count, Absolute 333.3 (*)    All other components within normal limits  URINALYSIS, ROUTINE W REFLEX MICROSCOPIC - Abnormal; Notable for the following components:   Hgb urine dipstick SMALL (*)    Protein, ur 30 (*)    All other components within normal limits  D-DIMER, QUANTITATIVE (NOT AT [facility])  I-STAT CG4 LACTIC ACID, ED  I-STAT TROPONIN, ED    EKG  EKG Interpretation  Date/Time:  Friday February 15 2018 06:48:28 EDT Ventricular Rate:  108 PR Interval:    QRS Duration: 91 QT Interval:  321 QTC Calculation: 431 R Axis:   28 Text Interpretation:  Sinus tachycardia Biatrial enlargement RSR' in V1 or V2, probably normal variant ST elev, probable normal early repol pattern No significant change since last tracing other than rate is faster Confirmed by Rochele Raring [phone]) on 02/15/2018 6:56:51 AM       Radiology No results found.  Procedures Procedures (including critical care time)  Medications Ordered in ED Medications  0.45 % sodium chloride infusion ( Intravenous New Bag/Given 02/15/18 0646)  HYDROmorphone (DILAUDID) injection 1 mg (has no administration in time range)    Or  HYDROmorphone (DILAUDID) injection 1 mg (has no administration in time range)  ondansetron (ZOFRAN) injection 4 mg (4 mg Intravenous Given 02/15/18 0637)  HYDROmorphone (DILAUDID) injection 2 mg (has no administration in time range)    Or  HYDROmorphone (DILAUDID) injection 1 mg (has no administration in time range)  ketorolac (TORADOL) 30 MG/ML injection 30 mg (30 mg Intravenous Given 02/15/18 6045)  HYDROmorphone (DILAUDID) injection 0.5 mg (0.5 mg Intravenous Given 02/15/18 4098)    Or  HYDROmorphone (DILAUDID) injection 0.5 mg ( Subcutaneous See Alternative 02/15/18 1191)  diphenhydrAMINE (BENADRYL)  injection 25 mg (25 mg Intravenous Given 02/15/18 4782)     Initial Impression / Assessment and Plan / ED Course  I have reviewed the triage vital signs and the nursing notes.  Pertinent labs & imaging results that were available during my care of the patient were reviewed by me and considered in my medical decision making (see chart for details).     She presents to  the ED with ongoing sickle cell pain crisis.  Seen in the ED on 3/18 for same symptoms and states that he was not able to control his pain at home.  Patient reports entire left-sided pain with associated chest pain or shortness of breath.  Patient is with a low-grade fever in the ED.  Tachycardia noted.  No hypotension or hypoxia noted.  Patient is overall well-appearing and nontoxic.  Skin compartments are soft.  Neurovascular intact in all extremities.  No erythema or warmth of the joints to be concerning for septic arthritis.  Lungs clear to auscultation bilaterally.  Tachycardia noted with regular rhythm and no rubs murmurs or gallops.  No focal abdominal tenderness to palpation.  Patient is lab work seems consistent with sickle cell pain crisis.  A leukocytosis noted with elevated reticulocyte count.  Patient's hemoglobin is 8.1 baseline appears to be 9.  Do not feel the patient needs transfusion at this time.  Kidney function is normal.  Normal liver enzymes.  UA shows no signs of infection.  Negative delta troponin.  Lactic acid normal.  EKG with tachycardia but no signs of acute ischemia noted.  Chest x-ray did not show any signs of focal infiltrate.  Doubt acute chest.  Given patient's ongoing symptoms of shortness of breath and chest pain d-dimer was ordered that this was significantly elevated however secondary to sickle cell.  Will obtain CTA of chest to rule out PE.  CTA of chest showed no signs of acute PE.  Does note trace right effusion.  Doubt causing patient's symptoms.  His pain significantly improved in the  ED.  I did discussed admission versus consult pain clinic versus home.  Patient states that he feels like he can go home and manage his pain with small course of pain medication until he can follow-up with the sickle cell clinic.  The patient states he will call his sickle cell pain doctor.  Vital signs improved while in the ED.  Pt is hemodynamically stable, in NAD, & able to ambulate in the ED. Evaluation does not show pathology that would require ongoing emergent intervention or inpatient treatment. I explained the diagnosis to the patient. Pain has been managed & has no complaints prior to dc. Pt is comfortable with above plan and is stable for discharge at this time. All questions were answered prior to disposition. Strict return precautions for f/u to the ED were discussed. Encouraged follow up with PCP.     Final Clinical Impressions(s) / ED Diagnoses   Final diagnoses:  Sickle cell pain crisis [facility](HCC)    ED Discharge Orders        Ordered    HYDROcodone-acetaminophen (NORCO/VICODIN) 5-325 MG tablet  Every 4 hours PRN     02/15/18 1142       Rise MuLeaphart, Shi Grose T, PA-C 02/15/18 1654    Ward, Layla MawKristen N, DO 02/19/18 2355

## 2018-02-15 NOTE — ED Notes (Signed)
Pt transport to Xray

## 2018-02-15 NOTE — ED Notes (Signed)
Pt endorsees 10/10 left arm pain and back pain

## 2018-02-15 NOTE — ED Triage Notes (Signed)
Pt reports he is experiencing sickle cell crisis, pain being on the left side of his body.

## 2018-02-15 NOTE — Discharge Instructions (Addendum)
Your workup has been reassuring.  No signs of blood clot in your lungs.  Your potassium was slightly low please make sure you increase her potassium intake with spinach, banana and other containing potassium foods.  Your hemoglobin is slightly lower than your baseline will need be rechecked with your primary care or your sickle cell doctor next week at your scheduled appointment.  Use the pain medication as needed along with over-the-counter ibuprofen.  Make sure you call your sickle cell doctor today to schedule appointment if needed and return to ED with any worsening symptoms.

## 2018-02-15 NOTE — ED Notes (Signed)
ED Provider at bedside. 

## 2018-02-15 NOTE — ED Notes (Signed)
Patient verbalizes understanding of discharge instructions. Opportunity for questioning and answers were provided. Armband removed by staff, pt discharged from ED ambulatory.   

## 2018-02-15 NOTE — ED Notes (Signed)
Patient transported to CT 

## 2018-02-19 ENCOUNTER — Ambulatory Visit (INDEPENDENT_AMBULATORY_CARE_PROVIDER_SITE_OTHER): Payer: Self-pay | Admitting: Internal Medicine

## 2018-02-19 VITALS — BP 110/55 | HR 92 | Temp 98.4°F | Resp 14 | Ht 71.0 in | Wt 174.0 lb

## 2018-02-19 DIAGNOSIS — D571 Sickle-cell disease without crisis: Secondary | ICD-10-CM

## 2018-02-19 MED ORDER — IBUPROFEN 600 MG PO TABS: 600.0000 mg | ORAL_TABLET | Freq: Three times a day (TID) | ORAL | 3 refills | Status: DC | PRN

## 2018-02-19 MED ORDER — OXYCODONE HCL 10 MG PO TABS: 10.0000 mg | ORAL_TABLET | Freq: Four times a day (QID) | ORAL | 0 refills | Status: DC | PRN

## 2018-02-19 NOTE — Patient Instructions (Signed)
Sickle Cell Anemia, Adult °Sickle cell anemia is a condition in which red blood cells have an abnormal “sickle” shape. This abnormal shape shortens the cells’ life span, which results in a lower than normal concentration of red blood cells in the blood. The sickle shape also causes the cells to clump together and block free blood flow through the blood vessels. As a result, the tissues and organs of the body do not receive enough oxygen. Sickle cell anemia causes organ damage and pain and increases the risk of infection. °What are the causes? °Sickle cell anemia is a genetic disorder. Those who receive two copies of the gene have the condition, and those who receive one copy have the trait. °What increases the risk? °The sickle cell gene is most common in people whose families originated in [place]. Other areas of the globe where sickle cell trait occurs include the Mediterranean, [place] and [place], the [place], and the [place]. °What are the signs or symptoms? °· Pain, especially in the extremities, back, chest, or abdomen (common). The pain may start suddenly or may develop following an illness, especially if there is dehydration. Pain can also occur due to overexertion or exposure to extreme temperature changes. °· Frequent severe bacterial infections, especially certain types of pneumonia and meningitis. °· Pain and swelling in the hands and feet. °· Decreased activity. °· Loss of appetite. °· Change in behavior. °· Headaches. °· Seizures. °· Shortness of breath or difficulty breathing. °· Vision changes. °· Skin ulcers. °Those with the trait may not have symptoms or they may have mild symptoms. °How is this diagnosed? °Sickle cell anemia is diagnosed with blood tests that demonstrate the genetic trait. It is often diagnosed during the newborn period, due to mandatory testing nationwide. A variety of blood tests, X-rays, CT scans, MRI scans, ultrasounds, and lung function tests may also be done to  monitor the condition. °How is this treated? °Sickle cell anemia may be treated with: °· Medicines. You may be given pain medicines, antibiotic medicines (to treat and prevent infections) or medicines to increase the production of certain types of hemoglobin. °· Fluids. °· Oxygen. °· Blood transfusions. ° °Follow these instructions at home: °· Drink enough fluid to keep your urine clear or pale yellow. Increase your fluid intake in hot weather and during exercise. °· Do not smoke. Smoking lowers oxygen levels in the blood. °· Only take over-the-counter or prescription medicines for pain, fever, or discomfort as directed by your health care provider. °· Take antibiotics as directed by your health care provider. Make sure you finish them it even if you start to feel better. °· Take supplements as directed by your health care provider. °· Consider wearing a medical alert bracelet. This tells anyone caring for you in an emergency of your condition. °· When traveling, keep your medical information, health care provider's names, and the medicines you take with you at all times. °· If you develop a fever, do not take medicines to reduce the fever right away. This could cover up a problem that is developing. Notify your health care provider. °· Keep all follow-up appointments with your health care provider. Sickle cell anemia requires regular medical care. °Contact a health care provider if: °You have a fever. °Get help right away if: °· You feel dizzy or faint. °· You have new abdominal pain, especially on the left side near the stomach area. °· You develop a persistent, often uncomfortable and painful penile erection (priapism). If this is not   treated immediately it will lead to impotence. °· You have numbness your arms or legs or you have a hard time moving them. °· You have a hard time with speech. °· You have a fever or persistent symptoms for more than 2-3 days. °· You have a fever and your symptoms suddenly get  worse. °· You have signs or symptoms of infection. These include: °? Chills. °? Abnormal tiredness (lethargy). °? Irritability. °? Poor eating. °? Vomiting. °· You develop pain that is not helped with medicine. °· You develop shortness of breath. °· You have pain in your chest. °· You are coughing up pus-like or bloody sputum. °· You develop a stiff neck. °· Your feet or hands swell or have pain. °· Your abdomen appears bloated. °· You develop joint pain. °This information is not intended to replace advice given to you by your health care provider. Make sure you discuss any questions you have with your health care provider. °Document Released: 02/21/2006 Document Revised: 06/02/2016 Document Reviewed: 06/25/2013 °Elsevier Interactive Patient Education © 2017 [employer] Inc. ° °

## 2018-02-19 NOTE — Progress Notes (Signed)
Jerry [name], is a 32 y.o. male  QIO:[id number]:[id number]  LKG:[id number]  DOB - 08/22/1986  Chief Complaint  Patient presents with  . Sickle Cell Anemia  . Hospitalization Follow-up  . Medication Refill    hydrocodone.        Subjective:   Jerry Sinkarrance Vandenbosch is a 32 y.o. male with history of sickle cell disease who presents here today for a follow up visit and medication refill. Pain is typically controlled with Ibuprofen and Acetaminophen, rarely has crisis. He has no new complaint today, no pain, needs refill of ibuprofen. No fever, no chest pain. Patient has No headache, No abdominal pain - No Nausea, No new weakness tingling or numbness, No Cough - SOB. He denies any depression, no suicidal ideation or thought.   ALLERGIES: No Known Allergies  PAST MEDICAL HISTORY: Past Medical History:  Diagnosis Date  . Sickle cell anemia (HCC)    MEDICATIONS AT HOME: Prior to Admission medications   Medication Sig Start Date End Date Taking? Authorizing Provider  folic acid (FOLVITE) 1 MG tablet Take 1 tablet (1 mg total) by mouth daily. 04/26/17  Yes Massie MaroonHollis, Lachina M, FNP  hydroxyurea (HYDREA) 500 MG capsule Take 3 capsules (1,500 mg total) by mouth daily. May take with food to minimize GI side effects. 04/26/17  Yes Massie MaroonHollis, Lachina M, FNP  ibuprofen (ADVIL,MOTRIN) 600 MG tablet Take 1 tablet (600 mg total) by mouth every 8 (eight) hours as needed. 02/19/18  Yes Quentin AngstJegede, Micheal Murad E, MD  Oxycodone HCl 10 MG TABS Take 1 tablet (10 mg total) by mouth every 6 (six) hours as needed for up to 15 days. 02/19/18 03/06/18  Quentin AngstJegede, Quatavious Rossa E, MD   Objective:   Vitals:   02/19/18 1011  BP: (!) 110/55  Pulse: 92  Resp: 14  Temp: 98.4 F (36.9 C)  TempSrc: Oral  SpO2: 100%  Weight: 174 lb (78.9 kg)  Height: 5\' 11"  (1.803 m)   Exam General appearance : Awake, alert, not in any distress. Speech Clear. Not toxic looking HEENT: Atraumatic and Normocephalic, pupils equally reactive to light and  accomodation Neck: Supple, no JVD. No cervical lymphadenopathy.  Chest: Good air entry bilaterally, no added sounds  CVS: S1 S2 regular, no murmurs.  Abdomen: Bowel sounds present, Non tender and not distended with no gaurding, rigidity or rebound. 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  Dispense: 60 tablet; Refill: 3  Patient have been counseled extensively about nutrition and exercise. Other issues discussed during this visit include: low cholesterol diet, weight control and daily exercise, annual eye examinations at Ophthalmology  Return in about 2 months (around 04/21/2018) for Sickle Cell Disease/Pain.  The patient was given clear instructions to go to ER or return to medical center if symptoms don't improve, worsen or new problems develop. The patient verbalized understanding. The patient was told to call to get lab results if they haven't heard anything in the next week.   This note has been created with Education officer, environmentalDragon speech recognition software and smart phrase technology. Any transcriptional errors are unintentional.    Jeanann Lewandowskylugbemiga Carrianne Hyun, MD, MHA, FACP, FAAP, CPE [facility] [place], [place] [phone]   02/19/2018, 11:06 AM

## 2018-02-26 ENCOUNTER — Encounter (HOSPITAL_COMMUNITY): Payer: Self-pay | Admitting: Emergency Medicine

## 2018-02-26 ENCOUNTER — Ambulatory Visit (HOSPITAL_COMMUNITY)
Admission: EM | Admit: 2018-02-26 | Discharge: 2018-02-26 | Disposition: A | Discharge status: Home / Self Care | Payer: Self-pay | Attending: Family Medicine | Admitting: Family Medicine

## 2018-02-26 DIAGNOSIS — R21 Rash and other nonspecific skin eruption: Secondary | ICD-10-CM

## 2018-02-26 NOTE — ED Provider Notes (Signed)
[facility]    CSN: [id number] Arrival date & time: 02/26/18  1037     History   Chief Complaint Chief Complaint  Patient presents with  . Rash    HPI Jerry [name] is a 32 y.o. male.   32 year old male with history of sickle cell anemia comes in for 1 week history of rash. States it started a few days after taking oxycodone for pain. Denies itching, shortness of breath, wheezing, swelling of the throat, tripoding, drooling. Has taken oxycodone in the past without similar symptoms. Has taken benadryl without improvement. States rash is now spreading. Denies pain, erythema, increased warmth. Denies fever, chills, night sweats. Denies URI symptoms such as cough, congestion, sore throat.      Past Medical History:  Diagnosis Date  . Sickle cell anemia [facility](HCC)     Patient Active Problem List   Diagnosis Date Noted  . Sickle cell crisis (HCC) 11/11/2017  . Sickle cell pain crisis (HCC) 11/11/2017  . Vitamin D deficiency 04/26/2017  . Problems related to release from prison 01/09/2017  . 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 SICKLE CELL ANEMIA 02/15/2007  . TOBACCO ABUSE 02/15/2007  . MARIJUANA ABUSE 02/15/2007  . 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 No pertinent surgical history.     Home Medications    Prior to Admission medications   Medication Sig Start Date End Date Taking? Authorizing Provider  folic acid (FOLVITE) 1 MG tablet Take 1 tablet (1 mg total) by mouth daily. 04/26/17   Massie MaroonHollis, Lachina M, FNP  hydroxyurea (HYDREA) 500 MG capsule Take 3 capsules (1,500 mg total) by mouth daily. May take with food to minimize GI side effects. 04/26/17   Massie MaroonHollis, Lachina M, FNP  ibuprofen (ADVIL,MOTRIN) 600 MG tablet Take 1 tablet (600 mg total) by mouth every 8 (eight) hours as needed. 02/19/18   Quentin AngstJegede, Olugbemiga E, MD  Oxycodone HCl 10 MG TABS Take 1 tablet (10 mg total) by mouth every 6 (six) hours as needed for up to 15 days.  02/19/18 03/06/18  Quentin AngstJegede, Olugbemiga E, MD    Family History History reviewed. No pertinent family history.  Social History Social History   Tobacco Use  . Smoking status: Never Smoker  . Smokeless tobacco: Never Used  Substance Use Topics  . Alcohol use: No  . Drug use: Yes    Types: Marijuana    Comment: occasionally     Allergies   Patient has no known allergies.   Review of Systems Review of Systems  Reason unable to perform ROS: See HPI as above.     Physical Exam Triage Vital Signs ED Triage Vitals [02/26/18 1136]  Enc Vitals Group     BP 120/89     Pulse Rate 82     Resp 18     Temp 98.3 F (36.8 C)     Temp Source Oral     SpO2 100 %     Weight      Height      Head Circumference      Peak Flow      Pain Score 0     Pain Loc      Pain Edu?      Excl. in GC?    No data found.  Updated Vital Signs BP 120/89 (BP Location: Right Arm)   Pulse 82   Temp 98.3 F (36.8 C) (Oral)   Resp 18   SpO2 100%  Physical Exam  Constitutional: He is oriented to person, place, and time. He appears well-developed and well-nourished. No distress.  HENT:  Head: Normocephalic and atraumatic.  Eyes: Pupils are equal, round, and reactive to light. Conjunctivae are normal.  Neurological: He is alert and oriented to person, place, and time.  Skin: Skin is warm and dry.  See pictures below. No erythema, increased warmth. No tenderness to palpation.             UC Treatments / Results  Labs (all labs ordered are listed, but only abnormal results are displayed) Labs Reviewed - No data to display  EKG None Radiology No results found.  Procedures Procedures (including critical care time)  Medications Ordered in UC Medications - No data to display   Initial Impression / Assessment and Plan / UC Course  I have reviewed the triage vital signs and the nursing notes.  Pertinent labs & imaging results that were available during my care of the patient  were reviewed by me and considered in my medical decision making (see chart for details).    Discussed with patient symptoms inconsistent with allergic reaction. Discussed rash most consistent with pityriasis rosea, will have patient monitor for now. Return precautions given. Patient expresses understanding and agrees to plan.   Case discussed with Dr Milus Glazier, who agrees to plan.   Final Clinical Impressions(s) / UC Diagnoses   Final diagnoses:  Rash    ED Discharge Orders    None        Belinda Fisher, PA-C 02/26/18 1244

## 2018-02-26 NOTE — ED Triage Notes (Signed)
Pt here for rash since taking medication 1 week ago

## 2018-02-26 NOTE — Discharge Instructions (Signed)
Your rash is most consistent with Pityriasis Rosea, this rash does not need treatment and will resolve on its own in about 4-8 weeks. The rash does not look like reaction to medication/foods. Monitor for now. Monitor for spreading redness, increased warmth, fever, follow up for reevaluation. Otherwise, follow up with PCP for recheck as needed.

## 2018-04-25 ENCOUNTER — Ambulatory Visit: Payer: Self-pay | Admitting: Family Medicine

## 2018-05-09 ENCOUNTER — Encounter: Payer: Self-pay | Admitting: Family Medicine

## 2018-05-09 ENCOUNTER — Ambulatory Visit (INDEPENDENT_AMBULATORY_CARE_PROVIDER_SITE_OTHER): Payer: Self-pay | Admitting: Family Medicine

## 2018-05-09 VITALS — BP 114/60 | HR 69 | Temp 97.8°F | Resp 16 | Ht 71.0 in | Wt 176.0 lb

## 2018-05-09 DIAGNOSIS — F17200 Nicotine dependence, unspecified, uncomplicated: Secondary | ICD-10-CM

## 2018-05-09 DIAGNOSIS — Z23 Encounter for immunization: Secondary | ICD-10-CM

## 2018-05-09 DIAGNOSIS — Z79891 Long term (current) use of opiate analgesic: Secondary | ICD-10-CM

## 2018-05-09 DIAGNOSIS — D571 Sickle-cell disease without crisis: Secondary | ICD-10-CM

## 2018-05-09 MED ORDER — OXYCODONE HCL 10 MG PO TABS
10.0000 mg | ORAL_TABLET | Freq: Four times a day (QID) | ORAL | 0 refills | Status: DC | PRN
Start: 2018-05-09 — End: 2018-07-10

## 2018-05-09 MED ORDER — IBUPROFEN 600 MG PO TABS: 600.0000 mg | ORAL_TABLET | Freq: Three times a day (TID) | ORAL | 3 refills | Status: DC | PRN

## 2018-05-09 NOTE — Progress Notes (Signed)
Subjective:    Patient ID: Jerry [name], male    DOB: 05/28/1986, 32 y.o.   MRN: [id number]  HPI Jerry [name], a very pleasant 32 year old male with a history of sickle cell anemia, hemoglobin SS presents for follow-up of sickle cell disease.  Patient states that he has been doing well and has minimal complaints.  He continues to work a very physical job.  He states that he typically has mild low back pain.  He characterizes back pain as intermittent and aching.  Low back pain is aggravated by bending, lifting, and prolonged standing.  Current pain intensity is 1-2/10.  Patient primarily takes ibuprofen 600 mg every 8 hours as needed.  Patient states that he typically takes oxycodone 10 mg every 6 hours for sickle cell crisis.  He is currently out medication.  He typically takes opiates infrequently and limits opiate use to during times of pain crisis.  He denies headache, chest pains, shortness of breath, dysuria, nausea, vomiting, or diarrhea.  Patient continues to take folic acid and hydroxyurea as scheduled.  He is not followed by hematology for sickle cell disease.  Patient is currently up-to-date with vaccinations.  Patient states it is been several years since last eye exam. Past Medical History:  Diagnosis Date  . Sickle cell anemia (HCC)    Social History   Socioeconomic History  . Marital status: Single    Spouse name: Not on file  . Number of children: Not on file  . Years of education: Not on file  . Highest education level: Not on file  Occupational History  . Not on file  Social Needs  . Financial resource strain: Not on file  . Food insecurity:    Worry: Not on file    Inability: Not on file  . Transportation needs:    Medical: Not on file    Non-medical: Not on file  Tobacco Use  . Smoking status: Never Smoker  . Smokeless tobacco: Never Used  Substance and Sexual Activity  . Alcohol use: No  . Drug use: Yes    Types: Marijuana    Comment: occasionally   . Sexual activity: Not on file  Lifestyle  . Physical activity:    Days per week: Not on file    Minutes per session: Not on file  . Stress: Not on file  Relationships  . Social connections:    Talks on phone: Not on file    Gets together: Not on file    Attends religious service: Not on file    Active member of club or organization: Not on file    Attends meetings of clubs or organizations: Not on file    Relationship status: Not on file  . Intimate partner violence:    Fear of current or ex partner: Not on file    Emotionally abused: Not on file    Physically abused: Not on file    Forced sexual activity: Not on file  Other Topics Concern  . Not on file  Social History Narrative  . Not on file   Immunization History  Administered Date(s) Administered  . Tdap 04/26/2017   Review of Systems  Constitutional: Negative.   HENT: Negative.   Eyes: Negative for photophobia and visual disturbance.  Respiratory: Negative.   Cardiovascular: Negative.   Gastrointestinal: Negative.   Endocrine: Negative for polydipsia, polyphagia and polyuria.  Genitourinary: Negative.   Musculoskeletal: Positive for back pain.  Allergic/Immunologic: Negative.   Neurological: Negative.  Hematological: Negative.   Psychiatric/Behavioral: Negative.        Objective:   Physical Exam  Constitutional: He is oriented to person, place, and time. He appears well-developed and well-nourished.  HENT:  Head: Normocephalic and atraumatic.  Eyes: Pupils are equal, round, and reactive to light.  Cardiovascular: Normal rate, regular rhythm and normal heart sounds.  Pulmonary/Chest: Effort normal and breath sounds normal.  Abdominal: Soft. Bowel sounds are normal.  Musculoskeletal: Normal range of motion.  Neurological: He is alert and oriented to person, place, and time.  Skin: Skin is warm and dry.  Psychiatric: He has a normal mood and affect. His behavior is normal. Judgment and thought content  normal.      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 We discussed the need for good hydration, monitoring of hydration status, avoidance of heat, cold, stress, and infection triggers. We discussed the risks and benefits of Hydrea, including bone marrow suppression, the possibility of GI upset, skin ulcers, hair thinning, and teratogenicity. The patient was reminded of the need to seek medical attention of any symptoms of bleeding, anemia, or infection. Continue folic acid 1 mg daily to prevent aplastic bone marrow crises.    Cardiac - Routine screening for pulmonary hypertension is not recommended.  Eye - High risk of proliferative retinopathy. Annual eye exam with retinal exam recommended to patient.   Immunization status - Up to date with immunizations   Acute and chronic painful episodes - We agreed on oxycodone 10 mg every 6 hours as needed. We discussed that pt is to receive his Schedule II prescriptions only from [place]. Pt is also aware that the prescription history is available to [place] online through the [facility] CSRS. Controlled substance agreement signed previously. We reminded Mr. Krummel that all patients receiving Schedule II narcotics must be seen for follow within one month of prescription being requested. We reviewed the terms of our pain agreement, including the need to keep medicines in a safe locked location away from children or pets, and the need to report excess sedation or constipation, measures to avoid constipation, and policies related to early refills and stolen prescriptions. According to the [place] [facility] program, we have reviewed details related to analgesia, adverse effects, aberrant behaviors. - Comprehensive metabolic  panel - CBC with Differential - [id number] 9+OXYCODONE+CRT-UNBUND; Future - ibuprofen (ADVIL,MOTRIN) 600 MG tablet; Take 1 tablet (600 mg total) by mouth every 8 (eight) hours as needed.  Dispense: 60 tablet; Refill: 3 - Oxycodone HCl 10 MG TABS; Take 1 tablet (10 mg total) by mouth every 6 (six) hours as needed.  Dispense: 60 tablet; Refill: 0 - [id number] 9+OXYCODONE+CRT-UNBUND  2. TOBACCO ABUSE Patient smokes 2 to 3 cigarettes/day, he is in pre-contemplative state.  3. Chronic prescription opiate use Reviewed [place] Substance Reporting system prior to prescribing opiate medications. No inconsistencies noted.   - Oxycodone HCl 10 MG TABS; Take 1 tablet (10 mg total) by mouth every 6 (six) hours as needed.  Dispense: 60 tablet; Refill: 0  4. Immunization due - Pneumococcal polysaccharide vaccine 23-valent greater than or equal to 2yo subcutaneous/IM   RTC: 3 months for sickle cell anemia  Donia Pounds  MSN, FNP-C [facility] [address] [place], [place] [address] [phone]

## 2018-05-09 NOTE — Patient Instructions (Signed)
Goals are as follow:   Increase water intake to 64 ounces per day. Recommend a balanced diet divided over small meals throughout the day. Recommend that you continue ibuprofen 600 mg every 8 hours as needed for mild to moderate pain.  Also, take medication with food to prevent stomach upset.  For moderate to severe pain will send in oxycodone 10 mg every 6 hours as needed.  Refrain from drinking, driving, or operating machinery while taking oxycodone.   We will follow-up by phone with any abnormal laboratory results.  Also, follow-[facility] with [facility] to reestablish your payer source.    Also, during periods of sickle cell crisis you can follow up in the day infusion

## 2018-05-10 ENCOUNTER — Other Ambulatory Visit: Payer: Self-pay

## 2018-05-10 DIAGNOSIS — D571 Sickle-cell disease without crisis: Secondary | ICD-10-CM

## 2018-05-10 LAB — COMPREHENSIVE METABOLIC PANEL
A/G RATIO: 1.5 (ref 1.2–2.2)
ALT: 19 IU/L (ref 0–44)
AST: 31 IU/L (ref 0–40)
Albumin: 4.8 g/dL (ref 3.5–5.5)
Alkaline Phosphatase: 110 IU/L (ref 39–117)
BUN/Creatinine Ratio: 12 (ref 9–20)
BUN: 11 mg/dL (ref 6–20)
Bilirubin Total: 2.9 mg/dL — ABNORMAL HIGH (ref 0.0–1.2)
CALCIUM: 9.9 mg/dL (ref 8.7–10.2)
CO2: 24 mmol/L (ref 20–29)
CREATININE: 0.9 mg/dL (ref 0.76–1.27)
Chloride: 103 mmol/L (ref 96–106)
GFR calc Af Amer: 133 mL/min/{1.73_m2} (ref >59)
GFR, EST NON AFRICAN AMERICAN: 115 mL/min/{1.73_m2} (ref >59)
GLUCOSE: 79 mg/dL (ref 65–99)
Globulin, Total: 3.1 g/dL (ref 1.5–4.5)
POTASSIUM: 4.7 mmol/L (ref 3.5–5.2)
Sodium: 141 mmol/L (ref 134–144)
TOTAL PROTEIN: 7.9 g/dL (ref 6.0–8.5)

## 2018-05-10 LAB — CBC WITH DIFFERENTIAL/PLATELET
BASOS: 0 %
Basophils Absolute: 0 10*3/uL (ref 0.0–0.2)
EOS (ABSOLUTE): 0.2 10*3/uL (ref 0.0–0.4)
Eos: 2 %
HEMATOCRIT: 29.3 % — AB (ref 37.5–51.0)
Hemoglobin: 10.2 g/dL — ABNORMAL LOW (ref 13.0–17.7)
IMMATURE GRANULOCYTES: 0 %
Immature Grans (Abs): 0 10*3/uL (ref 0.0–0.1)
Lymphocytes Absolute: 2 10*3/uL (ref 0.7–3.1)
Lymphs: 22 %
MCH: 35.3 pg — ABNORMAL HIGH (ref 26.6–33.0)
MCHC: 34.8 g/dL (ref 31.5–35.7)
MCV: 101 fL — ABNORMAL HIGH (ref 79–97)
MONOCYTES: 14 %
Monocytes Absolute: 1.2 10*3/uL — ABNORMAL HIGH (ref 0.1–0.9)
NEUTROS PCT: 62 %
Neutrophils Absolute: 5.6 10*3/uL (ref 1.4–7.0)
PLATELETS: 358 10*3/uL (ref 150–450)
RBC: 2.89 x10E6/uL — ABNORMAL LOW (ref 4.14–5.80)
RDW: 22.2 % — AB (ref 12.3–15.4)
WBC: 9 10*3/uL (ref 3.4–10.8)

## 2018-05-10 MED ORDER — HYDROXYUREA 500 MG PO CAPS: 1500.0000 mg | ORAL_CAPSULE | Freq: Every day | ORAL | 3 refills | Status: DC

## 2018-05-10 MED ORDER — FOLIC ACID 1 MG PO TABS: 1.0000 mg | ORAL_TABLET | Freq: Every day | ORAL | 3 refills | Status: DC

## 2018-05-16 LAB — 737588 9+OXYCODONE+CRT-UNBUND
Amphetamine Scrn, Ur: NEGATIVE ng/mL
BARBITURATE SCREEN URINE: NEGATIVE ng/mL
BENZODIAZEPINE SCREEN, URINE: NEGATIVE ng/mL
COCAINE(METAB.)SCREEN, URINE: NEGATIVE ng/mL
CREATININE(CRT), U: 87.3 mg/dL (ref 20.0–300.0)
Methadone Screen, Urine: NEGATIVE ng/mL
OXYCODONE+OXYMORPHONE UR QL SCN: NEGATIVE ng/mL
Opiate Scrn, Ur: NEGATIVE ng/mL
PH UR, DRUG SCRN: 5.7 (ref 4.5–8.9)
PHENCYCLIDINE QUANTITATIVE URINE: NEGATIVE ng/mL
Propoxyphene Scrn, Ur: NEGATIVE ng/mL

## 2018-05-16 LAB — CANNABINOID (GC/MS), URINE
Cannabinoid: POSITIVE — AB
Carboxy THC (GC/MS): 74 ng/mL

## 2018-07-10 ENCOUNTER — Encounter: Payer: Self-pay | Admitting: Family Medicine

## 2018-07-10 ENCOUNTER — Ambulatory Visit (INDEPENDENT_AMBULATORY_CARE_PROVIDER_SITE_OTHER): Payer: Self-pay | Admitting: Family Medicine

## 2018-07-10 DIAGNOSIS — D571 Sickle-cell disease without crisis: Secondary | ICD-10-CM

## 2018-07-10 DIAGNOSIS — Z79891 Long term (current) use of opiate analgesic: Secondary | ICD-10-CM

## 2018-07-10 MED ORDER — OXYCODONE HCL 10 MG PO TABS: 10.0000 mg | ORAL_TABLET | Freq: Four times a day (QID) | ORAL | 0 refills | Status: DC | PRN

## 2018-07-10 MED ORDER — HYDROXYUREA 500 MG PO CAPS: 1500.0000 mg | ORAL_CAPSULE | Freq: Every day | ORAL | 3 refills | Status: DC

## 2018-07-10 MED ORDER — FOLIC ACID 1 MG PO TABS: 1.0000 mg | ORAL_TABLET | Freq: Every day | ORAL | 3 refills | Status: DC

## 2018-07-10 MED ORDER — IBUPROFEN 600 MG PO TABS: 600.0000 mg | ORAL_TABLET | Freq: Three times a day (TID) | ORAL | 3 refills | Status: DC | PRN

## 2018-07-10 NOTE — Progress Notes (Signed)
[facility] INTERNAL MEDICINE AND SICKLE CELL CARE  SICKLE CELL ANEMIA FOLLOW UP VISIT PROVIDER: Mike Gipndre Adline Kirshenbaum, FNP    [id number] Jerry Cokearrance K Fait   Subjective :   Past Medical History:  Diagnosis Date  . Sickle cell anemia (HCC)     Social History   Socioeconomic History  . Marital status: Single    Spouse name: Not on file  . Number of children: Not on file  . Years of education: Not on file  . Highest education level: Not on file  Occupational History  . Not on file  Social Needs  . Financial resource strain: Not on file  . Food insecurity:    Worry: Not on file    Inability: Not on file  . Transportation needs:    Medical: Not on file    Non-medical: Not on file  Tobacco Use  . Smoking status: Never Smoker  . Smokeless tobacco: Never Used  Substance and Sexual Activity  . Alcohol use: No  . Drug use: Yes    Types: Marijuana    Comment: occasionally  . Sexual activity: Not on file  Lifestyle  . Physical activity:    Days per week: Not on file    Minutes per session: Not on file  . Stress: Not on file  Relationships  . Social connections:    Talks on phone: Not on file    Gets together: Not on file    Attends religious service: Not on file    Active member of club or organization: Not on file    Attends meetings of clubs or organizations: Not on file    Relationship status: Not on file  . Intimate partner violence:    Fear of current or ex partner: Not on file    Emotionally abused: Not on file    Physically abused: Not on file    Forced sexual activity: Not on file  Other Topics Concern  . Not on file  Social History Narrative  . Not on file    Doing well without concerns today.     Jerry [name]  is a 32 y.o.  male who presents for a follow up for Sickle Cell Anemia. him last hospitalization was 01/2018. he has had 3 hospitalizations in the past 12 months.  Pain regimen includes: Ibuprofen and oxycodone 10mg  Q6H PRN Hydrea  Therapy: Yes  Medication compliance: No Patient states that there was an error at the pharmacy. He was unable to get after last visit   Pain today is 0/10 today.  Patient drinks 80-120 ounces of fluid per day.    Review of Systems  Constitutional: Negative.   HENT: Negative.   Eyes: Negative.   Respiratory: Negative.   Cardiovascular: Negative.   Gastrointestinal: Negative.   Genitourinary: Negative.   Musculoskeletal: Negative.   Skin: Negative.   Neurological: Negative.   Psychiatric/Behavioral: Negative.     Objective  BP 119/61 (BP Location: Left Arm, Patient Position: Sitting, Cuff Size: Large)   Pulse 73   Temp 98.1 F (36.7 C) (Oral)   Resp 16   Ht 5\' 11"  (1.803 m)   Wt 173 lb (78.5 kg)   SpO2 94%   BMI 24.13 kg/m    Physical Exam  Constitutional: He is oriented to person, place, and time. He appears well-developed and well-nourished.  HENT:  Head: Normocephalic and atraumatic.  Right Ear: External ear normal.  Left Ear: External ear normal.  Nose: Nose normal.  Mouth/Throat: Oropharynx is  clear and moist.  Eyes: Pupils are equal, round, and reactive to light. Conjunctivae and EOM are normal.  Neck: Normal range of motion. Neck supple. No tracheal deviation present. No thyromegaly present.  Cardiovascular: Normal rate, regular rhythm, normal heart sounds and intact distal pulses. Exam reveals no friction rub.  No murmur heard. Pulmonary/Chest: Effort normal and breath sounds normal. No stridor. No respiratory distress. He has no wheezes.  Abdominal: Soft. Bowel sounds are normal. He exhibits no distension and no mass. There is no tenderness.  Musculoskeletal: Normal range of motion.  Lymphadenopathy:    He has no cervical adenopathy.  Neurological: He is alert and oriented to person, place, and time.  Skin: Skin is warm and dry.  Psychiatric: He has a normal mood and affect. His behavior is normal. Judgment and thought content normal.  Nursing note and  vitals reviewed.     Assessment   Encounter Diagnoses  Name Primary?  Marland Kitchen Hb-SS disease without crisis (HCC)   . 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  Dispense: 60 tablet; Refill: 0 - hydroxyurea (HYDREA) 500 MG capsule; Take 3 capsules (1,500 mg total) by mouth daily. May take with food to minimize GI side effects.  Dispense: 270 capsule; Refill: 3 - folic acid (FOLVITE) 1 MG tablet; Take 1 tablet (1 mg total) by mouth daily.  Dispense: 90 tablet; Refill: 3 - CBC With Differential - Comprehensive metabolic panel - VITAMIN D 25 Hydroxy (Vit-D Deficiency, Fractures) - Ferritin - ibuprofen (ADVIL,MOTRIN) 600 MG tablet; Take 1 tablet (600 mg total) by mouth every 8 (eight) hours as needed.  Dispense: 60 tablet; Refill: 3  2. Chronic prescription opiate use  - Oxycodone HCl 10 MG TABS; Take 1 tablet (10 mg total) by mouth every 6 (six) hours as needed.  Dispense: 60 tablet; Refill: 0   Return to care as scheduled and prn. Patient verbalized understanding and agreed with plan of care.   1. Sickle cell disease - Continue Hydrea  We discussed the need for good hydration, monitoring of hydration status, avoidance of heat, cold, stress, and infection triggers. We discussed the risks and benefits of Hydrea, including bone marrow suppression, the possibility of GI upset, skin ulcers, hair thinning, and teratogenicity. The patient was reminded of the need to seek medical attention of any symptoms of bleeding, anemia, or infection. Continue folic acid 1 mg daily to prevent aplastic bone marrow crises.   2. Pulmonary evaluation - Patient denies severe recurrent wheezes, shortness of breath with exercise, or persistent cough. If these symptoms develop, pulmonary function tests with spirometry will be ordered, and if abnormal, plan on referral to Pulmonology for  further evaluation.  3. Cardiac - Routine screening for pulmonary hypertension is not recommended.  4. Eye - High risk of proliferative retinopathy. Annual eye exam with retinal exam recommended to patient.  5. Immunization status -  Yearly influenza vaccination is recommended, as well as being up to date with Meningococcal and Pneumococcal vaccines.   6. Acute and chronic painful episodes - We agreed on (Opiate dose: Oxycodone 10mg  po q6h prn  plan) . We discussed that pt is to receive Schedule II prescriptions only from [place]. Pt is also aware that the prescription history is available to [place] online through the [facility] CSRS. Controlled substance agreement signed (Date). We reminded (Pt)  that all patients receiving Schedule II narcotics must be seen for follow within one month of prescription being requested. We reviewed the terms of our pain agreement, including the need to keep medicines in a safe locked location away from children or pets, and the need to report excess sedation or constipation, measures to avoid constipation, and policies related to early refills and stolen prescriptions. According to the  Chronic Pain Initiative program, we have reviewed details related to analgesia, adverse effects, aberrant behaviors.  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  Not applicable. If this occurs will use Exjade for management.   8. Vitamin D deficiency - Drisdol 50,000 units weekly. Patient encouraged to take as prescribed. Has not been taking.   The above recommendations are taken from the NIH Evidence-Based Management of Sickle Cell Disease: Expert Panel Report, [id number].   Ms. [name], FNP-BC [facility] [address]  [place], [place] [id number] [phone]  This note has been created with Dragon speech recognition software and smart phrase technology. Any transcriptional errors are unintentional.

## 2018-07-10 NOTE — Patient Instructions (Signed)
Sickle Cell Anemia, Adult °Sickle cell anemia is a condition where your red blood cells are shaped like sickles. Red blood cells carry oxygen through the body. Sickle-shaped red blood cells do not live as long as normal red blood cells. They also clump together and block blood from flowing through the blood vessels. These things prevent the body from getting enough oxygen. Sickle cell anemia causes organ damage and pain. It also increases the risk of infection. °Follow these instructions at home: °· Drink enough fluid to keep your pee (urine) clear or pale yellow. Drink more in hot weather and during exercise. °· Do not smoke. Smoking lowers oxygen levels in the blood. °· Only take over-the-counter or prescription medicines as told by your doctor. °· Take antibiotic medicines as told by your doctor. Make sure you finish them even if you start to feel better. °· Take supplements as told by your doctor. °· Consider wearing a medical alert bracelet. This tells anyone caring for you in an emergency of your condition. °· When traveling, keep your medical information, doctors' names, and the medicines you take with you at all times. °· If you have a fever, do not take fever medicines right away. This could cover up a problem. Tell your doctor. °· Keep all follow-up visits with your doctor. Sickle cell anemia requires regular medical care. °Contact a doctor if: °You have a fever. °Get help right away if: °· You feel dizzy or faint. °· You have new belly (abdominal) pain, especially on the left side near the stomach area. °· You have a lasting, often uncomfortable and painful erection of the penis (priapism). If it is not treated right away, you will become unable to have sex (impotence). °· You have numbness in your arms or legs or you have a hard time moving them. °· You have a hard time talking. °· You have a fever or lasting symptoms for more than 2-3 days. °· You have a fever and your symptoms suddenly get  worse. °· You have signs or symptoms of infection. These include: °? Chills. °? Being more tired than normal (lethargy). °? Irritability. °? Poor eating. °? Throwing up (vomiting). °· You have pain that is not helped with medicine. °· You have shortness of breath. °· You have pain in your chest. °· You are coughing up pus-like or bloody mucus. °· You have a stiff neck. °· Your feet or hands swell or have pain. °· Your belly looks bloated. °· Your joints hurt. °This information is not intended to replace advice given to you by your health care provider. Make sure you discuss any questions you have with your health care provider. °Document Released: 09/03/2013 Document Revised: 04/20/2016 Document Reviewed: 06/25/2013 °Elsevier Interactive Patient Education © 2017 Elsevier Inc. ° °

## 2018-07-11 LAB — CBC WITH DIFFERENTIAL
Basophils Absolute: 0 10*3/uL (ref 0.0–0.2)
Basos: 0 %
EOS (ABSOLUTE): 0.2 10*3/uL (ref 0.0–0.4)
Eos: 1 %
Hematocrit: 27 % — ABNORMAL LOW (ref 37.5–51.0)
Hemoglobin: 9.3 g/dL — ABNORMAL LOW (ref 13.0–17.7)
Immature Grans (Abs): 0.1 10*3/uL (ref 0.0–0.1)
Immature Granulocytes: 1 %
Lymphocytes Absolute: 4.9 10*3/uL — ABNORMAL HIGH (ref 0.7–3.1)
Lymphs: 39 %
MCH: 33.2 pg — ABNORMAL HIGH (ref 26.6–33.0)
MCHC: 34.4 g/dL (ref 31.5–35.7)
MCV: 96 fL (ref 79–97)
Monocytes Absolute: 1.4 10*3/uL — ABNORMAL HIGH (ref 0.1–0.9)
Monocytes: 11 %
NRBC: 21 % — ABNORMAL HIGH (ref 0–0)
Neutrophils Absolute: 6 10*3/uL (ref 1.4–7.0)
Neutrophils: 48 %
RBC: 2.8 x10E6/uL — ABNORMAL LOW (ref 4.14–5.80)
RDW: 24.8 % — ABNORMAL HIGH (ref 12.3–15.4)
WBC: 12.5 10*3/uL — ABNORMAL HIGH (ref 3.4–10.8)

## 2018-07-11 LAB — FERRITIN: Ferritin: 850 ng/mL — ABNORMAL HIGH (ref 30–400)

## 2018-07-11 LAB — COMPREHENSIVE METABOLIC PANEL
ALT: 19 IU/L (ref 0–44)
AST: 31 IU/L (ref 0–40)
Albumin/Globulin Ratio: 1.7 (ref 1.2–2.2)
Albumin: 4.7 g/dL (ref 3.5–5.5)
Alkaline Phosphatase: 100 IU/L (ref 39–117)
BUN/Creatinine Ratio: 14 (ref 9–20)
BUN: 12 mg/dL (ref 6–20)
Bilirubin Total: 3.4 mg/dL — ABNORMAL HIGH (ref 0.0–1.2)
CO2: 23 mmol/L (ref 20–29)
Calcium: 9.8 mg/dL (ref 8.7–10.2)
Chloride: 103 mmol/L (ref 96–106)
Creatinine, Ser: 0.87 mg/dL (ref 0.76–1.27)
GFR calc Af Amer: 135 mL/min/{1.73_m2} (ref >59)
GFR calc non Af Amer: 117 mL/min/{1.73_m2} (ref >59)
Globulin, Total: 2.8 g/dL (ref 1.5–4.5)
Glucose: 75 mg/dL (ref 65–99)
Potassium: 4.7 mmol/L (ref 3.5–5.2)
Sodium: 140 mmol/L (ref 134–144)
Total Protein: 7.5 g/dL (ref 6.0–8.5)

## 2018-07-11 LAB — VITAMIN D 25 HYDROXY (VIT D DEFICIENCY, FRACTURES): Vit D, 25-Hydroxy: 13.1 ng/mL — ABNORMAL LOW (ref 30.0–100.0)

## 2018-07-12 ENCOUNTER — Other Ambulatory Visit: Payer: Self-pay | Admitting: Family Medicine

## 2018-07-12 ENCOUNTER — Telehealth: Payer: Self-pay

## 2018-07-12 MED ORDER — VITAMIN D (ERGOCALCIFEROL) 1.25 MG (50000 UNIT) PO CAPS: 50000.0000 [IU] | ORAL_CAPSULE | ORAL | 1 refills | Status: AC

## 2018-07-12 NOTE — Telephone Encounter (Signed)
-----   Message from Mike GipAndre Douglas, FNP sent at 07/12/2018  8:29 AM EDT ----- The  labs are stable without significant clinical change.  All other results are normal or within acceptable limits. Will send in vitamin D supplement.

## 2018-07-12 NOTE — Telephone Encounter (Signed)
Called, no answer. Left a message that all labs were stable without significant change. Advised that vitamin D was low and we are sending in vitamin D supplement to take once weekly as prescribed. Asked to keep next scheduled appointment and call if any questions. Thanks!

## 2018-08-07 ENCOUNTER — Ambulatory Visit (INDEPENDENT_AMBULATORY_CARE_PROVIDER_SITE_OTHER): Payer: Self-pay | Admitting: Family Medicine

## 2018-08-07 ENCOUNTER — Encounter: Payer: Self-pay | Admitting: Family Medicine

## 2018-08-07 VITALS — BP 108/60 | HR 81 | Temp 98.0°F | Resp 16 | Ht 71.0 in | Wt 179.0 lb

## 2018-08-07 DIAGNOSIS — Z23 Encounter for immunization: Secondary | ICD-10-CM

## 2018-08-07 DIAGNOSIS — D571 Sickle-cell disease without crisis: Secondary | ICD-10-CM

## 2018-08-07 NOTE — Patient Instructions (Signed)
Sickle Cell Anemia, Adult °Sickle cell anemia is a condition where your red blood cells are shaped like sickles. Red blood cells carry oxygen through the body. Sickle-shaped red blood cells do not live as long as normal red blood cells. They also clump together and block blood from flowing through the blood vessels. These things prevent the body from getting enough oxygen. Sickle cell anemia causes organ damage and pain. It also increases the risk of infection. °Follow these instructions at home: °· Drink enough fluid to keep your pee (urine) clear or pale yellow. Drink more in hot weather and during exercise. °· Do not smoke. Smoking lowers oxygen levels in the blood. °· Only take over-the-counter or prescription medicines as told by your doctor. °· Take antibiotic medicines as told by your doctor. Make sure you finish them even if you start to feel better. °· Take supplements as told by your doctor. °· Consider wearing a medical alert bracelet. This tells anyone caring for you in an emergency of your condition. °· When traveling, keep your medical information, doctors' names, and the medicines you take with you at all times. °· If you have a fever, do not take fever medicines right away. This could cover up a problem. Tell your doctor. °· Keep all follow-up visits with your doctor. Sickle cell anemia requires regular medical care. °Contact a doctor if: °You have a fever. °Get help right away if: °· You feel dizzy or faint. °· You have new belly (abdominal) pain, especially on the left side near the stomach area. °· You have a lasting, often uncomfortable and painful erection of the penis (priapism). If it is not treated right away, you will become unable to have sex (impotence). °· You have numbness in your arms or legs or you have a hard time moving them. °· You have a hard time talking. °· You have a fever or lasting symptoms for more than 2-3 days. °· You have a fever and your symptoms suddenly get  worse. °· You have signs or symptoms of infection. These include: °? Chills. °? Being more tired than normal (lethargy). °? Irritability. °? Poor eating. °? Throwing up (vomiting). °· You have pain that is not helped with medicine. °· You have shortness of breath. °· You have pain in your chest. °· You are coughing up pus-like or bloody mucus. °· You have a stiff neck. °· Your feet or hands swell or have pain. °· Your belly looks bloated. °· Your joints hurt. °This information is not intended to replace advice given to you by your health care provider. Make sure you discuss any questions you have with your health care provider. °Document Released: 09/03/2013 Document Revised: 04/20/2016 Document Reviewed: 06/25/2013 °Elsevier Interactive Patient Education © 2017 Elsevier Inc. ° °

## 2018-08-07 NOTE — Progress Notes (Signed)
PATIENT CARE CENTER INTERNAL MEDICINE AND SICKLE CELL CARE  SICKLE CELL ANEMIA FOLLOW UP VISIT PROVIDER: Mike Gip, FNP    Subjective:   Jerry [name]  is a 32 y.o.  male who  has a past medical history of Sickle cell anemia (HCC). presents for a follow up for Sickle Cell Anemia, FMLA paperwork and questions about HPV vaccination.  his last hospitalization was 02/15/2018. he has had less than 6 hospitalizations in the past 12 months.  Presents today for [employer] paperwork.  Pain regimen includes: Ibuprofen and oxycodone 10 mg PO  Hydrea Therapy: Yes Medication compliance: Yes  Pain today is 3/10  Patient reports adequate hydration.     Review of Systems  Constitutional: Negative.   HENT: Negative.   Eyes: Negative.   Respiratory: Negative.   Cardiovascular: Negative.   Gastrointestinal: Negative.   Genitourinary: Negative.   Musculoskeletal: Positive for myalgias.  Skin: Negative.   Neurological: Negative.   Psychiatric/Behavioral: Negative.     Objective:   Objective  BP 108/60 (BP Location: Left Arm, Patient Position: Sitting, Cuff Size: Normal)   Pulse 81   Temp 98 F (36.7 C) (Oral)   Resp 16   Ht 5\' 11"  (1.803 m)   Wt 179 lb (81.2 kg)   SpO2 94%   BMI 24.97 kg/m    Physical Exam  Constitutional: He is oriented to person, place, and time. He appears well-developed and well-nourished. No distress.  HENT:  Head: Normocephalic and atraumatic.  Eyes: Pupils are equal, round, and reactive to light. Conjunctivae and EOM are normal.  Neck: Normal range of motion.  Cardiovascular: Normal rate, regular rhythm, normal heart sounds and intact distal pulses.  Pulmonary/Chest: Effort normal and breath sounds normal. No respiratory distress.  Abdominal: Soft. Bowel sounds are normal. He exhibits no distension.  Musculoskeletal: Normal range of motion.  Neurological: He is alert and oriented to person, place, and time.  Skin: Skin is warm and dry.    Psychiatric: He has a normal mood and affect. His behavior is normal. Thought content normal.  Nursing note and vitals reviewed.    Assessment/Plan:   Assessment   Encounter Diagnosis  Name Primary?  . Need for immunization against influenza      Plan  1. Need for immunization against influenza Influenza vaccination given in the office visit today. - Flu Vaccine QUAD 36+ mos IM  2. 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 Paperwork filled out.  Patient would like to stop hydrea in the future for reproductive purposes. We discussed Endari. Patient states "I have to get my body prepared first"  - CBC with Differential - Comprehensive metabolic panel - Vitamin D, 25-hydroxy  3. Need for HPV vaccine - HPV 9-valent vaccine,Recombinat    Return to care as scheduled and prn. Patient verbalized understanding and agreed with plan of care.   1. Sickle cell disease - Continue Hydrea   We discussed the need for good hydration, monitoring of hydration status, avoidance of heat, cold, stress, and infection triggers. We discussed the risks and benefits of Hydrea, including bone marrow suppression, the possibility of GI upset, skin ulcers, hair thinning, and teratogenicity. The patient was reminded of the need to seek medical attention of any symptoms of bleeding, anemia, or infection. Continue folic acid 1 mg daily to prevent aplastic bone marrow crises.   2. Pulmonary evaluation - Patient denies severe recurrent wheezes, shortness of breath with exercise, or  persistent cough. If these symptoms develop, pulmonary function tests with spirometry will be ordered, and if abnormal, plan on referral to Pulmonology for further evaluation.  3. Cardiac - Routine screening for pulmonary hypertension is not recommended.  4. Eye - High risk of proliferative retinopathy. Annual eye exam with retinal exam recommended to  patient.  5. Immunization status -  Yearly influenza vaccination is recommended, as well as being up to date with Meningococcal and Pneumococcal vaccines.   6. Acute and chronic painful episodes - We discussed that pt is to receive Schedule II prescriptions only from [place]. Pt is also aware that the prescription history is available to [place] online through the [facility] CSRS. Controlled substance agreement signed . We reminded Jerry [name] that all patients receiving Schedule II narcotics must be seen for follow within one month of prescription being requested. We reviewed the terms of our pain agreement, including the need to keep medicines in a safe locked location away from children or pets, and the need to report excess sedation or constipation, measures to avoid constipation, and policies related to early refills and stolen prescriptions. According to the [place] Chronic Pain Initiative program, we have reviewed details related to analgesia, adverse effects, aberrant behaviors.  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  Not applicable at this time.  If this occurs will use Exjade for management.   8. Vitamin D deficiency - Jerry [name] 50,000 units weekly. Patient encouraged to take as prescribed.   The above recommendations are taken from the NIH Evidence-Based Management of Sickle Cell Disease: Expert Panel Report, 96789.   Ms. Andr L. Riley Lam, FNP-BC Patient Care Center [facility] [address] [place], [place] [address] [phone]  This note has been created with Dragon speech recognition software and smart phrase technology. Any transcriptional errors are unintentional.

## 2018-08-08 ENCOUNTER — Ambulatory Visit: Payer: Medicaid Other | Admitting: Family Medicine

## 2018-08-08 LAB — COMPREHENSIVE METABOLIC PANEL
ALT: 16 IU/L (ref 0–44)
AST: 26 IU/L (ref 0–40)
Albumin/Globulin Ratio: 1.6 (ref 1.2–2.2)
Albumin: 4.5 g/dL (ref 3.5–5.5)
Alkaline Phosphatase: 78 IU/L (ref 39–117)
BUN/Creatinine Ratio: 9 (ref 9–20)
BUN: 8 mg/dL (ref 6–20)
Bilirubin Total: 1.8 mg/dL — ABNORMAL HIGH (ref 0.0–1.2)
CO2: 23 mmol/L (ref 20–29)
Calcium: 9.6 mg/dL (ref 8.7–10.2)
Chloride: 106 mmol/L (ref 96–106)
Creatinine, Ser: 0.94 mg/dL (ref 0.76–1.27)
GFR calc Af Amer: 125 mL/min/{1.73_m2} (ref >59)
GFR calc non Af Amer: 108 mL/min/{1.73_m2} (ref >59)
Globulin, Total: 2.9 g/dL (ref 1.5–4.5)
Glucose: 81 mg/dL (ref 65–99)
Potassium: 4.2 mmol/L (ref 3.5–5.2)
Sodium: 142 mmol/L (ref 134–144)
Total Protein: 7.4 g/dL (ref 6.0–8.5)

## 2018-08-08 LAB — CBC WITH DIFFERENTIAL/PLATELET
Basophils Absolute: 0 10*3/uL (ref 0.0–0.2)
Basos: 0 %
EOS (ABSOLUTE): 0.1 10*3/uL (ref 0.0–0.4)
Eos: 2 %
Hematocrit: 21.6 % — ABNORMAL LOW (ref 37.5–51.0)
Hemoglobin: 7.2 g/dL — ABNORMAL LOW (ref 13.0–17.7)
Immature Grans (Abs): 0.1 10*3/uL (ref 0.0–0.1)
Immature Granulocytes: 1 %
Lymphocytes Absolute: 2 10*3/uL (ref 0.7–3.1)
Lymphs: 28 %
MCH: 32.1 pg (ref 26.6–33.0)
MCHC: 33.3 g/dL (ref 31.5–35.7)
MCV: 96 fL (ref 79–97)
Monocytes Absolute: 0.7 10*3/uL (ref 0.1–0.9)
Monocytes: 10 %
NRBC: 5 % — ABNORMAL HIGH (ref 0–0)
Neutrophils Absolute: 4.3 10*3/uL (ref 1.4–7.0)
Neutrophils: 59 %
Platelets: 579 10*3/uL — ABNORMAL HIGH (ref 150–450)
RBC: 2.24 x10E6/uL — CL (ref 4.14–5.80)
RDW: 23.6 % — ABNORMAL HIGH (ref 12.3–15.4)
WBC: 7.2 10*3/uL (ref 3.4–10.8)

## 2018-08-08 LAB — VITAMIN D 25 HYDROXY (VIT D DEFICIENCY, FRACTURES): Vit D, 25-Hydroxy: 13.8 ng/mL — ABNORMAL LOW (ref 30.0–100.0)

## 2018-08-12 ENCOUNTER — Telehealth: Payer: Self-pay

## 2018-08-12 NOTE — Telephone Encounter (Signed)
-----   Message from Mike GipAndre Douglas, FNP sent at 08/08/2018  5:21 PM EDT ----- Low Vitamin D. All other labs are stable. I will send Vit D to the pharmacy. After completion of the prescription, patient will need otc vitamin D of 1,000units daily.

## 2018-08-12 NOTE — Telephone Encounter (Signed)
Called, no answer and no voicemail. Will try later Thanks!  

## 2018-08-13 NOTE — Telephone Encounter (Signed)
Called, no answer and no voicemail.  

## 2018-08-15 ENCOUNTER — Telehealth: Payer: Self-pay

## 2018-08-15 DIAGNOSIS — Z79891 Long term (current) use of opiate analgesic: Secondary | ICD-10-CM

## 2018-08-15 DIAGNOSIS — D571 Sickle-cell disease without crisis: Secondary | ICD-10-CM

## 2018-08-15 MED ORDER — OXYCODONE HCL 10 MG PO TABS: 10.0000 mg | ORAL_TABLET | Freq: Four times a day (QID) | ORAL | 0 refills | Status: DC | PRN

## 2018-08-15 NOTE — Telephone Encounter (Signed)
sent 

## 2018-09-03 ENCOUNTER — Telehealth: Payer: Self-pay

## 2018-09-03 NOTE — Telephone Encounter (Signed)
Called to verify appointment for 09/04/2018. No answer and no voicemail set up. Thanks!

## 2018-09-04 ENCOUNTER — Ambulatory Visit: Payer: Self-pay | Admitting: Family Medicine

## 2018-10-01 ENCOUNTER — Other Ambulatory Visit: Payer: Self-pay

## 2018-10-01 ENCOUNTER — Emergency Department (HOSPITAL_COMMUNITY)
Admission: EM | Admit: 2018-10-01 | Discharge: 2018-10-01 | Disposition: A | Discharge status: Home / Self Care | Payer: Self-pay | Attending: Emergency Medicine | Admitting: Emergency Medicine

## 2018-10-01 ENCOUNTER — Emergency Department (HOSPITAL_COMMUNITY): Payer: Self-pay

## 2018-10-01 ENCOUNTER — Encounter (HOSPITAL_COMMUNITY): Payer: Self-pay | Admitting: Emergency Medicine

## 2018-10-01 DIAGNOSIS — R112 Nausea with vomiting, unspecified: Secondary | ICD-10-CM | POA: Insufficient documentation

## 2018-10-01 DIAGNOSIS — J3489 Other specified disorders of nose and nasal sinuses: Secondary | ICD-10-CM | POA: Insufficient documentation

## 2018-10-01 DIAGNOSIS — R0981 Nasal congestion: Secondary | ICD-10-CM | POA: Insufficient documentation

## 2018-10-01 DIAGNOSIS — D5700 Hb-SS disease with crisis, unspecified: Secondary | ICD-10-CM | POA: Insufficient documentation

## 2018-10-01 DIAGNOSIS — Z79899 Other long term (current) drug therapy: Secondary | ICD-10-CM | POA: Insufficient documentation

## 2018-10-01 DIAGNOSIS — F1210 Cannabis abuse, uncomplicated: Secondary | ICD-10-CM | POA: Insufficient documentation

## 2018-10-01 DIAGNOSIS — R05 Cough: Secondary | ICD-10-CM | POA: Insufficient documentation

## 2018-10-01 LAB — CBC WITH DIFFERENTIAL/PLATELET
BASOS ABS: 0 10*3/uL (ref 0.0–0.1)
BASOS PCT: 0 %
Band Neutrophils: 2 %
EOS PCT: 0 %
Eosinophils Absolute: 0 10*3/uL (ref 0.0–0.5)
HCT: 27.1 % — ABNORMAL LOW (ref 39.0–52.0)
Hemoglobin: 9.2 g/dL — ABNORMAL LOW (ref 13.0–17.0)
LYMPHS ABS: 4.2 10*3/uL — AB (ref 0.7–4.0)
Lymphocytes Relative: 37 %
MCH: 32.5 pg (ref 26.0–34.0)
MCHC: 33.9 g/dL (ref 30.0–36.0)
MCV: 95.8 fL (ref 80.0–100.0)
MYELOCYTES: 2 %
Metamyelocytes Relative: 2 %
Monocytes Absolute: 1 10*3/uL (ref 0.1–1.0)
Monocytes Relative: 9 %
NEUTROS PCT: 48 %
Neutro Abs: 6.1 10*3/uL (ref 1.7–7.7)
Platelets: 475 10*3/uL — ABNORMAL HIGH (ref 150–400)
RBC: 2.83 MIL/uL — ABNORMAL LOW (ref 4.22–5.81)
RDW: 25.2 % — ABNORMAL HIGH (ref 11.5–15.5)
WBC: 11.3 10*3/uL — AB (ref 4.0–10.5)
nRBC: 6.5 % — ABNORMAL HIGH (ref 0.0–0.2)
nRBC: 7 /100 WBC — ABNORMAL HIGH

## 2018-10-01 LAB — COMPREHENSIVE METABOLIC PANEL
ALT: 19 U/L (ref 0–44)
AST: 41 U/L (ref 15–41)
Albumin: 4.4 g/dL (ref 3.5–5.0)
Alkaline Phosphatase: 89 U/L (ref 38–126)
Anion gap: 8 (ref 5–15)
BUN: 12 mg/dL (ref 6–20)
CHLORIDE: 105 mmol/L (ref 98–111)
CO2: 27 mmol/L (ref 22–32)
Calcium: 9.5 mg/dL (ref 8.9–10.3)
Creatinine, Ser: 1.01 mg/dL (ref 0.61–1.24)
GFR calc Af Amer: >60 mL/min (ref >60)
Glucose, Bld: 100 mg/dL — ABNORMAL HIGH (ref 70–99)
POTASSIUM: 4.2 mmol/L (ref 3.5–5.1)
Sodium: 140 mmol/L (ref 135–145)
Total Bilirubin: 2.8 mg/dL — ABNORMAL HIGH (ref 0.3–1.2)
Total Protein: 7.9 g/dL (ref 6.5–8.1)

## 2018-10-01 LAB — RETICULOCYTES
IMMATURE RETIC FRACT: 39.5 % — AB (ref 2.3–15.9)
RBC.: 2.83 MIL/uL — ABNORMAL LOW (ref 4.22–5.81)
RETIC COUNT ABSOLUTE: 611.6 10*3/uL — AB (ref 19.0–186.0)
Retic Ct Pct: 21.6 % — ABNORMAL HIGH (ref 0.4–3.1)

## 2018-10-01 LAB — TROPONIN I: Troponin I: <0.03 ng/mL (ref <0.03)

## 2018-10-01 LAB — INFLUENZA PANEL BY PCR (TYPE A & B)
INFLBPCR: NEGATIVE
Influenza A By PCR: NEGATIVE

## 2018-10-01 MED ORDER — KETOROLAC TROMETHAMINE 30 MG/ML IJ SOLN
30.0000 mg | INTRAMUSCULAR | Status: AC
  Administered 2018-10-01: 30 mg via INTRAVENOUS
  Filled 2018-10-01: qty 1

## 2018-10-01 MED ORDER — SODIUM CHLORIDE 0.45 % IV BOLUS: 1000.0000 mL | Freq: Once | INTRAVENOUS | Status: DC

## 2018-10-01 MED ORDER — HYDROMORPHONE HCL 1 MG/ML IJ SOLN
1.0000 mg | INTRAMUSCULAR | Status: AC
  Administered 2018-10-01: 1 mg via INTRAVENOUS

## 2018-10-01 MED ORDER — OXYCODONE HCL 10 MG PO TABS: 10.0000 mg | ORAL_TABLET | Freq: Four times a day (QID) | ORAL | 0 refills | Status: DC | PRN

## 2018-10-01 MED ORDER — PREDNISONE 10 MG (21) PO TBPK: ORAL_TABLET | ORAL | 0 refills | Status: DC

## 2018-10-01 MED ORDER — HYDROMORPHONE HCL 1 MG/ML IJ SOLN
1.0000 mg | INTRAMUSCULAR | Status: AC
  Filled 2018-10-01: qty 1

## 2018-10-01 MED ORDER — HYDROMORPHONE HCL 1 MG/ML IJ SOLN: 0.5000 mg | INTRAMUSCULAR | Status: AC

## 2018-10-01 MED ORDER — HYDROMORPHONE HCL 1 MG/ML IJ SOLN: 1.0000 mg | INTRAMUSCULAR | Status: AC

## 2018-10-01 MED ORDER — BENZONATATE 100 MG PO CAPS: 100.0000 mg | ORAL_CAPSULE | Freq: Three times a day (TID) | ORAL | 0 refills | Status: DC

## 2018-10-01 MED ORDER — METOCLOPRAMIDE HCL 5 MG/ML IJ SOLN
10.0000 mg | Freq: Once | INTRAMUSCULAR | Status: AC
  Administered 2018-10-01: 10 mg via INTRAVENOUS
  Filled 2018-10-01: qty 2

## 2018-10-01 MED ORDER — ONDANSETRON HCL 4 MG/2ML IJ SOLN
4.0000 mg | INTRAMUSCULAR | Status: DC | PRN
  Administered 2018-10-01: 4 mg via INTRAVENOUS
  Filled 2018-10-01: qty 2

## 2018-10-01 MED ORDER — DIPHENHYDRAMINE HCL 50 MG/ML IJ SOLN
25.0000 mg | Freq: Once | INTRAMUSCULAR | Status: AC
  Administered 2018-10-01: 25 mg via INTRAVENOUS
  Filled 2018-10-01: qty 1

## 2018-10-01 MED ORDER — HYDROMORPHONE HCL 1 MG/ML IJ SOLN: 1.0000 mg | INTRAMUSCULAR | Status: DC

## 2018-10-01 MED ORDER — HYDROMORPHONE HCL 1 MG/ML IJ SOLN
2.0000 mg | INTRAMUSCULAR | Status: AC
  Administered 2018-10-01 (×2): 2 mg via INTRAVENOUS
  Filled 2018-10-01 (×2): qty 2

## 2018-10-01 MED ORDER — HYDROMORPHONE HCL 1 MG/ML IJ SOLN
1.0000 mg | INTRAMUSCULAR | Status: AC
  Administered 2018-10-01: 1 mg via INTRAVENOUS
  Filled 2018-10-01: qty 1

## 2018-10-01 MED ORDER — METHYLPREDNISOLONE SODIUM SUCC 125 MG IJ SOLR
125.0000 mg | Freq: Once | INTRAMUSCULAR | Status: AC
  Administered 2018-10-01: 125 mg via INTRAVENOUS
  Filled 2018-10-01: qty 2

## 2018-10-01 NOTE — Discharge Instructions (Addendum)
°  Your symptoms are likely consistent with a viral illness. Viruses do not require or respond to antibiotics. Treatment is symptomatic care and it is important to note that these symptoms may last for 7-14 days.   Hand washing: Wash your hands throughout the day, but especially before and after touching the face, using the restroom, sneezing, coughing, or touching surfaces that have been coughed or sneezed upon. Hydration: Symptoms will be intensified and complicated by dehydration. Dehydration can also extend the duration of symptoms. Drink plenty of fluids and get plenty of rest. You should be drinking at least half a liter of water an hour to stay hydrated. Electrolyte drinks (ex. Gatorade, Powerade, Pedialyte) are also encouraged. You should be drinking enough fluids to make your urine light yellow, almost clear. If this is not the case, you are not drinking enough water. Please note that some of the treatments indicated below will not be effective if you are not adequately hydrated. Pain or fever: Ibuprofen, Naproxen, or acetaminophen (generic for Tylenol) for pain or fever.  Cough: Use the benzonatate (generic for Tessalon) for cough.  Prednisone: Take the prednisone, as directed, in its entirety. Zyrtec or Claritin: May add these medication daily to control underlying symptoms of congestion, sneezing, and other signs of allergies.  These medications are available over-the-counter. Generics: Cetirizine (generic for Zyrtec) and loratadine (generic for Claritin). Fluticasone: Use fluticasone (generic for Flonase), as directed, for nasal and sinus congestion.  This medication is available over-the-counter. Congestion: Plain guaifenesin (generic for plain Mucinex) may help relieve congestion. Saline sinus rinses and saline nasal sprays may also help relieve congestion. Sore throat: Warm liquids or Chloraseptic spray may help soothe a sore throat. Gargle twice a day with a salt water solution made from a  half teaspoon of salt in a cup of warm water.  Follow up: Follow up with a primary care provider within the next two weeks should symptoms fail to resolve. Return: Return to the ED for significantly worsening symptoms, shortness of breath, persistent vomiting, large amounts of blood in stool, or any other major concerns.  For prescription assistance, may try using prescription discount sites or apps, such as goodrx.com

## 2018-10-01 NOTE — ED Provider Notes (Signed)
[facility] EMERGENCY DEPARTMENT Provider Note   CSN: [id number] Arrival date & time: 10/01/18  4098     History   Chief Complaint Chief Complaint  Patient presents with  . Generalized Body Aches    HPI Jerry [name] is a 32 y.o. male.  HPI   Jerry [name] is a 32 y.o. male, with a history of sickle cell anemia, presenting to the ED with sickle cell pain. Diffuse body pain, "feels stretched" across chest and back, throbbing pain throughout body, 7/10.  States his pain started in the back and spread throughout other areas of his body, which is typical for his sickle cell crisis pain. Nasal congestion, rhinnorrhea, and cough for about 10 days. He had one episode of nausea and vomiting yesterday, but none since. States he ran out of his oxycodone about 3 weeks ago and has not yet followed up with his sickle cell provider. Denies fever/chills, abdominal pain, urinary symptoms, shortness of breath, syncope, rash, neck pain/stiffness, or any other complaints.     Past Medical History:  Diagnosis Date  . Sickle cell anemia [facility])     Patient Active Problem List   Diagnosis Date Noted  . Sickle cell pain crisis (HCC) 11/11/2017  . Vitamin D deficiency 04/26/2017  . Problems related to release from prison 01/09/2017  . 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 SICKLE CELL ANEMIA 02/15/2007  . TOBACCO ABUSE 02/15/2007  . MARIJUANA ABUSE 02/15/2007  . 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 No pertinent surgical history.      Home Medications    Prior to Admission medications   Medication Sig Start Date End Date Taking? Authorizing Provider  folic acid (FOLVITE) 1 MG tablet Take 1 tablet (1 mg total) by mouth daily. 07/10/18  Yes Mike Gip, FNP  hydroxyurea (HYDREA) 500 MG capsule Take 3 capsules (1,500 mg total) by mouth daily. May take with food to minimize GI side effects. 07/10/18  Yes Mike Gip, FNP  ibuprofen  (ADVIL,MOTRIN) 600 MG tablet Take 1 tablet (600 mg total) by mouth every 8 (eight) hours as needed. 07/10/18  Yes Mike Gip, FNP  Oxycodone HCl 10 MG TABS Take 1 tablet (10 mg total) by mouth every 6 (six) hours as needed. 08/15/18  Yes Mike Gip, FNP  benzonatate (TESSALON) 100 MG capsule Take 1 capsule (100 mg total) by mouth every 8 (eight) hours. 10/01/18   Annamaria Salah C, PA-C  Oxycodone HCl 10 MG TABS Take 1 tablet (10 mg total) by mouth every 6 (six) hours as needed. 10/01/18   Sherald Balbuena C, PA-C  predniSONE (STERAPRED UNI-PAK 21 TAB) 10 MG (21) TBPK tablet Take 6 tabs (60mg ) day 1, 5 tabs (50mg ) day 2, 4 tabs (40mg ) day 3, 3 tabs (30mg ) day 4, 2 tabs (20mg ) day 5, and 1 tab (10mg ) day 6. 10/01/18   Toya Palacios, Hillard Danker, PA-C    Family History No family history on file.  Social History Social History   Tobacco Use  . Smoking status: Never Smoker  . Smokeless tobacco: Never Used  Substance Use Topics  . Alcohol use: No  . Drug use: Yes    Types: Marijuana    Comment: occasionally     Allergies   Patient has no known allergies.   Review of Systems Review of Systems  Constitutional: Negative for chills, diaphoresis and fever.  HENT: Positive for congestion and rhinorrhea. Negative for sore throat and trouble swallowing.   Respiratory: Positive  for cough. Negative for shortness of breath.   Cardiovascular: Negative for leg swelling.  Gastrointestinal: Positive for nausea (resolved) and vomiting (resolved). Negative for abdominal pain and blood in stool.  Genitourinary: Negative for dysuria, flank pain and hematuria.  Musculoskeletal: Positive for back pain. Negative for neck pain and neck stiffness.       Pain and tenderness throughout the chest, back, and extremities  Skin: Negative for rash.  Neurological: Positive for headaches. Negative for syncope, weakness and numbness.  All other systems reviewed and are negative.    Physical Exam Updated Vital Signs BP 111/79    Pulse 87   Temp 98.5 F (36.9 C)   Resp 17   Ht 5\' 11"  (1.803 m)   Wt 86.2 kg   SpO2 96%   BMI 26.50 kg/m   Physical Exam  Constitutional: He appears well-developed and well-nourished. No distress.  HENT:  Head: Normocephalic and atraumatic.  Mouth/Throat: Oropharynx is clear and moist.  Eyes: Conjunctivae are normal.  Neck: Normal range of motion. Neck supple.  Cardiovascular: Normal rate, regular rhythm, normal heart sounds and intact distal pulses.  Pulmonary/Chest: Effort normal and breath sounds normal. No respiratory distress.  No increased work of breathing.  Speaks in full sentences without difficulty.  Abdominal: Soft. There is no tenderness. There is no guarding.  Musculoskeletal: He exhibits tenderness. He exhibits no edema.  Patient has diffuse tenderness throughout the extremities, chest, and back.  Lymphadenopathy:    He has no cervical adenopathy.  Neurological: He is alert.  Skin: Skin is warm and dry. He is not diaphoretic.  Psychiatric: He has a normal mood and affect. His behavior is normal.  Nursing note and vitals reviewed.    ED Treatments / Results  Labs (all labs ordered are listed, but only abnormal results are displayed) Labs Reviewed  COMPREHENSIVE METABOLIC PANEL - Abnormal; Notable for the following components:      Result Value   Glucose, Bld 100 (*)    Total Bilirubin 2.8 (*)    All other components within normal limits  CBC WITH DIFFERENTIAL/PLATELET - Abnormal; Notable for the following components:   WBC 11.3 (*)    RBC 2.83 (*)    Hemoglobin 9.2 (*)    HCT 27.1 (*)    RDW 25.2 (*)    Platelets 475 (*)    nRBC 6.5 (*)    Lymphs Abs 4.2 (*)    nRBC 7 (*)    All other components within normal limits  RETICULOCYTES - Abnormal; Notable for the following components:   Retic Ct Pct 21.6 (*)    RBC. 2.83 (*)    Retic Count, Absolute 611.6 (*)    Immature Retic Fract 39.5 (*)    All other components within normal limits  INFLUENZA  PANEL BY PCR (TYPE A & B)  TROPONIN I   Hemoglobin  Date Value Ref Range Status  10/01/2018 9.2 (L) 13.0 - 17.0 g/dL Final  40/98/1191 7.2 (L) 13.0 - 17.7 g/dL Final  47/82/9562 9.3 (L) 13.0 - 17.7 g/dL Final  13/06/6577 46.9 (L) 13.0 - 17.7 g/dL Final  62/95/2841 8.1 (L) 13.0 - 17.0 g/dL Final  32/44/0102 9.9 (L) 13.0 - 17.0 g/dL Final  72/53/6644 9.0 (L) 13.0 - 17.0 g/dL Final    EKG EKG Interpretation  Date/Time:  Tuesday October 01 2018 08:40:54 EST Ventricular Rate:  72 PR Interval:    QRS Duration: 91 QT Interval:  372 QTC Calculation: 408 R Axis:   73 Text  Interpretation:  Sinus rhythm Consider left atrial enlargement RSR' in V1 or V2, right VCD or RVH Confirmed by Vanetta Mulders [phone]) on 10/01/2018 9:18:10 AM   Radiology Dg Chest 2 View  Result Date: 10/01/2018 CLINICAL DATA:  Cough. EXAM: CHEST - 2 VIEW COMPARISON:  Radiographs of February 15, 2018. FINDINGS: Stable cardiomediastinal silhouette. No pneumothorax or pleural effusion is noted. Mild bibasilar scarring is noted. No acute abnormality is noted. Stable sclerosis of visualized skeleton secondary to sickle cell disease. IMPRESSION: No active cardiopulmonary disease. Electronically Signed   By: Lupita Raider, M.D.   On: 10/01/2018 08:57    Procedures Procedures (including critical care time)  Medications Ordered in ED Medications  HYDROmorphone (DILAUDID) injection 1 mg (1 mg Intravenous Not Given 10/01/18 1153)    Or  HYDROmorphone (DILAUDID) injection 1 mg ( Subcutaneous See Alternative 10/01/18 1153)  ondansetron (ZOFRAN) injection 4 mg (4 mg Intravenous Given 10/01/18 0924)  sodium chloride 0.45 % bolus 1,000 mL (0 mLs Intravenous Stopped 10/01/18 0832)  ketorolac (TORADOL) 30 MG/ML injection 30 mg (30 mg Intravenous Given 10/01/18 0833)  HYDROmorphone (DILAUDID) injection 1 mg (1 mg Intravenous Given 10/01/18 0927)    Or  HYDROmorphone (DILAUDID) injection 0.5 mg ( Subcutaneous See Alternative 10/01/18  0927)  HYDROmorphone (DILAUDID) injection 1 mg (1 mg Intravenous Given 10/01/18 1011)    Or  HYDROmorphone (DILAUDID) injection 1 mg ( Subcutaneous See Alternative 10/01/18 1011)  diphenhydrAMINE (BENADRYL) injection 25 mg (25 mg Intravenous Given 10/01/18 0925)  HYDROmorphone (DILAUDID) injection 2 mg (2 mg Intravenous Given 10/01/18 1213)    Or  HYDROmorphone (DILAUDID) injection 1 mg ( Subcutaneous See Alternative 10/01/18 1213)  methylPREDNISolone sodium succinate (SOLU-MEDROL) 125 mg/2 mL injection 125 mg (125 mg Intravenous Given 10/01/18 1207)  metoCLOPramide (REGLAN) injection 10 mg (10 mg Intravenous Given 10/01/18 1210)     Initial Impression / Assessment and Plan / ED Course  I have reviewed the triage vital signs and the nursing notes.  Pertinent labs & imaging results that were available during my care of the patient were reviewed by me and considered in my medical decision making (see chart for details).  Clinical Course as of Oct 02 1647  Tue Oct 01, 2018  1156 Patient states his pain is now 6/10. We spoke about admission, but patient is resistant to this.   [SJ]  1315 States he feels much better and is ready for discharge.   [SJ]    Clinical Course User Index [SJ] Yaroslav Gombos C, PA-C    Patient presents with generalized pain that he associates with sickle cell crisis pain. Patient is nontoxic appearing, afebrile, not tachycardic, not tachypneic, not hypotensive, maintains excellent SPO2 on room air, and is in no apparent distress.  He does have an increase in his reticulocyte count over previous values, but fortunately his hemoglobin is consistent with previous. I recommended admission for this patient.  He was adamant against admission.  Shared decision-making was used. The patient was given instructions for home care as well as strict return precautions. Patient voices understanding of these instructions, accepts the plan, and is comfortable with discharge.  Findings and  plan of care discussed with Vanetta Mulders, MD.   Vitals:   10/01/18 6045 10/01/18 0730 10/01/18 0745  BP: 111/79 135/79 133/80  Pulse: 87 78 79  Resp: 17    Temp: 98.5 F (36.9 C)    SpO2: 96% 95% 96%  Weight: 86.2 kg    Height: 5\' 11"  (1.803 m)  Vitals:   10/01/18 1100 10/01/18 1130 10/01/18 1215 10/01/18 1336  BP: 125/80 115/84 (!) 150/95 (!) 151/83  Pulse: (!) 56 72 71 93  Resp: 18 15 14 16   Temp:      SpO2: (!) 88% 95% 97% 97%  Weight:      Height:         Final Clinical Impressions(s) / ED Diagnoses   Final diagnoses:  Sickle cell anemia with pain [facility])    ED Discharge Orders         Ordered    predniSONE (STERAPRED UNI-PAK 21 TAB) 10 MG (21) TBPK tablet     10/01/18 1331    benzonatate (TESSALON) 100 MG capsule  Every 8 hours     10/01/18 1331    Oxycodone HCl 10 MG TABS  Every 6 hours PRN    Note to Pharmacy:  This is a hold-over prescription for acute sickle cell crisis until patient can see his PCP.   10/01/18 1331           Anselm Pancoast, PA-C 10/01/18 1653    Vanetta Mulders, MD 10/04/18 1259

## 2018-10-01 NOTE — ED Triage Notes (Signed)
Patient c/o hurting all over yesterday at 5pm. Hx of Sickle Cell disease.

## 2018-10-01 NOTE — ED Notes (Signed)
Patient transported to X-ray 

## 2018-10-01 NOTE — ED Notes (Signed)
Given [place] sandwich and sprite.

## 2018-10-11 ENCOUNTER — Encounter (HOSPITAL_COMMUNITY): Payer: Self-pay

## 2018-10-11 ENCOUNTER — Emergency Department (HOSPITAL_COMMUNITY): Payer: Self-pay

## 2018-10-11 ENCOUNTER — Emergency Department (HOSPITAL_COMMUNITY)
Admission: EM | Admit: 2018-10-11 | Discharge: 2018-10-11 | Disposition: A | Discharge status: Home / Self Care | Payer: Self-pay | Attending: Emergency Medicine | Admitting: Emergency Medicine

## 2018-10-11 ENCOUNTER — Other Ambulatory Visit: Payer: Self-pay

## 2018-10-11 ENCOUNTER — Telehealth: Payer: Self-pay

## 2018-10-11 DIAGNOSIS — R000 Tachycardia, unspecified: Secondary | ICD-10-CM | POA: Insufficient documentation

## 2018-10-11 DIAGNOSIS — Z79899 Other long term (current) drug therapy: Secondary | ICD-10-CM | POA: Insufficient documentation

## 2018-10-11 DIAGNOSIS — D5700 Hb-SS disease with crisis, unspecified: Secondary | ICD-10-CM | POA: Insufficient documentation

## 2018-10-11 LAB — CBC WITH DIFFERENTIAL/PLATELET
ABS IMMATURE GRANULOCYTES: 0.97 10*3/uL — AB (ref 0.00–0.07)
BASOS PCT: 0 %
Basophils Absolute: 0.1 10*3/uL (ref 0.0–0.1)
Eosinophils Absolute: 0 10*3/uL (ref 0.0–0.5)
Eosinophils Relative: 0 %
HEMATOCRIT: 27.2 % — AB (ref 39.0–52.0)
Hemoglobin: 9.2 g/dL — ABNORMAL LOW (ref 13.0–17.0)
Immature Granulocytes: 7 %
LYMPHS ABS: 4.7 10*3/uL — AB (ref 0.7–4.0)
LYMPHS PCT: 34 %
MCH: 31.2 pg (ref 26.0–34.0)
MCHC: 34 g/dL (ref 30.0–36.0)
MCV: 91.6 fL (ref 80.0–100.0)
MONO ABS: 0.8 10*3/uL (ref 0.1–1.0)
MONOS PCT: 6 %
NEUTROS ABS: 7.2 10*3/uL (ref 1.7–7.7)
Neutrophils Relative %: 53 %
PLATELETS: 478 10*3/uL — AB (ref 150–400)
RBC: 2.96 MIL/uL — ABNORMAL LOW (ref 4.22–5.81)
RDW: 28.2 % — ABNORMAL HIGH (ref 11.5–15.5)
WBC: 13.4 10*3/uL — ABNORMAL HIGH (ref 4.0–10.5)
nRBC: 12 % — ABNORMAL HIGH (ref 0.0–0.2)

## 2018-10-11 LAB — COMPREHENSIVE METABOLIC PANEL
ALBUMIN: 4.9 g/dL (ref 3.5–5.0)
ALT: 30 U/L (ref 0–44)
AST: 50 U/L — AB (ref 15–41)
Alkaline Phosphatase: 83 U/L (ref 38–126)
Anion gap: 11 (ref 5–15)
BILIRUBIN TOTAL: 2.4 mg/dL — AB (ref 0.3–1.2)
BUN: 10 mg/dL (ref 6–20)
CO2: 25 mmol/L (ref 22–32)
Calcium: 9.4 mg/dL (ref 8.9–10.3)
Chloride: 102 mmol/L (ref 98–111)
Creatinine, Ser: 0.83 mg/dL (ref 0.61–1.24)
GFR calc Af Amer: >60 mL/min (ref >60)
GFR calc non Af Amer: >60 mL/min (ref >60)
GLUCOSE: 108 mg/dL — AB (ref 70–99)
POTASSIUM: 3.8 mmol/L (ref 3.5–5.1)
Sodium: 138 mmol/L (ref 135–145)
Total Protein: 8.7 g/dL — ABNORMAL HIGH (ref 6.5–8.1)

## 2018-10-11 LAB — RETICULOCYTES
Immature Retic Fract: 23.5 % — ABNORMAL HIGH (ref 2.3–15.9)
RBC.: 2.96 MIL/uL — ABNORMAL LOW (ref 4.22–5.81)
Retic Count, Absolute: 349 10*3/uL — ABNORMAL HIGH (ref 19.0–186.0)
Retic Ct Pct: 11.8 % — ABNORMAL HIGH (ref 0.4–3.1)

## 2018-10-11 MED ORDER — SODIUM CHLORIDE (PF) 0.9 % IJ SOLN
INTRAMUSCULAR | Status: AC
  Filled 2018-10-11: qty 50

## 2018-10-11 MED ORDER — HYDROMORPHONE HCL 1 MG/ML IJ SOLN
1.0000 mg | INTRAMUSCULAR | Status: AC
  Administered 2018-10-11: 1 mg via INTRAVENOUS
  Filled 2018-10-11: qty 1

## 2018-10-11 MED ORDER — HYDROMORPHONE HCL 1 MG/ML IJ SOLN: 1.0000 mg | INTRAMUSCULAR | Status: AC

## 2018-10-11 MED ORDER — ONDANSETRON HCL 4 MG/2ML IJ SOLN
4.0000 mg | INTRAMUSCULAR | Status: DC | PRN
  Administered 2018-10-11: 4 mg via INTRAVENOUS
  Filled 2018-10-11: qty 2

## 2018-10-11 MED ORDER — SODIUM CHLORIDE 0.45 % IV SOLN
INTRAVENOUS | Status: DC
  Administered 2018-10-11: 10:00:00 via INTRAVENOUS

## 2018-10-11 MED ORDER — OXYCODONE-ACETAMINOPHEN 5-325 MG PO TABS
2.0000 | ORAL_TABLET | Freq: Once | ORAL | Status: AC
  Administered 2018-10-11: 2 via ORAL
  Filled 2018-10-11: qty 2

## 2018-10-11 MED ORDER — IOPAMIDOL (ISOVUE-370) INJECTION 76%
INTRAVENOUS | Status: AC
  Filled 2018-10-11: qty 100

## 2018-10-11 MED ORDER — HYDROMORPHONE HCL 1 MG/ML IJ SOLN: 0.5000 mg | INTRAMUSCULAR | Status: AC

## 2018-10-11 MED ORDER — HYDROMORPHONE HCL 1 MG/ML IJ SOLN
0.5000 mg | INTRAMUSCULAR | Status: AC
  Administered 2018-10-11: 0.5 mg via INTRAVENOUS
  Filled 2018-10-11: qty 1

## 2018-10-11 MED ORDER — KETOROLAC TROMETHAMINE 30 MG/ML IJ SOLN
30.0000 mg | INTRAMUSCULAR | Status: AC
  Administered 2018-10-11: 30 mg via INTRAVENOUS
  Filled 2018-10-11: qty 1

## 2018-10-11 MED ORDER — IOPAMIDOL (ISOVUE-370) INJECTION 76%
100.0000 mL | Freq: Once | INTRAVENOUS | Status: AC | PRN
  Administered 2018-10-11: 100 mL via INTRAVENOUS

## 2018-10-11 NOTE — ED Provider Notes (Addendum)
[facility]-EMERGENCY DEPT Provider Note   CSN: [id number] Arrival date & time: 10/11/18  [id number]     History   Chief Complaint Chief Complaint  Patient presents with  . Sickle Cell Pain Crisis    HPI Jerry [name] is a 32 y.o. male with past medical history of sickle cell anemia, presenting to the emergency department with complaint of gradual onset of worsening body aches and chest pain that initially began 2 weeks ago.  Patient states he was evaluated in the emergency department on the 10/01/2018 with some improvement and went home.  Per chart review, he was treated for cough with Tessalon at discharge.  He states yesterday symptoms significantly worsened.  His typical symptoms include chest, rib and back pain.  Today his chest pain is worse and does not feel typical of his sickle cell pain.  He feels like his whole body hurts.  He has continued coughing since previous visit which has worsened.  He feels short of breath with some wheezing.  Endorses subjective fever.  Been taking the cough medicine as prescribed without much relief.  States he missed his sickle cell appointment on the 9th and therefore does not have his home oxycodone either.  Denies history of blood clot or acute chest syndrome.  The history is provided by the patient and medical records.    Past Medical History:  Diagnosis Date  . Sickle cell anemia [facility](HCC)     Patient Active Problem List   Diagnosis Date Noted  . Sickle cell pain crisis (HCC) 11/11/2017  . Vitamin D deficiency 04/26/2017  . Problems related to release from prison 01/09/2017  . 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 SICKLE CELL ANEMIA 02/15/2007  . TOBACCO ABUSE 02/15/2007  . MARIJUANA ABUSE 02/15/2007  . 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 No pertinent surgical history.      Home Medications    Prior to Admission medications   Medication Sig Start Date End Date Taking? Authorizing  Provider  benzonatate (TESSALON) 100 MG capsule Take 1 capsule (100 mg total) by mouth every 8 (eight) hours. Patient taking differently: Take 100 mg by mouth every 8 (eight) hours as needed for cough.  10/01/18  Yes Joy, Shawn C, PA-C  folic acid (FOLVITE) 1 MG tablet Take 1 tablet (1 mg total) by mouth daily. 07/10/18  Yes Mike Gipouglas, Andre, FNP  hydroxyurea (HYDREA) 500 MG capsule Take 3 capsules (1,500 mg total) by mouth daily. May take with food to minimize GI side effects. 07/10/18  Yes Mike Gipouglas, Andre, FNP  ibuprofen (ADVIL,MOTRIN) 600 MG tablet Take 1 tablet (600 mg total) by mouth every 8 (eight) hours as needed. Patient taking differently: Take 600 mg by mouth every 8 (eight) hours as needed for moderate pain.  07/10/18  Yes Mike Gipouglas, Andre, FNP  Oxycodone HCl 10 MG TABS Take 1 tablet (10 mg total) by mouth every 6 (six) hours as needed. 10/01/18  Yes Joy, Shawn C, PA-C  predniSONE (STERAPRED UNI-PAK 21 TAB) 10 MG (21) TBPK tablet Take 6 tabs (60mg ) day 1, 5 tabs (50mg ) day 2, 4 tabs (40mg ) day 3, 3 tabs (30mg ) day 4, 2 tabs (20mg ) day 5, and 1 tab (10mg ) day 6. 10/01/18  Yes Joy, Shawn C, PA-C  Oxycodone HCl 10 MG TABS Take 1 tablet (10 mg total) by mouth every 6 (six) hours as needed. Patient not taking: Reported on 10/11/2018 08/15/18   Mike Gipouglas, Andre, FNP    Family History History  reviewed. No pertinent family history.  Social History Social History   Tobacco Use  . Smoking status: Never Smoker  . Smokeless tobacco: Never Used  Substance Use Topics  . Alcohol use: No  . Drug use: Yes    Types: Marijuana    Comment: occasionally     Allergies   Patient has no known allergies.   Review of Systems Review of Systems  Constitutional: Positive for fever.  HENT: Positive for congestion.   Respiratory: Positive for cough, shortness of breath and wheezing.   Cardiovascular: Positive for chest pain.  Gastrointestinal: Positive for nausea. Negative for abdominal pain and vomiting.    Neurological: Positive for headaches.  All other systems reviewed and are negative.    Physical Exam Updated Vital Signs BP (!) 144/82 (BP Location: Right Arm)   Pulse 99   Temp 97.9 F (36.6 C) (Oral)   Resp 18   Ht 5\' 11"  (1.803 m)   Wt 86.2 kg   SpO2 99%   BMI 26.50 kg/m   Physical Exam  Constitutional: He appears well-developed and well-nourished.  Patient appears very uncomfortable, tearful.  Moving slowly, requiring assistance.  HENT:  Head: Normocephalic and atraumatic.  Eyes: Pupils are equal, round, and reactive to light. Conjunctivae and EOM are normal.  Neck: Normal range of motion. Neck supple.  Cardiovascular: Regular rhythm.  Tachycardic  Pulmonary/Chest: Effort normal. He has wheezes.  Diffuse wheezes bilaterally  Abdominal: Soft. Bowel sounds are normal. He exhibits no distension. There is no tenderness. There is no guarding.  Musculoskeletal:  No lower extremity swelling or tenderness  Neurological: He is alert.  Skin: Skin is warm.  Psychiatric: He has a normal mood and affect. His behavior is normal.  Nursing note and vitals reviewed.    ED Treatments / Results  Labs (all labs ordered are listed, but only abnormal results are displayed) Labs Reviewed  RETICULOCYTES - Abnormal; Notable for the following components:      Result Value   Retic Ct Pct 11.8 (*)    RBC. 2.96 (*)    Retic Count, Absolute 349.0 (*)    Immature Retic Fract 23.5 (*)    All other components within normal limits  COMPREHENSIVE METABOLIC PANEL - Abnormal; Notable for the following components:   Glucose, Bld 108 (*)    Total Protein 8.7 (*)    AST 50 (*)    Total Bilirubin 2.4 (*)    All other components within normal limits  CBC WITH DIFFERENTIAL/PLATELET - Abnormal; Notable for the following components:   WBC 13.4 (*)    RBC 2.96 (*)    Hemoglobin 9.2 (*)    HCT 27.2 (*)    RDW 28.2 (*)    Platelets 478 (*)    nRBC 12.0 (*)    Lymphs Abs 4.7 (*)    Abs Immature  Granulocytes 0.97 (*)    All other components within normal limits    EKG EKG Interpretation  Date/Time:  Friday October 11 2018 09:06:54 EST Ventricular Rate:  91 PR Interval:    QRS Duration: 92 QT Interval:  363 QTC Calculation: 447 R Axis:   68 Text Interpretation:  Sinus rhythm No significant change since last tracing Confirmed by Cathren Laine ([id number]) on 10/11/2018 4:20:47 PM   Radiology Dg Chest 2 View  Result Date: 10/11/2018 CLINICAL DATA:  Pt c/o generalized body pain from Sickle Cell Crisis x 10 days. Rx expired, no meds at home. Patient is a current smoker, chest pain, no  other chest complaints EXAM: CHEST - 2 VIEW COMPARISON:  10/01/2018 FINDINGS: Stable linear opacities in the right mid lung and both lung bases. No new infiltrate or overt edema. Heart size and mediastinal contours are within normal limits. No effusion. Osseous changes consistent with sickle cell disease, without acute finding. IMPRESSION: No acute cardiopulmonary disease. Electronically Signed   By: Corlis Leak M.D.   On: 10/11/2018 11:10   Ct Angio Chest Pe W/cm &/or Wo Cm  Result Date: 10/11/2018 CLINICAL DATA:  Contrast-110ml isovue 370 Pt c/o generalized body pain from Sickle Cell Crisis x 10 days. Rx expired, no meds at home. Chest pain and sob today^129mL ISOVUE-370 IOPAMIDOL (ISOVUE-370) INJECTION 76% EXAM: CT ANGIOGRAPHY CHEST WITH CONTRAST TECHNIQUE: Multidetector CT imaging of the chest was performed using the standard protocol during bolus administration of intravenous contrast. Multiplanar CT image reconstructions and MIPs were obtained to evaluate the vascular anatomy. CONTRAST:  ISOVUE-370 IOPAMIDOL (ISOVUE-370) INJECTION 76% COMPARISON:  10/11/2018, 02/15/2018 FINDINGS: Cardiovascular: Heart size is normal. No pericardial effusion. The pulmonary arteries are moderately well opacified. No acute pulmonary embolus. Due to contrast bolus timing, small peripheral emboli may not be detectable.  Mediastinum/Nodes: The visualized portion of the thyroid gland has a normal appearance. Esophagus is normal in appearance. No mediastinal, hilar, or axillary adenopathy. Lungs/Pleura: There is atelectasis at the RIGHT lung base, associated with minimal volume loss. Focal atelectasis identified in the LATERAL LEFT lung base. No suspicious pulmonary nodules. No focal consolidations or pleural effusions. No pulmonary edema. Upper Abdomen: Small spleen, consistent with known sickle cell disease. Gallbladder is present. Musculoskeletal: Diffusely sclerotic bones. Numerous superior and inferior endplate fractures of the thoracic spine consistent with sickle cell disease. Review of the MIP images confirms the above findings. IMPRESSION: 1. No evidence for acute pulmonary embolus. Smaller emboli may not be detectable due to contrast bolus timing but no large emboli are identified. 2. Bibasilar atelectasis, RIGHT greater than LEFT. No focal consolidations or edema. 3. Osseous changes of sickle cell disease. 4. Small spleen.  Gallbladder is present. Electronically Signed   By: Norva Pavlov M.D.   On: 10/11/2018 12:31    Procedures Procedures (including critical care time)  Medications Ordered in ED Medications  0.45 % sodium chloride infusion ( Intravenous Stopped 10/11/18 1409)  HYDROmorphone (DILAUDID) injection 1 mg (1 mg Intravenous Given 10/11/18 1317)    Or  HYDROmorphone (DILAUDID) injection 1 mg ( Subcutaneous See Alternative 10/11/18 1317)  ondansetron (ZOFRAN) injection 4 mg (4 mg Intravenous Given 10/11/18 0952)  iopamidol (ISOVUE-370) 76 % injection (has no administration in time range)  sodium chloride (PF) 0.9 % injection (has no administration in time range)  HYDROmorphone (DILAUDID) injection 0.5 mg (0.5 mg Intravenous Given 10/11/18 1043)    Or  HYDROmorphone (DILAUDID) injection 0.5 mg ( Subcutaneous See Alternative 10/11/18 1043)  HYDROmorphone (DILAUDID) injection 1 mg (1 mg  Intravenous Given 10/11/18 0952)    Or  HYDROmorphone (DILAUDID) injection 1 mg ( Subcutaneous See Alternative 10/11/18 0952)  HYDROmorphone (DILAUDID) injection 1 mg (1 mg Intravenous Given 10/11/18 1136)    Or  HYDROmorphone (DILAUDID) injection 1 mg ( Subcutaneous See Alternative 10/11/18 1136)  ketorolac (TORADOL) 30 MG/ML injection 30 mg (30 mg Intravenous Given 10/11/18 0952)  iopamidol (ISOVUE-370) 76 % injection 100 mL (100 mLs Intravenous Contrast Given 10/11/18 1208)  oxyCODONE-acetaminophen (PERCOCET/ROXICET) 5-325 MG per tablet 2 tablet (2 tablets Oral Given 10/11/18 1405)     Initial Impression / Assessment and Plan / ED Course  I have reviewed the triage vital signs and the nursing notes.  Pertinent labs & imaging results that were available during my care of the patient were reviewed by me and considered in my medical decision making (see chart for details).  Clinical Course as of Oct 11 1609  Fri Oct 11, 2018  1325 Discussed results with patient.  He reports improvement in symptoms.  Requesting 1 more dose of pain medication prior to arrival.  Will provide p.o. home dose.   [JR]    Clinical Course User Index [JR] Robinson, [place] N, PA-C   Patient presenting with sickle cell crisis. Patient with pain that is normally in his chest, however it is persistent and worsening with SOB x 2weeks. Pt without abdominal complaints. Labs obtained. Sickle cell pain protocol initiated. CXR and EKG ordered with concern for acute chest syndrome.   EKG in sinus rhythm. Labs with increasing WBC since previous. Hbg improved. retic improved. Normal kidney function. Pt re-evaluated, sx improving. CXR is neg for infiltrate or signs of acute chest syndrome.  Patient discussed with Dr. Juleen China.  Will get CTA of the chest to rule out PE.  CTA is negative for acute pathology.  Patient reevaluated after pain medication and reports significant improvement in symptoms.  He looks much more well.  Vital  signs are stable.  There is no evidence of acute chest syndrome, PE, or organ failure.  Pt is afebrile, hemodynamically stable. Pt with improvement in symptoms and states he feels well for discharge.  Recommend he call the sickle cell clinic upon discharge for refill of his chronic pain medication.  Patient is agreeable to plan.  Pt tolerating PO. Pt is safe for discharge with symptomatic management. Strict return precautions discussed.  Patient discussed with and seen by Dr. Juleen China.  Discussed results, findings, treatment and follow up. Patient advised of return precautions. Patient verbalized understanding and agreed with plan.  Final Clinical Impressions(s) / ED Diagnoses   Final diagnoses:  Sickle cell pain crisis [facility])    ED Discharge Orders    None       Robinson, [place] N, PA-C 10/11/18 1621    Robinson, [place] N, PA-C 10/11/18 1621    Raeford Razor, MD 10/13/18 1535

## 2018-10-11 NOTE — ED Triage Notes (Signed)
Pt c/o generalized body pain from Sickle Cell Crisis x 10 days. Rx expired, no meds at home.

## 2018-10-11 NOTE — Discharge Instructions (Signed)
Please follow up with your sickle cell provider regarding your visit today. °Take your pain medications as prescribed, as needed. °Return to the ER for chest pain, shortness of breath, fever, or new or concerning symptoms. ° °

## 2018-10-12 MED ORDER — OXYCODONE HCL 10 MG PO TABS: 10.0000 mg | ORAL_TABLET | Freq: Four times a day (QID) | ORAL | 0 refills | Status: DC | PRN

## 2018-10-12 NOTE — Telephone Encounter (Signed)
Sent medication to [facility] [address]. This is a partial script until patient is seen on 10/17/2018. He did not follow up after his last appt.

## 2018-10-17 ENCOUNTER — Ambulatory Visit (HOSPITAL_COMMUNITY)
Admission: RE | Admit: 2018-10-17 | Discharge: 2018-10-17 | Disposition: A | Discharge status: Home / Self Care | Payer: Self-pay | Source: Ambulatory Visit | Attending: Family Medicine | Admitting: Family Medicine

## 2018-10-17 ENCOUNTER — Encounter: Payer: Self-pay | Admitting: Family Medicine

## 2018-10-17 ENCOUNTER — Ambulatory Visit: Payer: Self-pay | Admitting: Family Medicine

## 2018-10-17 ENCOUNTER — Ambulatory Visit (INDEPENDENT_AMBULATORY_CARE_PROVIDER_SITE_OTHER): Payer: Self-pay | Admitting: Family Medicine

## 2018-10-17 VITALS — BP 87/66 | HR 102 | Temp 98.0°F | Resp 14 | Ht 71.0 in | Wt 165.0 lb

## 2018-10-17 DIAGNOSIS — R634 Abnormal weight loss: Secondary | ICD-10-CM

## 2018-10-17 DIAGNOSIS — R05 Cough: Secondary | ICD-10-CM | POA: Insufficient documentation

## 2018-10-17 DIAGNOSIS — D571 Sickle-cell disease without crisis: Secondary | ICD-10-CM

## 2018-10-17 DIAGNOSIS — R059 Cough, unspecified: Secondary | ICD-10-CM

## 2018-10-17 DIAGNOSIS — R918 Other nonspecific abnormal finding of lung field: Secondary | ICD-10-CM | POA: Insufficient documentation

## 2018-10-17 LAB — POCT URINALYSIS DIPSTICK
Bilirubin, UA: NEGATIVE
Glucose, UA: NEGATIVE
Ketones, UA: NEGATIVE
Leukocytes, UA: NEGATIVE
Nitrite, UA: NEGATIVE
Protein, UA: POSITIVE — AB
Spec Grav, UA: 1.02 (ref 1.010–1.025)
Urobilinogen, UA: 0.2 E.U./dL
pH, UA: 6 (ref 5.0–8.0)

## 2018-10-17 MED ORDER — IBUPROFEN 800 MG PO TABS: 800.0000 mg | ORAL_TABLET | Freq: Three times a day (TID) | ORAL | 2 refills | Status: DC | PRN

## 2018-10-17 MED ORDER — OXYCODONE HCL 10 MG PO TABS: 10.0000 mg | ORAL_TABLET | Freq: Four times a day (QID) | ORAL | 0 refills | Status: DC | PRN

## 2018-10-17 NOTE — Progress Notes (Signed)
PATIENT [facility]ARE C[facility] INTERNAL MEDICINE AND SICKLE CELL CARE  SICKLE CELL ANEMIA FOLLOW UP VISIT PROVIDER: Mike GipAndre Khallid Pasillas, FNP    Subjective:   Jerry [name]  is a 32 y.o.  male who  has a past medical history of Sickle cell anemia (HCC). presents for a follow up for Sickle Cell Anemia. The patient has had 0 admissions in the past 6 months.  Pain regimen includes: Ibuprofen and oxycodone 10mg  Q6H PRN for severe pain.  Hydrea Therapy: No- patient discontinued since the last visit. He would like to conceive.  Medication compliance: Yes  Pain today is 5/10 and is described as residual pain from a crisis.  Patient reports adequate hydration. He states that he is concerned about his recent weight loss. Last weight on 10/11/2018 was 190. Today he is 165. Patient states that he believes there is an error on his weight recordings. He states that he has been 180 for the past few weeks. He reports a weight loss of 15-20 pounds over the past 3 months.    Review of Systems  Constitutional: Positive for weight loss.  HENT: Negative.   Eyes: Negative.   Respiratory: Positive for cough, shortness of breath and wheezing.   Cardiovascular: Negative.   Gastrointestinal: Negative.   Genitourinary: Negative.   Musculoskeletal: Positive for back pain and myalgias.  Skin: Negative.   Neurological: Negative.   Psychiatric/Behavioral: Negative.     Objective:   Objective  BP (!) 87/66 (BP Location: Left Arm, Patient Position: Sitting, Cuff Size: Normal)   Pulse (!) 102   Temp 98 F (36.7 C) (Oral)   Resp 14   Ht 5\' 11"  (1.803 m)   Wt 165 lb (74.8 kg)   SpO2 100%   BMI 23.01 kg/m   Wt Readings from Last 3 Encounters:  10/17/18 165 lb (74.8 kg)  10/11/18 190 lb (86.2 kg)  10/01/18 190 lb (86.2 kg)     Physical Exam  Constitutional: He is oriented to person, place, and time. He appears well-developed and well-nourished. No distress.  HENT:  Head: Normocephalic and atraumatic.    Eyes: Pupils are equal, round, and reactive to light. Conjunctivae and EOM are normal.  Neck: Normal range of motion.  Cardiovascular: Regular rhythm, normal heart sounds and intact distal pulses. Tachycardia present.  Pulmonary/Chest: Effort normal and breath sounds normal. No respiratory distress.  Abdominal: Soft. Bowel sounds are normal. He exhibits no distension.  Musculoskeletal: Normal range of motion. He exhibits tenderness (back).  Neurological: He is alert and oriented to person, place, and time.  Skin: Skin is warm and dry.  Psychiatric: He has a normal mood and affect. His behavior is normal. Thought content normal.  Nursing note and vitals reviewed.    Assessment/Plan:   Assessment   Encounter Diagnoses  Name Primary?  Marland Kitchen. Hb-SS disease without crisis (HCC) Yes  . Weight loss   . Cough      Plan   1. Hb-SS disease without crisis [facility](HCC) Patient with signs of dehydration as exhibited by tachycardia and hypotension. Instructed patient to go to day hospital for IVFs. He is refusing to do so today. He would like to go home and hydrate. I advised him to call tomorrow to come in for BP check and to have 1 liter NS. He agreed to return tomorrow for further evaluation. Discussed keeping scheduled appointments and calling for medication refills. Also discussed that recent discontinuation of hydrea therapy can increase the amount of crises.  - Urinalysis Dipstick -  CBC with Differential - Comprehensive metabolic panel - Oxycodone HCl 10 MG TABS; Take 1 tablet (10 mg total) by mouth every 6 (six) hours as needed for up to 15 days.  Dispense: 60 tablet; Refill: 0 - ibuprofen (ADVIL,MOTRIN) 800 MG tablet; Take 1 tablet (800 mg total) by mouth every 8 (eight) hours as needed.  Dispense: 30 tablet; Refill: 2  2. Weight loss Chest xray and thyroid panel ordered. Encourage increasing portion sizes.  - DG Chest 2 View; Future - Thyroid Panel With TSH  3. Cough Chest xray ordered. If  abnormal, will start antibiotic. No abnormalities heard on exam today.  - DG Chest 2 View; Future  Return to care as scheduled and prn. Patient verbalized understanding and agreed with plan of care.   1. Sickle cell disease - We discussed the need for good hydration, monitoring of hydration status, avoidance of heat, cold, stress, and infection triggers. We discussed the risks and benefits of Hydrea, including bone marrow suppression, the possibility of GI upset, skin ulcers, hair thinning, and teratogenicity. The patient was reminded of the need to seek medical attention of any symptoms of bleeding, anemia, or infection. Continue folic acid 1 mg daily to prevent aplastic bone marrow crises.   2. Pulmonary evaluation - Patient denies severe recurrent wheezes, shortness of breath with exercise, or persistent cough. If these symptoms develop, pulmonary function tests with spirometry will be ordered, and if abnormal, plan on referral to Pulmonology for further evaluation.  3. Cardiac - Routine screening for pulmonary hypertension is not recommended.  4. Eye - High risk of proliferative retinopathy. Annual eye exam with retinal exam recommended to patient.  5. Immunization status -  Yearly influenza vaccination is recommended, as well as being up to date with Meningococcal and Pneumococcal vaccines.   6. Acute and chronic painful episodes - We discussed that pt is to receive Schedule II prescriptions only from [place]. Pt is also aware that the prescription history is available to [place] online through the [facility] CSRS. Controlled substance agreement signed. We reminded Jerry Coke that all patients receiving Schedule II narcotics must be seen for follow within one month of prescription being requested. We reviewed the terms of our pain agreement, including the need to keep medicines in a safe locked location away from children or pets, and the need to report excess sedation or constipation, measures to avoid  constipation, and policies related to early refills and stolen prescriptions. According to the [place] [facility] program, we have reviewed details related to analgesia, adverse effects, aberrant behaviors.  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  Not applicable at this time.  If this occurs will use Exjade for management.   8. Vitamin D deficiency - Drisdol 50,000 units weekly. Patient encouraged to take as prescribed.   The above recommendations are taken from the NIH Evidence-Based Management of Sickle Cell Disease: Expert Panel Report, 16109.   Ms. Andr L. Riley Lam, FNP-BC [facility] [address] [place], [place] [address] [phone]  This note has been created with Dragon speech recognition software and smart phrase technology. Any transcriptional errors are unintentional.

## 2018-10-18 LAB — CBC WITH DIFFERENTIAL/PLATELET
Basophils Absolute: 0 10*3/uL (ref 0.0–0.2)
Basos: 0 %
EOS (ABSOLUTE): 0 10*3/uL (ref 0.0–0.4)
Eos: 0 %
Hematocrit: 22.8 % — ABNORMAL LOW (ref 37.5–51.0)
Hemoglobin: 7.1 g/dL — ABNORMAL LOW (ref 13.0–17.7)
Lymphocytes Absolute: 1.8 10*3/uL (ref 0.7–3.1)
Lymphs: 21 %
MCH: 28.2 pg (ref 26.6–33.0)
MCHC: 31.1 g/dL — ABNORMAL LOW (ref 31.5–35.7)
MCV: 91 fL (ref 79–97)
Monocytes Absolute: 0.7 10*3/uL (ref 0.1–0.9)
Monocytes: 8 %
NRBC: 54 % — ABNORMAL HIGH (ref 0–0)
Neutrophils Absolute: 6.2 10*3/uL (ref 1.4–7.0)
Neutrophils: 71 %
Platelets: 519 10*3/uL — ABNORMAL HIGH (ref 150–450)
RBC: 2.52 x10E6/uL — CL (ref 4.14–5.80)
RDW: 24.2 % — ABNORMAL HIGH (ref 12.3–15.4)
WBC: 8.8 10*3/uL (ref 3.4–10.8)

## 2018-10-18 LAB — COMPREHENSIVE METABOLIC PANEL
ALT: 15 IU/L (ref 0–44)
AST: 17 IU/L (ref 0–40)
Albumin/Globulin Ratio: 1.2 (ref 1.2–2.2)
Albumin: 4.1 g/dL (ref 3.5–5.5)
Alkaline Phosphatase: 106 IU/L (ref 39–117)
BUN/Creatinine Ratio: 12 (ref 9–20)
BUN: 10 mg/dL (ref 6–20)
Bilirubin Total: 0.9 mg/dL (ref 0.0–1.2)
CO2: 22 mmol/L (ref 20–29)
Calcium: 9.5 mg/dL (ref 8.7–10.2)
Chloride: 104 mmol/L (ref 96–106)
Creatinine, Ser: 0.81 mg/dL (ref 0.76–1.27)
GFR calc Af Amer: 138 mL/min/{1.73_m2} (ref >59)
GFR calc non Af Amer: 119 mL/min/{1.73_m2} (ref >59)
Globulin, Total: 3.4 g/dL (ref 1.5–4.5)
Glucose: 74 mg/dL (ref 65–99)
Potassium: 4.2 mmol/L (ref 3.5–5.2)
Sodium: 144 mmol/L (ref 134–144)
Total Protein: 7.5 g/dL (ref 6.0–8.5)

## 2018-10-18 LAB — THYROID PANEL WITH TSH
Free Thyroxine Index: 2.3 (ref 1.2–4.9)
T3 Uptake Ratio: 29 % (ref 24–39)
T4, Total: 8.1 ug/dL (ref 4.5–12.0)
TSH: 1.15 u[IU]/mL (ref 0.450–4.500)

## 2018-11-14 ENCOUNTER — Emergency Department (HOSPITAL_COMMUNITY)
Admission: EM | Admit: 2018-11-14 | Discharge: 2018-11-14 | Disposition: A | Discharge status: Home / Self Care | Payer: Self-pay | Attending: Emergency Medicine | Admitting: Emergency Medicine

## 2018-11-14 ENCOUNTER — Inpatient Hospital Stay (HOSPITAL_COMMUNITY)
Admission: EM | Admit: 2018-11-14 | Discharge: 2018-11-17 | DRG: 812 | Disposition: A | Discharge status: Home / Self Care | Payer: Self-pay | Attending: Internal Medicine | Admitting: Internal Medicine

## 2018-11-14 ENCOUNTER — Other Ambulatory Visit: Payer: Self-pay

## 2018-11-14 ENCOUNTER — Encounter (HOSPITAL_COMMUNITY): Payer: Self-pay | Admitting: Emergency Medicine

## 2018-11-14 ENCOUNTER — Ambulatory Visit: Payer: Self-pay | Admitting: Family Medicine

## 2018-11-14 ENCOUNTER — Encounter (HOSPITAL_COMMUNITY): Payer: Self-pay

## 2018-11-14 ENCOUNTER — Emergency Department (HOSPITAL_COMMUNITY): Payer: Self-pay

## 2018-11-14 DIAGNOSIS — Z79899 Other long term (current) drug therapy: Secondary | ICD-10-CM | POA: Insufficient documentation

## 2018-11-14 DIAGNOSIS — R202 Paresthesia of skin: Secondary | ICD-10-CM

## 2018-11-14 DIAGNOSIS — R200 Anesthesia of skin: Secondary | ICD-10-CM

## 2018-11-14 DIAGNOSIS — D5700 Hb-SS disease with crisis, unspecified: Secondary | ICD-10-CM | POA: Insufficient documentation

## 2018-11-14 DIAGNOSIS — D72829 Elevated white blood cell count, unspecified: Secondary | ICD-10-CM | POA: Insufficient documentation

## 2018-11-14 LAB — CBC WITH DIFFERENTIAL/PLATELET
Abs Immature Granulocytes: 1.2 10*3/uL — ABNORMAL HIGH (ref 0.00–0.07)
BASOS PCT: 0 %
Band Neutrophils: 0 %
Basophils Absolute: 0 10*3/uL (ref 0.0–0.1)
Basophils Absolute: 0 10*3/uL (ref 0.0–0.1)
Basophils Relative: 0 %
Blasts: 0 %
EOS ABS: 0.3 10*3/uL (ref 0.0–0.5)
Eosinophils Absolute: 0 10*3/uL (ref 0.0–0.5)
Eosinophils Relative: 2 %
Eosinophils Relative: 0 %
HCT: 24.8 % — ABNORMAL LOW (ref 39.0–52.0)
HCT: 27 % — ABNORMAL LOW (ref 39.0–52.0)
Hemoglobin: 8.4 g/dL — ABNORMAL LOW (ref 13.0–17.0)
Hemoglobin: 9.3 g/dL — ABNORMAL LOW (ref 13.0–17.0)
LYMPHS PCT: 14 %
Lymphocytes Relative: 28 %
Lymphs Abs: 4.3 10*3/uL — ABNORMAL HIGH (ref 0.7–4.0)
Lymphs Abs: 2.9 10*3/uL (ref 0.7–4.0)
MCH: 31 pg (ref 26.0–34.0)
MCH: 30 pg (ref 26.0–34.0)
MCHC: 34.5 g/dL (ref 30.0–36.0)
MCHC: 34.4 g/dL (ref 30.0–36.0)
MCV: 89.9 fL (ref 80.0–100.0)
MCV: 87.1 fL (ref 80.0–100.0)
MONO ABS: 1.7 10*3/uL — AB (ref 0.1–1.0)
Metamyelocytes Relative: 0 %
Metamyelocytes Relative: 4 %
Monocytes Absolute: 1.4 10*3/uL — ABNORMAL HIGH (ref 0.1–1.0)
Monocytes Relative: 11 %
Monocytes Relative: 7 %
Myelocytes: 0 %
Myelocytes: 2 %
Neutro Abs: 8.9 10*3/uL — ABNORMAL HIGH (ref 1.7–7.7)
Neutro Abs: 15 10*3/uL — ABNORMAL HIGH (ref 1.7–7.7)
Neutrophils Relative %: 59 %
Neutrophils Relative %: 73 %
Other: 0 %
Platelets: 355 10*3/uL (ref 150–400)
Platelets: 355 10*3/uL (ref 150–400)
Promyelocytes Relative: 0 %
RBC: 2.76 MIL/uL — ABNORMAL LOW (ref 4.22–5.81)
RBC: 3.1 MIL/uL — ABNORMAL LOW (ref 4.22–5.81)
RDW: 30.5 % — ABNORMAL HIGH (ref 11.5–15.5)
RDW: 29.6 % — ABNORMAL HIGH (ref 11.5–15.5)
WBC: 15.2 10*3/uL — ABNORMAL HIGH (ref 4.0–10.5)
WBC: 20.6 10*3/uL — ABNORMAL HIGH (ref 4.0–10.5)
nRBC: 0 /100 WBC
nRBC: 11.6 % — ABNORMAL HIGH (ref 0.0–0.2)
nRBC: 20.3 % — ABNORMAL HIGH (ref 0.0–0.2)
nRBC: 29 /100 WBC — ABNORMAL HIGH

## 2018-11-14 LAB — COMPREHENSIVE METABOLIC PANEL
ALK PHOS: 104 U/L (ref 38–126)
ALT: 15 U/L (ref 0–44)
ALT: 18 U/L (ref 0–44)
AST: 25 U/L (ref 15–41)
AST: 48 U/L — ABNORMAL HIGH (ref 15–41)
Albumin: 4 g/dL (ref 3.5–5.0)
Albumin: 4.7 g/dL (ref 3.5–5.0)
Alkaline Phosphatase: 114 U/L (ref 38–126)
Anion gap: 10 (ref 5–15)
Anion gap: 10 (ref 5–15)
BILIRUBIN TOTAL: 2.1 mg/dL — AB (ref 0.3–1.2)
BUN: 9 mg/dL (ref 6–20)
BUN: 9 mg/dL (ref 6–20)
CHLORIDE: 102 mmol/L (ref 98–111)
CO2: 26 mmol/L (ref 22–32)
CO2: 25 mmol/L (ref 22–32)
CREATININE: 0.96 mg/dL (ref 0.61–1.24)
Calcium: 9.2 mg/dL (ref 8.9–10.3)
Calcium: 9.5 mg/dL (ref 8.9–10.3)
Chloride: 105 mmol/L (ref 98–111)
Creatinine, Ser: 0.91 mg/dL (ref 0.61–1.24)
GFR calc Af Amer: >60 mL/min (ref >60)
GFR calc Af Amer: >60 mL/min (ref >60)
GFR calc non Af Amer: >60 mL/min (ref >60)
GFR calc non Af Amer: >60 mL/min (ref >60)
Glucose, Bld: 120 mg/dL — ABNORMAL HIGH (ref 70–99)
Glucose, Bld: 103 mg/dL — ABNORMAL HIGH (ref 70–99)
Potassium: 3.3 mmol/L — ABNORMAL LOW (ref 3.5–5.1)
Potassium: 3.4 mmol/L — ABNORMAL LOW (ref 3.5–5.1)
Sodium: 141 mmol/L (ref 135–145)
Sodium: 137 mmol/L (ref 135–145)
Total Bilirubin: 2.3 mg/dL — ABNORMAL HIGH (ref 0.3–1.2)
Total Protein: 7.3 g/dL (ref 6.5–8.1)
Total Protein: 7.7 g/dL (ref 6.5–8.1)

## 2018-11-14 LAB — RETICULOCYTES
Immature Retic Fract: 40.1 % — ABNORMAL HIGH (ref 2.3–15.9)
Immature Retic Fract: 42.3 % — ABNORMAL HIGH (ref 2.3–15.9)
RBC.: 2.76 MIL/uL — ABNORMAL LOW (ref 4.22–5.81)
RBC.: 3.1 MIL/uL — ABNORMAL LOW (ref 4.22–5.81)
RETIC COUNT ABSOLUTE: 580.2 10*3/uL — AB (ref 19.0–186.0)
RETIC COUNT ABSOLUTE: 538.8 10*3/uL — AB (ref 19.0–186.0)
RETIC CT PCT: 21 % — AB (ref 0.4–3.1)
Retic Ct Pct: 17.8 % — ABNORMAL HIGH (ref 0.4–3.1)

## 2018-11-14 MED ORDER — KETOROLAC TROMETHAMINE 15 MG/ML IJ SOLN
15.0000 mg | Freq: Once | INTRAMUSCULAR | Status: AC
  Administered 2018-11-14: 15 mg via INTRAVENOUS
  Filled 2018-11-14: qty 1

## 2018-11-14 MED ORDER — OXYCODONE HCL 5 MG PO TABS
10.0000 mg | ORAL_TABLET | Freq: Once | ORAL | Status: AC
  Administered 2018-11-14: 5 mg via ORAL
  Filled 2018-11-14: qty 2

## 2018-11-14 MED ORDER — HYDROMORPHONE HCL 1 MG/ML IJ SOLN: 2.0000 mg | INTRAMUSCULAR | Status: AC

## 2018-11-14 MED ORDER — SODIUM CHLORIDE 0.45 % IV SOLN
INTRAVENOUS | Status: AC
  Administered 2018-11-14: 13:00:00 via INTRAVENOUS

## 2018-11-14 MED ORDER — HYDROMORPHONE HCL 1 MG/ML IJ SOLN
2.0000 mg | INTRAMUSCULAR | Status: DC | PRN
  Administered 2018-11-14 (×2): 2 mg via INTRAVENOUS
  Filled 2018-11-14 (×2): qty 2

## 2018-11-14 MED ORDER — SODIUM CHLORIDE 0.45 % IV BOLUS: 2000.0000 mL | Freq: Once | INTRAVENOUS | Status: DC

## 2018-11-14 MED ORDER — FOLIC ACID 1 MG PO TABS
1.0000 mg | ORAL_TABLET | Freq: Every day | ORAL | Status: DC
  Administered 2018-11-14 – 2018-11-17 (×4): 1 mg via ORAL
  Filled 2018-11-14 (×4): qty 1

## 2018-11-14 MED ORDER — SENNOSIDES-DOCUSATE SODIUM 8.6-50 MG PO TABS
1.0000 | ORAL_TABLET | Freq: Two times a day (BID) | ORAL | Status: DC
  Administered 2018-11-15 – 2018-11-17 (×5): 1 via ORAL
  Filled 2018-11-14 (×5): qty 1

## 2018-11-14 MED ORDER — NALOXONE HCL 0.4 MG/ML IJ SOLN: 0.4000 mg | INTRAMUSCULAR | Status: DC | PRN

## 2018-11-14 MED ORDER — DIPHENHYDRAMINE HCL 12.5 MG/5ML PO ELIX: 12.5000 mg | ORAL_SOLUTION | Freq: Four times a day (QID) | ORAL | Status: DC | PRN

## 2018-11-14 MED ORDER — POLYETHYLENE GLYCOL 3350 17 G PO PACK: 17.0000 g | PACK | Freq: Every day | ORAL | Status: DC | PRN

## 2018-11-14 MED ORDER — DIPHENHYDRAMINE HCL 25 MG PO CAPS: 25.0000 mg | ORAL_CAPSULE | ORAL | Status: DC | PRN

## 2018-11-14 MED ORDER — HYDROMORPHONE HCL 1 MG/ML IJ SOLN
2.0000 mg | Freq: Once | INTRAMUSCULAR | Status: AC
  Administered 2018-11-14: 2 mg via INTRAVENOUS
  Filled 2018-11-14: qty 2

## 2018-11-14 MED ORDER — HYDROMORPHONE HCL 1 MG/ML IJ SOLN
2.0000 mg | INTRAMUSCULAR | Status: AC
  Administered 2018-11-14: 2 mg via INTRAVENOUS
  Filled 2018-11-14: qty 2

## 2018-11-14 MED ORDER — IBUPROFEN 800 MG PO TABS
800.0000 mg | ORAL_TABLET | Freq: Once | ORAL | Status: AC
  Administered 2018-11-14: 800 mg via ORAL
  Filled 2018-11-14: qty 1

## 2018-11-14 MED ORDER — ENOXAPARIN SODIUM 40 MG/0.4ML ~~LOC~~ SOLN
40.0000 mg | SUBCUTANEOUS | Status: DC
  Administered 2018-11-15 – 2018-11-16 (×2): 40 mg via SUBCUTANEOUS
  Filled 2018-11-14 (×2): qty 0.4

## 2018-11-14 MED ORDER — HYDROMORPHONE HCL 1 MG/ML IJ SOLN: 1.0000 mg | INTRAMUSCULAR | Status: DC | PRN

## 2018-11-14 MED ORDER — ONDANSETRON HCL 4 MG/2ML IJ SOLN: 4.0000 mg | Freq: Four times a day (QID) | INTRAMUSCULAR | Status: DC | PRN

## 2018-11-14 MED ORDER — SODIUM CHLORIDE 0.45 % IV SOLN
INTRAVENOUS | Status: DC
  Administered 2018-11-14: 01:00:00 via INTRAVENOUS

## 2018-11-14 MED ORDER — HYDROMORPHONE 1 MG/ML IV SOLN
INTRAVENOUS | Status: DC
  Administered 2018-11-14: 30 mg via INTRAVENOUS
  Administered 2018-11-15: 3.3 mg via INTRAVENOUS
  Administered 2018-11-15: 2.4 mg via INTRAVENOUS
  Administered 2018-11-15: 5.5 mg via INTRAVENOUS
  Administered 2018-11-15: 2.4 mg via INTRAVENOUS
  Administered 2018-11-15: 1.2 mg via INTRAVENOUS
  Administered 2018-11-15: 0.6 mg via INTRAVENOUS
  Administered 2018-11-16: 5.1 mg via INTRAVENOUS
  Administered 2018-11-16 (×2): 3 mg via INTRAVENOUS
  Administered 2018-11-16: 1.8 mg via INTRAVENOUS
  Administered 2018-11-16: 30 mg via INTRAVENOUS
  Administered 2018-11-16: 3.9 mg via INTRAVENOUS
  Administered 2018-11-17: 3.6 mg via INTRAVENOUS
  Administered 2018-11-17: 2.7 mg via INTRAVENOUS
  Administered 2018-11-17: 0.9 mg via INTRAVENOUS
  Administered 2018-11-17: 4.8 mg via INTRAVENOUS
  Filled 2018-11-14 (×3): qty 30

## 2018-11-14 MED ORDER — ONDANSETRON 4 MG PO TBDP
4.0000 mg | ORAL_TABLET | Freq: Once | ORAL | Status: AC
  Administered 2018-11-14: 4 mg via ORAL
  Filled 2018-11-14: qty 1

## 2018-11-14 MED ORDER — OXYCODONE HCL 5 MG PO TABS: 10.0000 mg | ORAL_TABLET | Freq: Once | ORAL | Status: DC

## 2018-11-14 MED ORDER — KETOROLAC TROMETHAMINE 15 MG/ML IJ SOLN
15.0000 mg | Freq: Four times a day (QID) | INTRAMUSCULAR | Status: DC
  Administered 2018-11-15 – 2018-11-17 (×11): 15 mg via INTRAVENOUS
  Filled 2018-11-14 (×12): qty 1

## 2018-11-14 MED ORDER — SODIUM CHLORIDE 0.9% FLUSH: 9.0000 mL | INTRAVENOUS | Status: DC | PRN

## 2018-11-14 MED ORDER — HYDROMORPHONE HCL 1 MG/ML IJ SOLN
1.0000 mg | Freq: Once | INTRAMUSCULAR | Status: AC
  Administered 2018-11-14: 1 mg via INTRAVENOUS
  Filled 2018-11-14: qty 1

## 2018-11-14 MED ORDER — PROMETHAZINE HCL 25 MG PO TABS
25.0000 mg | ORAL_TABLET | ORAL | Status: DC | PRN
  Administered 2018-11-14: 25 mg via ORAL
  Filled 2018-11-14: qty 1

## 2018-11-14 MED ORDER — IBUPROFEN 800 MG PO TABS: 800.0000 mg | ORAL_TABLET | Freq: Three times a day (TID) | ORAL | 0 refills | Status: DC

## 2018-11-14 MED ORDER — KETOROLAC TROMETHAMINE 30 MG/ML IJ SOLN
30.0000 mg | INTRAMUSCULAR | Status: AC
  Administered 2018-11-14: 30 mg via INTRAVENOUS
  Filled 2018-11-14: qty 1

## 2018-11-14 MED ORDER — DIPHENHYDRAMINE HCL 50 MG/ML IJ SOLN: 12.5000 mg | Freq: Four times a day (QID) | INTRAMUSCULAR | Status: DC | PRN

## 2018-11-14 NOTE — ED Triage Notes (Signed)
Pt states sickle cell crisis that started yesterday. He was seen here last night for same. States pain primarily in his head.

## 2018-11-14 NOTE — ED Notes (Signed)
Pt does not want the oxycodone so that he can drive home. Pt requesting 800mg  Ibuprofen instead.

## 2018-11-14 NOTE — ED Notes (Signed)
Called [facility] for Transfer

## 2018-11-14 NOTE — H&P (Signed)
H&P  Patient Demographics:  Jerry [name], is a 32 y.o. male  MRN: [id number]   DOB - 09-Apr-1986  Admit Date - 11/14/2018  Outpatient Primary MD for the patient is Quentin Angst, MD  Chief Complaint  Patient presents with  . Sickle Cell Pain Crisis      HPI:   Jerry [name]  is a 32 y.o. male with a medical history significant for sickle cell anemia presents complaining of generalized pain.  Patient was treated and evaluated in the emergency room at [facility] this am and pain improved at that time.  Patient was given oxycodone 10 mg at discharge and a prescription for ibuprofen.  He states that upon arriving home, he started having increased generalized pain that was mostly to arms, torso, hips, and buttocks.  He states that current pain is not consistent with typical sickle cell crisis.    Patient states that pain is mostly to low back and lower extremities.  He also endorses frontal headache.  Patient also states that he is not been taking any medications due to trying to conceive.  Current pain intensity is 8/10 characterized as constant and sharp.  He denies shortness of breath, focal neurological deficits, dysuria, vomiting, or diarrhea.  ER course: WBCs 20.6, hemoglobin 9.3, platelets 355.  Potassium 3.4.  Patient afebrile, maintaining oxygen saturation greater than 90% on RA.  Heart rate 103. Pain persists despite multiple doses of IV Dilaudid, IV Toradol, and IV fluids.  Sickle cell seeing notified for admission.   Review of systems:  I Review of Systems  Constitutional: Negative for chills and fever.  HENT: Negative.   Eyes: Negative.   Respiratory: Negative.  Negative for sputum production.   Cardiovascular: Negative.   Gastrointestinal: Negative.   Genitourinary: Negative.  Negative for dysuria.  Musculoskeletal: Positive for back pain and joint pain.  Skin: Negative.   Neurological: Positive for headaches.  Endo/Heme/Allergies: Negative.    Psychiatric/Behavioral: Negative.  Negative for depression, substance abuse and suicidal ideas.    A full 10 point Review of Systems was done, except as stated above, all other Review of Systems were negative.  With Past History of the following :   Past Medical History:  Diagnosis Date  . Sickle cell anemia (HCC)       History reviewed. No pertinent surgical history.   Social History:   Social History   Tobacco Use  . Smoking status: Never Smoker  . Smokeless tobacco: Never Used  Substance Use Topics  . Alcohol use: No     Lives - At home   Family History :   History reviewed. No pertinent family history.   Home Medications:   Prior to Admission medications   Medication Sig Start Date End Date Taking? Authorizing Provider  folic acid (FOLVITE) 1 MG tablet Take 1 tablet (1 mg total) by mouth daily. Patient not taking: Reported on 10/17/2018 07/10/18   Mike Gip, FNP  ibuprofen (ADVIL,MOTRIN) 800 MG tablet Take 1 tablet (800 mg total) by mouth 3 (three) times daily. 11/14/18   Roxy Horseman, PA-C  oxyCODONE (OXY IR/ROXICODONE) 5 MG immediate release tablet Take 10 mg by mouth every 4 (four) hours as needed for severe pain.    [provider]     Allergies:   No Known Allergies   Physical Exam:   Vitals:   Vitals:   11/14/18 1515 11/14/18 1530  BP: (!) 143/88 (!) 141/89  Pulse: 100 92  Resp: 17 17  Temp:  SpO2: 98% 97%    Physical Exam: Constitutional: Patient appears well-developed and well-nourished. Not in obvious distress. HENT: Normocephalic, atraumatic, External right and left ear normal. Oropharynx is clear and moist.  Eyes: Conjunctivae and EOM are normal. PERRLA, no scleral icterus. Neck: Normal ROM. Neck supple. No JVD. No tracheal deviation. No thyromegaly. CVS: RRR, S1/S2 +, no murmurs, no gallops, no carotid bruit.  Pulmonary: Effort and breath sounds normal, no stridor, rhonchi, wheezes, rales.  Abdominal: Soft. BS +, no  distension, tenderness, rebound or guarding.  Musculoskeletal: Normal range of motion. No edema and no tenderness.  Lymphadenopathy: No lymphadenopathy noted, cervical, inguinal or axillary Neuro: Alert. Normal reflexes, muscle tone coordination. No cranial nerve deficit. Skin: Skin is warm and dry. No rash noted. Not diaphoretic. No erythema. No pallor. Psychiatric: Normal mood and affect. Behavior, judgment, thought content normal.   Data Review:   CBC Recent Labs  Lab 11/14/18 0104 11/14/18 1258  WBC 15.2* 20.6*  HGB 8.4* 9.3*  HCT 24.8* 27.0*  PLT 355 355  MCV 89.9 87.1  MCH 31.0 30.0  MCHC 34.5 34.4  RDW 30.5* 29.6*  LYMPHSABS 4.3* 2.9  MONOABS 1.7* 1.4*  EOSABS 0.3 0.0  BASOSABS 0.0 0.0   ------------------------------------------------------------------------------------------------------------------  Chemistries  Recent Labs  Lab 11/14/18 0104 11/14/18 1258  NA 141 137  K 3.3* 3.4*  CL 105 102  CO2 26 25  GLUCOSE 120* 103*  BUN 9 9  CREATININE 0.96 0.91  CALCIUM 9.2 9.5  AST 25 48*  ALT 15 18  ALKPHOS 104 114  BILITOT 2.1* 2.3*   ------------------------------------------------------------------------------------------------------------------ estimated creatinine clearance is 126.4 mL/min (by C-G formula based on SCr of 0.91 mg/dL). ------------------------------------------------------------------------------------------------------------------ No results for input(s): TSH, T4TOTAL, T3FREE, THYROIDAB in the last 72 hours.  Invalid input(s): FREET3  Coagulation profile No results for input(s): INR, PROTIME in the last 168 hours. ------------------------------------------------------------------------------------------------------------------- No results for input(s): DDIMER in the last 72 hours. -------------------------------------------------------------------------------------------------------------------  Cardiac Enzymes No results for  input(s): CKMB, TROPONINI, MYOGLOBIN in the last 168 hours.  Invalid input(s): CK ------------------------------------------------------------------------------------------------------------------ No results found for: BNP  ---------------------------------------------------------------------------------------------------------------  Urinalysis    Component Value Date/Time   COLORURINE YELLOW 02/15/2018 0629   APPEARANCEUR CLEAR 02/15/2018 0629   LABSPEC 1.010 02/15/2018 0629   PHURINE 5.0 02/15/2018 0629   GLUCOSEU NEGATIVE 02/15/2018 0629   HGBUR SMALL (A) 02/15/2018 0629   BILIRUBINUR neg 10/17/2018 1155   KETONESUR NEGATIVE 02/15/2018 0629   PROTEINUR Positive (A) 10/17/2018 1155   PROTEINUR 30 (A) 02/15/2018 0629   UROBILINOGEN 0.2 10/17/2018 1155   UROBILINOGEN 2.0 (H) 04/26/2017 0942   NITRITE neg 10/17/2018 1155   NITRITE NEGATIVE 02/15/2018 0629   LEUKOCYTESUR Negative 10/17/2018 1155    ----------------------------------------------------------------------------------------------------------------   Imaging Results:    Dg Chest 2 View  Result Date: 11/14/2018 CLINICAL DATA:  Chest pain. Shortness of breath. History of sickle cell. EXAM: CHEST - 2 VIEW COMPARISON:  October 17, 2017 FINDINGS: No pneumothorax. Mild cardiomegaly. The hila and mediastinum are unchanged. Mild opacity in the right base is favored to represent scar atelectasis. Infiltrate considered less likely. Dense bones consistent with sickle cell. IMPRESSION: 1. Persistent opacity in the right base favored to represent scar or atelectasis. Infiltrate considered less likely. 2. Dense bones consistent with sickle cell. 3. No other acute abnormalities. Electronically Signed   By: Gerome Samavid  Marxen III M.D   On: 11/14/2018 14:32     Assessment & Plan:  Active Problems:   Sickle cell pain crisis (HCC)  Sickle cell anemia with pain crisis: Admit to MedSurg unit Initiate 0.45% saline at 75 mL/h Continue  custom dose Dilaudid PCA within 30 minutes of admission IV Toradol 15 mg every 6 hours Tylenol 650 mg every 6 hours for mild to moderate pain Monitor vital signs very closely Reevaluate pain scale in context of functioning Maintain oxygen saturation above 90% Continue folic acid 1 mg daily Hemoglobin 9.3, no medical indication for blood transfusion at this time.  Continue to monitor.  If hemoglobin decreases below 7.0, will transfuse 1 unit of packed red blood cells  Leukocytosis: WBCs 20.6, patient afebrile, suspected to be reactive.  Continue to monitor closely Repeat CBC in a.m.   DVT Prophylaxis: Subcut Lovenox   AM Labs Ordered, also please review Full Orders  Family Communication: Admission, patient's condition and plan of care including tests being ordered have been discussed with the patient who indicate understanding and agree with the plan and Code Status.  Code Status: Full Code  Consults called: None    Admission status: Inpatient    Time spent in minutes : 50 minutes  Nolon NationsLachina Moore Melynda Krzywicki  APRN, MSN, FNP-C [facility] [address]  [place], [place] [id number] [phone]  11/14/2018 at 3:58 PM

## 2018-11-14 NOTE — ED Provider Notes (Signed)
[facility] EMERGENCY [facility] Provider Note   CSN: [id number] Arrival date & time: 11/14/18  0021     History   Chief Complaint Chief Complaint  Patient presents with  . Sickle Cell Pain Crisis    HPI Jerry [name] is a 32 y.o. male.  Patient presents to the emergency department with a chief complaint of sickle cell pain.  He reports that he has not been taking his regular sickle cell pain medications because he was told not to if he was trying to have children.  He states that he believes this is the reason that he has had more frequent flareups in the past month or 2.  He denies any fever, but states that he has had some chills.  States that the pain is his entire body distributions.  Denies any cough.  Denies chest pain or shortness of breath.  Denies any other associated symptoms.  The history is provided by the patient. No language interpreter was used.    Past Medical History:  Diagnosis Date  . Sickle cell anemia [facility](HCC)     Patient Active Problem List   Diagnosis Date Noted  . Sickle cell pain crisis (HCC) 11/11/2017  . Vitamin D deficiency 04/26/2017  . Problems related to release from prison 01/09/2017  . 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 SICKLE CELL ANEMIA 02/15/2007  . TOBACCO ABUSE 02/15/2007  . MARIJUANA ABUSE 02/15/2007  . 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 No pertinent surgical history.      Home Medications    Prior to Admission medications   Medication Sig Start Date End Date Taking? Authorizing Provider  folic acid (FOLVITE) 1 MG tablet Take 1 tablet (1 mg total) by mouth daily. Patient not taking: Reported on 10/17/2018 07/10/18   Mike Gipouglas, Andre, FNP  ibuprofen (ADVIL,MOTRIN) 800 MG tablet Take 1 tablet (800 mg total) by mouth every 8 (eight) hours as needed. 10/17/18   Mike Gipouglas, Andre, FNP    Family History No family history on file.  Social History Social History   Tobacco Use    . Smoking status: Never Smoker  . Smokeless tobacco: Never Used  Substance Use Topics  . Alcohol use: No  . Drug use: Yes    Types: Marijuana    Comment: occasionally     Allergies   Patient has no known allergies.   Review of Systems Review of Systems  All other systems reviewed and are negative.    Physical Exam Updated Vital Signs BP 138/85 (BP Location: Right Arm)   Pulse 92   Temp 98.1 F (36.7 C)   Resp 16   Ht 5\' 11"  (1.803 m)   Wt 79.4 kg   SpO2 95%   BMI 24.41 kg/m   Physical Exam Vitals signs and nursing note reviewed.  Constitutional:      Appearance: He is well-developed.  HENT:     Head: Normocephalic and atraumatic.     Right Ear: External ear normal.     Left Ear: External ear normal.  Eyes:     General: No scleral icterus.       Right eye: No discharge.        Left eye: No discharge.     Conjunctiva/sclera: Conjunctivae normal.     Pupils: Pupils are equal, round, and reactive to light.  Neck:     Musculoskeletal: Normal range of motion and neck supple.     Vascular: No JVD.  Comments: No pain with neck flexion, no meningismus Cardiovascular:     Rate and Rhythm: Normal rate and regular rhythm.     Heart sounds: Normal heart sounds. No murmur. No friction rub. No gallop.   Pulmonary:     Effort: Pulmonary effort is normal. No respiratory distress.     Breath sounds: Normal breath sounds. No wheezing or rales.  Chest:     Chest wall: No tenderness.  Abdominal:     General: There is no distension.     Palpations: Abdomen is soft. There is no mass.     Tenderness: There is no abdominal tenderness. There is no guarding or rebound.  Musculoskeletal: Normal range of motion.        General: No tenderness.  Skin:    General: Skin is warm and dry.  Neurological:     Mental Status: He is alert and oriented to person, place, and time.     Deep Tendon Reflexes: Reflexes are normal and symmetric.     Comments: CN 3-12 intact, normal finger  to nose, no pronator drift, sensation and strength intact bilaterally.  Psychiatric:        Behavior: Behavior normal.        Thought Content: Thought content normal.        Judgment: Judgment normal.      ED Treatments / Results  Labs (all labs ordered are listed, but only abnormal results are displayed) Labs Reviewed  COMPREHENSIVE METABOLIC PANEL  CBC WITH DIFFERENTIAL/PLATELET  RETICULOCYTES    EKG None  Radiology No results found.  Procedures Procedures (including critical care time)  Medications Ordered in ED Medications - No data to display   Initial Impression / Assessment and Plan / ED Course  I have reviewed the triage vital signs and the nursing notes.  Pertinent labs & imaging results that were available during my care of the patient were reviewed by me and considered in my medical decision making (see chart for details).     Patient with sickle cell pain.  Not taking home medicines due to trying to have a child. States he doesn't want the meds in him over concern of birth defects.   Denies fevers or recent illness.  States pain is all over and that he has a headache as well.  Neurovascularly intact.  Patient reassessed, still reports having severe pain.  Will give an additional dose of Dilaudid.  Patient states that he is normally able to avoid admission, but feels that he will need to be admitted tonight.  We will try 1 more dose, and reassess.  3:15 AM Patient reports feeling improved.  I did offer the patient admission, but he declined.  He states he would rather try and go home.  He does ask for a prescription for ibuprofen, which I will give him.  Patient given 10 mg oxycodone at discharge.  He is encouraged to have someone pick him up from the emergency department.  Final Clinical Impressions(s) / ED Diagnoses   Final diagnoses:  Sickle cell pain crisis [facility](HCC)    ED Discharge Orders         Ordered    ibuprofen (ADVIL,MOTRIN) 800 MG tablet  3  times daily     11/14/18 0314           Roxy HorsemanBrowning, Kannan Proia, PA-C 11/14/18 0316    Glynn Octaveancour, Stephen, MD 11/14/18 [phone]

## 2018-11-14 NOTE — Discharge Instructions (Signed)
[facility].

## 2018-11-14 NOTE — ED Notes (Signed)
Patient verbalizes understanding of discharge instructions. Opportunity for questioning and answers were provided. Armband removed by staff, pt discharged from ED home via POV with family. 

## 2018-11-14 NOTE — ED Provider Notes (Signed)
[facility] Provider Note   CSN: [id number] Arrival date & time: 11/14/18  1140  History   Chief Complaint Chief Complaint  Patient presents with  . Sickle Cell Pain Crisis   HPI Jerry [name] is a 32 y.o. male.  This is a 32 year old male with history of sickle cell anemia currently not on any medications due to trying to conceive who presents with a sickle cell crisis.  He reports that it started last night around 10 AM and it is painful in his head as well as all over his body, including his arms, torso, hips and buttocks. He also reports some chest pain and shortness of breath, he reports that the shortness of breath is due to the pain that he is having. He went to the [facility] emergency room earlier this morning and he was treated with Dilaudid, his pain improved at that time and he was given 10 mg oxycodone at discharge and a prescription for ibuprofen. Labs there showed a WBC of 15.2, Hgb 8.4, Plt 355, and RBC 2.76, Abs Retic 580.  He was advised to return if his gums did not improve or got worse.  He went home and is lying in bed he started having significant generalized pain, reports it now as a 15 out of 10.  He denies any chest pain, fever, chills, vomiting.  He reports some shortness of breath due to the pain as well as some nausea.  He last took his oxycodone yesterday.      Past Medical History:  Diagnosis Date  . Sickle cell anemia [facility](HCC)     Patient Active Problem List   Diagnosis Date Noted  . Sickle cell pain crisis (HCC) 11/11/2017  . Vitamin D deficiency 04/26/2017  . Problems related to release from prison 01/09/2017  . 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 SICKLE CELL ANEMIA 02/15/2007  . TOBACCO ABUSE 02/15/2007  . MARIJUANA ABUSE 02/15/2007  . 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 No pertinent surgical history.     Home Medications    Prior to Admission medications     Medication Sig Start Date End Date Taking? Authorizing Provider  folic acid (FOLVITE) 1 MG tablet Take 1 tablet (1 mg total) by mouth daily. Patient not taking: Reported on 10/17/2018 07/10/18   Mike Gipouglas, Andre, FNP  ibuprofen (ADVIL,MOTRIN) 800 MG tablet Take 1 tablet (800 mg total) by mouth 3 (three) times daily. 11/14/18   Roxy HorsemanBrowning, Robert, PA-C  oxyCODONE (OXY IR/ROXICODONE) 5 MG immediate release tablet Take 10 mg by mouth every 4 (four) hours as needed for severe pain.    [provider]    Family History History reviewed. No pertinent family history.  Social History Social History   Tobacco Use  . Smoking status: Never Smoker  . Smokeless tobacco: Never Used  Substance Use Topics  . Alcohol use: No  . Drug use: Yes    Types: Marijuana    Comment: occasionally     Allergies   Patient has no known allergies.   Review of Systems Review of Systems  Constitutional: Negative for appetite change, chills, fatigue and fever.  Respiratory: Positive for shortness of breath. Negative for cough, chest tightness and wheezing.   Cardiovascular: Negative for chest pain and palpitations.  Gastrointestinal: Positive for nausea. Negative for abdominal pain and vomiting.  Musculoskeletal: Positive for myalgias.  Neurological: Positive for headaches. Negative for weakness.  All other systems reviewed and are negative.  Physical Exam Updated Vital Signs BP 136/87   Pulse 92   Temp 98.7 F (37.1 C) (Oral)   Resp 20   SpO2 97%   Physical Exam Vitals signs and nursing note reviewed.  Constitutional:      General: He is in acute distress.     Appearance: He is well-developed.  HENT:     Head: Normocephalic and atraumatic.  Eyes:     Conjunctiva/sclera: Conjunctivae normal.  Neck:     Musculoskeletal: Neck supple.  Cardiovascular:     Rate and Rhythm: Regular rhythm. Tachycardia present.     Heart sounds: No murmur.  Pulmonary:     Effort: Pulmonary effort is  normal. No respiratory distress.     Breath sounds: Normal breath sounds.  Abdominal:     Palpations: Abdomen is soft.     Tenderness: There is no abdominal tenderness.  Musculoskeletal:        General: Tenderness (TTP over upper and lower extremities) present.  Skin:    General: Skin is warm and dry.  Neurological:     Mental Status: He is alert.      ED Treatments / Results  Labs (all labs ordered are listed, but only abnormal results are displayed) Labs Reviewed  COMPREHENSIVE METABOLIC PANEL - Abnormal; Notable for the following components:      Result Value   Potassium 3.4 (*)    Glucose, Bld 103 (*)    AST 48 (*)    Total Bilirubin 2.3 (*)    All other components within normal limits  CBC WITH DIFFERENTIAL/PLATELET - Abnormal; Notable for the following components:   WBC 20.6 (*)    RBC 3.10 (*)    Hemoglobin 9.3 (*)    HCT 27.0 (*)    RDW 29.6 (*)    nRBC 20.3 (*)    Neutro Abs 15.0 (*)    Monocytes Absolute 1.4 (*)    nRBC 29 (*)    Abs Immature Granulocytes 1.20 (*)    All other components within normal limits  RETICULOCYTES - Abnormal; Notable for the following components:   Retic Ct Pct 17.8 (*)    RBC. 3.10 (*)    Retic Count, Absolute 538.8 (*)    Immature Retic Fract 42.3 (*)    All other components within normal limits    EKG None  Radiology Dg Chest 2 View  Result Date: 11/14/2018 CLINICAL DATA:  Chest pain. Shortness of breath. History of sickle cell. EXAM: CHEST - 2 VIEW COMPARISON:  October 17, 2017 FINDINGS: No pneumothorax. Mild cardiomegaly. The hila and mediastinum are unchanged. Mild opacity in the right base is favored to represent scar atelectasis. Infiltrate considered less likely. Dense bones consistent with sickle cell. IMPRESSION: 1. Persistent opacity in the right base favored to represent scar or atelectasis. Infiltrate considered less likely. 2. Dense bones consistent with sickle cell. 3. No other acute abnormalities.  Electronically Signed   By: Gerome Sam III M.D   On: 11/14/2018 14:32    Procedures Procedures (including critical care time)  Medications Ordered in ED Medications  0.45 % sodium chloride infusion ( Intravenous New Bag/Given 11/14/18 1256)  HYDROmorphone (DILAUDID) injection 2 mg (2 mg Intravenous Given 11/14/18 1348)  HYDROmorphone (DILAUDID) injection 2 mg (has no administration in time range)  HYDROmorphone (DILAUDID) injection 1 mg (1 mg Intravenous Given 11/14/18 1254)  ketorolac (TORADOL) 15 MG/ML injection 15 mg (15 mg Intravenous Given 11/14/18 1352)     Initial Impression / Assessment and Plan /  ED Course  I have reviewed the triage vital signs and the nursing notes.  Pertinent labs & imaging results that were available during my care of the patient were reviewed by me and considered in my medical decision making (see chart for details).     This is a 32 year old male with a history of sickle cell anemia who recently stopped taking hydrea who presented with a sickle cell crisis. He had been seen earlier this morning at [facility] and was treated with dilaudid, pain improved and he was discharged home with ibuprofen. He returned today due to worsening pain. He reported some shortness of breath and some chest pain.  [facility] showed a WBC of 15.2, Hgb 8.4, Plt 355, and RBC 2.76, Abs Retic 580. On arrival patient was afebrile, tachycardic, hypertensive, with a normal RR. He has tenderness to palpation over his arms, legs, and chest. No other obvious lesions. Labs here showed a hypokalemia to 3.4, leukocytosis of 20.6, hgb 9.3. Patient was given 1 mg dilaudid and started on 1/2 NS drip, he reports that this helped with his breathing a little bit but he is still having a lot of pain.   Patient was given 2 mg IV dilaudid and some Toradol, he reports that this helped a little bit but that he is still having pain. He is still receiving fluids. CXR showed persistent  opacity consistent with atelectasis, no acute findings. There does not appear to be any acute complications at the moment. Contacted sickle cell inpatient for admission for pain control. Will give 1 more dose of dilaudid prior to admission.   Final Clinical Impressions(s) / ED Diagnoses   Final diagnoses:  Sickle cell pain crisis [facility](HCC)    ED Discharge Orders    None       Claudean SeveranceKrienke, Marissa M, MD 11/14/18 1526    Tilden Fossaees, Elizabeth, MD 11/15/18 [id number]

## 2018-11-14 NOTE — ED Notes (Signed)
Patient transported to X-ray 

## 2018-11-14 NOTE — ED Triage Notes (Signed)
Pt reports a sickle cell crisis that started approximately 2200 last evening. Pt reports having home medications but he does not have any left.

## 2018-11-15 ENCOUNTER — Observation Stay (HOSPITAL_COMMUNITY): Payer: Self-pay

## 2018-11-15 DIAGNOSIS — R202 Paresthesia of skin: Secondary | ICD-10-CM

## 2018-11-15 DIAGNOSIS — D72829 Elevated white blood cell count, unspecified: Secondary | ICD-10-CM

## 2018-11-15 DIAGNOSIS — R200 Anesthesia of skin: Secondary | ICD-10-CM

## 2018-11-15 DIAGNOSIS — D5700 Hb-SS disease with crisis, unspecified: Principal | ICD-10-CM

## 2018-11-15 LAB — BASIC METABOLIC PANEL
Anion gap: 10 (ref 5–15)
BUN: 10 mg/dL (ref 6–20)
CALCIUM: 9.1 mg/dL (ref 8.9–10.3)
CO2: 25 mmol/L (ref 22–32)
CREATININE: 0.74 mg/dL (ref 0.61–1.24)
Chloride: 103 mmol/L (ref 98–111)
GFR calc Af Amer: >60 mL/min (ref >60)
GFR calc non Af Amer: >60 mL/min (ref >60)
Glucose, Bld: 117 mg/dL — ABNORMAL HIGH (ref 70–99)
Potassium: 3.8 mmol/L (ref 3.5–5.1)
Sodium: 138 mmol/L (ref 135–145)

## 2018-11-15 LAB — CBC WITH DIFFERENTIAL/PLATELET
Abs Immature Granulocytes: 0.36 10*3/uL — ABNORMAL HIGH (ref 0.00–0.07)
Basophils Absolute: 0 10*3/uL (ref 0.0–0.1)
Basophils Relative: 0 %
EOS ABS: 0.1 10*3/uL (ref 0.0–0.5)
Eosinophils Relative: 1 %
HCT: 24.1 % — ABNORMAL LOW (ref 39.0–52.0)
Hemoglobin: 8.1 g/dL — ABNORMAL LOW (ref 13.0–17.0)
Immature Granulocytes: 2 %
Lymphocytes Relative: 11 %
Lymphs Abs: 1.6 10*3/uL (ref 0.7–4.0)
MCH: 30.5 pg (ref 26.0–34.0)
MCHC: 33.6 g/dL (ref 30.0–36.0)
MCV: 90.6 fL (ref 80.0–100.0)
Monocytes Absolute: 1.7 10*3/uL — ABNORMAL HIGH (ref 0.1–1.0)
Monocytes Relative: 11 %
Neutro Abs: 11.3 10*3/uL — ABNORMAL HIGH (ref 1.7–7.7)
Neutrophils Relative %: 75 %
Platelets: 236 10*3/uL (ref 150–400)
RBC: 2.66 MIL/uL — ABNORMAL LOW (ref 4.22–5.81)
RDW: 28.4 % — ABNORMAL HIGH (ref 11.5–15.5)
WBC: 15.2 10*3/uL — ABNORMAL HIGH (ref 4.0–10.5)
nRBC: 24.3 % — ABNORMAL HIGH (ref 0.0–0.2)

## 2018-11-15 LAB — HIV ANTIBODY (ROUTINE TESTING W REFLEX): HIV Screen 4th Generation wRfx: NONREACTIVE

## 2018-11-15 MED ORDER — OXYCODONE HCL 5 MG PO TABS
10.0000 mg | ORAL_TABLET | ORAL | Status: DC | PRN
  Administered 2018-11-15 – 2018-11-17 (×10): 10 mg via ORAL
  Filled 2018-11-15 (×10): qty 2

## 2018-11-15 MED ORDER — ACETAMINOPHEN 325 MG PO TABS
650.0000 mg | ORAL_TABLET | Freq: Four times a day (QID) | ORAL | Status: DC | PRN
  Administered 2018-11-16 (×2): 650 mg via ORAL
  Filled 2018-11-15 (×2): qty 2

## 2018-11-15 NOTE — Progress Notes (Signed)
Subjective: Jerry [name], a 32 year old male with a medical history significant of sickle cell anemia was admitted and sickle cell crisis.  Patient is complaining to numbness primarily to right face and facial drooping.  Patient also reports increased drooling.  Also, pain intensity not improved.  Pain characterized as 8/10 primarily to head, chest, upper and lower extremities.  Patient denies blurred vision, dizziness, shortness of breath, dysuria, nausea, vomiting, or diarrhea.  Objective:  Vital signs in last 24 hours:  Vitals:   11/15/18 0517 11/15/18 0743 11/15/18 0853 11/15/18 0936  BP:  (!) 148/85  (!) 142/83  Pulse:  79  85  Resp: 16 20 11 12   Temp:  97.9 F (36.6 C)  98.8 F (37.1 C)  TempSrc:  Oral  Oral  SpO2: 99% 98% 99% 99%  Weight:      Height:        Intake/Output from previous day:   Intake/Output Summary (Last 24 hours) at 11/15/2018 1124 Last data filed at 11/15/2018 0936 Gross per 24 hour  Intake 740 ml  Output -  Net 740 ml    Physical Exam: General: Alert, awake, oriented x3, in no acute distress.  HEENT: [place]/AT PEERL, EOMI Neck: Trachea midline,  no masses, no thyromegal,y no JVD, no carotid bruit OROPHARYNX:  Moist, No exudate/ erythema/lesions.  Heart: Regular rate and rhythm, without murmurs, rubs, gallops, PMI non-displaced, no heaves or thrills on palpation.  Lungs: Clear to auscultation, no wheezing or rhonchi noted. No increased vocal fremitus resonant to percussion  Abdomen: Soft, nontender, nondistended, positive bowel sounds, no masses no hepatosplenomegaly noted..  Neuro: No focal neurological deficits noted cranial nerves II through XII grossly intact. DTRs 2+ bilaterally upper and lower extremities. Strength 5 out of 5 in bilateral upper and lower extremities. Musculoskeletal: No warm swelling or erythema around joints, no spinal tenderness noted. Psychiatric: Patient alert and oriented x3, good insight and cognition, good recent to  remote recall. Lymph node survey: No cervical axillary or inguinal lymphadenopathy noted.  Lab Results:  Basic Metabolic Panel:    Component Value Date/Time   NA 138 11/15/2018 0313   NA 144 10/17/2018 1203   K 3.8 11/15/2018 0313   CL 103 11/15/2018 0313   CO2 25 11/15/2018 0313   BUN 10 11/15/2018 0313   BUN 10 10/17/2018 1203   CREATININE 0.74 11/15/2018 0313   CREATININE 0.84 04/26/2017 0948   GLUCOSE 117 (H) 11/15/2018 0313   CALCIUM 9.1 11/15/2018 0313   CBC:    Component Value Date/Time   WBC 15.2 (H) 11/15/2018 0313   HGB 8.1 (L) 11/15/2018 0313   HGB 7.1 (L) 10/17/2018 1203   HCT 24.1 (L) 11/15/2018 0313   HCT 22.8 (L) 10/17/2018 1203   PLT 236 11/15/2018 0313   PLT 519 (H) 10/17/2018 1203   MCV 90.6 11/15/2018 0313   MCV 91 10/17/2018 1203   NEUTROABS 11.3 (H) 11/15/2018 0313   NEUTROABS 6.2 10/17/2018 1203   LYMPHSABS 1.6 11/15/2018 0313   LYMPHSABS 1.8 10/17/2018 1203   MONOABS 1.7 (H) 11/15/2018 0313   EOSABS 0.1 11/15/2018 0313   EOSABS 0.0 10/17/2018 1203   BASOSABS 0.0 11/15/2018 0313   BASOSABS 0.0 10/17/2018 1203    No results found for this or any previous visit (from the past 240 hour(s)).  Studies/Results: Dg Chest 2 View  Result Date: 11/14/2018 CLINICAL DATA:  Chest pain. Shortness of breath. History of sickle cell. EXAM: CHEST - 2 VIEW COMPARISON:  October 17, 2017 FINDINGS: No  pneumothorax. Mild cardiomegaly. The hila and mediastinum are unchanged. Mild opacity in the right base is favored to represent scar atelectasis. Infiltrate considered less likely. Dense bones consistent with sickle cell. IMPRESSION: 1. Persistent opacity in the right base favored to represent scar or atelectasis. Infiltrate considered less likely. 2. Dense bones consistent with sickle cell. 3. No other acute abnormalities. Electronically Signed   By: Gerome Samavid  Sarkis III M.D   On: 11/14/2018 14:32    Medications: Scheduled Meds: . enoxaparin (LOVENOX) injection  40  mg Subcutaneous Q24H  . folic acid  1 mg Oral Daily  . HYDROmorphone   Intravenous Q4H  . ketorolac  15 mg Intravenous Q6H  . senna-docusate  1 tablet Oral BID   Continuous Infusions: PRN Meds:.diphenhydrAMINE **OR** diphenhydrAMINE, naloxone **AND** sodium chloride flush, ondansetron (ZOFRAN) IV, polyethylene glycol  Assessment/Plan: Active Problems:   Sickle cell pain crisis (HCC)  Sickle cell anemia with pain crisis: Continue IVF  0.45% saline at 75 mL/h Continue custom dose Dilaudid PCA Oxycodone 10 mg every 4 hours as needed for moderate to severe breakthrough pain IV Toradol 15 mg every 6 hours Continue to evaluate vital signs closely Maintain oxygen saturation greater than 90%  Leukocytosis: WBCs decreased to 15.2 from 20.6, suspected to be reactive.  Patient afebrile.  Continue to follow CBC  Numbness to right face: Review head CT as results become available Neurological exam unremarkable   Code Status: Full Code Family Communication: N/A Disposition Plan: Not yet ready for discharge  Nolon NationsLachina Moore Phylis Javed  APRN, MSN, FNP-C [facility] [address]  [place], [place] [address] [phone]  If 7PM-7AM, please contact night-coverage.  11/15/2018, 11:24 AM  LOS: 0 days

## 2018-11-15 NOTE — Progress Notes (Signed)
Patient is complaining of facial swelling / numbness with drooling when head is turned to the right side. Paged MD on call Special educational needs teacher(Bodenheimer) - he stated sounds like part of crisis because the stroke assessment was normal - I advised patient to let us know if there is any numbness / loss of mobility or sensation in any other part of the body - if so, per MD, will do a CT scan to be safe.

## 2018-11-16 ENCOUNTER — Inpatient Hospital Stay (HOSPITAL_COMMUNITY): Payer: Self-pay

## 2018-11-16 LAB — BASIC METABOLIC PANEL
Anion gap: 9 (ref 5–15)
BUN: 11 mg/dL (ref 6–20)
CO2: 25 mmol/L (ref 22–32)
Calcium: 8.8 mg/dL — ABNORMAL LOW (ref 8.9–10.3)
Chloride: 100 mmol/L (ref 98–111)
Creatinine, Ser: 0.83 mg/dL (ref 0.61–1.24)
GFR calc Af Amer: >60 mL/min (ref >60)
GFR calc non Af Amer: >60 mL/min (ref >60)
Glucose, Bld: 101 mg/dL — ABNORMAL HIGH (ref 70–99)
POTASSIUM: 3.7 mmol/L (ref 3.5–5.1)
Sodium: 134 mmol/L — ABNORMAL LOW (ref 135–145)

## 2018-11-16 LAB — CBC
HCT: 24.8 % — ABNORMAL LOW (ref 39.0–52.0)
Hemoglobin: 8.4 g/dL — ABNORMAL LOW (ref 13.0–17.0)
MCH: 30 pg (ref 26.0–34.0)
MCHC: 33.9 g/dL (ref 30.0–36.0)
MCV: 88.6 fL (ref 80.0–100.0)
Platelets: 219 10*3/uL (ref 150–400)
RBC: 2.8 MIL/uL — AB (ref 4.22–5.81)
RDW: 26.2 % — ABNORMAL HIGH (ref 11.5–15.5)
WBC: 17.4 10*3/uL — ABNORMAL HIGH (ref 4.0–10.5)
nRBC: 15 % — ABNORMAL HIGH (ref 0.0–0.2)

## 2018-11-16 MED ORDER — SODIUM CHLORIDE 0.45 % IV SOLN
INTRAVENOUS | Status: DC
  Administered 2018-11-16 – 2018-11-17 (×3): via INTRAVENOUS

## 2018-11-16 NOTE — Progress Notes (Signed)
Subjective: Patient still has pain at 7/10. No NVD. Has been on Dilaudid PCA with corresponding oral medications.  Objective: Vital signs in last 24 hours: Temp:  [99.1 F (37.3 C)-101.1 F (38.4 C)] 99.1 F (37.3 C) (12/21 1115) Pulse Rate:  [99-112] 101 (12/21 1115) Resp:  [13-20] 15 (12/21 1123) BP: (144-159)/(78-89) 150/89 (12/21 1115) SpO2:  [96 %-100 %] 99 % (12/21 1123) Weight:  [78 kg] 78 kg (12/21 0607) Weight change: -1.3 kg Last BM Date: 11/13/18  Intake/Output from previous day: 12/20 0701 - 12/21 0700 In: 1520 [P.O.:1440] Out: 2375 [Urine:2375] Intake/Output this shift: Total I/O In: 250 [P.O.:250] Out: 900 [Urine:900]  General appearance: alert, cooperative and appears stated age Head: Normocephalic, without obvious abnormality, atraumatic Neck: no adenopathy, no carotid bruit, no JVD, supple, symmetrical, trachea midline and thyroid not enlarged, symmetric, no tenderness/mass/nodules Back: symmetric, no curvature. ROM normal. No CVA tenderness. Resp: clear to auscultation bilaterally Cardio: regular rate and rhythm, S1, S2 normal, no murmur, click, rub or gallop GI: soft, non-tender; bowel sounds normal; no masses,  no organomegaly Extremities: extremities normal, atraumatic, no cyanosis or edema Pulses: 2+ and symmetric Skin: Skin color, texture, turgor normal. No rashes or lesions  Lab Results: Recent Labs    11/15/18 0313 11/16/18 0358  WBC 15.2* 17.4*  HGB 8.1* 8.4*  HCT 24.1* 24.8*  PLT 236 219   BMET Recent Labs    11/15/18 0313 11/16/18 0358  NA 138 134*  K 3.8 3.7  CL 103 100  CO2 25 25  GLUCOSE 117* 101*  BUN 10 11  CREATININE 0.74 0.83  CALCIUM 9.1 8.8*    Studies/Results: Ct Head Wo Contrast  Result Date: 11/15/2018 CLINICAL DATA:  Facial swelling and numbness.  Sickle cell disease EXAM: CT HEAD WITHOUT CONTRAST TECHNIQUE: Contiguous axial images were obtained from the base of the skull through the vertex without intravenous  contrast. COMPARISON:  None. FINDINGS: Brain: The ventricles are normal in size and configuration. There is a small cavum septum pellucidum, an anatomic variant. There is no intracranial mass, hemorrhage, extra-axial fluid collection, or midline shift. The brain parenchyma appears normal. No acute infarct is appreciable. Vascular: There is no hyperdense vessel. There is no appreciable vascular calcification. Skull: The bony calvarium appears intact. Sinuses/Orbits: There is mild mucosal thickening in several ethmoid air cells. Other paranasal sinuses which are visualized are clear. Orbits appear symmetric bilaterally. Other: Mastoid air cells are clear. IMPRESSION: Mild mucosal thickening in several ethmoid air cells. Study otherwise unremarkable. Electronically Signed   By: Bretta BangWilliam  Woodruff III M.D.   On: 11/15/2018 12:24    Medications: I have reviewed the patient's current medications.  Assessment/Plan: Patient admitted with sickle cell painful crisis.  1. Sickle cell painful crisis: Will continue Dilaudid PCA, Toradol, IVF  2. Leucocytosis: Improving.   LOS: 1 day   Jerry [name],LAWAL 11/16/2018, 2:40 PM

## 2018-11-17 LAB — CBC WITH DIFFERENTIAL/PLATELET
Abs Immature Granulocytes: 0.13 10*3/uL — ABNORMAL HIGH (ref 0.00–0.07)
Basophils Absolute: 0 10*3/uL (ref 0.0–0.1)
Basophils Relative: 0 %
Eosinophils Absolute: 0.3 10*3/uL (ref 0.0–0.5)
Eosinophils Relative: 2 %
HCT: 22.7 % — ABNORMAL LOW (ref 39.0–52.0)
Hemoglobin: 7.6 g/dL — ABNORMAL LOW (ref 13.0–17.0)
Immature Granulocytes: 1 %
Lymphocytes Relative: 6 %
Lymphs Abs: 1.1 10*3/uL (ref 0.7–4.0)
MCH: 29.1 pg (ref 26.0–34.0)
MCHC: 33.5 g/dL (ref 30.0–36.0)
MCV: 87 fL (ref 80.0–100.0)
Monocytes Absolute: 2.1 10*3/uL — ABNORMAL HIGH (ref 0.1–1.0)
Monocytes Relative: 12 %
Neutro Abs: 14.1 10*3/uL — ABNORMAL HIGH (ref 1.7–7.7)
Neutrophils Relative %: 79 %
Platelets: 250 10*3/uL (ref 150–400)
RBC: 2.61 MIL/uL — ABNORMAL LOW (ref 4.22–5.81)
RDW: 25.3 % — ABNORMAL HIGH (ref 11.5–15.5)
WBC: 17.8 10*3/uL — ABNORMAL HIGH (ref 4.0–10.5)
nRBC: 2.5 % — ABNORMAL HIGH (ref 0.0–0.2)

## 2018-11-17 LAB — COMPREHENSIVE METABOLIC PANEL
ALK PHOS: 98 U/L (ref 38–126)
ALT: 16 U/L (ref 0–44)
AST: 21 U/L (ref 15–41)
Albumin: 4 g/dL (ref 3.5–5.0)
Anion gap: 10 (ref 5–15)
BUN: 15 mg/dL (ref 6–20)
CO2: 28 mmol/L (ref 22–32)
CREATININE: 0.81 mg/dL (ref 0.61–1.24)
Calcium: 9 mg/dL (ref 8.9–10.3)
Chloride: 101 mmol/L (ref 98–111)
GFR calc Af Amer: >60 mL/min (ref >60)
GFR calc non Af Amer: >60 mL/min (ref >60)
Glucose, Bld: 99 mg/dL (ref 70–99)
Potassium: 3.9 mmol/L (ref 3.5–5.1)
Sodium: 139 mmol/L (ref 135–145)
Total Bilirubin: 2.4 mg/dL — ABNORMAL HIGH (ref 0.3–1.2)
Total Protein: 7.7 g/dL (ref 6.5–8.1)

## 2018-11-17 MED ORDER — OXYCODONE HCL 5 MG PO TABS
10.0000 mg | ORAL_TABLET | ORAL | Status: DC
  Administered 2018-11-17 (×2): 10 mg via ORAL
  Filled 2018-11-17 (×2): qty 2

## 2018-11-17 NOTE — Discharge Summary (Signed)
Physician Discharge Summary  Patient ID: Jerry [name] MRN: [id number] DOB/AGE: 30/07/1986 30 y.o.  Admit date: 11/14/2018 Discharge date: 11/17/2018  Admission Diagnoses:  Discharge Diagnoses:  Active Problems:   Sickle cell pain crisis (HCC)   Numbness and tingling of right face   Discharged Condition: fair  Hospital Course: Patient admitted with Sickle cell painful crisis. Started on IV Dilaudid PCA, Toradol and IVF. Patient also complains of numbness of his lips. Subsequent CT head and MRI showed no CVA. He was improving but now feeling better.  Consults: None  Significant Diagnostic Studies: labs: Serial CBCs and CMPs  Treatments: IV hydration and analgesia: acetaminophen and Dilaudid  Discharge Exam: [name] pressure (!) 155/81, pulse (!) 125, temperature (!) 100.7 F (38.2 C), temperature source Oral, resp. rate 12, height 5\' 11"  (1.803 m), weight 78.4 kg, SpO2 97 %. General appearance: alert, cooperative and appears stated age Head: Normocephalic, without obvious abnormality, atraumatic Neck: no adenopathy, no carotid bruit, no JVD, supple, symmetrical, trachea midline and thyroid not enlarged, symmetric, no tenderness/mass/nodules Back: symmetric, no curvature. ROM normal. No CVA tenderness. Resp: clear to auscultation bilaterally Cardio: regular rate and rhythm, S1, S2 normal, no murmur, click, rub or gallop GI: soft, non-tender; bowel sounds normal; no masses,  no organomegaly Extremities: extremities normal, atraumatic, no cyanosis or edema Pulses: 2+ and symmetric Skin: Skin color, texture, turgor normal. No rashes or lesions  Disposition: Discharge disposition: 01-Home or Self Care       Discharge Instructions    Diet - low sodium heart healthy   Complete by:  As directed    Increase activity slowly   Complete by:  As directed      Allergies as of 11/17/2018   No Known Allergies     Medication List    TAKE these medications   folic acid 1  MG tablet Commonly known as:  FOLVITE Take 1 tablet (1 mg total) by mouth daily.   ibuprofen 800 MG tablet Commonly known as:  ADVIL,MOTRIN Take 1 tablet (800 mg total) by mouth 3 (three) times daily.   oxyCODONE 5 MG immediate release tablet Commonly known as:  Oxy IR/ROXICODONE Take 10 mg by mouth every 4 (four) hours as needed for severe pain.        SignedLonia [name]: Jerry [name],Jerry [name] 11/17/2018, 4:28 PM   Time spent is 32 minutes

## 2018-11-17 NOTE — Progress Notes (Signed)
Pt afebrile on current shift, but 101.3 on previous shift. Hr running 105-125 , last WBC 17.4. MD notified

## 2018-11-18 ENCOUNTER — Inpatient Hospital Stay (HOSPITAL_COMMUNITY)
Admission: EM | Admit: 2018-11-18 | Discharge: 2018-11-22 | DRG: 812 | Disposition: A | Discharge status: Home / Self Care | Payer: Self-pay | Attending: Internal Medicine | Admitting: Internal Medicine

## 2018-11-18 ENCOUNTER — Other Ambulatory Visit: Payer: Self-pay

## 2018-11-18 ENCOUNTER — Telehealth: Payer: Self-pay

## 2018-11-18 ENCOUNTER — Encounter (HOSPITAL_COMMUNITY): Payer: Self-pay

## 2018-11-18 DIAGNOSIS — D571 Sickle-cell disease without crisis: Secondary | ICD-10-CM

## 2018-11-18 DIAGNOSIS — Z79899 Other long term (current) drug therapy: Secondary | ICD-10-CM

## 2018-11-18 DIAGNOSIS — R509 Fever, unspecified: Secondary | ICD-10-CM

## 2018-11-18 DIAGNOSIS — G894 Chronic pain syndrome: Secondary | ICD-10-CM | POA: Diagnosis present

## 2018-11-18 DIAGNOSIS — D638 Anemia in other chronic diseases classified elsewhere: Secondary | ICD-10-CM | POA: Diagnosis present

## 2018-11-18 DIAGNOSIS — D72829 Elevated white blood cell count, unspecified: Secondary | ICD-10-CM | POA: Diagnosis present

## 2018-11-18 DIAGNOSIS — D5700 Hb-SS disease with crisis, unspecified: Secondary | ICD-10-CM

## 2018-11-18 DIAGNOSIS — D5701 Hb-SS disease with acute chest syndrome: Principal | ICD-10-CM | POA: Diagnosis present

## 2018-11-18 DIAGNOSIS — R200 Anesthesia of skin: Secondary | ICD-10-CM | POA: Diagnosis present

## 2018-11-18 MED ORDER — OXYCODONE HCL 10 MG PO TABS: 10.0000 mg | ORAL_TABLET | Freq: Four times a day (QID) | ORAL | 0 refills | Status: DC | PRN

## 2018-11-18 NOTE — Telephone Encounter (Signed)
Reviewed [place] Substance Reporting system prior to prescribing opiate medications. No inconsistencies noted.   

## 2018-11-18 NOTE — ED Provider Notes (Signed)
[facility]-EMERGENCY DEPT Provider Note   CSN: [id number] Arrival date & time: 11/18/18  2122     History   Chief Complaint Chief Complaint  Patient presents with  . Sickle Cell Pain Crisis    HPI Jerry [name] is a 32 y.o. male.  The history is provided by the patient.  Sickle Cell Pain Crisis  He has history of sickle cell disease and comes in with increased sickle cell pain over the last 24 hours.  Pain involves the left arm, left side of his trunk, left leg.  He rates pain a 10/10.  He has been taking ibuprofen without relief.  He was recently admitted to the hospital for sickle cell crisis and was discharged with prescription for oxycodone, but he has not picked the oxycodone prescription up from the pharmacy.  Also, he is complaining of numbness in the right side of his face.  This had been present while he was in the hospital and he did have a CAT scan and an MRI scan while in the hospital.  Past Medical History:  Diagnosis Date  . Sickle cell anemia [facility](HCC)     Patient Active Problem List   Diagnosis Date Noted  . Numbness and tingling of right face   . Leukocytosis   . Sickle cell pain crisis (HCC) 11/11/2017  . Vitamin D deficiency 04/26/2017  . Problems related to release from prison 01/09/2017  . 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 SICKLE CELL ANEMIA 02/15/2007  . TOBACCO ABUSE 02/15/2007  . MARIJUANA ABUSE 02/15/2007  . 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 No pertinent surgical history.      Home Medications    Prior to Admission medications   Medication Sig Start Date End Date Taking? Authorizing Provider  folic acid (FOLVITE) 1 MG tablet Take 1 tablet (1 mg total) by mouth daily. Patient not taking: Reported on 10/17/2018 07/10/18   Mike Gipouglas, Andre, FNP  ibuprofen (ADVIL,MOTRIN) 800 MG tablet Take 1 tablet (800 mg total) by mouth 3 (three) times daily. 11/14/18   Roxy HorsemanBrowning, Robert, PA-C  Oxycodone  HCl 10 MG TABS Take 1 tablet (10 mg total) by mouth every 6 (six) hours as needed for up to 15 days. 11/18/18 12/03/18  Mike Gipouglas, Andre, FNP    Family History History reviewed. No pertinent family history.  Social History Social History   Tobacco Use  . Smoking status: Never Smoker  . Smokeless tobacco: Never Used  Substance Use Topics  . Alcohol use: No  . Drug use: Yes    Types: Marijuana    Comment: occasionally     Allergies   Patient has no known allergies.   Review of Systems Review of Systems  All other systems reviewed and are negative.    Physical Exam Updated Vital Signs BP 137/73 (BP Location: Right Arm)   Pulse (!) 113   Temp 100.2 F (37.9 C) (Oral)   Resp 15   Ht 5\' 11"  (1.803 m)   Wt 78 kg   SpO2 96%   BMI 23.99 kg/m   Physical Exam Vitals signs and nursing note reviewed.    32 year old male, resting comfortably and in no acute distress. Vital signs are significant for rapid heart rate. Oxygen saturation is 96%, which is normal. Head is normocephalic and atraumatic. PERRLA, EOMI. Oropharynx is clear. Neck is nontender and supple without adenopathy or JVD. Back is nontender and there is no CVA tenderness. Lungs are clear without rales,  wheezes, or rhonchi. Chest is nontender. Heart has regular rate and rhythm without murmur. Abdomen is soft, flat, nontender without masses or hepatosplenomegaly and peristalsis is normoactive. Extremities have no cyanosis or edema, full range of motion is present.  There is tenderness to palpation rather diffusely through his left arm and left leg. Skin is warm and dry without rash. Neurologic: Mental status is normal, cranial nerves are significant for decreased sensation in the second and third divisions of the trigeminal nerve on the right.  There is no facial droop and tongue protrudes in the midline.  No other cranial nerve deficits identified. There are no other motor or sensory deficits.  ED Treatments /  Results  Labs (all labs ordered are listed, but only abnormal results are displayed) Labs Reviewed  COMPREHENSIVE METABOLIC PANEL - Abnormal; Notable for the following components:      Result Value   Glucose, Bld 114 (*)    Total Protein 8.3 (*)    Total Bilirubin 2.2 (*)    All other components within normal limits  CBC WITH DIFFERENTIAL/PLATELET - Abnormal; Notable for the following components:   WBC 20.6 (*)    RBC 2.32 (*)    Hemoglobin 6.6 (*)    HCT 20.0 (*)    RDW 26.4 (*)    nRBC 0.9 (*)    Neutro Abs 16.4 (*)    Monocytes Absolute 2.0 (*)    Abs Immature Granulocytes 0.22 (*)    All other components within normal limits  RETICULOCYTES - Abnormal; Notable for the following components:   Retic Ct Pct 13.2 (*)    RBC. 2.32 (*)    Retic Count, Absolute 306.2 (*)    Immature Retic Fract 26.4 (*)    All other components within normal limits  RAPID URINE DRUG SCREEN, HOSP PERFORMED  URINALYSIS, ROUTINE W REFLEX MICROSCOPIC  TYPE AND SCREEN  PREPARE RBC (CROSSMATCH)    Procedures Procedures  CRITICAL CARE Performed by: Dione Boozeavid Arush Gatliff Total critical care time: 40 minutes Critical care time was exclusive of separately billable procedures and treating other patients. Critical care was necessary to treat or prevent imminent or life-threatening deterioration. Critical care was time spent personally by me on the following activities: development of treatment plan with patient and/or surrogate as well as nursing, discussions with consultants, evaluation of patient's response to treatment, examination of patient, obtaining history from patient or surrogate, ordering and performing treatments and interventions, ordering and review of laboratory studies, ordering and review of radiographic studies, pulse oximetry and re-evaluation of patient's condition.  Medications Ordered in ED Medications  0.45 % sodium chloride infusion ( Intravenous New Bag/Given (Non-Interop) 11/19/18 0021)    HYDROmorphone (DILAUDID) injection 1 mg (has no administration in time range)    Or  HYDROmorphone (DILAUDID) injection 1 mg (has no administration in time range)  0.9 %  sodium chloride infusion (has no administration in time range)  ketorolac (TORADOL) 30 MG/ML injection 30 mg (30 mg Intravenous Given 11/19/18 0017)  HYDROmorphone (DILAUDID) injection 0.5 mg (0.5 mg Intravenous Given 11/19/18 0015)    Or  HYDROmorphone (DILAUDID) injection 0.5 mg ( Subcutaneous See Alternative 11/19/18 0015)  HYDROmorphone (DILAUDID) injection 1 mg (1 mg Intravenous Given 11/19/18 0059)    Or  HYDROmorphone (DILAUDID) injection 1 mg ( Subcutaneous See Alternative 11/19/18 0059)  HYDROmorphone (DILAUDID) injection 1 mg (1 mg Intravenous Given 11/19/18 0134)    Or  HYDROmorphone (DILAUDID) injection 1 mg ( Subcutaneous See Alternative 11/19/18 0134)  Initial Impression / Assessment and Plan / ED Course  I have reviewed the triage vital signs and the nursing notes.  Pertinent labs & imaging results that were available during my care of the patient were reviewed by me and considered in my medical decision making (see chart for details).  Sickle cell pain crisis.  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  Old records reviewed confirming recent hospitalization and negative MRI of the brain.  He is started back on the sickle cell pain protocol.  I suspect he might have been able to manage this at home had he picked up his narcotic prescription.  Probably will need neurology evaluation as an outpatient.  He was getting reasonable relief of pain with above-noted treatment.  However, labs show hemoglobin 6.6.  On 12/22, hemoglobin was 7.6, and 8.4 on 12/21.  This progressive drop in hemoglobin over short period time is worrisome for possible hemolytic crisis.  He will be given a blood transfusion.  Case is discussed with Dr. Toniann Fail of  [facility], who agrees to admit the patient.  Final Clinical Impressions(s) / ED Diagnoses   Final diagnoses:  Sickle cell pain crisis [facility])    ED Discharge Orders    None       Dione Booze, MD 11/19/18 [phone]

## 2018-11-18 NOTE — ED Triage Notes (Signed)
Pt reports that he is experiencing a sickle cell crisis that started yesterday. He states that the pain is in his L leg and L flank. A&Ox4. Ambulatory. Denies N/V/D.

## 2018-11-19 ENCOUNTER — Encounter (HOSPITAL_COMMUNITY): Payer: Self-pay | Admitting: Internal Medicine

## 2018-11-19 ENCOUNTER — Inpatient Hospital Stay (HOSPITAL_COMMUNITY): Payer: Self-pay

## 2018-11-19 DIAGNOSIS — D571 Sickle-cell disease without crisis: Secondary | ICD-10-CM | POA: Diagnosis present

## 2018-11-19 DIAGNOSIS — D5700 Hb-SS disease with crisis, unspecified: Secondary | ICD-10-CM

## 2018-11-19 LAB — CBC WITH DIFFERENTIAL/PLATELET
ABS IMMATURE GRANULOCYTES: 0.22 10*3/uL — AB (ref 0.00–0.07)
Abs Immature Granulocytes: 0.11 10*3/uL — ABNORMAL HIGH (ref 0.00–0.07)
Basophils Absolute: 0 10*3/uL (ref 0.0–0.1)
Basophils Absolute: 0.1 10*3/uL (ref 0.0–0.1)
Basophils Relative: 0 %
Basophils Relative: 0 %
EOS PCT: 3 %
Eosinophils Absolute: 0.4 10*3/uL (ref 0.0–0.5)
Eosinophils Absolute: 0.5 10*3/uL (ref 0.0–0.5)
Eosinophils Relative: 2 %
HCT: 20 % — ABNORMAL LOW (ref 39.0–52.0)
HCT: 21.4 % — ABNORMAL LOW (ref 39.0–52.0)
Hemoglobin: 6.6 g/dL — CL (ref 13.0–17.0)
Hemoglobin: 7 g/dL — ABNORMAL LOW (ref 13.0–17.0)
Immature Granulocytes: 1 %
Immature Granulocytes: 1 %
Lymphocytes Relative: 8 %
Lymphocytes Relative: 20 %
Lymphs Abs: 1.6 10*3/uL (ref 0.7–4.0)
Lymphs Abs: 3 10*3/uL (ref 0.7–4.0)
MCH: 28.4 pg (ref 26.0–34.0)
MCH: 29.2 pg (ref 26.0–34.0)
MCHC: 33 g/dL (ref 30.0–36.0)
MCHC: 32.7 g/dL (ref 30.0–36.0)
MCV: 86.2 fL (ref 80.0–100.0)
MCV: 89.2 fL (ref 80.0–100.0)
Monocytes Absolute: 2 10*3/uL — ABNORMAL HIGH (ref 0.1–1.0)
Monocytes Absolute: 2 10*3/uL — ABNORMAL HIGH (ref 0.1–1.0)
Monocytes Relative: 9 %
Monocytes Relative: 13 %
NEUTROS ABS: 16.4 10*3/uL — AB (ref 1.7–7.7)
Neutro Abs: 9.9 10*3/uL — ABNORMAL HIGH (ref 1.7–7.7)
Neutrophils Relative %: 80 %
Neutrophils Relative %: 63 %
Platelets: 308 10*3/uL (ref 150–400)
Platelets: 289 10*3/uL (ref 150–400)
RBC: 2.32 MIL/uL — ABNORMAL LOW (ref 4.22–5.81)
RBC: 2.4 MIL/uL — ABNORMAL LOW (ref 4.22–5.81)
RDW: 26.4 % — ABNORMAL HIGH (ref 11.5–15.5)
RDW: 24.6 % — ABNORMAL HIGH (ref 11.5–15.5)
WBC: 20.6 10*3/uL — ABNORMAL HIGH (ref 4.0–10.5)
WBC: 15.5 10*3/uL — ABNORMAL HIGH (ref 4.0–10.5)
nRBC: 0.9 % — ABNORMAL HIGH (ref 0.0–0.2)
nRBC: 1.1 % — ABNORMAL HIGH (ref 0.0–0.2)

## 2018-11-19 LAB — RETICULOCYTES
Immature Retic Fract: 26.4 % — ABNORMAL HIGH (ref 2.3–15.9)
RBC.: 2.32 MIL/uL — ABNORMAL LOW (ref 4.22–5.81)
Retic Count, Absolute: 306.2 10*3/uL — ABNORMAL HIGH (ref 19.0–186.0)
Retic Ct Pct: 13.2 % — ABNORMAL HIGH (ref 0.4–3.1)

## 2018-11-19 LAB — COMPREHENSIVE METABOLIC PANEL
ALT: 27 U/L (ref 0–44)
ALT: 32 U/L (ref 0–44)
AST: 22 U/L (ref 15–41)
AST: 32 U/L (ref 15–41)
Albumin: 3.9 g/dL (ref 3.5–5.0)
Albumin: 4.4 g/dL (ref 3.5–5.0)
Alkaline Phosphatase: 85 U/L (ref 38–126)
Alkaline Phosphatase: 97 U/L (ref 38–126)
Anion gap: 10 (ref 5–15)
Anion gap: 9 (ref 5–15)
BUN: 14 mg/dL (ref 6–20)
BUN: 15 mg/dL (ref 6–20)
CO2: 27 mmol/L (ref 22–32)
CO2: 27 mmol/L (ref 22–32)
CREATININE: 0.82 mg/dL (ref 0.61–1.24)
Calcium: 9.1 mg/dL (ref 8.9–10.3)
Calcium: 9.3 mg/dL (ref 8.9–10.3)
Chloride: 102 mmol/L (ref 98–111)
Chloride: 103 mmol/L (ref 98–111)
Creatinine, Ser: 0.84 mg/dL (ref 0.61–1.24)
GFR calc Af Amer: >60 mL/min (ref >60)
GFR calc Af Amer: >60 mL/min (ref >60)
GFR calc non Af Amer: >60 mL/min (ref >60)
GFR calc non Af Amer: >60 mL/min (ref >60)
Glucose, Bld: 114 mg/dL — ABNORMAL HIGH (ref 70–99)
Glucose, Bld: 98 mg/dL (ref 70–99)
Potassium: 3.8 mmol/L (ref 3.5–5.1)
Potassium: 3.8 mmol/L (ref 3.5–5.1)
Sodium: 138 mmol/L (ref 135–145)
Sodium: 140 mmol/L (ref 135–145)
Total Bilirubin: 2 mg/dL — ABNORMAL HIGH (ref 0.3–1.2)
Total Bilirubin: 2.2 mg/dL — ABNORMAL HIGH (ref 0.3–1.2)
Total Protein: 7.3 g/dL (ref 6.5–8.1)
Total Protein: 8.3 g/dL — ABNORMAL HIGH (ref 6.5–8.1)

## 2018-11-19 LAB — URINALYSIS, ROUTINE W REFLEX MICROSCOPIC
Bacteria, UA: NONE SEEN
Bilirubin Urine: NEGATIVE
Glucose, UA: NEGATIVE mg/dL
Hgb urine dipstick: NEGATIVE
Ketones, ur: NEGATIVE mg/dL
Leukocytes, UA: NEGATIVE
Nitrite: NEGATIVE
Protein, ur: 100 mg/dL — AB
Specific Gravity, Urine: 1.011 (ref 1.005–1.030)
pH: 6 (ref 5.0–8.0)

## 2018-11-19 LAB — LACTATE DEHYDROGENASE: LDH: 383 U/L — ABNORMAL HIGH (ref 98–192)

## 2018-11-19 LAB — STREP PNEUMONIAE URINARY ANTIGEN: Strep Pneumo Urinary Antigen: NEGATIVE

## 2018-11-19 LAB — PREPARE RBC (CROSSMATCH)

## 2018-11-19 LAB — ABO/RH: ABO/RH(D): B POS

## 2018-11-19 LAB — BASIC METABOLIC PANEL
Anion gap: 11 (ref 5–15)
BUN: 13 mg/dL (ref 6–20)
CALCIUM: 8.8 mg/dL — AB (ref 8.9–10.3)
CO2: 25 mmol/L (ref 22–32)
Chloride: 103 mmol/L (ref 98–111)
Creatinine, Ser: 0.93 mg/dL (ref 0.61–1.24)
GFR calc Af Amer: >60 mL/min (ref >60)
GFR calc non Af Amer: >60 mL/min (ref >60)
Glucose, Bld: 119 mg/dL — ABNORMAL HIGH (ref 70–99)
Potassium: 3.6 mmol/L (ref 3.5–5.1)
Sodium: 139 mmol/L (ref 135–145)

## 2018-11-19 LAB — RAPID URINE DRUG SCREEN, HOSP PERFORMED
Amphetamines: NOT DETECTED
Barbiturates: NOT DETECTED
Benzodiazepines: NOT DETECTED
Cocaine: NOT DETECTED
Opiates: POSITIVE — AB
Tetrahydrocannabinol: NOT DETECTED

## 2018-11-19 LAB — INFLUENZA PANEL BY PCR (TYPE A & B)
Influenza A By PCR: NEGATIVE
Influenza B By PCR: NEGATIVE

## 2018-11-19 MED ORDER — SODIUM CHLORIDE 0.9% FLUSH: 9.0000 mL | INTRAVENOUS | Status: DC | PRN

## 2018-11-19 MED ORDER — SENNOSIDES-DOCUSATE SODIUM 8.6-50 MG PO TABS
1.0000 | ORAL_TABLET | Freq: Two times a day (BID) | ORAL | Status: DC
  Administered 2018-11-19 – 2018-11-22 (×6): 1 via ORAL
  Filled 2018-11-19 (×7): qty 1

## 2018-11-19 MED ORDER — ONDANSETRON HCL 4 MG/2ML IJ SOLN: 4.0000 mg | Freq: Four times a day (QID) | INTRAMUSCULAR | Status: DC | PRN

## 2018-11-19 MED ORDER — SODIUM CHLORIDE 0.45 % IV SOLN
INTRAVENOUS | Status: AC
  Administered 2018-11-19: 06:00:00 via INTRAVENOUS

## 2018-11-19 MED ORDER — HYDROMORPHONE HCL 1 MG/ML IJ SOLN
0.5000 mg | INTRAMUSCULAR | Status: AC
  Administered 2018-11-19: 0.5 mg via INTRAVENOUS
  Filled 2018-11-19: qty 1

## 2018-11-19 MED ORDER — HYDROMORPHONE HCL 1 MG/ML IJ SOLN: 0.5000 mg | INTRAMUSCULAR | Status: AC

## 2018-11-19 MED ORDER — DIPHENHYDRAMINE HCL 25 MG PO CAPS
25.0000 mg | ORAL_CAPSULE | ORAL | Status: DC | PRN
  Administered 2018-11-20 – 2018-11-22 (×2): 25 mg via ORAL
  Filled 2018-11-19 (×2): qty 1

## 2018-11-19 MED ORDER — OXYCODONE HCL 5 MG PO TABS
10.0000 mg | ORAL_TABLET | ORAL | Status: DC | PRN
  Administered 2018-11-19 – 2018-11-22 (×11): 10 mg via ORAL
  Filled 2018-11-19 (×11): qty 2

## 2018-11-19 MED ORDER — POLYETHYLENE GLYCOL 3350 17 G PO PACK: 17.0000 g | PACK | Freq: Every day | ORAL | Status: DC | PRN

## 2018-11-19 MED ORDER — HYDROMORPHONE HCL 1 MG/ML IJ SOLN: 1.0000 mg | INTRAMUSCULAR | Status: AC

## 2018-11-19 MED ORDER — HYDROMORPHONE 1 MG/ML IV SOLN
INTRAVENOUS | Status: DC
  Administered 2018-11-19: 4.5 mg via INTRAVENOUS
  Administered 2018-11-19: 1 mg via INTRAVENOUS
  Administered 2018-11-19: 5.9 mg via INTRAVENOUS
  Administered 2018-11-19: 2.5 mg via INTRAVENOUS
  Administered 2018-11-19: 7 mg via INTRAVENOUS
  Administered 2018-11-20: 8 mg via INTRAVENOUS
  Administered 2018-11-20: 1.5 mg via INTRAVENOUS
  Administered 2018-11-20: 4.5 mg via INTRAVENOUS
  Administered 2018-11-20: 5.5 mg via INTRAVENOUS
  Administered 2018-11-21: 6 mg via INTRAVENOUS
  Administered 2018-11-21: 4 mg via INTRAVENOUS
  Administered 2018-11-21: 1.79 mg via INTRAVENOUS
  Administered 2018-11-21: 9 mg via INTRAVENOUS
  Administered 2018-11-21: 30 mg via INTRAVENOUS
  Administered 2018-11-21: 10 mg via INTRAVENOUS
  Administered 2018-11-21: 2 mg via INTRAVENOUS
  Administered 2018-11-21: 8 mg via INTRAVENOUS
  Administered 2018-11-22: 3 mg via INTRAVENOUS
  Administered 2018-11-22: 30 mg via INTRAVENOUS
  Administered 2018-11-22: 8 mg via INTRAVENOUS
  Filled 2018-11-19 (×3): qty 30

## 2018-11-19 MED ORDER — NALOXONE HCL 0.4 MG/ML IJ SOLN: 0.4000 mg | INTRAMUSCULAR | Status: DC | PRN

## 2018-11-19 MED ORDER — KETOROLAC TROMETHAMINE 30 MG/ML IJ SOLN
30.0000 mg | INTRAMUSCULAR | Status: AC
  Administered 2018-11-19: 30 mg via INTRAVENOUS
  Filled 2018-11-19: qty 1

## 2018-11-19 MED ORDER — SODIUM CHLORIDE 0.45 % IV SOLN
INTRAVENOUS | Status: DC
  Administered 2018-11-19: via INTRAVENOUS

## 2018-11-19 MED ORDER — HYDROMORPHONE HCL 1 MG/ML IJ SOLN: 1.0000 mg | INTRAMUSCULAR | Status: DC

## 2018-11-19 MED ORDER — LEVOFLOXACIN IN D5W 750 MG/150ML IV SOLN
750.0000 mg | INTRAVENOUS | Status: DC
  Administered 2018-11-19 – 2018-11-22 (×4): 750 mg via INTRAVENOUS
  Filled 2018-11-19 (×4): qty 150

## 2018-11-19 MED ORDER — SODIUM CHLORIDE 0.9 % IV SOLN
10.0000 mL/h | Freq: Once | INTRAVENOUS | Status: AC
  Administered 2018-11-19: 10 mL/h via INTRAVENOUS

## 2018-11-19 MED ORDER — HYDROMORPHONE 1 MG/ML IV SOLN
INTRAVENOUS | Status: DC
  Administered 2018-11-19: 30 mg via INTRAVENOUS
  Filled 2018-11-19: qty 30

## 2018-11-19 MED ORDER — KETOROLAC TROMETHAMINE 15 MG/ML IJ SOLN
15.0000 mg | Freq: Four times a day (QID) | INTRAMUSCULAR | Status: AC
  Administered 2018-11-19 – 2018-11-20 (×5): 15 mg via INTRAVENOUS
  Filled 2018-11-19 (×5): qty 1

## 2018-11-19 MED ORDER — HYDROMORPHONE HCL 1 MG/ML IJ SOLN
1.0000 mg | INTRAMUSCULAR | Status: AC
  Administered 2018-11-19: 1 mg via INTRAVENOUS
  Filled 2018-11-19: qty 1

## 2018-11-19 MED ORDER — ENOXAPARIN SODIUM 40 MG/0.4ML ~~LOC~~ SOLN
40.0000 mg | Freq: Every day | SUBCUTANEOUS | Status: DC
  Administered 2018-11-19 – 2018-11-20 (×2): 40 mg via SUBCUTANEOUS
  Filled 2018-11-19 (×2): qty 0.4

## 2018-11-19 NOTE — Progress Notes (Signed)
Subjective: Jerry [name], 32 year old male that was admitted this a.m. and sickle cell crisis and  acute chest syndrome.  Patient was discharged from inpatient services on 11/16/2018.  He states he requested to be discharged at that time due to important family constraints.  He states that pain intensity increased 45 minutes after he was discharged.  He endorses worsening pain primarily to left chest and left lower extremity.  Patient notified primary provider and was instructed to report to the emergency department.  Today, patient continues to endorse left chest and left lower extremity pain. Pain intensity is 9/10 characterized as constant and sharp.  He denies headache, dizziness, blurred vision, dysuria, nausea, vomiting, or diarrhea.   Objective:  Vital signs in last 24 hours:  Vitals:   11/19/18 0645 11/19/18 0800 11/19/18 0948 11/19/18 1218  BP: 122/75  137/86   Pulse: 94  91   Resp: 15 16 16 10   Temp: 98.9 F (37.2 C)  98.8 F (37.1 C)   TempSrc: Oral  Oral   SpO2:  95% 96% 97%  Weight:      Height:        Intake/Output from previous day:   Intake/Output Summary (Last 24 hours) at 11/19/2018 1233 Last data filed at 11/19/2018 0645 Gross per 24 hour  Intake 513.51 ml  Output -  Net 513.51 ml    Physical Exam: General: Alert, awake, oriented x3, in no acute distress.  HEENT: [place]/AT PEERL, EOMI Neck: Trachea midline,  no masses, no thyromegal,y no JVD, no carotid bruit OROPHARYNX:  Moist, No exudate/ erythema/lesions.  Heart: Regular rate and rhythm, without murmurs, rubs, gallops, PMI non-displaced, no heaves or thrills on palpation.  Lungs: Clear to auscultation, no wheezing or rhonchi noted. No increased vocal fremitus resonant to percussion  Abdomen: Soft, nontender, nondistended, positive bowel sounds, no masses no hepatosplenomegaly noted..  Neuro: No focal neurological deficits noted cranial nerves II through XII grossly intact. DTRs 2+ bilaterally upper  and lower extremities. Strength 5 out of 5 in bilateral upper and lower extremities. Musculoskeletal: No warm swelling or erythema around joints, no spinal tenderness noted. Psychiatric: Patient alert and oriented x3, good insight and cognition, good recent to remote recall. Lymph node survey: No cervical axillary or inguinal lymphadenopathy noted.  Lab Results:  Basic Metabolic Panel:    Component Value Date/Time   NA 140 11/19/2018 1014   NA 144 10/17/2018 1203   K 3.8 11/19/2018 1014   CL 103 11/19/2018 1014   CO2 27 11/19/2018 1014   BUN 14 11/19/2018 1014   BUN 10 10/17/2018 1203   CREATININE 0.82 11/19/2018 1014   CREATININE 0.84 04/26/2017 0948   GLUCOSE 98 11/19/2018 1014   CALCIUM 9.1 11/19/2018 1014   CBC:    Component Value Date/Time   WBC 15.5 (H) 11/19/2018 1014   HGB 7.0 (L) 11/19/2018 1014   HGB 7.1 (L) 10/17/2018 1203   HCT 21.4 (L) 11/19/2018 1014   HCT 22.8 (L) 10/17/2018 1203   PLT 289 11/19/2018 1014   PLT 519 (H) 10/17/2018 1203   MCV 89.2 11/19/2018 1014   MCV 91 10/17/2018 1203   NEUTROABS 9.9 (H) 11/19/2018 1014   NEUTROABS 6.2 10/17/2018 1203   LYMPHSABS 3.0 11/19/2018 1014   LYMPHSABS 1.8 10/17/2018 1203   MONOABS 2.0 (H) 11/19/2018 1014   EOSABS 0.5 11/19/2018 1014   EOSABS 0.0 10/17/2018 1203   BASOSABS 0.1 11/19/2018 1014   BASOSABS 0.0 10/17/2018 1203    No results found for this  or any previous visit (from the past 240 hour(s)).  Studies/Results: Dg Chest 2 View  Result Date: 11/19/2018 CLINICAL DATA:  Acute onset of cough, fever and body aches. EXAM: CHEST - 2 VIEW COMPARISON:  Chest radiograph performed 11/14/2018 FINDINGS: The lungs are well-aerated. Mild right midlung and persistent mild bibasilar airspace opacities may reflect atelectasis or possibly mild pneumonia, given the patient's symptoms. There is no evidence of pleural effusion or pneumothorax. The heart is normal in size; the mediastinal contour is within normal limits.  No acute osseous abnormalities are seen. IMPRESSION: Mild right midlung and persistent mild bibasilar airspace opacities may reflect atelectasis or possibly mild pneumonia, given the patient's symptoms. Electronically Signed   By: Roanna RaiderJeffery  Chang M.D.   On: 11/19/2018 02:15    Medications: Scheduled Meds: . enoxaparin (LOVENOX) injection  40 mg Subcutaneous Daily  . HYDROmorphone   Intravenous Q4H  . ketorolac  15 mg Intravenous Q6H  . senna-docusate  1 tablet Oral BID   Continuous Infusions: . sodium chloride 75 mL/hr at 11/19/18 0541  . levofloxacin (LEVAQUIN) IV 750 mg (11/19/18 1104)   PRN Meds:.diphenhydrAMINE, naloxone **AND** sodium chloride flush, ondansetron (ZOFRAN) IV, polyethylene glycol    Procedures:  2 units PRBCs  Antibiotics:  Levoquin per pharmacy consult  Assessment/Plan: Principal Problem:   Sickle cell pain crisis (HCC) Active Problems:   Sickle cell anemia (HCC)  Sickle cell pain crisis:  Dilaudid PCA initiated with settings 0.5 mg hours, 10-minute lockout, 3 mg/h. Patient is s/p 2 units of PRBCs.  Hemoglobin has improved to 7.1.  Maintain oxygen saturation above 90% IV Toradol 30 mg every 6 hours Monitor vital signs very closely Reevaluate pain scale consistently  Leukocytosis: WBCs have improved to 15.5.  Patient afebrile.  Blood cultures negative.  Continue to follow CBC  Anemia of chronic disease: Hemoglobin 7.0, which is below baseline.  Repeat CBC in a.m. Continue folic acid 1 mg Restart hydroxyurea  Chronic pain syndrome: Oxycodone 10 mg every 4 hours as needed for moderate to severe breakthrough pain  Acute chest syndrome: Mild acute chest syndrome.  Levaquin initiated on admission  Oxygen saturation above 90% on RA Incentive spirometry 0.45% saline at 75 mL/h    Code Status: Full Code Family Communication: N/A Disposition Plan: Not yet ready for discharge  Nolon NationsLachina Moore Raymonde Hamblin  APRN, MSN, FNP-C [facility] [address]  [place], [place] [address] [phone]  If 5PM-7AM, please contact night-coverage.  11/19/2018, 12:33 PM  LOS: 0 days

## 2018-11-19 NOTE — ED Notes (Signed)
Dr. Preston FleetingGlick made aware of pt Hgb of 6.6

## 2018-11-19 NOTE — Progress Notes (Signed)
Pharmacy Antibiotic Note  Jerry [name] is a 32 y.o. male admitted on 11/18/2018 with sickle cell crisis.  To start levaquin for suspected PNA.  - 12/24 CXR: Mild right midlung and persistent mild bibasilar airspace opacities may reflect atelectasis or possibly mild pneumonia - Tmax 100.2, wbc up 20.6, scr 0.84 (crcl~137)   Plan: - levaquin 750 mg IV q24h  _____________________________________  Height: 5\' 11"  (180.3 cm) Weight: 171 lb 8.3 oz (77.8 kg) IBW/kg (Calculated) : 75.3  Temp (24hrs), Avg:99.1 F (37.3 C), Min:98.6 F (37 C), Max:100.2 F (37.9 C)  Recent Labs  Lab 11/14/18 1258 11/15/18 0313 11/16/18 0358 11/17/18 0758 11/19/18 0015  WBC 20.6* 15.2* 17.4* 17.8* 20.6*  CREATININE 0.91 0.74 0.83 0.81 0.84    Estimated Creatinine Clearance: 137 mL/min (by C-G formula based on SCr of 0.84 mg/dL).    No Known Allergies   Thank you for allowing pharmacy to be a part of this patient's care.  Lucia Gaskinsham, Akia Montalban P 11/19/2018 7:32 AM

## 2018-11-19 NOTE — ED Notes (Signed)
ED TO INPATIENT HANDOFF REPORT  Name/Age/Gender Jerry [name] 32 y.o. male  Code Status Code Status History    Date Active Date Inactive Code Status Order ID Comments User Context   11/14/2018 2101 11/17/2018 2006 Full Code [id number]  Massie Maroon, FNP Inpatient   11/11/2017 2247 11/16/2017 1851 Full Code [id number]  Elder Love, MD Inpatient      Home/SNF/Other Home  Chief Complaint Sickle Cell Pain Crisis  Level of Care/Admitting Diagnosis ED Disposition    ED Disposition Condition Comment   Admit  Hospital Area: [facility] [[id number]]  Level of Care: Med-Surg [16]  Diagnosis: Sickle cell anemia [facility]) [[id number]]  Admitting Physician: Eduard Clos [phone]  Attending Physician: Eduard Clos [phone]  Estimated length of stay: past midnight tomorrow  Certification:: I certify this patient will need inpatient services for at least 2 midnights  PT Class (Do Not Modify): Inpatient [101]  PT Acc Code (Do Not Modify): Private [1]       Medical History Past Medical History:  Diagnosis Date  . Sickle cell anemia (HCC)     Allergies No Known Allergies  IV Location/Drains/Wounds Patient Lines/Drains/Airways Status   Active Line/Drains/Airways    Name:   Placement date:   Placement time:   Site:   Days:   Peripheral IV 11/19/18 Right Forearm   11/19/18    0014    Forearm   less than 1   Peripheral IV 11/19/18 Left Arm   11/19/18    0241    Arm   less than 1          Labs/Imaging Results for orders placed or performed during the hospital encounter of 11/18/18 (from the past 48 hour(s))  Comprehensive metabolic panel     Status: Abnormal   Collection Time: 11/19/18 12:15 AM  Result Value Ref Range   Sodium 138 135 - 145 mmol/L   Potassium 3.8 3.5 - 5.1 mmol/L   Chloride 102 98 - 111 mmol/L   CO2 27 22 - 32 mmol/L   Glucose, Bld 114 (H) 70 - 99 mg/dL   BUN 15 6 - 20 mg/dL   Creatinine, Ser 8.46 0.61 - 1.24 mg/dL   Calcium 9.3 8.9 - 96.2 mg/dL   Total Protein 8.3 (H) 6.5 - 8.1 g/dL   Albumin 4.4 3.5 - 5.0 g/dL   AST 32 15 - 41 U/L   ALT 32 0 - 44 U/L   Alkaline Phosphatase 97 38 - 126 U/L   Total Bilirubin 2.2 (H) 0.3 - 1.2 mg/dL   GFR calc non Af Amer >60 >60 mL/min   GFR calc Af Amer >60 >60 mL/min   Anion gap 9 5 - 15    Comment: Performed at [facility], [address], [place], [place] [address]  CBC with Differential     Status: Abnormal   Collection Time: 11/19/18 12:15 AM  Result Value Ref Range   WBC 20.6 (H) 4.0 - 10.5 K/uL   RBC 2.32 (L) 4.22 - 5.81 MIL/uL   Hemoglobin 6.6 (LL) 13.0 - 17.0 g/dL    Comment: REPEATED TO VERIFY THIS CRITICAL RESULT HAS VERIFIED AND BEEN CALLED TO A,WOOD BY AISHA MOHAMED ON 12 24 2019 AT 0113, AND HAS BEEN READ BACK.     HCT 20.0 (L) 39.0 - 52.0 %   MCV 86.2 80.0 - 100.0 fL   MCH 28.4 26.0 - 34.0 pg   MCHC 33.0 30.0 - 36.0 g/dL  RDW 26.4 (H) 11.5 - 15.5 %   Platelets 308 150 - 400 K/uL   nRBC 0.9 (H) 0.0 - 0.2 %   Neutrophils Relative % 80 %   Neutro Abs 16.4 (H) 1.7 - 7.7 K/uL   Lymphocytes Relative 8 %   Lymphs Abs 1.6 0.7 - 4.0 K/uL   Monocytes Relative 9 %   Monocytes Absolute 2.0 (H) 0.1 - 1.0 K/uL   Eosinophils Relative 2 %   Eosinophils Absolute 0.4 0.0 - 0.5 K/uL   Basophils Relative 0 %   Basophils Absolute 0.0 0.0 - 0.1 K/uL   Immature Granulocytes 1 %   Abs Immature Granulocytes 0.22 (H) 0.00 - 0.07 K/uL   Polychromasia PRESENT    Sickle Cells PRESENT    Target Cells PRESENT     Comment: Performed at [facility], [address] [place], [place] [address]  Reticulocytes     Status: Abnormal   Collection Time: 11/19/18 12:15 AM  Result Value Ref Range   Retic Ct Pct 13.2 (H) 0.4 - 3.1 %    Comment: REPEATED TO VERIFY CONFIRMED BY MANUAL DILUTION    RBC. 2.32 (L) 4.22 - 5.81 MIL/uL   Retic Count, Absolute 306.2 (H) 19.0 - 186.0 K/uL   Immature Retic Fract 26.4 (H) 2.3 - 15.9 %     Comment: Performed at [facility], [address] [place], [place] [address]   Dg Chest 2 View  Result Date: 11/19/2018 CLINICAL DATA:  Acute onset of cough, fever and body aches. EXAM: CHEST - 2 VIEW COMPARISON:  Chest radiograph performed 11/14/2018 FINDINGS: The lungs are well-aerated. Mild right midlung and persistent mild bibasilar airspace opacities may reflect atelectasis or possibly mild pneumonia, given the patient's symptoms. There is no evidence of pleural effusion or pneumothorax. The heart is normal in size; the mediastinal contour is within normal limits. No acute osseous abnormalities are seen. IMPRESSION: Mild right midlung and persistent mild bibasilar airspace opacities may reflect atelectasis or possibly mild pneumonia, given the patient's symptoms. Electronically Signed   By: Roanna RaiderJeffery  Chang M.D.   On: 11/19/2018 02:15    Pending Labs Unresulted Labs (From admission, onward)    Start     Ordered   11/19/18 0141  Type and screen [facility]  Once,   STAT    Comments:  [facility]    11/19/18 0140   11/19/18 0141  Prepare RBC  (Adult Blood Administration - PRBC)  Once,   R    Question Answer Comment  # of Units 2 units   Transfusion Indications Sickle cell crisis   If emergent release call blood bank Not emergent release      11/19/18 0140   11/19/18 0003  Urine rapid drug screen (hosp performed)  ONCE - STAT,   STAT     11/19/18 0002   11/19/18 0003  Urinalysis, Routine w reflex microscopic  ONCE - STAT,   STAT     11/19/18 0002          Vitals/Pain Today's Vitals   11/19/18 0100 11/19/18 0130 11/19/18 0230 11/19/18 0243  BP: 131/69 129/73 134/74   Pulse: (!) 103 (!) 108 (!) 104   Resp: (!) 21 20 18    Temp:      TempSrc:      SpO2: 95% 95% 95%   Weight:      Height:      PainSc:    9  Isolation Precautions No active isolations  Medications Medications  0.45 % sodium chloride infusion (  Intravenous New Bag/Given (Non-Interop) 11/19/18 0021)  HYDROmorphone (DILAUDID) injection 1 mg (has no administration in time range)    Or  HYDROmorphone (DILAUDID) injection 1 mg (has no administration in time range)  ketorolac (TORADOL) 30 MG/ML injection 30 mg (30 mg Intravenous Given 11/19/18 0017)  HYDROmorphone (DILAUDID) injection 0.5 mg (0.5 mg Intravenous Given 11/19/18 0015)    Or  HYDROmorphone (DILAUDID) injection 0.5 mg ( Subcutaneous See Alternative 11/19/18 0015)  HYDROmorphone (DILAUDID) injection 1 mg (1 mg Intravenous Given 11/19/18 0059)    Or  HYDROmorphone (DILAUDID) injection 1 mg ( Subcutaneous See Alternative 11/19/18 0059)  HYDROmorphone (DILAUDID) injection 1 mg (1 mg Intravenous Given 11/19/18 0134)    Or  HYDROmorphone (DILAUDID) injection 1 mg ( Subcutaneous See Alternative 11/19/18 0134)  0.9 %  sodium chloride infusion (10 mL/hr Intravenous New Bag/Given 11/19/18 0243)    Mobility walks

## 2018-11-19 NOTE — H&P (Signed)
History and Physical    Jerry Cokearrance K Bivens ZOX:[id number] DOB: 11/04/1986 DOA: 11/18/2018  PCP: Quentin AngstJegede, Olugbemiga E, MD  Patient coming from: Home.  Chief Complaint: Pain.  HPI: Jerry [name] is a 32 y.o. male with history of sickle cell anemia who was discharged yesterday went home and started having worsening pain in the left side of the body mostly in the left flank chest and lower extremity.  Patient was instructed to come to the ER.  Denies any shortness of breath productive cough but had some mild fever.  Denies any headache or visual symptoms.  During last admission patient had right facial numbness which persisted at the time MRI was negative.  ED Course: In the ER patient is mildly febrile with temperature of 100.2 leukocytosis UA unremarkable chest x-ray shows possible infiltrates.  Hemoglobin has dropped slowly from 8.43 days ago and now is around 6.6.  2 units of PRBC transfusion was ordered by the ER physician.  Patient admitted for sickle cell pain crisis.  Review of Systems: As per HPI, rest all negative.   Past Medical History:  Diagnosis Date  . Sickle cell anemia (HCC)     History reviewed. No pertinent surgical history.   reports that he has never smoked. He has never used smokeless tobacco. He reports current drug use. Drug: Marijuana. He reports that he does not drink alcohol.  No Known Allergies  History reviewed. No pertinent family history.  Prior to Admission medications   Medication Sig Start Date End Date Taking? Authorizing Provider  ibuprofen (ADVIL,MOTRIN) 800 MG tablet Take 1 tablet (800 mg total) by mouth 3 (three) times daily. 11/14/18  Yes Roxy HorsemanBrowning, Robert, PA-C  Oxycodone HCl 10 MG TABS Take 1 tablet (10 mg total) by mouth every 6 (six) hours as needed for up to 15 days. 11/18/18 12/03/18 Yes Mike Gipouglas, Andre, FNP  folic acid (FOLVITE) 1 MG tablet Take 1 tablet (1 mg total) by mouth daily. Patient not taking: Reported on 10/17/2018 07/10/18    Mike Gipouglas, Andre, FNP    Physical Exam: Vitals:   11/19/18 0230 11/19/18 0303 11/19/18 0344 11/19/18 0415  BP: 134/74 (!) 141/75 (!) 147/78 (!) 142/80  Pulse: (!) 104 (!) 101 97 96  Resp: 18 17 17 16   Temp:  98.8 F (37.1 C) 98.6 F (37 C) 99 F (37.2 C)  TempSrc:  Oral Oral Oral  SpO2: 95% 95% 97% 96%  Weight:      Height:          Constitutional: Moderately built and nourished. Vitals:   11/19/18 0230 11/19/18 0303 11/19/18 0344 11/19/18 0415  BP: 134/74 (!) 141/75 (!) 147/78 (!) 142/80  Pulse: (!) 104 (!) 101 97 96  Resp: 18 17 17 16   Temp:  98.8 F (37.1 C) 98.6 F (37 C) 99 F (37.2 C)  TempSrc:  Oral Oral Oral  SpO2: 95% 95% 97% 96%  Weight:      Height:       Eyes: Anicteric no pallor. ENMT: No discharge from the ears eyes nose or mouth. Neck: No mass felt.  No neck rigidity no JVD appreciated. Respiratory: No rhonchi or crepitations. Cardiovascular: S1-S2 heard. Abdomen: Soft nontender bowel sounds present. Musculoskeletal: No edema but has difficulty moving the left lower extremity due to pain. Skin: No rash. Neurologic: Alert awake oriented to time place and person.  Moves all extremities. Psychiatric: Appears normal per normal affect.   Labs on Admission: I have personally reviewed following labs and imaging  studies  CBC: Recent Labs  Lab 11/14/18 0104 11/14/18 1258 11/15/18 0313 11/16/18 0358 11/17/18 0758 11/19/18 0015  WBC 15.2* 20.6* 15.2* 17.4* 17.8* 20.6*  NEUTROABS 8.9* 15.0* 11.3*  --  14.1* 16.4*  HGB 8.4* 9.3* 8.1* 8.4* 7.6* 6.6*  HCT 24.8* 27.0* 24.1* 24.8* 22.7* 20.0*  MCV 89.9 87.1 90.6 88.6 87.0 86.2  PLT 355 355 236 219 250 308   Basic Metabolic Panel: Recent Labs  Lab 11/14/18 1258 11/15/18 0313 11/16/18 0358 11/17/18 0758 11/19/18 0015  NA 137 138 134* 139 138  K 3.4* 3.8 3.7 3.9 3.8  CL 102 103 100 101 102  CO2 25 25 25 28 27   GLUCOSE 103* 117* 101* 99 114*  BUN 9 10 11 15 15   CREATININE 0.91 0.74 0.83 0.81  0.84  CALCIUM 9.5 9.1 8.8* 9.0 9.3   GFR: Estimated Creatinine Clearance: 137 mL/min (by C-G formula based on SCr of 0.84 mg/dL). Liver Function Tests: Recent Labs  Lab 11/14/18 0104 11/14/18 1258 11/17/18 0758 11/19/18 0015  AST 25 48* 21 32  ALT 15 18 16  32  ALKPHOS 104 114 98 97  BILITOT 2.1* 2.3* 2.4* 2.2*  PROT 7.3 7.7 7.7 8.3*  ALBUMIN 4.0 4.7 4.0 4.4   No results for input(s): LIPASE, AMYLASE in the last 168 hours. No results for input(s): AMMONIA in the last 168 hours. Coagulation Profile: No results for input(s): INR, PROTIME in the last 168 hours. Cardiac Enzymes: No results for input(s): CKTOTAL, CKMB, CKMBINDEX, TROPONINI in the last 168 hours. BNP (last 3 results) No results for input(s): PROBNP in the last 8760 hours. HbA1C: No results for input(s): HGBA1C in the last 72 hours. CBG: No results for input(s): GLUCAP in the last 168 hours. Lipid Profile: No results for input(s): CHOL, HDL, LDLCALC, TRIG, CHOLHDL, LDLDIRECT in the last 72 hours. Thyroid Function Tests: No results for input(s): TSH, T4TOTAL, FREET4, T3FREE, THYROIDAB in the last 72 hours. Anemia Panel: Recent Labs    11/19/18 0015  RETICCTPCT 13.2*   Urine analysis:    Component Value Date/Time   COLORURINE YELLOW 11/19/2018 0351   APPEARANCEUR CLEAR 11/19/2018 0351   LABSPEC 1.011 11/19/2018 0351   PHURINE 6.0 11/19/2018 0351   GLUCOSEU NEGATIVE 11/19/2018 0351   HGBUR NEGATIVE 11/19/2018 0351   BILIRUBINUR NEGATIVE 11/19/2018 0351   BILIRUBINUR neg 10/17/2018 1155   KETONESUR NEGATIVE 11/19/2018 0351   PROTEINUR 100 (A) 11/19/2018 0351   UROBILINOGEN 0.2 10/17/2018 1155   UROBILINOGEN 2.0 (H) 04/26/2017 0942   NITRITE NEGATIVE 11/19/2018 0351   LEUKOCYTESUR NEGATIVE 11/19/2018 0351   Sepsis Labs: @LABRCNTIP (procalcitonin:4,lacticidven:4) )No results found for this or any previous visit (from the past 240 hour(s)).   Radiological Exams on Admission: Dg Chest 2 View  Result  Date: 11/19/2018 CLINICAL DATA:  Acute onset of cough, fever and body aches. EXAM: CHEST - 2 VIEW COMPARISON:  Chest radiograph performed 11/14/2018 FINDINGS: The lungs are well-aerated. Mild right midlung and persistent mild bibasilar airspace opacities may reflect atelectasis or possibly mild pneumonia, given the patient's symptoms. There is no evidence of pleural effusion or pneumothorax. The heart is normal in size; the mediastinal contour is within normal limits. No acute osseous abnormalities are seen. IMPRESSION: Mild right midlung and persistent mild bibasilar airspace opacities may reflect atelectasis or possibly mild pneumonia, given the patient's symptoms. Electronically Signed   By: Roanna RaiderJeffery  Chang M.D.   On: 11/19/2018 02:15     Assessment/Plan Principal Problem:   Sickle cell pain crisis (HCC)  Active Problems:   Sickle cell anemia (HCC)    1. Sickle cell pain crisis -patient is placed on Dilaudid PCA.  2 units of PRBC transfusion has been ordered.  No definite signs of acute chest syndrome since patient not hypoxic but mildly febrile with chest x-ray showing infiltrates tachycardia patient on Levaquin.  Will get blood cultures.  Check influenza PCR. 2. Right facial numbness has been persistent since last admission last week.  MRI brain done at that time did not show anything acute.  Discussed with neurologist who advised nothing further required at this time. 3. Sickle cell anemia follow CBC.   DVT prophylaxis: Lovenox. Code Status: Full code. Family Communication: Discussed with patient. Disposition Plan: Home. Consults called: Discussed with neurologist. Admission status: Inpatient.   Eduard Clos MD [facility] Pager [phone].  If 7PM-7AM, please contact night-coverage [email or web address] Password [facility]  11/19/2018, 4:54 AM

## 2018-11-20 LAB — RESPIRATORY PANEL BY PCR
Adenovirus: NOT DETECTED
Bordetella pertussis: NOT DETECTED
Chlamydophila pneumoniae: NOT DETECTED
Coronavirus 229E: NOT DETECTED
Coronavirus HKU1: NOT DETECTED
Coronavirus NL63: NOT DETECTED
Coronavirus OC43: NOT DETECTED
INFLUENZA A-RVPPCR: NOT DETECTED
Influenza B: NOT DETECTED
Metapneumovirus: NOT DETECTED
Mycoplasma pneumoniae: NOT DETECTED
Parainfluenza Virus 1: NOT DETECTED
Parainfluenza Virus 2: NOT DETECTED
Parainfluenza Virus 3: NOT DETECTED
Parainfluenza Virus 4: NOT DETECTED
Respiratory Syncytial Virus: NOT DETECTED
Rhinovirus / Enterovirus: NOT DETECTED

## 2018-11-20 LAB — CBC
HCT: 22.4 % — ABNORMAL LOW (ref 39.0–52.0)
Hemoglobin: 7 g/dL — ABNORMAL LOW (ref 13.0–17.0)
MCH: 28.1 pg (ref 26.0–34.0)
MCHC: 31.3 g/dL (ref 30.0–36.0)
MCV: 90 fL (ref 80.0–100.0)
PLATELETS: 323 10*3/uL (ref 150–400)
RBC: 2.49 MIL/uL — ABNORMAL LOW (ref 4.22–5.81)
RDW: 26.2 % — ABNORMAL HIGH (ref 11.5–15.5)
WBC: 12.8 10*3/uL — ABNORMAL HIGH (ref 4.0–10.5)
nRBC: 3.6 % — ABNORMAL HIGH (ref 0.0–0.2)

## 2018-11-20 MED ORDER — ENOXAPARIN SODIUM 40 MG/0.4ML ~~LOC~~ SOLN
40.0000 mg | SUBCUTANEOUS | Status: DC
  Administered 2018-11-21 – 2018-11-22 (×2): 40 mg via SUBCUTANEOUS
  Filled 2018-11-20 (×2): qty 0.4

## 2018-11-20 MED ORDER — SODIUM CHLORIDE 0.45 % IV SOLN
INTRAVENOUS | Status: AC
  Administered 2018-11-21 (×2): via INTRAVENOUS

## 2018-11-20 NOTE — Progress Notes (Signed)
Subjective: Jerry [name], 32 year old male that was admitted this a.m. and sickle cell crisis and  acute chest syndrome.  Patient was discharged from inpatient services on 11/16/2018.  He states he requested to be discharged at that time due to important family constraints.  He states that pain intensity increased 45 minutes after he was discharged.  He endorses worsening pain primarily to left chest and left lower extremity.  Patient notified primary provider and was instructed to report to the emergency department.  Continues to have pain.  He denies headache, dizziness, blurred vision, dysuria, nausea, vomiting, or diarrhea.   Objective:  Vital signs in last 24 hours:  Vitals:   11/20/18 0750 11/20/18 0929 11/20/18 1215 11/20/18 1410  BP:  (!) 139/93  (!) 155/95  Pulse:  88  98  Resp: 19 17 17 11   Temp:  98.4 F (36.9 C)  97.7 F (36.5 C)  TempSrc:  Oral  Oral  SpO2: 97% 95% 95% 96%  Weight:      Height:        Intake/Output from previous day:   Intake/Output Summary (Last 24 hours) at 11/20/2018 1534 Last data filed at 11/20/2018 1300 Gross per 24 hour  Intake 2516.84 ml  Output 650 ml  Net 1866.84 ml    Physical Exam: General: Alert, awake, oriented x3, in no acute distress.  HEENT: [place]/AT PEERL, EOMI Neck: Trachea midline,  no masses, no thyromegal,y no JVD, no carotid bruit OROPHARYNX:  Moist, No exudate/ erythema/lesions.  Heart: Regular rate and rhythm, without murmurs, rubs, gallops, PMI non-displaced, no heaves or thrills on palpation.  Lungs: Clear to auscultation, no wheezing or rhonchi noted. No increased vocal fremitus resonant to percussion  Abdomen: Soft, nontender, nondistended, positive bowel sounds, no masses no hepatosplenomegaly noted..  Neuro: No focal neurological deficits noted cranial nerves II through XII grossly intact. DTRs 2+ bilaterally upper and lower extremities. Strength 5 out of 5 in bilateral upper and lower  extremities. Musculoskeletal: No warm swelling or erythema around joints, no spinal tenderness noted. Psychiatric: Patient alert and oriented x3, good insight and cognition, good recent to remote recall. Lymph node survey: No cervical axillary or inguinal lymphadenopathy noted.  Lab Results:  Basic Metabolic Panel:    Component Value Date/Time   NA 139 11/19/2018 1400   NA 144 10/17/2018 1203   K 3.6 11/19/2018 1400   CL 103 11/19/2018 1400   CO2 25 11/19/2018 1400   BUN 13 11/19/2018 1400   BUN 10 10/17/2018 1203   CREATININE 0.93 11/19/2018 1400   CREATININE 0.84 04/26/2017 0948   GLUCOSE 119 (H) 11/19/2018 1400   CALCIUM 8.8 (L) 11/19/2018 1400   CBC:    Component Value Date/Time   WBC 12.8 (H) 11/20/2018 0517   HGB 7.0 (L) 11/20/2018 0517   HGB 7.1 (L) 10/17/2018 1203   HCT 22.4 (L) 11/20/2018 0517   HCT 22.8 (L) 10/17/2018 1203   PLT 323 11/20/2018 0517   PLT 519 (H) 10/17/2018 1203   MCV 90.0 11/20/2018 0517   MCV 91 10/17/2018 1203   NEUTROABS 9.9 (H) 11/19/2018 1014   NEUTROABS 6.2 10/17/2018 1203   LYMPHSABS 3.0 11/19/2018 1014   LYMPHSABS 1.8 10/17/2018 1203   MONOABS 2.0 (H) 11/19/2018 1014   EOSABS 0.5 11/19/2018 1014   EOSABS 0.0 10/17/2018 1203   BASOSABS 0.1 11/19/2018 1014   BASOSABS 0.0 10/17/2018 1203    Recent Results (from the past 240 hour(s))  Culture, blood (routine x 2)  Status: None (Preliminary result)   Collection Time: 11/19/18 10:14 AM  Result Value Ref Range Status   Specimen Description   Final    BLOOD LEFT ARM Performed at [facility], [address] [place], [place] [address]    Special Requests   Final    BOTTLES DRAWN AEROBIC AND ANAEROBIC Blood Culture adequate volume Performed at [facility], [address] [place], [place] [address]    Culture   Final    NO GROWTH 1 DAY Performed at [facility], [address] [place], [place] [address]    Report Status PENDING   Incomplete  Culture, blood (routine x 2)     Status: None (Preliminary result)   Collection Time: 11/19/18 10:14 AM  Result Value Ref Range Status   Specimen Description   Final    BLOOD LEFT HAND Performed at [facility], [address] [place], [place] [address]    Special Requests   Final    BOTTLES DRAWN AEROBIC AND ANAEROBIC Blood Culture adequate volume Performed at [facility], [address] [place], [place] [address]    Culture   Final    NO GROWTH 1 DAY Performed at [facility], [address] [place], [place] [address]    Report Status PENDING  Incomplete  Respiratory Panel by PCR     Status: None   Collection Time: 11/19/18  8:20 PM  Result Value Ref Range Status   Adenovirus NOT DETECTED NOT DETECTED Final   Coronavirus 229E NOT DETECTED NOT DETECTED Final   Coronavirus HKU1 NOT DETECTED NOT DETECTED Final   Coronavirus NL63 NOT DETECTED NOT DETECTED Final   Coronavirus OC43 NOT DETECTED NOT DETECTED Final   Metapneumovirus NOT DETECTED NOT DETECTED Final   Rhinovirus / Enterovirus NOT DETECTED NOT DETECTED Final   Influenza A NOT DETECTED NOT DETECTED Final   Influenza B NOT DETECTED NOT DETECTED Final   Parainfluenza Virus 1 NOT DETECTED NOT DETECTED Final   Parainfluenza Virus 2 NOT DETECTED NOT DETECTED Final   Parainfluenza Virus 3 NOT DETECTED NOT DETECTED Final   Parainfluenza Virus 4 NOT DETECTED NOT DETECTED Final   Respiratory Syncytial Virus NOT DETECTED NOT DETECTED Final   Bordetella pertussis NOT DETECTED NOT DETECTED Final   Chlamydophila pneumoniae NOT DETECTED NOT DETECTED Final   Mycoplasma pneumoniae NOT DETECTED NOT DETECTED Final    Comment: Performed at [facility], [address] [place], [place] [address]    Studies/Results: Dg Chest 2 View  Result Date: 11/19/2018 CLINICAL DATA:  Acute onset of cough, fever and body aches. EXAM: CHEST - 2 VIEW COMPARISON:  Chest radiograph  performed 11/14/2018 FINDINGS: The lungs are well-aerated. Mild right midlung and persistent mild bibasilar airspace opacities may reflect atelectasis or possibly mild pneumonia, given the patient's symptoms. There is no evidence of pleural effusion or pneumothorax. The heart is normal in size; the mediastinal contour is within normal limits. No acute osseous abnormalities are seen. IMPRESSION: Mild right midlung and persistent mild bibasilar airspace opacities may reflect atelectasis or possibly mild pneumonia, given the patient's symptoms. Electronically Signed   By: Roanna RaiderJeffery  Chang M.D.   On: 11/19/2018 02:15    Medications: Scheduled Meds: . enoxaparin (LOVENOX) injection  40 mg Subcutaneous Daily  . HYDROmorphone   Intravenous Q4H  . senna-docusate  1 tablet Oral BID   Continuous Infusions: . levofloxacin (LEVAQUIN) IV 750 mg (11/20/18 0800)   PRN  Meds:.diphenhydrAMINE, naloxone **AND** sodium chloride flush, ondansetron (ZOFRAN) IV, oxyCODONE, polyethylene glycol    Procedures:  2 units PRBCs  Antibiotics:  Levoquin per pharmacy consult  Assessment/Plan: Principal Problem:   Sickle cell pain crisis (HCC) Active Problems:   Sickle cell anemia (HCC)  Sickle cell pain crisis:  Dilaudid PCA initiated with settings 0.5 mg hours, 10-minute lockout, 3 mg/h. Patient is s/p 2 units of PRBCs.  Hemoglobin has improved to 7.0.  Maintain oxygen saturation above 90% IV Toradol 30 mg every 6 hours Monitor vital signs very closely Reevaluate pain scale consistently  Leukocytosis: WBCs have improved to 15.5.  Patient afebrile.  Blood cultures negative.  Continue to follow CBC  Anemia of chronic disease: Hemoglobin 7.0, which is below baseline.  Repeat CBC in a.m. Continue folic acid 1 mg Restart hydroxyurea  Chronic pain syndrome: Oxycodone 10 mg every 4 hours as needed for moderate to severe breakthrough pain  Acute chest syndrome: Mild acute chest syndrome.  Levaquin initiated  on admission  Oxygen saturation above 90% on RA Incentive spirometry 0.45% saline at 75 mL/h    Code Status: Full Code Family Communication: N/A Disposition Plan: Not yet ready for discharge  Author:  Lynden Oxford, MD [facility] Pager: [phone] 11/20/2018 3:35 PM     If 5PM-7AM, please contact night-coverage.  11/20/2018, 3:34 PM  LOS: 1 day

## 2018-11-21 LAB — BASIC METABOLIC PANEL
Anion gap: 12 (ref 5–15)
BUN: 12 mg/dL (ref 6–20)
CO2: 25 mmol/L (ref 22–32)
Calcium: 9.3 mg/dL (ref 8.9–10.3)
Chloride: 99 mmol/L (ref 98–111)
Creatinine, Ser: 0.86 mg/dL (ref 0.61–1.24)
GFR calc Af Amer: >60 mL/min (ref >60)
GFR calc non Af Amer: >60 mL/min (ref >60)
Glucose, Bld: 103 mg/dL — ABNORMAL HIGH (ref 70–99)
POTASSIUM: 4.2 mmol/L (ref 3.5–5.1)
Sodium: 136 mmol/L (ref 135–145)

## 2018-11-21 LAB — RETICULOCYTES
Immature Retic Fract: 49.1 % — ABNORMAL HIGH (ref 2.3–15.9)
RBC.: 2.66 MIL/uL — ABNORMAL LOW (ref 4.22–5.81)
Retic Count, Absolute: 347.6 10*3/uL — ABNORMAL HIGH (ref 19.0–186.0)
Retic Ct Pct: 13.1 % — ABNORMAL HIGH (ref 0.4–3.1)

## 2018-11-21 LAB — CBC WITH DIFFERENTIAL/PLATELET
Abs Immature Granulocytes: 0.16 10*3/uL — ABNORMAL HIGH (ref 0.00–0.07)
Basophils Absolute: 0 10*3/uL (ref 0.0–0.1)
Basophils Relative: 0 %
EOS ABS: 0.8 10*3/uL — AB (ref 0.0–0.5)
Eosinophils Relative: 4 %
HCT: 23.6 % — ABNORMAL LOW (ref 39.0–52.0)
Hemoglobin: 7.6 g/dL — ABNORMAL LOW (ref 13.0–17.0)
Immature Granulocytes: 1 %
Lymphocytes Relative: 15 %
Lymphs Abs: 2.6 10*3/uL (ref 0.7–4.0)
MCH: 28.6 pg (ref 26.0–34.0)
MCHC: 32.2 g/dL (ref 30.0–36.0)
MCV: 88.7 fL (ref 80.0–100.0)
MONOS PCT: 13 %
Monocytes Absolute: 2.2 10*3/uL — ABNORMAL HIGH (ref 0.1–1.0)
Neutro Abs: 10.8 10*3/uL — ABNORMAL HIGH (ref 1.7–7.7)
Neutrophils Relative %: 67 %
Platelets: 404 10*3/uL — ABNORMAL HIGH (ref 150–400)
RBC: 2.66 MIL/uL — ABNORMAL LOW (ref 4.22–5.81)
RDW: 26.5 % — ABNORMAL HIGH (ref 11.5–15.5)
WBC: 16.4 10*3/uL — ABNORMAL HIGH (ref 4.0–10.5)
nRBC: 2.9 % — ABNORMAL HIGH (ref 0.0–0.2)

## 2018-11-21 LAB — LACTATE DEHYDROGENASE: LDH: 401 U/L — ABNORMAL HIGH (ref 98–192)

## 2018-11-21 NOTE — Progress Notes (Signed)
Subjective: Jerry [name], 32 year old male that was admitted this a.m. and sickle cell crisis and  acute chest syndrome.  Patient was discharged from inpatient services on 11/16/2018.  He states he requested to be discharged at that time due to important family constraints.  He states that pain intensity increased 45 minutes after he was discharged.  He endorses worsening pain primarily to left chest and left lower extremity.  Patient notified primary provider and was instructed to report to the emergency department.  Continues to have pain.  Also improving.  No blood in the stool. He denies headache, dizziness, blurred vision, dysuria, nausea, vomiting, or diarrhea.   Objective:  Vital signs in last 24 hours:  Vitals:   11/21/18 0907 11/21/18 1315 11/21/18 1545 11/21/18 1820  BP: 133/83 124/82  139/89  Pulse: (!) 106 (!) 111  (!) 106  Resp: 11 15 14 17   Temp: 98.7 F (37.1 C) 98.8 F (37.1 C)  98.8 F (37.1 C)  TempSrc: Oral Oral  Oral  SpO2: 98% 98%  97%  Weight:      Height:        Intake/Output from previous day:   Intake/Output Summary (Last 24 hours) at 11/21/2018 1914 Last data filed at 11/21/2018 1627 Gross per 24 hour  Intake 1681.17 ml  Output 1800 ml  Net -118.83 ml    Physical Exam: General: Alert, awake, oriented x3, in no acute distress.  HEENT: [place]/AT PEERL, EOMI Neck: Trachea midline,  no masses, no thyromegal,y no JVD, no carotid bruit OROPHARYNX:  Moist, No exudate/ erythema/lesions.  Heart: Regular rate and rhythm, without murmurs, rubs, gallops, PMI non-displaced, no heaves or thrills on palpation.  Lungs: Clear to auscultation, no wheezing or rhonchi noted. No increased vocal fremitus resonant to percussion  Abdomen: Soft, nontender, nondistended, positive bowel sounds, no masses no hepatosplenomegaly noted..  Neuro: No focal neurological deficits noted cranial nerves II through XII grossly intact. DTRs 2+ bilaterally upper and lower extremities.  Strength 5 out of 5 in bilateral upper and lower extremities. Musculoskeletal: No warm swelling or erythema around joints, no spinal tenderness noted. Psychiatric: Patient alert and oriented x3, good insight and cognition, good recent to remote recall. Lymph node survey: No cervical axillary or inguinal lymphadenopathy noted.  Lab Results:  Basic Metabolic Panel:    Component Value Date/Time   NA 136 11/21/2018 0332   NA 144 10/17/2018 1203   K 4.2 11/21/2018 0332   CL 99 11/21/2018 0332   CO2 25 11/21/2018 0332   BUN 12 11/21/2018 0332   BUN 10 10/17/2018 1203   CREATININE 0.86 11/21/2018 0332   CREATININE 0.84 04/26/2017 0948   GLUCOSE 103 (H) 11/21/2018 0332   CALCIUM 9.3 11/21/2018 0332   CBC:    Component Value Date/Time   WBC 16.4 (H) 11/21/2018 0332   HGB 7.6 (L) 11/21/2018 0332   HGB 7.1 (L) 10/17/2018 1203   HCT 23.6 (L) 11/21/2018 0332   HCT 22.8 (L) 10/17/2018 1203   PLT 404 (H) 11/21/2018 0332   PLT 519 (H) 10/17/2018 1203   MCV 88.7 11/21/2018 0332   MCV 91 10/17/2018 1203   NEUTROABS 10.8 (H) 11/21/2018 0332   NEUTROABS 6.2 10/17/2018 1203   LYMPHSABS 2.6 11/21/2018 0332   LYMPHSABS 1.8 10/17/2018 1203   MONOABS 2.2 (H) 11/21/2018 0332   EOSABS 0.8 (H) 11/21/2018 0332   EOSABS 0.0 10/17/2018 1203   BASOSABS 0.0 11/21/2018 0332   BASOSABS 0.0 10/17/2018 1203    Recent Results (from the  past 240 hour(s))  Culture, blood (routine x 2)     Status: None (Preliminary result)   Collection Time: 11/19/18 10:14 AM  Result Value Ref Range Status   Specimen Description   Final    BLOOD LEFT ARM Performed at [facility], [address], [place], [place] [address]    Special Requests   Final    BOTTLES DRAWN AEROBIC AND ANAEROBIC Blood Culture adequate volume Performed at [facility], [address], [place], [place] [address]    Culture   Final    NO GROWTH 2 DAYS Performed at [facility], [address] [place], [place] [address]    Report Status PENDING  Incomplete  Culture, blood (routine x 2)     Status: None (Preliminary result)   Collection Time: 11/19/18 10:14 AM  Result Value Ref Range Status   Specimen Description   Final    BLOOD LEFT HAND Performed at [facility], [address], [place], [place] [address]    Special Requests   Final    BOTTLES DRAWN AEROBIC AND ANAEROBIC Blood Culture adequate volume Performed at [facility], [address], [place], [place] [address]    Culture   Final    NO GROWTH 2 DAYS Performed at [facility], [address] [place], [place] [address]    Report Status PENDING  Incomplete  Respiratory Panel by PCR     Status: None   Collection Time: 11/19/18  8:20 PM  Result Value Ref Range Status   Adenovirus NOT DETECTED NOT DETECTED Final   Coronavirus 229E NOT DETECTED NOT DETECTED Final   Coronavirus HKU1 NOT DETECTED NOT DETECTED Final   Coronavirus NL63 NOT DETECTED NOT DETECTED Final   Coronavirus OC43 NOT DETECTED NOT DETECTED Final   Metapneumovirus NOT DETECTED NOT DETECTED Final   Rhinovirus / Enterovirus NOT DETECTED NOT DETECTED Final   Influenza A NOT DETECTED NOT DETECTED Final   Influenza B NOT DETECTED NOT DETECTED Final   Parainfluenza Virus 1 NOT DETECTED NOT DETECTED Final   Parainfluenza Virus 2 NOT DETECTED NOT DETECTED Final   Parainfluenza Virus 3 NOT DETECTED NOT DETECTED Final   Parainfluenza Virus 4 NOT DETECTED NOT DETECTED Final   Respiratory Syncytial Virus NOT DETECTED NOT DETECTED Final   Bordetella pertussis NOT DETECTED NOT DETECTED Final   Chlamydophila pneumoniae NOT DETECTED NOT DETECTED Final   Mycoplasma pneumoniae NOT DETECTED NOT DETECTED Final    Comment: Performed at [facility], [address] [place], [place] [address]    Studies/Results: No results found.  Medications: Scheduled Meds: . enoxaparin (LOVENOX) injection  40 mg Subcutaneous  Q24H  . HYDROmorphone   Intravenous Q4H  . senna-docusate  1 tablet Oral BID   Continuous Infusions: . sodium chloride 75 mL/hr at 11/21/18 1712  . levofloxacin (LEVAQUIN) IV 75 mL/hr at 11/21/18 0953   PRN Meds:.diphenhydrAMINE, naloxone **AND** sodium chloride flush, ondansetron (ZOFRAN) IV, oxyCODONE, polyethylene glycol    Procedures:  2 units PRBCs  Antibiotics:  Levoquin per pharmacy consult  Assessment/Plan: Principal Problem:   Sickle cell pain crisis (HCC) Active Problems:   Sickle cell anemia (HCC)  Sickle cell pain crisis:  Dilaudid PCA initiated with settings 0.5 mg hours, 10-minute lockout, 3 mg/h. Patient is s/p 2 units of PRBCs.  Hemoglobin has improved to 7.0.  Maintain oxygen saturation above 90% IV Toradol 30 mg every 6 hours Monitor vital signs very  closely Reevaluate pain scale consistently Likely can transition to home regimen tomorrow.  Leukocytosis: WBCs have improved to 15.5.  Patient afebrile.  Blood cultures negative.  Continue to follow CBC  Anemia of chronic disease: Hemoglobin 7.0, which is below baseline.  Repeat CBC in a.m. Continue folic acid 1 mg Restart hydroxyurea  Chronic pain syndrome: Oxycodone 10 mg every 4 hours as needed for moderate to severe breakthrough pain  Acute chest syndrome: Mild acute chest syndrome.  Levaquin initiated on admission  Oxygen saturation above 90% on RA Incentive spirometry 0.45% saline at 75 mL/h Likely can stop it tomorrow.    Code Status: Full Code Family Communication: N/A Disposition Plan: Not yet ready for discharge  Author:  Lynden OxfordPranav Olanna Percifield, MD [facility] Pager: [phone] 11/21/2018 7:14 PM     If 5PM-7AM, please contact night-coverage.  11/21/2018, 7:14 PM  LOS: 2 days

## 2018-11-21 NOTE — Progress Notes (Signed)
CRITICAL VALUE ALERT  Critical Value:  Hgb 7.6  Date & Time Notied:  11/21/18 0507  Provider Notified: Schorr, TRH  Orders Received/Actions taken: No orders at this time

## 2018-11-22 LAB — CBC WITH DIFFERENTIAL/PLATELET
Abs Immature Granulocytes: 0.17 10*3/uL — ABNORMAL HIGH (ref 0.00–0.07)
Basophils Absolute: 0.1 10*3/uL (ref 0.0–0.1)
Basophils Relative: 0 %
Eosinophils Absolute: 0.9 10*3/uL — ABNORMAL HIGH (ref 0.0–0.5)
Eosinophils Relative: 7 %
HCT: 24.9 % — ABNORMAL LOW (ref 39.0–52.0)
Hemoglobin: 8 g/dL — ABNORMAL LOW (ref 13.0–17.0)
IMMATURE GRANULOCYTES: 1 %
Lymphocytes Relative: 21 %
Lymphs Abs: 2.9 10*3/uL (ref 0.7–4.0)
MCH: 28.5 pg (ref 26.0–34.0)
MCHC: 32.1 g/dL (ref 30.0–36.0)
MCV: 88.6 fL (ref 80.0–100.0)
Monocytes Absolute: 1.9 10*3/uL — ABNORMAL HIGH (ref 0.1–1.0)
Monocytes Relative: 14 %
NEUTROS PCT: 57 %
Neutro Abs: 7.6 10*3/uL (ref 1.7–7.7)
PLATELETS: 535 10*3/uL — AB (ref 150–400)
RBC: 2.81 MIL/uL — ABNORMAL LOW (ref 4.22–5.81)
RDW: 25.4 % — AB (ref 11.5–15.5)
WBC: 13.5 10*3/uL — ABNORMAL HIGH (ref 4.0–10.5)
nRBC: 2.6 % — ABNORMAL HIGH (ref 0.0–0.2)

## 2018-11-22 LAB — BASIC METABOLIC PANEL
Anion gap: 13 (ref 5–15)
BUN: 14 mg/dL (ref 6–20)
CO2: 25 mmol/L (ref 22–32)
Calcium: 9.2 mg/dL (ref 8.9–10.3)
Chloride: 100 mmol/L (ref 98–111)
Creatinine, Ser: 0.78 mg/dL (ref 0.61–1.24)
GFR calc Af Amer: >60 mL/min (ref >60)
GFR calc non Af Amer: >60 mL/min (ref >60)
Glucose, Bld: 105 mg/dL — ABNORMAL HIGH (ref 70–99)
POTASSIUM: 4.6 mmol/L (ref 3.5–5.1)
Sodium: 138 mmol/L (ref 135–145)

## 2018-11-22 LAB — RETICULOCYTES
Immature Retic Fract: 42.6 % — ABNORMAL HIGH (ref 2.3–15.9)
RBC.: 2.81 MIL/uL — ABNORMAL LOW (ref 4.22–5.81)
Retic Count, Absolute: 176.5 10*3/uL (ref 19.0–186.0)
Retic Ct Pct: 12.3 % — ABNORMAL HIGH (ref 0.4–3.1)

## 2018-11-22 LAB — LACTATE DEHYDROGENASE: LDH: 405 U/L — ABNORMAL HIGH (ref 98–192)

## 2018-11-22 MED ORDER — POLYETHYLENE GLYCOL 3350 17 G PO PACK: 17.0000 g | PACK | Freq: Every day | ORAL | 0 refills | Status: DC | PRN

## 2018-11-22 MED ORDER — SODIUM CHLORIDE 0.45 % IV SOLN
INTRAVENOUS | Status: DC
  Administered 2018-11-22: 07:00:00 via INTRAVENOUS

## 2018-11-22 MED ORDER — HYDROMORPHONE 1 MG/ML IV SOLN: INTRAVENOUS | Status: DC

## 2018-11-22 MED ORDER — LEVOFLOXACIN 750 MG PO TABS: 750.0000 mg | ORAL_TABLET | Freq: Every day | ORAL | 0 refills | Status: AC

## 2018-11-22 MED ORDER — LEVOFLOXACIN 750 MG PO TABS: 750.0000 mg | ORAL_TABLET | Freq: Every day | ORAL | Status: DC

## 2018-11-22 NOTE — Progress Notes (Addendum)
Pharmacy Antibiotic Note  Jerry [name] is a 32 y.o. male admitted on 11/18/2018 with sickle cell crisis.  Started Levaquin for acute chest syndrome/PNA.  - 12/24 CXR: Mild right midlung and persistent mild bibasilar airspace opacities may reflect atelectasis or possibly mild pneumonia  11/22/2018:  Day#4 abx  Afebrile  Renal fxn stable at patient's baseline  WBC elevated, but trending down  Plan: - Change Levaquin to 750mg  PO q24h - No dose adjustments anticipated -pharmacy will sign off.  Please re-consult if needed.  - Noted plan to possibly d/c antibiotics today-->recommended to stop abx as soon as clinically improved or 5-7 days of therapy   _____________________________________  Height: 5\' 11"  (180.3 cm) Weight: 167 lb 8.8 oz (76 kg) IBW/kg (Calculated) : 75.3  Temp (24hrs), Avg:98.8 F (37.1 C), Min:98.4 F (36.9 C), Max:99.6 F (37.6 C)  Recent Labs  Lab 11/19/18 0015 11/19/18 1014 11/19/18 1400 11/20/18 0517 11/21/18 0332 11/22/18 0355  WBC 20.6* 15.5*  --  12.8* 16.4* 13.5*  CREATININE 0.84 0.82 0.93  --  0.86 0.78    Estimated Creatinine Clearance: 143.8 mL/min (by C-G formula based on SCr of 0.78 mg/dL).    No Known Allergies   Thank you for allowing pharmacy to be a part of this patient's care.  Jerry [name], Jerry [name] 11/22/2018 9:41 AM

## 2018-11-22 NOTE — Discharge Summary (Signed)
[facility] Discharge Summary   Patient: Jerry [name] JXB:[id number]   PCP: Quentin Angst, MD DOB: 09-18-1986   Date of admission: 11/18/2018   Date of discharge: 11/22/2018    Discharge Diagnoses:   Principal Problem:   Sickle cell pain crisis (HCC) Active Problems:   Sickle cell anemia (HCC)   Admitted From: home Disposition:  home  Recommendations for Outpatient Follow-up:  1. Please follow-up with PCP in 1 week.  Follow-up Information    Quentin Angst, MD. Schedule an appointment as soon as possible for a visit in 1 week(s).   Specialty:  Internal Medicine Contact information: [address] [place] [address] [phone]          Diet recommendation: home  Activity: The patient is advised to gradually reintroduce usual activities.  Discharge Condition: good  Code Status: full code  History of present illness: As per the H and P dictated on admission, "Jerry [name] is a 32 y.o. male with history of sickle cell anemia who was discharged yesterday went home and started having worsening pain in the left side of the body mostly in the left flank chest and lower extremity.  Patient was instructed to come to the ER.  Denies any shortness of breath productive cough but had some mild fever.  Denies any headache or visual symptoms.  During last admission patient had right facial numbness which persisted at the time MRI was negative.  ED Course: In the ER patient is mildly febrile with temperature of 100.2 leukocytosis UA unremarkable chest x-ray shows possible infiltrates.  Hemoglobin has dropped slowly from 8.43 days ago and now is around 6.6.  2 units of PRBC transfusion was ordered by the ER physician.  Patient admitted for sickle cell pain crisis."  Hospital Course:  Summary of his active problems in the hospital is as following. Sickle cell pain crisis:  Dilaudid PCA initiated with settings 0.5 mg hours, 10-minute lockout, 3  mg/h. Patient is s/p 2 units of PRBCs.    Leukocytosis: WBCs have improved.  Patient afebrile.  Blood cultures negative.  Anemia of chronic disease: H&H stable. Continue folic acid 1 mg Restart hydroxyurea  Chronic pain syndrome: Oxycodone 10 mg every 4 hours as needed for moderate to severe breakthrough pain Patient already has pain medication at home per him.  Acute chest syndrome: Mild acute chest syndrome.  Levaquin initiated on admission  Incentive spirometry  All other chronic medical condition were stable during the hospitalization.  Patient was ambulatory without any assistance. On the day of the discharge the patient's vitals were stable , and no other acute medical condition were reported by patient. the patient was felt safe to be discharge at home with family.  Consultants: none Procedures: none  DISCHARGE MEDICATION: Allergies as of 11/22/2018   No Known Allergies     Medication List    TAKE these medications   folic acid 1 MG tablet Commonly known as:  FOLVITE Take 1 tablet (1 mg total) by mouth daily.   ibuprofen 800 MG tablet Commonly known as:  ADVIL,MOTRIN Take 1 tablet (800 mg total) by mouth 3 (three) times daily.   levofloxacin 750 MG tablet Commonly known as:  LEVAQUIN Take 1 tablet (750 mg total) by mouth daily for 2 days. Start taking on:  November 23, 2018   Oxycodone HCl 10 MG Tabs Take 1 tablet (10 mg total) by mouth every 6 (six) hours as needed for up to 15 days.  polyethylene glycol packet Commonly known as:  MIRALAX / GLYCOLAX Take 17 g by mouth daily as needed for mild constipation.      No Known Allergies Discharge Instructions    Diet - low sodium heart healthy   Complete by:  As directed    Discharge instructions   Complete by:  As directed    It is important that you read following instructions as well as go over your medication list with RN to help you understand your care after this hospitalization.  Discharge  Instructions: Please follow-up with PCP in one week  Please request your primary care physician to go over all Hospital Tests and Procedure/Radiological results at the follow up,  Please get all Hospital records sent to your PCP by signing hospital release before you go home.   Do not take more than prescribed Pain, Sleep and Anxiety Medications. You were cared for by a hospitalist during your hospital stay. If you have any questions about your discharge medications or the care you received while you were in the hospital after you are discharged, you can call the unit you were admitted to and ask to speak with the hospitalist on call if the hospitalist that took care of you is not available.  Once you are discharged, your primary care physician will handle any further medical issues. Please note that NO REFILLS for any discharge medications will be authorized once you are discharged, as it is imperative that you return to your primary care physician (or establish a relationship with a primary care physician if you do not have one) for your aftercare needs so that they can reassess your need for medications and monitor your lab values. You Must read complete instructions/literature along with all the possible adverse reactions/side effects for all the Medicines you take and that have been prescribed to you. Take any new Medicines after you have completely understood and accept all the possible adverse reactions/side effects. Wear Seat belts while driving. If you have smoked or chewed Tobacco in the last 2 yrs please stop smoking and/or stop any Recreational drug use.   Increase activity slowly   Complete by:  As directed      Discharge Exam: Filed Weights   11/19/18 0541 11/21/18 0051 11/22/18 0656  Weight: 77.8 kg 79.6 kg 76 kg   Vitals:   11/22/18 0924 11/22/18 0934  BP: 122/72   Pulse: 91   Resp: 10 15  Temp: 99.6 F (37.6 C)   SpO2: 100% 94%   General: Appear in no distress, no  Rash; Oral Mucosa moist. Cardiovascular: S1 and S2 Present, no Murmur, no JVD Respiratory: Bilateral Air entry present and Clear to Auscultation, no Crackles, no wheezes Abdomen: Bowel Sound present, Soft and no tenderness Extremities: no Pedal edema, on calf tenderness Neurology: Grossly no focal neuro deficit.  The results of significant diagnostics from this hospitalization (including imaging, microbiology, ancillary and laboratory) are listed below for reference.    Significant Diagnostic Studies: Dg Chest 2 View  Result Date: 11/19/2018 CLINICAL DATA:  Acute onset of cough, fever and body aches. EXAM: CHEST - 2 VIEW COMPARISON:  Chest radiograph performed 11/14/2018 FINDINGS: The lungs are well-aerated. Mild right midlung and persistent mild bibasilar airspace opacities may reflect atelectasis or possibly mild pneumonia, given the patient's symptoms. There is no evidence of pleural effusion or pneumothorax. The heart is normal in size; the mediastinal contour is within normal limits. No acute osseous abnormalities are seen. IMPRESSION: Mild right midlung and persistent  mild bibasilar airspace opacities may reflect atelectasis or possibly mild pneumonia, given the patient's symptoms. Electronically Signed   By: Roanna RaiderJeffery  Chang M.D.   On: 11/19/2018 02:15   Dg Chest 2 View  Result Date: 11/14/2018 CLINICAL DATA:  Chest pain. Shortness of breath. History of sickle cell. EXAM: CHEST - 2 VIEW COMPARISON:  October 17, 2017 FINDINGS: No pneumothorax. Mild cardiomegaly. The hila and mediastinum are unchanged. Mild opacity in the right base is favored to represent scar atelectasis. Infiltrate considered less likely. Dense bones consistent with sickle cell. IMPRESSION: 1. Persistent opacity in the right base favored to represent scar or atelectasis. Infiltrate considered less likely. 2. Dense bones consistent with sickle cell. 3. No other acute abnormalities. Electronically Signed   By: Gerome Samavid  Leazer  III M.D   On: 11/14/2018 14:32   Ct Head Wo Contrast  Result Date: 11/15/2018 CLINICAL DATA:  Facial swelling and numbness.  Sickle cell disease EXAM: CT HEAD WITHOUT CONTRAST TECHNIQUE: Contiguous axial images were obtained from the base of the skull through the vertex without intravenous contrast. COMPARISON:  None. FINDINGS: Brain: The ventricles are normal in size and configuration. There is a small cavum septum pellucidum, an anatomic variant. There is no intracranial mass, hemorrhage, extra-axial fluid collection, or midline shift. The brain parenchyma appears normal. No acute infarct is appreciable. Vascular: There is no hyperdense vessel. There is no appreciable vascular calcification. Skull: The bony calvarium appears intact. Sinuses/Orbits: There is mild mucosal thickening in several ethmoid air cells. Other paranasal sinuses which are visualized are clear. Orbits appear symmetric bilaterally. Other: Mastoid air cells are clear. IMPRESSION: Mild mucosal thickening in several ethmoid air cells. Study otherwise unremarkable. Electronically Signed   By: Bretta BangWilliam  Woodruff III M.D.   On: 11/15/2018 12:24   Mr Brain Wo Contrast  Result Date: 11/16/2018 CLINICAL DATA:  Sickle cell crisis. Numbness of the right side of the face with right facial droop. EXAM: MRI HEAD WITHOUT CONTRAST TECHNIQUE: Multiplanar, multiecho pulse sequences of the brain and surrounding structures were obtained without intravenous contrast. COMPARISON:  Head CT yesterday. FINDINGS: Brain: Diffusion imaging does not show any acute or subacute infarction. The brainstem and cerebellum are normal. Cerebral hemispheres show scattered small foci of T2 and FLAIR signal within the subcortical white matter likely indicating an early manifestation of small vessel disease. No cortical or large vessel territory infarction. No mass lesion, hemorrhage, hydrocephalus or extra-axial collection. Vascular: Major vessels at the base of the brain  show flow. Skull and upper cervical spine: Hypercellular marrow consistent with the diagnosis of sickle cell disease. Sinuses/Orbits: Clear/normal Other: None IMPRESSION: No acute finding. Mild/early chronic small-vessel change of the cerebral hemispheric white matter. Hypercellular marrow pattern consistent with the diagnosis of sickle cell disease. Electronically Signed   By: Paulina FusiMark  Shogry M.D.   On: 11/16/2018 16:24    Microbiology: Recent Results (from the past 240 hour(s))  Culture, blood (routine x 2)     Status: None (Preliminary result)   Collection Time: 11/19/18 10:14 AM  Result Value Ref Range Status   Specimen Description   Final    BLOOD LEFT ARM Performed at [facility], [address] [place], [place] [address]    Special Requests   Final    BOTTLES DRAWN AEROBIC AND ANAEROBIC Blood Culture adequate volume Performed at [facility], [address] [place], [place] [address]    Culture   Final    NO GROWTH 3 DAYS Performed at [facility], [address] [place], [place] [address]    Report Status PENDING  Incomplete  Culture, blood (routine x 2)     Status: None (Preliminary result)   Collection Time: 11/19/18 10:14 AM  Result Value Ref Range Status   Specimen Description   Final    BLOOD LEFT HAND Performed at [facility], [address], [place], [place] [address]    Special Requests   Final    BOTTLES DRAWN AEROBIC AND ANAEROBIC Blood Culture adequate volume Performed at [facility], [address], [place], [place] [address]    Culture   Final    NO GROWTH 3 DAYS Performed at [facility], [address], [place], [place] [address]    Report Status PENDING  Incomplete  Respiratory Panel by PCR     Status: None   Collection Time: 11/19/18  8:20 PM  Result Value Ref Range Status   Adenovirus NOT DETECTED NOT DETECTED Final   Coronavirus 229E NOT DETECTED NOT DETECTED  Final   Coronavirus HKU1 NOT DETECTED NOT DETECTED Final   Coronavirus NL63 NOT DETECTED NOT DETECTED Final   Coronavirus OC43 NOT DETECTED NOT DETECTED Final   Metapneumovirus NOT DETECTED NOT DETECTED Final   Rhinovirus / Enterovirus NOT DETECTED NOT DETECTED Final   Influenza A NOT DETECTED NOT DETECTED Final   Influenza B NOT DETECTED NOT DETECTED Final   Parainfluenza Virus 1 NOT DETECTED NOT DETECTED Final   Parainfluenza Virus 2 NOT DETECTED NOT DETECTED Final   Parainfluenza Virus 3 NOT DETECTED NOT DETECTED Final   Parainfluenza Virus 4 NOT DETECTED NOT DETECTED Final   Respiratory Syncytial Virus NOT DETECTED NOT DETECTED Final   Bordetella pertussis NOT DETECTED NOT DETECTED Final   Chlamydophila pneumoniae NOT DETECTED NOT DETECTED Final   Mycoplasma pneumoniae NOT DETECTED NOT DETECTED Final    Comment: Performed at [facility], [address], [place], [place] [address]     Labs: CBC: Recent Labs  Lab 11/17/18 [phone] 11/19/18 0015 11/19/18 1014 11/20/18 0517 11/21/18 0332 11/22/18 0355  WBC 17.8* 20.6* 15.5* 12.8* 16.4* 13.5*  NEUTROABS 14.1* 16.4* 9.9*  --  10.8* 7.6  HGB 7.6* 6.6* 7.0* 7.0* 7.6* 8.0*  HCT 22.7* 20.0* 21.4* 22.4* 23.6* 24.9*  MCV 87.0 86.2 89.2 90.0 88.7 88.6  PLT 250 308 289 323 404* 535*   Basic Metabolic Panel: Recent Labs  Lab 11/19/18 0015 11/19/18 1014 11/19/18 1400 11/21/18 0332 11/22/18 0355  NA 138 140 139 136 138  K 3.8 3.8 3.6 4.2 4.6  CL 102 103 103 99 100  CO2 27 27 25 25 25   GLUCOSE 114* 98 119* 103* 105*  BUN 15 14 13 12 14   CREATININE 0.84 0.82 0.93 0.86 0.78  CALCIUM 9.3 9.1 8.8* 9.3 9.2   Liver Function Tests: Recent Labs  Lab 11/17/18 0758 11/19/18 0015 11/19/18 1014  AST 21 32 22  ALT 16 32 27  ALKPHOS 98 97 85  BILITOT 2.4* 2.2* 2.0*  PROT 7.7 8.3* 7.3  ALBUMIN 4.0 4.4 3.9   No results for input(s): LIPASE, AMYLASE in the last 168 hours. No results for input(s): AMMONIA in the last 168  hours. Cardiac Enzymes: No results for input(s): CKTOTAL, CKMB, CKMBINDEX, TROPONINI in the last 168 hours. BNP (last 3 results) No results for input(s): BNP in the last 8760 hours. CBG: No results for input(s): GLUCAP in the last 168 hours. Time spent: 35 minutes  Signed:  Lynden Oxford  [facility] 11/22/2018 , 5:11 PM

## 2018-11-22 NOTE — Progress Notes (Signed)
Pt discharged home in stable condition. Discharge instructions given. Script sent to pharmacy of choice. No immediate questions or concerns at this time. Pt opted to ambulate off the unit.  ?

## 2018-11-23 LAB — BPAM RBC
Blood Product Expiration Date: 202001212359
Blood Product Expiration Date: 202001242359
ISSUE DATE / TIME: 201912240347
Unit Type and Rh: 5100
Unit Type and Rh: 5100

## 2018-11-23 LAB — TYPE AND SCREEN
ABO/RH(D): B POS
Antibody Screen: NEGATIVE
Unit division: 0
Unit division: 0

## 2018-11-24 LAB — CULTURE, BLOOD (ROUTINE X 2)
Culture: NO GROWTH
Culture: NO GROWTH
Special Requests: ADEQUATE
Special Requests: ADEQUATE

## 2018-11-29 ENCOUNTER — Encounter: Payer: Self-pay | Admitting: Family Medicine

## 2018-11-29 ENCOUNTER — Ambulatory Visit (INDEPENDENT_AMBULATORY_CARE_PROVIDER_SITE_OTHER): Payer: Self-pay | Admitting: Family Medicine

## 2018-11-29 VITALS — BP 110/60 | HR 86 | Temp 98.3°F | Resp 16 | Ht 71.0 in | Wt 169.0 lb

## 2018-11-29 DIAGNOSIS — D571 Sickle-cell disease without crisis: Secondary | ICD-10-CM

## 2018-11-29 LAB — POCT URINALYSIS DIPSTICK
Bilirubin, UA: NEGATIVE
Glucose, UA: NEGATIVE
Ketones, UA: NEGATIVE
Leukocytes, UA: NEGATIVE
Nitrite, UA: NEGATIVE
Protein, UA: POSITIVE — AB
Spec Grav, UA: 1.02 (ref 1.010–1.025)
Urobilinogen, UA: 0.2 E.U./dL
pH, UA: 5 (ref 5.0–8.0)

## 2018-11-29 NOTE — Progress Notes (Signed)
[facility] INTERNAL MEDICINE AND SICKLE CELL CARE  SICKLE CELL ANEMIA FOLLOW UP VISIT PROVIDER: Mike GipAndre Conna Terada, FNP    Subjective:   Jerry [name]  is a 33 y.o.  male who  has a past medical history of Sickle cell anemia (HCC). presents for a follow up for Sickle Cell Anemia. The patient has had 2 admissions in the past 6 months.  Patient states that he plans on restarting hydroxyurea in the next week.   Pain is 3/10 in the left leg.  He was seen on 11/14/2018 and admitted to the hospital. He reports leaving a few days later and returned on 11/19/2018 and was readmitted.  Patient states that he is trying to get medicaid for sickle cell and may have to apply for disability.  Review of Systems  Constitutional: Negative.   HENT: Negative.   Eyes: Negative.   Respiratory: Negative.   Cardiovascular: Negative.   Gastrointestinal: Negative.   Genitourinary: Negative.   Musculoskeletal: Positive for myalgias (left leg).  Skin: Negative.   Neurological: Negative.   Psychiatric/Behavioral: Negative.     Objective:   Objective  BP 110/60 (BP Location: Left Arm, Patient Position: Sitting, Cuff Size: Normal)   Pulse 86   Temp 98.3 F (36.8 C) (Oral)   Resp 16   Ht 5\' 11"  (1.803 m)   Wt 169 lb (76.7 kg)   SpO2 99%   BMI 23.57 kg/m   Wt Readings from Last 3 Encounters:  11/29/18 169 lb (76.7 kg)  11/22/18 167 lb 8.8 oz (76 kg)  11/17/18 172 lb 13.5 oz (78.4 kg)     Physical Exam Vitals signs and nursing note reviewed.  Constitutional:      General: He is not in acute distress.    Appearance: He is well-developed.  HENT:     Head: Normocephalic and atraumatic.  Eyes:     Conjunctiva/sclera: Conjunctivae normal.     Pupils: Pupils are equal, round, and reactive to light.  Neck:     Musculoskeletal: Normal range of motion.  Cardiovascular:     Rate and Rhythm: Normal rate and regular rhythm.     Heart sounds: Normal heart sounds.  Pulmonary:   Effort: Pulmonary effort is normal. No respiratory distress.     Breath sounds: Normal breath sounds.  Abdominal:     General: Bowel sounds are normal. There is no distension.     Palpations: Abdomen is soft.  Musculoskeletal: Normal range of motion.  Skin:    General: Skin is warm and dry.  Neurological:     Mental Status: He is alert and oriented to person, place, and time.  Psychiatric:        Behavior: Behavior normal.        Thought Content: Thought content normal.      Assessment/Plan:   Assessment   Encounter Diagnosis  Name Primary?  Marland Kitchen. 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 Hb-SS disease without crisis [facility](HCC) Continue with current medications as previously prescribed. CBC at baseline with hemoglobin of 8. Will repeat at next visit or if symptoms present.  - Urinalysis Dipstick    Return to care as scheduled and prn. Patient verbalized understanding and agreed with plan of care.   1. Sickle cell disease - Continue Hydrea  We discussed the need for good hydration, monitoring of hydration status, avoidance of heat, cold, stress, and infection triggers. We discussed the risks and benefits of Hydrea, including bone marrow suppression,  the possibility of GI upset, skin ulcers, hair thinning, and teratogenicity. The patient was reminded of the need to seek medical attention of any symptoms of bleeding, anemia, or infection. Continue folic acid 1 mg daily to prevent aplastic bone marrow crises.   2. Pulmonary evaluation - Patient denies severe recurrent wheezes, shortness of breath with exercise, or persistent cough. If these symptoms develop, pulmonary function tests with spirometry will be ordered, and if abnormal, plan on referral to Pulmonology for further evaluation.  3. Cardiac - Routine screening for pulmonary hypertension is not recommended.  4. Eye - High risk of proliferative retinopathy. Annual eye exam with retinal exam recommended to patient.  5.  Immunization status -  Yearly influenza vaccination is recommended, as well as being up to date with Meningococcal and Pneumococcal vaccines.   6. Acute and chronic painful episodes - We discussed that pt is to receive Schedule II prescriptions only from [place]. Pt is also aware that the prescription history is available to [place] online through the [facility] CSRS. Controlled substance agreement signed. We reminded Jerry Coke that all patients receiving Schedule II narcotics must be seen for follow within one month of prescription being requested. We reviewed the terms of our pain agreement, including the need to keep medicines in a safe locked location away from children or pets, and the need to report excess sedation or constipation, measures to avoid constipation, and policies related to early refills and stolen prescriptions. According to the [place] [facility] program, we have reviewed details related to analgesia, adverse effects, aberrant behaviors.  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  Not applicable at this time.  If this occurs will use Exjade for management.   8. Vitamin D deficiency - Drisdol 50,000 units weekly. Patient encouraged to take as prescribed.   The above recommendations are taken from the NIH Evidence-Based Management of Sickle Cell Disease: Expert Panel Report, 30076.   Ms. Andr L. Riley Lam, FNP-BC [facility] [address] [place], [place] [address] [phone]  This note has been created with Dragon speech recognition software and smart phrase technology. Any transcriptional errors are unintentional.

## 2018-11-29 NOTE — Patient Instructions (Signed)
Sickle Cell Anemia, Adult °Sickle cell anemia is a condition where your red blood cells are shaped like sickles. Red blood cells carry oxygen through the body. Sickle-shaped cells do not live as long as normal red blood cells. They also clump together and block blood from flowing through the blood vessels. This prevents the body from getting enough oxygen. Sickle cell anemia causes organ damage and pain. It also increases the risk of infection. °Follow these instructions at home: °Medicines °· Take over-the-counter and prescription medicines only as told by your doctor. °· If you were prescribed an antibiotic medicine, take it as told by your doctor. Do not stop taking the antibiotic even if you start to feel better. °· If you develop a fever, do not take medicines to lower the fever right away. Tell your doctor about the fever. °Managing pain, stiffness, and swelling °· Try these methods to help with pain: °? Use a heating pad. °? Take a warm bath. °? Distract yourself, such as by watching TV. °Eating and drinking °· Drink enough fluid to keep your pee (urine) clear or pale yellow. Drink more in hot weather and during exercise. °· Limit or avoid alcohol. °· Eat a healthy diet. Eat plenty of fruits, vegetables, whole grains, and lean protein. °· Take vitamins and supplements as told by your doctor. °Traveling °· When traveling, keep these with you: °? Your medical information. °? The names of your doctors. °? Your medicines. °· If you need to take an airplane, talk to your doctor first. °Activity °· Rest often. °· Avoid exercises that make your heart beat much faster, such as jogging. °General instructions °· Do not use products that have nicotine or tobacco, such as cigarettes and e-cigarettes. If you need help quitting, ask your doctor. °· Consider wearing a medical alert bracelet. °· Avoid being in high places (high altitudes), such as mountains. °· Avoid very hot or cold temperatures. °· Avoid places where the  temperature changes a lot. °· Keep all follow-up visits as told by your doctor. This is important. °Contact a doctor if: °· A joint hurts. °· Your feet or hands hurt or swell. °· You feel tired (fatigued). °Get help right away if: °· You have symptoms of infection. These include: °? Fever. °? Chills. °? Being very tired. °? Irritability. °? Poor eating. °? Throwing up (vomiting). °· You feel dizzy or faint. °· You have new stomach pain, especially on the left side. °· You have a an erection (priapism) that lasts more than 4 hours. °· You have numbness in your arms or legs. °· You have a hard time moving your arms or legs. °· You have trouble talking. °· You have pain that does not go away when you take medicine. °· You are short of breath. °· You are breathing fast. °· You have a long-term cough. °· You have pain in your chest. °· You have a bad headache. °· You have a stiff neck. °· Your stomach looks bloated even though you did not eat much. °· Your skin is pale. °· You suddenly cannot see well. °Summary °· Sickle cell anemia is a condition where your red blood cells are shaped like sickles. °· Follow your doctor's advice on ways to manage pain, food to eat, activities to do, and steps to take for safe travel. °· Get medical help right away if you have any signs of infection, such as a fever. °This information is not intended to replace advice given to you by your   health care provider. Make sure you discuss any questions you have with your health care provider. °Document Released: 09/03/2013 Document Revised: 12/19/2016 Document Reviewed: 12/19/2016 °Elsevier Interactive Patient Education © 2019 [employer] ° °

## 2018-12-27 ENCOUNTER — Ambulatory Visit: Payer: Self-pay | Admitting: Family Medicine

## 2019-01-01 ENCOUNTER — Telehealth: Payer: Self-pay

## 2019-01-01 DIAGNOSIS — D571 Sickle-cell disease without crisis: Secondary | ICD-10-CM

## 2019-01-01 MED ORDER — HYDROXYUREA 500 MG PO CAPS: 1500.0000 mg | ORAL_CAPSULE | Freq: Every day | ORAL | 3 refills | Status: DC

## 2019-01-01 MED ORDER — IBUPROFEN 800 MG PO TABS: 800.0000 mg | ORAL_TABLET | Freq: Three times a day (TID) | ORAL | 0 refills | Status: DC

## 2019-01-01 MED ORDER — FOLIC ACID 1 MG PO TABS: 1.0000 mg | ORAL_TABLET | Freq: Every day | ORAL | 3 refills | Status: DC

## 2019-01-01 NOTE — Telephone Encounter (Signed)
Refill sent into pharmacy. Thanks!  

## 2019-01-02 MED ORDER — OXYCODONE HCL 10 MG PO TABS: 10.0000 mg | ORAL_TABLET | Freq: Four times a day (QID) | ORAL | 0 refills | Status: DC | PRN

## 2019-01-02 NOTE — Telephone Encounter (Signed)
Refilled

## 2019-01-14 ENCOUNTER — Telehealth: Payer: Self-pay

## 2019-01-14 DIAGNOSIS — D571 Sickle-cell disease without crisis: Secondary | ICD-10-CM

## 2019-01-14 NOTE — Telephone Encounter (Signed)
Jerry [name] at [facility] states that patient last pick up his Oxycodone on 11/18/2018 and that they didn't receive it on 01/02/2019.

## 2019-01-15 ENCOUNTER — Telehealth: Payer: Self-pay

## 2019-01-15 MED ORDER — OXYCODONE HCL 10 MG PO TABS: 10.0000 mg | ORAL_TABLET | Freq: Four times a day (QID) | ORAL | 0 refills | Status: DC | PRN

## 2019-01-15 NOTE — Telephone Encounter (Signed)
Called, no answer. Left a message that rx has been sent into pharmacy since they did not get it yesterday. Thanks!

## 2019-01-15 NOTE — Telephone Encounter (Signed)
Medication was sent. Failed on 01/02/2019

## 2019-01-15 NOTE — Telephone Encounter (Signed)
Reviewed. The prescription failed and we were unaware.

## 2019-01-17 ENCOUNTER — Ambulatory Visit (INDEPENDENT_AMBULATORY_CARE_PROVIDER_SITE_OTHER): Payer: Self-pay | Admitting: Family Medicine

## 2019-01-17 ENCOUNTER — Encounter: Payer: Self-pay | Admitting: Family Medicine

## 2019-01-17 VITALS — BP 109/64 | HR 83 | Temp 97.5°F | Resp 14 | Ht 71.0 in | Wt 185.0 lb

## 2019-01-17 DIAGNOSIS — D571 Sickle-cell disease without crisis: Secondary | ICD-10-CM

## 2019-01-17 DIAGNOSIS — Z23 Encounter for immunization: Secondary | ICD-10-CM

## 2019-01-17 LAB — POCT URINALYSIS DIPSTICK
Bilirubin, UA: NEGATIVE
Blood, UA: NEGATIVE
Glucose, UA: NEGATIVE
Ketones, UA: NEGATIVE
Leukocytes, UA: NEGATIVE
Nitrite, UA: NEGATIVE
Protein, UA: POSITIVE — AB
Spec Grav, UA: 1.02 (ref 1.010–1.025)
Urobilinogen, UA: 1 E.U./dL
pH, UA: 6 (ref 5.0–8.0)

## 2019-01-17 NOTE — Addendum Note (Signed)
Addended by: Loney Hering on: 01/17/2019 10:28 AM   Modules accepted: Orders

## 2019-01-17 NOTE — Progress Notes (Addendum)
[facility] CELL [facility]ARE  SICKLE CELL ANEMIA FOLLOW UP VISIT PROVIDER: Mike Gip, FNP    Subjective:   Jerry [name]  is a 33 y.o.  male who  has a past medical history of Sickle cell anemia (HCC). presents for a follow up for Sickle Cell Anemia. The patient has had 2 admissions in the past 6 months.  He has restarted hydroxyurea. He reports compliance with all medications. The patient reports adequate daily hydration.  He is currently working and having to lift heavy objects throughout his shift. He states that he is sore due to this, but he is controlling his pain with ibuprofen and water.  His  girlfriend is pregnant and he is excited about this. This is his first child. He would like to discuss continuing the gardisil vaccine today. Last dose in 07/2018.  Review of Systems  Constitutional: Negative.   HENT: Negative.   Eyes: Negative.   Respiratory: Negative.   Cardiovascular: Negative.   Gastrointestinal: Negative.   Genitourinary: Negative.   Musculoskeletal: Positive for myalgias (left leg).  Skin: Negative.   Neurological: Negative.   Psychiatric/Behavioral: Negative.     Objective:   Objective  BP 109/64 (BP Location: Right Arm, Patient Position: Sitting, Cuff Size: Normal)   Pulse 83   Temp (!) 97.5 F (36.4 C) (Oral)   Resp 14   Ht 5\' 11"  (1.803 m)   Wt 185 lb (83.9 kg)   SpO2 95%   BMI 25.80 kg/m   Wt Readings from Last 3 Encounters:  01/17/19 185 lb (83.9 kg)  11/29/18 169 lb (76.7 kg)  11/22/18 167 lb 8.8 oz (76 kg)     Physical Exam Vitals signs and nursing note reviewed.  Constitutional:      General: He is not in acute distress.    Appearance: He is well-developed.  HENT:     Head: Normocephalic and atraumatic.  Eyes:     Conjunctiva/sclera: Conjunctivae normal.     Pupils: Pupils are equal, round, and reactive to light.  Neck:     Musculoskeletal: Normal range of motion.  Cardiovascular:   Rate and Rhythm: Normal rate and regular rhythm.     Heart sounds: Normal heart sounds.  Pulmonary:     Effort: Pulmonary effort is normal. No respiratory distress.     Breath sounds: Normal breath sounds.  Abdominal:     General: Bowel sounds are normal. There is no distension.     Palpations: Abdomen is soft.  Musculoskeletal: Normal range of motion.  Skin:    General: Skin is warm and dry.  Neurological:     Mental Status: He is alert and oriented to person, place, and time.  Psychiatric:        Behavior: Behavior normal.        Thought Content: Thought content normal.      Assessment/Plan:   Assessment   Encounter Diagnosis  Name Primary?  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 Hb-SS disease without crisis [facility]) Pending labs. Will adjust medications accordingly.    - Urinalysis Dipstick - CBC with Differential    Return to care as scheduled and prn. Patient verbalized understanding and agreed with plan of care.   1. Sickle cell disease - Continue Hydrea  We discussed the need for good hydration, monitoring of hydration status, avoidance of heat, cold, stress, and infection triggers. We discussed the risks and benefits of Hydrea, including bone  marrow suppression, the possibility of GI upset, skin ulcers, hair thinning, and teratogenicity. The patient was reminded of the need to seek medical attention of any symptoms of bleeding, anemia, or infection. Continue folic acid 1 mg daily to prevent aplastic bone marrow crises.   2. Pulmonary evaluation - Patient denies severe recurrent wheezes, shortness of breath with exercise, or persistent cough. If these symptoms develop, pulmonary function tests with spirometry will be ordered, and if abnormal, plan on referral to Pulmonology for further evaluation.  3. Cardiac - Routine screening for pulmonary hypertension is not recommended.  4. Eye - High risk of proliferative retinopathy. Annual eye exam with retinal  exam recommended to patient.  5. Immunization status -  Yearly influenza vaccination is recommended, as well as being up to date with Meningococcal and Pneumococcal vaccines.   6. Acute and chronic painful episodes - We discussed that pt is to receive Schedule II prescriptions only from [place]. Pt is also aware that the prescription history is available to [place] online through the [facility] CSRS. Controlled substance agreement signed. We reminded [name] that all patients receiving Schedule II narcotics must be seen for follow within one month of prescription being requested. We reviewed the terms of our pain agreement, including the need to keep medicines in a safe locked location away from children or pets, and the need to report excess sedation or constipation, measures to avoid constipation, and policies related to early refills and stolen prescriptions. According to the [place] [facility] program, we have reviewed details related to analgesia, adverse effects, aberrant behaviors.  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  Not applicable at this time.  If this occurs will use Exjade for management.   8. Vitamin D deficiency - Drisdol 50,000 units weekly. Patient encouraged to take as prescribed.   The above recommendations are taken from the NIH Evidence-Based Management of Sickle Cell Disease: Expert Panel Report, 86754.   Ms. Andr L. Riley Lam, FNP-BC [facility] [address] [place], [place] [address] [phone]  This note has been created with Dragon speech recognition software and smart phrase technology. Any transcriptional errors are unintentional.

## 2019-01-17 NOTE — Patient Instructions (Signed)
Sickle Cell Anemia, Adult °Sickle cell anemia is a condition where your red blood cells are shaped like sickles. Red blood cells carry oxygen through the body. Sickle-shaped cells do not live as long as normal red blood cells. They also clump together and block blood from flowing through the blood vessels. This prevents the body from getting enough oxygen. Sickle cell anemia causes organ damage and pain. It also increases the risk of infection. °Follow these instructions at home: °Medicines °· Take over-the-counter and prescription medicines only as told by your doctor. °· If you were prescribed an antibiotic medicine, take it as told by your doctor. Do not stop taking the antibiotic even if you start to feel better. °· If you develop a fever, do not take medicines to lower the fever right away. Tell your doctor about the fever. °Managing pain, stiffness, and swelling °· Try these methods to help with pain: °? Use a heating pad. °? Take a warm bath. °? Distract yourself, such as by watching TV. °Eating and drinking °· Drink enough fluid to keep your pee (urine) clear or pale yellow. Drink more in hot weather and during exercise. °· Limit or avoid alcohol. °· Eat a healthy diet. Eat plenty of fruits, vegetables, whole grains, and lean protein. °· Take vitamins and supplements as told by your doctor. °Traveling °· When traveling, keep these with you: °? Your medical information. °? The names of your doctors. °? Your medicines. °· If you need to take an airplane, talk to your doctor first. °Activity °· Rest often. °· Avoid exercises that make your heart beat much faster, such as jogging. °General instructions °· Do not use products that have nicotine or tobacco, such as cigarettes and e-cigarettes. If you need help quitting, ask your doctor. °· Consider wearing a medical alert bracelet. °· Avoid being in high places (high altitudes), such as mountains. °· Avoid very hot or cold temperatures. °· Avoid places where the  temperature changes a lot. °· Keep all follow-up visits as told by your doctor. This is important. °Contact a doctor if: °· A joint hurts. °· Your feet or hands hurt or swell. °· You feel tired (fatigued). °Get help right away if: °· You have symptoms of infection. These include: °? Fever. °? Chills. °? Being very tired. °? Irritability. °? Poor eating. °? Throwing up (vomiting). °· You feel dizzy or faint. °· You have new stomach pain, especially on the left side. °· You have a an erection (priapism) that lasts more than 4 hours. °· You have numbness in your arms or legs. °· You have a hard time moving your arms or legs. °· You have trouble talking. °· You have pain that does not go away when you take medicine. °· You are short of breath. °· You are breathing fast. °· You have a long-term cough. °· You have pain in your chest. °· You have a bad headache. °· You have a stiff neck. °· Your stomach looks bloated even though you did not eat much. °· Your skin is pale. °· You suddenly cannot see well. °Summary °· Sickle cell anemia is a condition where your red blood cells are shaped like sickles. °· Follow your doctor's advice on ways to manage pain, food to eat, activities to do, and steps to take for safe travel. °· Get medical help right away if you have any signs of infection, such as a fever. °This information is not intended to replace advice given to you by your   health care provider. Make sure you discuss any questions you have with your health care provider. °Document Released: 09/03/2013 Document Revised: 12/19/2016 Document Reviewed: 12/19/2016 °Elsevier Interactive Patient Education © 2019 [employer] ° °

## 2019-01-18 LAB — CBC WITH DIFFERENTIAL/PLATELET
Basophils Absolute: 0 10*3/uL (ref 0.0–0.2)
Basos: 0 %
EOS (ABSOLUTE): 0.3 10*3/uL (ref 0.0–0.4)
Eos: 3 %
Hematocrit: 28.5 % — ABNORMAL LOW (ref 37.5–51.0)
Hemoglobin: 9.7 g/dL — ABNORMAL LOW (ref 13.0–17.7)
Immature Grans (Abs): 0.1 10*3/uL (ref 0.0–0.1)
Immature Granulocytes: 1 %
Lymphocytes Absolute: 2.9 10*3/uL (ref 0.7–3.1)
Lymphs: 28 %
MCH: 32.9 pg (ref 26.6–33.0)
MCHC: 34 g/dL (ref 31.5–35.7)
MCV: 97 fL (ref 79–97)
Monocytes Absolute: 1.7 10*3/uL — ABNORMAL HIGH (ref 0.1–0.9)
Monocytes: 16 %
NRBC: 3 % — ABNORMAL HIGH (ref 0–0)
Neutrophils Absolute: 5.5 10*3/uL (ref 1.4–7.0)
Neutrophils: 52 %
Platelets: 382 10*3/uL (ref 150–450)
RBC: 2.95 x10E6/uL — ABNORMAL LOW (ref 4.14–5.80)
RDW: 23.8 % — ABNORMAL HIGH (ref 11.6–15.4)
WBC: 10.5 10*3/uL (ref 3.4–10.8)

## 2019-01-22 ENCOUNTER — Telehealth: Payer: Self-pay

## 2019-01-22 NOTE — Telephone Encounter (Signed)
Called, no answer. Left a message that all labs are stable and no changes at this time. Asked if any questions to call back to our office. Thanks!

## 2019-01-22 NOTE — Telephone Encounter (Signed)
-----   Message from Mike Gip, FNP sent at 01/22/2019  8:08 AM EST ----- The  labs are stable without significant clinical change.  All other results are normal or within acceptable limits. No Medication changes

## 2019-02-09 IMAGING — DX DG CHEST 2V
2 series · 2 of 2 positions shown · non-contrast
Comparison: October 17, 2017

CLINICAL DATA: Chest pain. Shortness of breath. History of sickle
cell.

EXAM:
CHEST - 2 VIEW

[w chest lat]
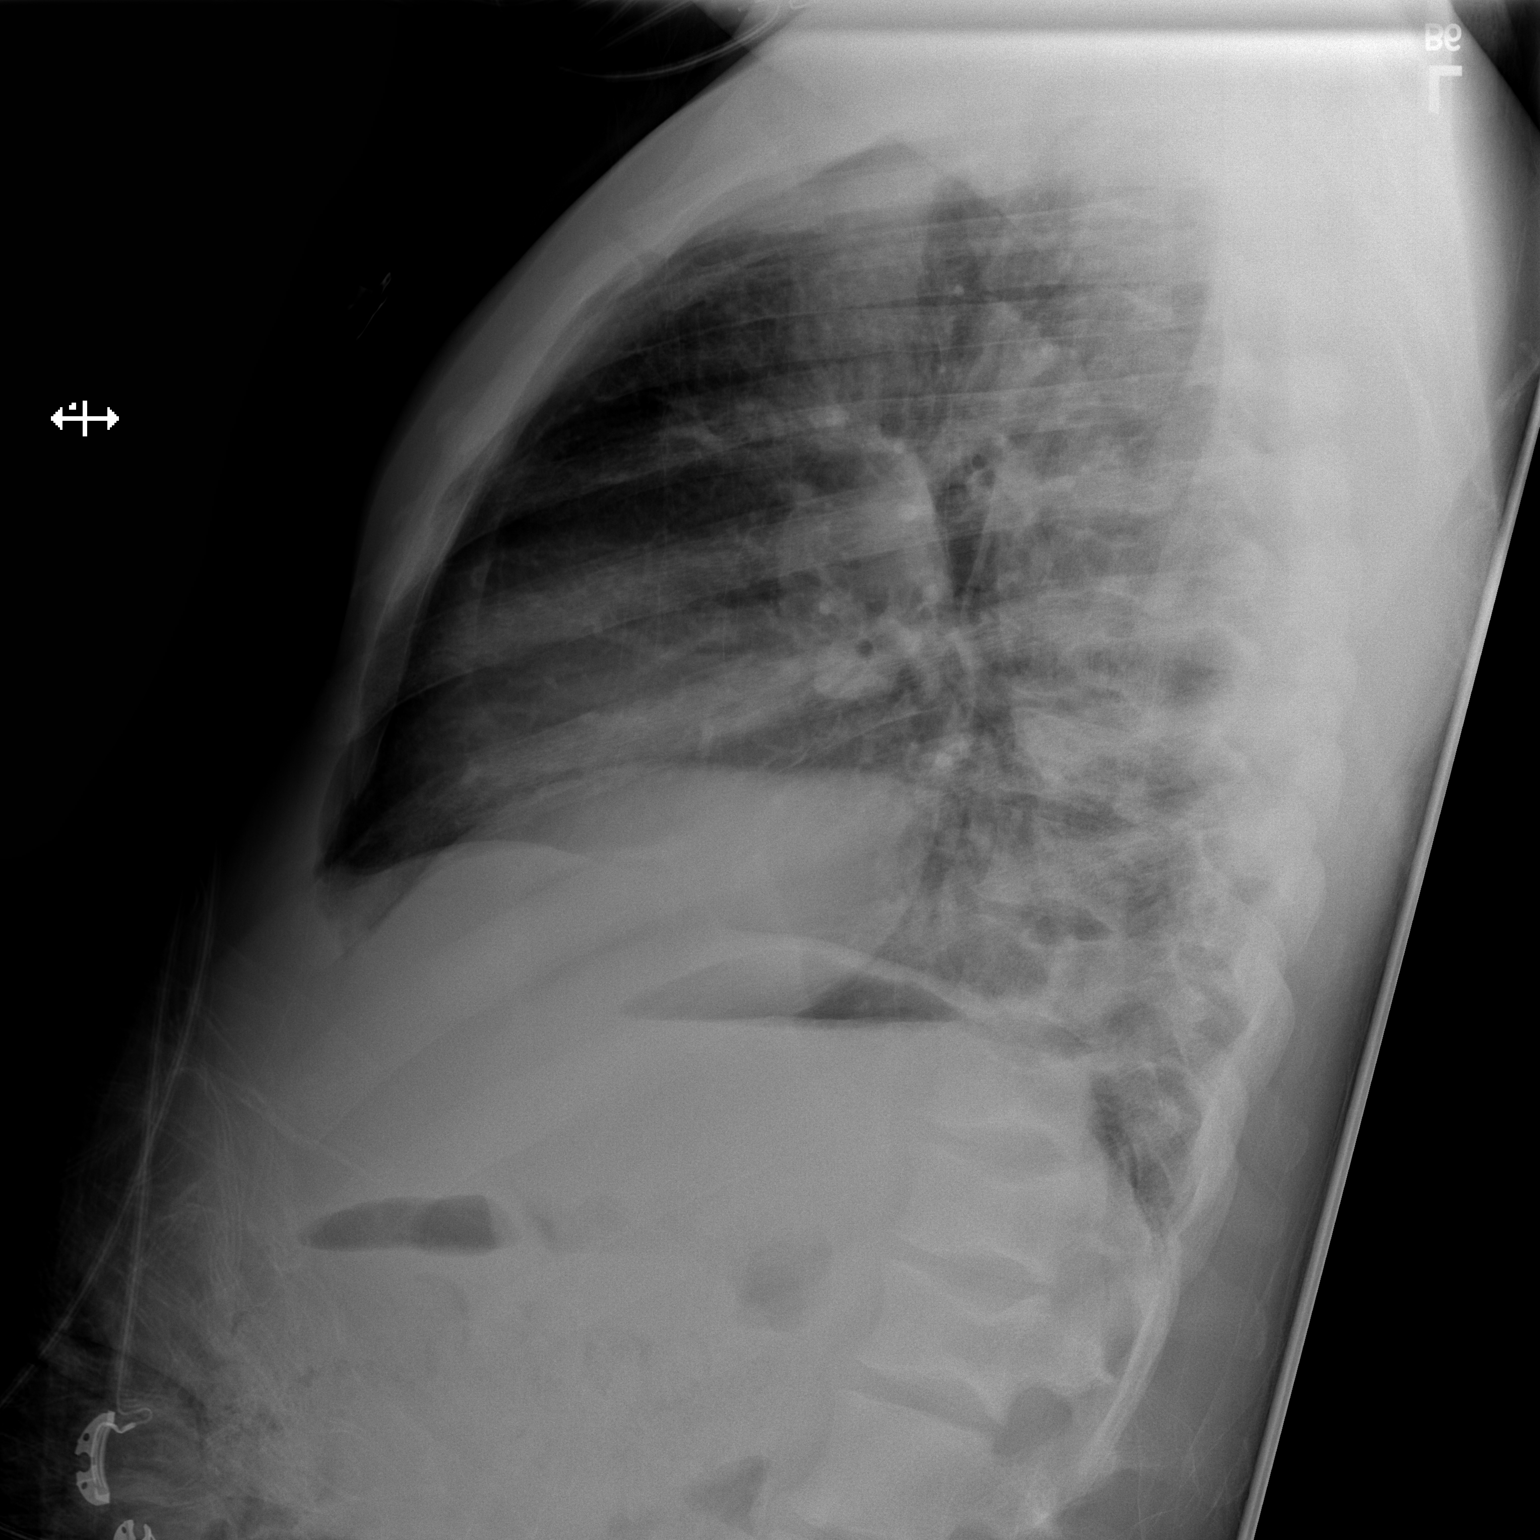

[x chest ap]
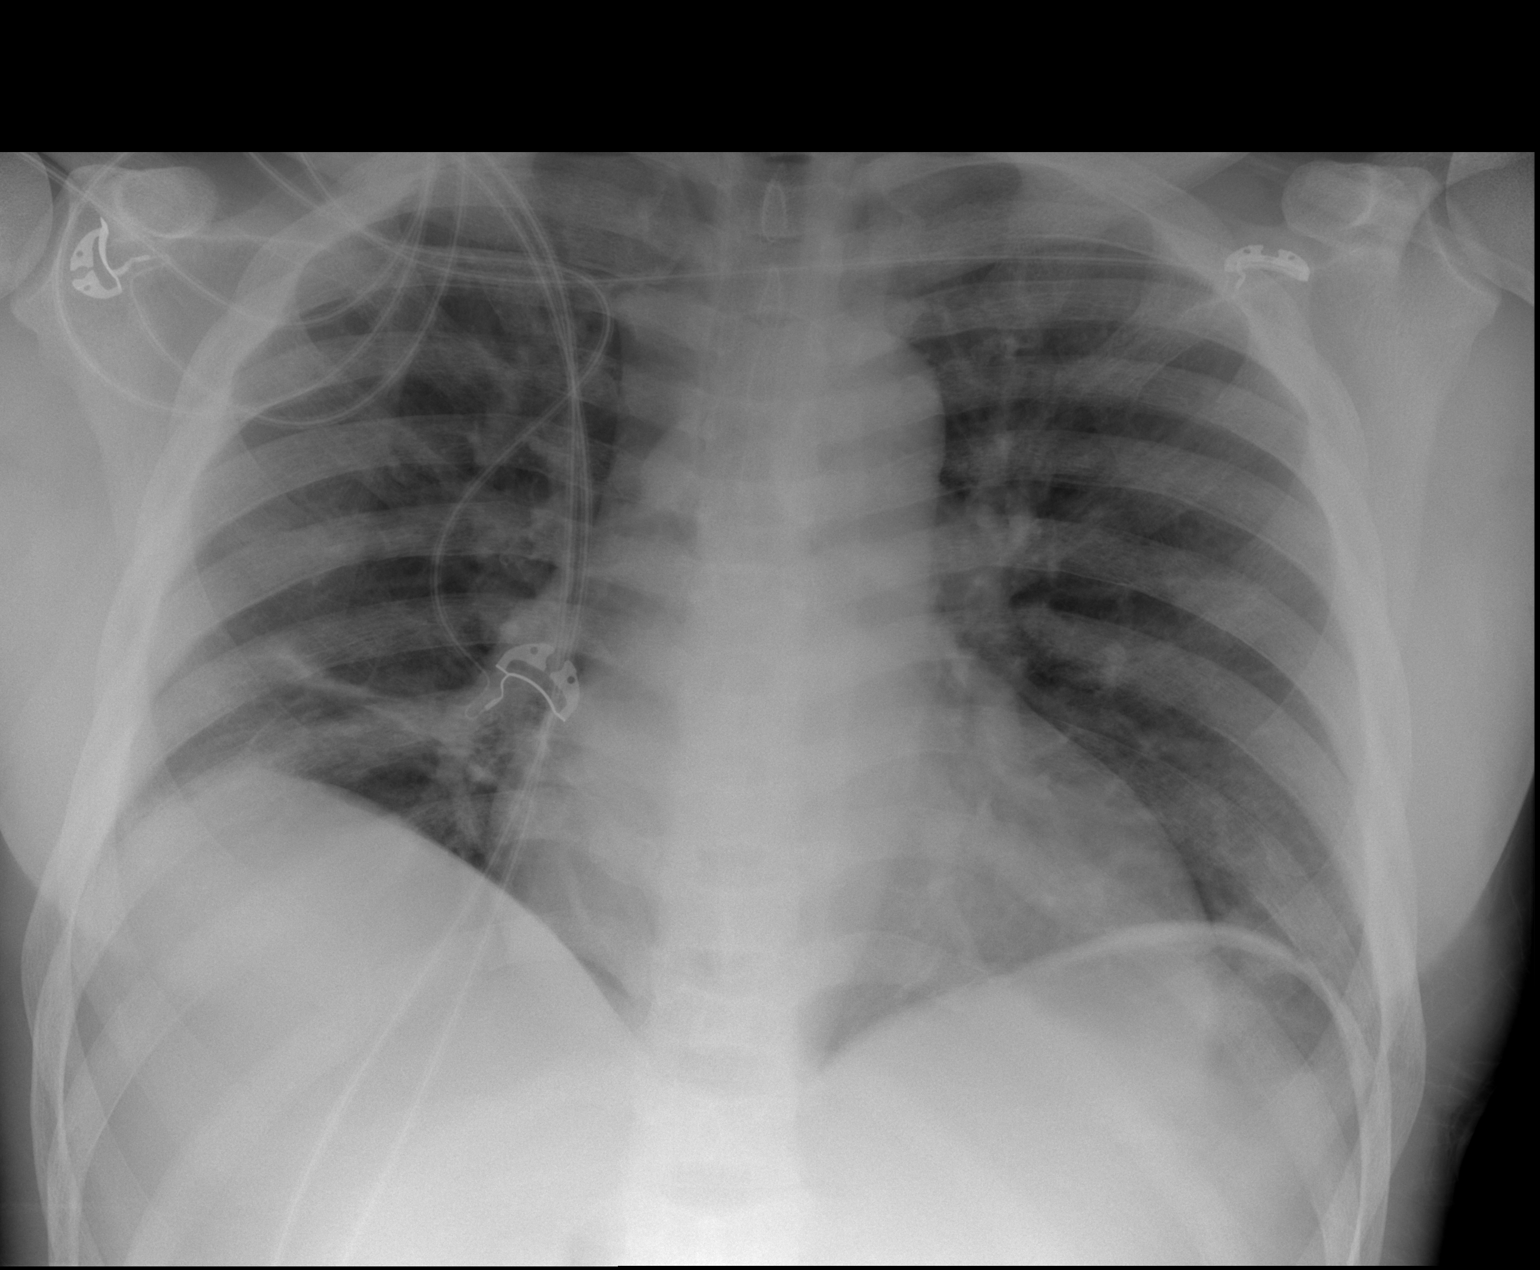

[2 of 2 positions shown; findings below may reference images not displayed]

FINDINGS: No pneumothorax. Mild cardiomegaly. The hila and mediastinum are
unchanged. Mild opacity in the right base is favored to represent
scar atelectasis. Infiltrate considered less likely. Dense bones
consistent with sickle cell.
IMPRESSION: 1. Persistent opacity in the right base favored to represent scar or
atelectasis. Infiltrate considered less likely.
2. Dense bones consistent with sickle cell.
3. No other acute abnormalities.

## 2019-02-10 IMAGING — CT CT HEAD W/O CM
3 series · 15 of 47 positions shown, 18 images · non-contrast
Comparison: None.

CLINICAL DATA: Facial swelling and numbness.  Sickle cell disease

EXAM:
CT HEAD WITHOUT CONTRAST
TECHNIQUE: Contiguous axial images were obtained from the base of the skull
through the vertex without intravenous contrast.

[Series 2: head wo · axial · 0.47mm/px · z∈[+1238,+1368]mm · 9 of 32 slices shown, 12 images]
[im 3/32  brain]
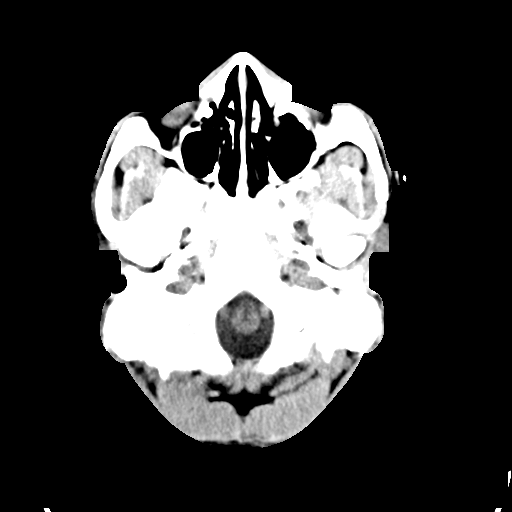
[im 3/32  bone]
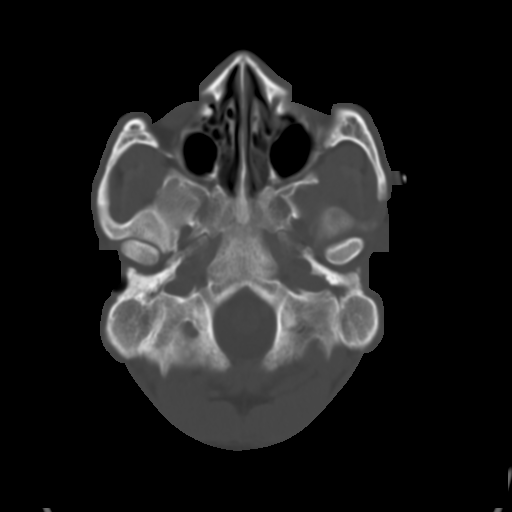
[im 6/32  brain]
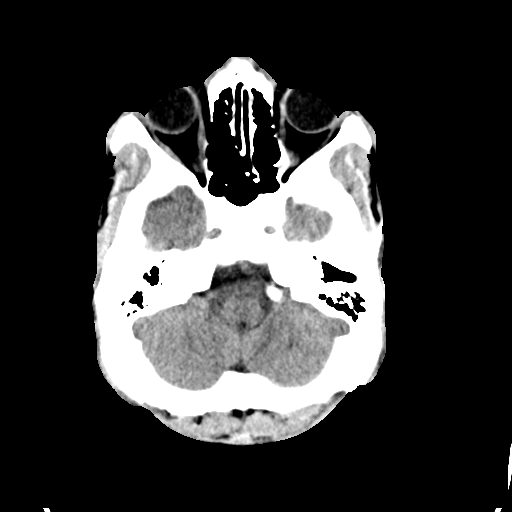
[im 9/32  brain]
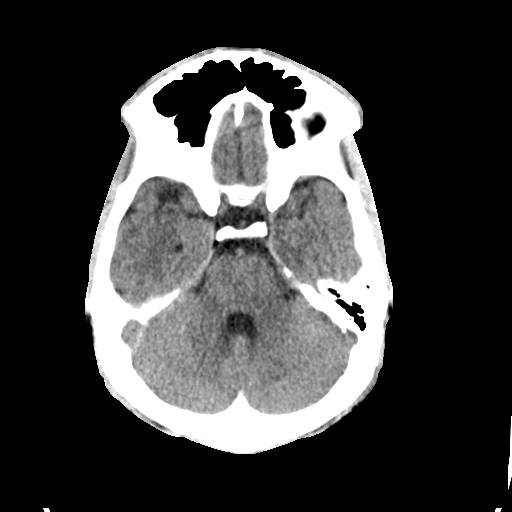
[im 12/32  brain]
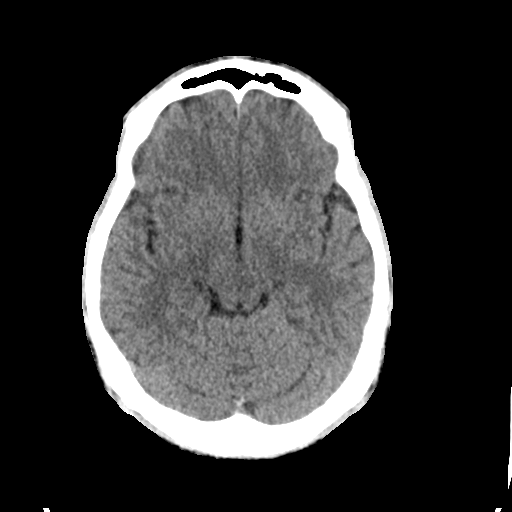
[im 17/32  brain]
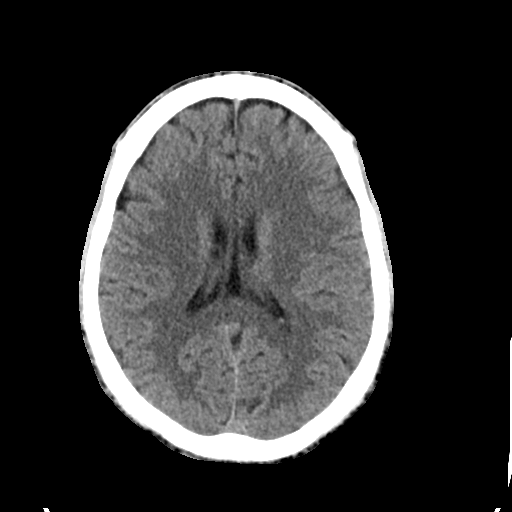
[im 17/32  bone]
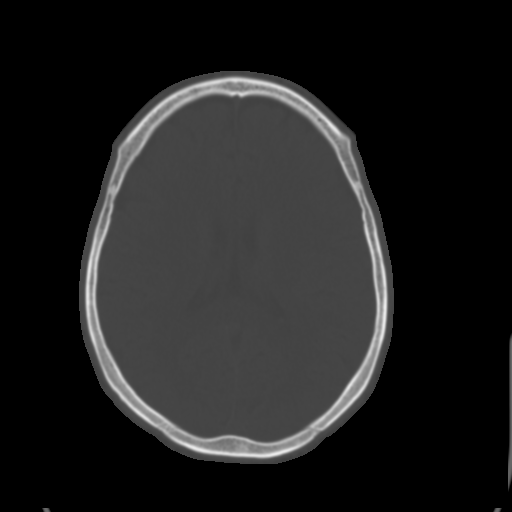
[im 20/32  brain]
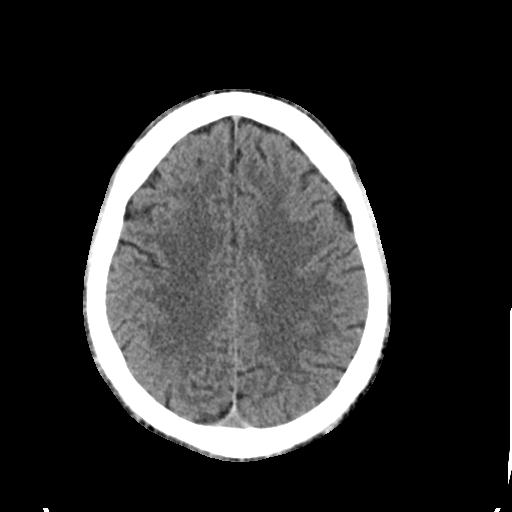
[im 23/32  brain]
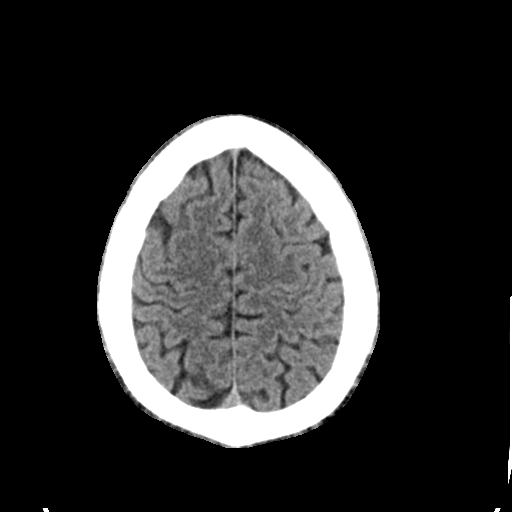
[im 26/32  brain]
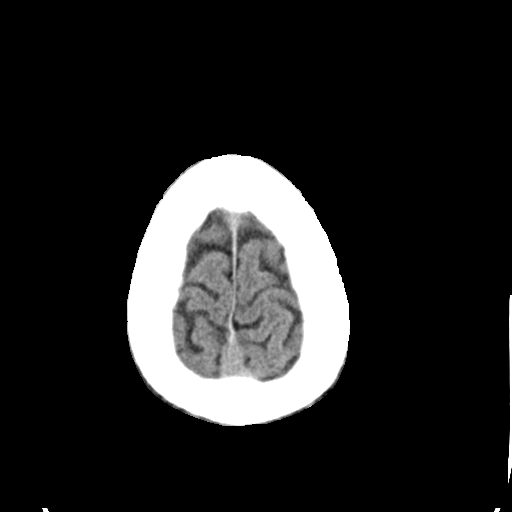
[im 29/32  brain]
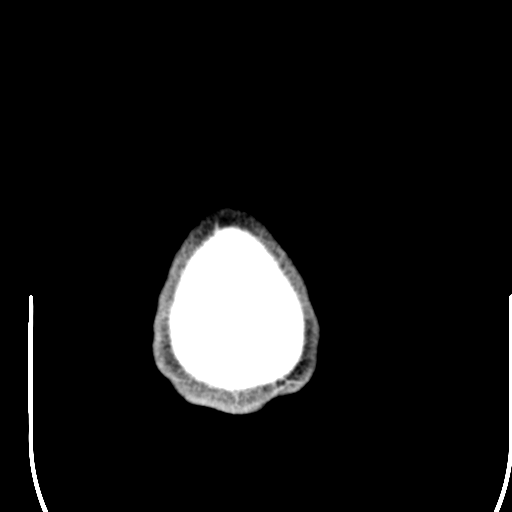
[im 29/32  bone]
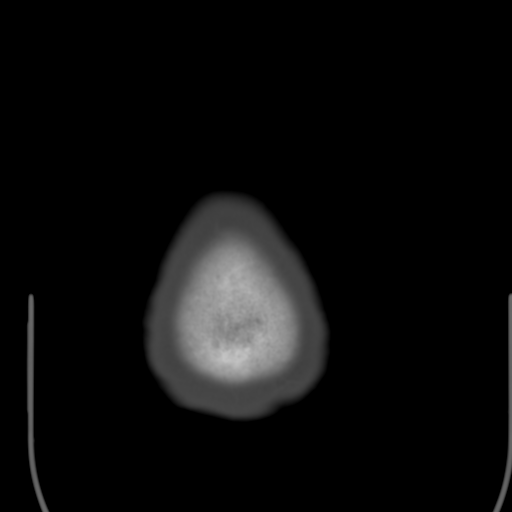

[Series 4: coronal soft tissue · coronal · 0.30mm/px · 3 of 71 slices shown]
[im 24/71  brain]
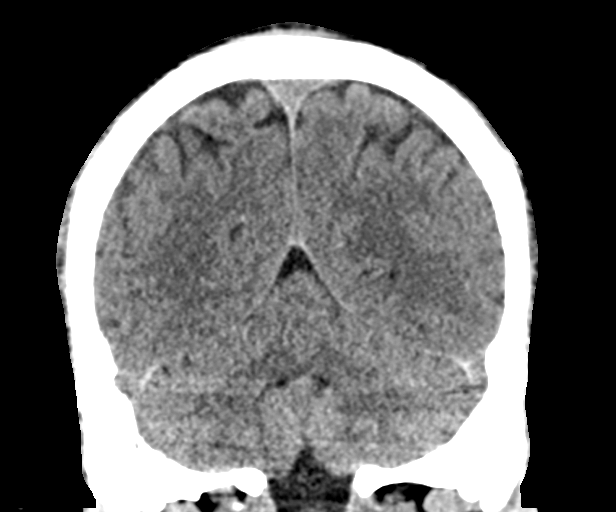
[im 32/71  brain]
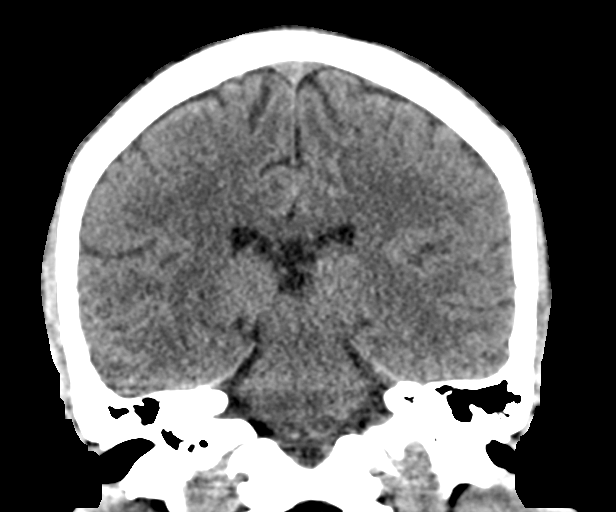
[im 39/71  brain]
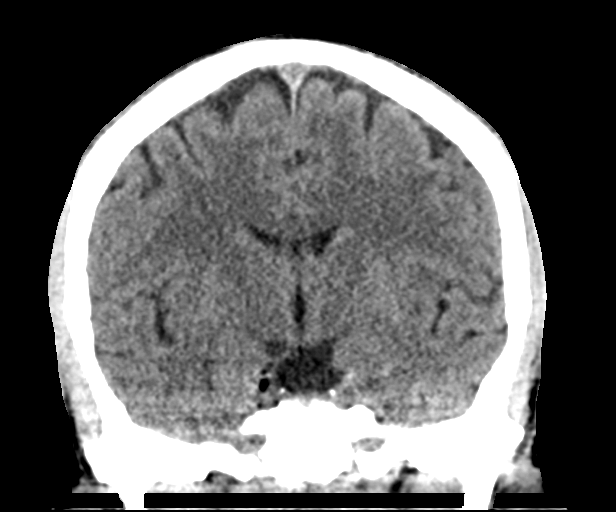

[Series 5: sagittal soft tissue · sagittal · 0.31mm/px · 3 of 63 slices shown]
[im 21/63  brain]
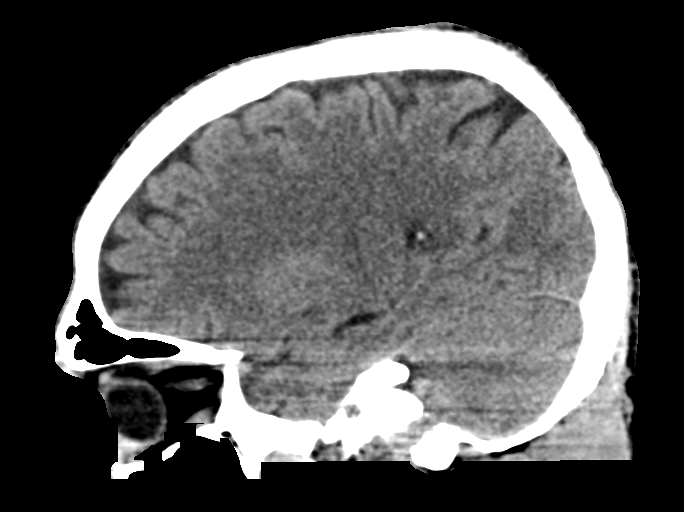
[im 32/63  brain]
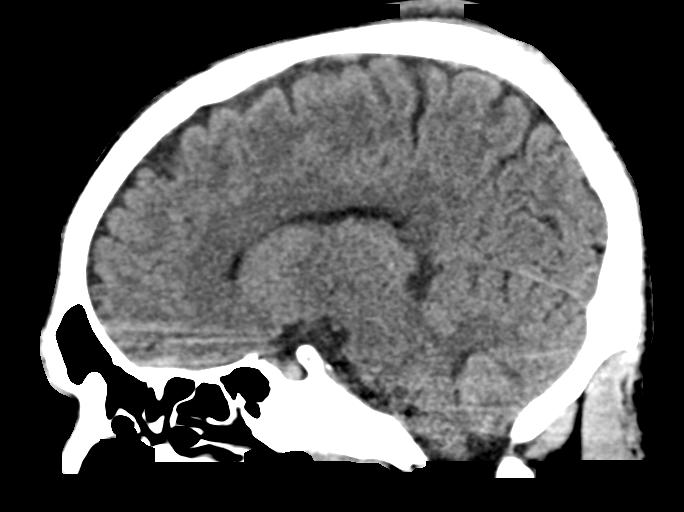
[im 42/63  brain]
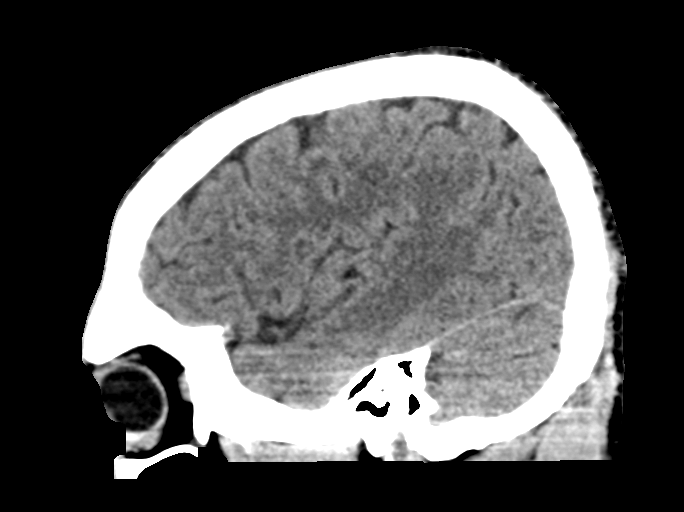

[15 of 47 positions shown; findings below may reference images not displayed]

FINDINGS: Brain: The ventricles are normal in size and configuration. There is
a small cavum septum pellucidum, an anatomic variant. There is no
intracranial mass, hemorrhage, extra-axial fluid collection, or
midline shift. The brain parenchyma appears normal. No acute infarct
is appreciable.

Vascular: There is no hyperdense vessel. There is no appreciable
vascular calcification.

Skull: The bony calvarium appears intact.

Sinuses/Orbits: There is mild mucosal thickening in several ethmoid
air cells. Other paranasal sinuses which are visualized are clear.
Orbits appear symmetric bilaterally.

Other: Mastoid air cells are clear.
IMPRESSION: Mild mucosal thickening in several ethmoid air cells. Study
otherwise unremarkable.

## 2019-02-17 ENCOUNTER — Telehealth: Payer: Self-pay

## 2019-02-17 ENCOUNTER — Other Ambulatory Visit: Payer: Self-pay

## 2019-02-17 DIAGNOSIS — D571 Sickle-cell disease without crisis: Secondary | ICD-10-CM

## 2019-02-17 MED ORDER — OXYCODONE HCL 10 MG PO TABS: 10.0000 mg | ORAL_TABLET | Freq: Four times a day (QID) | ORAL | 0 refills | Status: DC | PRN

## 2019-02-17 MED ORDER — HYDROXYUREA 500 MG PO CAPS: 1500.0000 mg | ORAL_CAPSULE | Freq: Every day | ORAL | 3 refills | Status: DC

## 2019-02-17 MED ORDER — IBUPROFEN 800 MG PO TABS: 800.0000 mg | ORAL_TABLET | Freq: Three times a day (TID) | ORAL | 0 refills | Status: DC

## 2019-02-17 MED ORDER — FOLIC ACID 1 MG PO TABS: 1.0000 mg | ORAL_TABLET | Freq: Every day | ORAL | 3 refills | Status: DC

## 2019-02-17 NOTE — Telephone Encounter (Signed)
Patient needs a refill on Oxycodone and Ibuprofen 

## 2019-02-17 NOTE — Telephone Encounter (Signed)
Medication sent to pharmacy  

## 2019-03-14 ENCOUNTER — Encounter: Payer: Self-pay | Admitting: Family Medicine

## 2019-03-14 ENCOUNTER — Ambulatory Visit (INDEPENDENT_AMBULATORY_CARE_PROVIDER_SITE_OTHER): Payer: Self-pay | Admitting: Family Medicine

## 2019-03-14 ENCOUNTER — Other Ambulatory Visit: Payer: Self-pay

## 2019-03-14 ENCOUNTER — Ambulatory Visit: Payer: Self-pay | Admitting: Family Medicine

## 2019-03-14 DIAGNOSIS — D571 Sickle-cell disease without crisis: Secondary | ICD-10-CM

## 2019-03-14 MED ORDER — FOLIC ACID 1 MG PO TABS: 1.0000 mg | ORAL_TABLET | Freq: Every day | ORAL | 3 refills | Status: DC

## 2019-03-14 MED ORDER — HYDROXYUREA 500 MG PO CAPS: 1500.0000 mg | ORAL_CAPSULE | Freq: Every day | ORAL | 3 refills | Status: DC

## 2019-03-14 MED ORDER — IBUPROFEN 800 MG PO TABS: 800.0000 mg | ORAL_TABLET | Freq: Three times a day (TID) | ORAL | 2 refills | Status: DC

## 2019-03-14 MED ORDER — OXYCODONE HCL 10 MG PO TABS: 10.0000 mg | ORAL_TABLET | Freq: Four times a day (QID) | ORAL | 0 refills | Status: DC | PRN

## 2019-03-14 NOTE — Progress Notes (Signed)
  [facility] Cell [facility]  Virtual Visit via Telephone Note  I connected with Jerry [name] on 03/14/19 at  1:40 PM EDT by telephone and verified that I am speaking with the correct person using two identifiers.   I discussed the limitations, risks, security and privacy concerns of performing an evaluation and management service by telephone and the availability of in person appointments. I also discussed with the patient that there may be a patient responsible charge related to this service. The patient expressed understanding and agreed to proceed.   History of Present Illness: Patient with a PMH of sickle cell anemia. He states that he recently had a pain crisis that he managed at home. He is feeling better today. He also reports having difficulty with paying for hydrea. He has not had it for a few days and believes this what precipitated the last crisis. He is working with [facility] cell agency to obtain health insurance.     Observations/Objective: Patient with regular voice tone, rate and rhythm. Speaking calmly and is in no apparent distress.    Assessment and Plan: 1. Hb-SS disease without crisis (HCC) - hydroxyurea (HYDREA) 500 MG capsule; Take 3 capsules (1,500 mg total) by mouth daily. May take with food to minimize GI side effects.  Dispense: 90 capsule; Refill: 3 - Oxycodone HCl 10 MG TABS; Take 1 tablet (10 mg total) by mouth every 6 (six) hours as needed for up to 15 days.  Dispense: 60 tablet; Refill: 0 - folic acid (FOLVITE) 1 MG tablet; Take 1 tablet (1 mg total) by mouth daily.  Dispense: 90 tablet; Refill: 3 - ibuprofen (ADVIL) 800 MG tablet; Take 1 tablet (800 mg total) by mouth 3 (three) times daily.  Dispense: 30 tablet; Refill: 2  [facility]   Follow Up Instructions:    I discussed the assessment and treatment plan with the patient. The patient was provided an  opportunity to ask questions and all were answered. The patient agreed with the plan and demonstrated an understanding of the instructions.   The patient was advised to call back or seek an in-person evaluation if the symptoms worsen or if the condition fails to improve as anticipated.  I provided 10 minutes of non-face-to-face time during this encounter.  Ms. Andr L. Riley Lam, FNP-BC [facility] [address] [place], [place] [address] [phone]

## 2019-04-14 ENCOUNTER — Telehealth: Payer: Self-pay

## 2019-04-14 DIAGNOSIS — D571 Sickle-cell disease without crisis: Secondary | ICD-10-CM

## 2019-04-14 MED ORDER — FOLIC ACID 1 MG PO TABS: 1.0000 mg | ORAL_TABLET | Freq: Every day | ORAL | 3 refills | Status: DC

## 2019-04-14 MED ORDER — HYDROXYUREA 500 MG PO CAPS: 1500.0000 mg | ORAL_CAPSULE | Freq: Every day | ORAL | 3 refills | Status: DC

## 2019-04-14 NOTE — Telephone Encounter (Signed)
Refills sent into pharmacy. Thanks!  

## 2019-04-15 MED ORDER — OXYCODONE HCL 10 MG PO TABS: 10.0000 mg | ORAL_TABLET | Freq: Four times a day (QID) | ORAL | 0 refills | Status: DC | PRN

## 2019-04-15 NOTE — Telephone Encounter (Signed)
Medication refilled

## 2019-04-19 ENCOUNTER — Encounter (HOSPITAL_COMMUNITY): Payer: Self-pay | Admitting: Emergency Medicine

## 2019-04-19 ENCOUNTER — Other Ambulatory Visit: Payer: Self-pay

## 2019-04-19 ENCOUNTER — Emergency Department (HOSPITAL_COMMUNITY)
Admission: EM | Admit: 2019-04-19 | Discharge: 2019-04-19 | Disposition: A | Discharge status: Home / Self Care | Payer: Self-pay | Attending: Emergency Medicine | Admitting: Emergency Medicine

## 2019-04-19 ENCOUNTER — Emergency Department (HOSPITAL_COMMUNITY): Payer: Self-pay

## 2019-04-19 DIAGNOSIS — D649 Anemia, unspecified: Secondary | ICD-10-CM | POA: Insufficient documentation

## 2019-04-19 DIAGNOSIS — D5700 Hb-SS disease with crisis, unspecified: Secondary | ICD-10-CM | POA: Insufficient documentation

## 2019-04-19 DIAGNOSIS — R112 Nausea with vomiting, unspecified: Secondary | ICD-10-CM | POA: Insufficient documentation

## 2019-04-19 DIAGNOSIS — F1290 Cannabis use, unspecified, uncomplicated: Secondary | ICD-10-CM | POA: Insufficient documentation

## 2019-04-19 LAB — CBC WITH DIFFERENTIAL/PLATELET
Abs Immature Granulocytes: 0.26 10*3/uL — ABNORMAL HIGH (ref 0.00–0.07)
Basophils Absolute: 0 10*3/uL (ref 0.0–0.1)
Basophils Relative: 0 %
Eosinophils Absolute: 0 10*3/uL (ref 0.0–0.5)
Eosinophils Relative: 0 %
HCT: 22.6 % — ABNORMAL LOW (ref 39.0–52.0)
Hemoglobin: 7.7 g/dL — ABNORMAL LOW (ref 13.0–17.0)
Immature Granulocytes: 1 %
Lymphocytes Relative: 6 %
Lymphs Abs: 1.3 10*3/uL (ref 0.7–4.0)
MCH: 32.1 pg (ref 26.0–34.0)
MCHC: 34.1 g/dL (ref 30.0–36.0)
MCV: 94.2 fL (ref 80.0–100.0)
Monocytes Absolute: 2 10*3/uL — ABNORMAL HIGH (ref 0.1–1.0)
Monocytes Relative: 10 %
Neutro Abs: 16.2 10*3/uL — ABNORMAL HIGH (ref 1.7–7.7)
Neutrophils Relative %: 83 %
Platelets: 358 10*3/uL (ref 150–400)
RBC: 2.4 MIL/uL — ABNORMAL LOW (ref 4.22–5.81)
RDW: 26.5 % — ABNORMAL HIGH (ref 11.5–15.5)
WBC: 19.8 10*3/uL — ABNORMAL HIGH (ref 4.0–10.5)
nRBC: 8.3 % — ABNORMAL HIGH (ref 0.0–0.2)

## 2019-04-19 LAB — URINALYSIS, ROUTINE W REFLEX MICROSCOPIC
Bacteria, UA: NONE SEEN
Bilirubin Urine: NEGATIVE
Glucose, UA: NEGATIVE mg/dL
Hgb urine dipstick: NEGATIVE
Ketones, ur: NEGATIVE mg/dL
Leukocytes,Ua: NEGATIVE
Nitrite: NEGATIVE
Protein, ur: 30 mg/dL — AB
Specific Gravity, Urine: 1.011 (ref 1.005–1.030)
pH: 6 (ref 5.0–8.0)

## 2019-04-19 LAB — COMPREHENSIVE METABOLIC PANEL
ALT: 18 U/L (ref 0–44)
AST: 44 U/L — ABNORMAL HIGH (ref 15–41)
Albumin: 4.2 g/dL (ref 3.5–5.0)
Alkaline Phosphatase: 149 U/L — ABNORMAL HIGH (ref 38–126)
Anion gap: 10 (ref 5–15)
BUN: 14 mg/dL (ref 6–20)
CO2: 23 mmol/L (ref 22–32)
Calcium: 9.3 mg/dL (ref 8.9–10.3)
Chloride: 103 mmol/L (ref 98–111)
Creatinine, Ser: 1.01 mg/dL (ref 0.61–1.24)
GFR calc Af Amer: >60 mL/min (ref >60)
GFR calc non Af Amer: >60 mL/min (ref >60)
Glucose, Bld: 110 mg/dL — ABNORMAL HIGH (ref 70–99)
Potassium: 3.6 mmol/L (ref 3.5–5.1)
Sodium: 136 mmol/L (ref 135–145)
Total Bilirubin: 3.6 mg/dL — ABNORMAL HIGH (ref 0.3–1.2)
Total Protein: 7.7 g/dL (ref 6.5–8.1)

## 2019-04-19 LAB — RETICULOCYTES
Immature Retic Fract: 37.9 % — ABNORMAL HIGH (ref 2.3–15.9)
RBC.: 2.4 MIL/uL — ABNORMAL LOW (ref 4.22–5.81)
Retic Count, Absolute: 444.5 10*3/uL — ABNORMAL HIGH (ref 19.0–186.0)
Retic Ct Pct: 18.1 % — ABNORMAL HIGH (ref 0.4–3.1)

## 2019-04-19 MED ORDER — HYDROMORPHONE HCL 1 MG/ML IJ SOLN
2.0000 mg | INTRAMUSCULAR | Status: AC
  Administered 2019-04-19: 2 mg via SUBCUTANEOUS

## 2019-04-19 MED ORDER — SODIUM CHLORIDE 0.45 % IV SOLN
INTRAVENOUS | Status: DC
  Administered 2019-04-19: 09:00:00 via INTRAVENOUS

## 2019-04-19 MED ORDER — HYDROMORPHONE HCL 1 MG/ML IJ SOLN
2.0000 mg | INTRAMUSCULAR | Status: AC
  Filled 2019-04-19: qty 2

## 2019-04-19 MED ORDER — HYDROMORPHONE HCL 1 MG/ML IJ SOLN: 2.0000 mg | INTRAMUSCULAR | Status: AC

## 2019-04-19 MED ORDER — HYDROMORPHONE HCL 1 MG/ML IJ SOLN
2.0000 mg | INTRAMUSCULAR | Status: DC
  Administered 2019-04-19: 2 mg via INTRAVENOUS
  Filled 2019-04-19: qty 2

## 2019-04-19 MED ORDER — HYDROMORPHONE HCL 1 MG/ML IJ SOLN: 2.0000 mg | INTRAMUSCULAR | Status: DC

## 2019-04-19 MED ORDER — HYDROMORPHONE HCL 1 MG/ML IJ SOLN
2.0000 mg | INTRAMUSCULAR | Status: AC
  Administered 2019-04-19: 2 mg via INTRAVENOUS
  Filled 2019-04-19: qty 2

## 2019-04-19 MED ORDER — ONDANSETRON HCL 4 MG/2ML IJ SOLN: 4.0000 mg | INTRAMUSCULAR | Status: DC | PRN

## 2019-04-19 MED ORDER — KETOROLAC TROMETHAMINE 30 MG/ML IJ SOLN
30.0000 mg | INTRAMUSCULAR | Status: AC
  Administered 2019-04-19: 30 mg via INTRAVENOUS
  Filled 2019-04-19: qty 1

## 2019-04-19 NOTE — ED Provider Notes (Signed)
[facility] Provider Note   CSN: [id number] Arrival date & time: 04/19/19  0747    History   Chief Complaint Chief Complaint  Patient presents with  . Sickle Cell Pain Crisis    HPI Jerry [name] is a 33 y.o. male presenting for evaluation of sickle cell pain.  Patient states yesterday he started develop pain in his low back, hips, buttocks, and upper legs.  Pain is severe and constant.  He took his home medication including ibuprofen and oxycodone without improvement of symptoms.  He reports associated nausea, has thrown up twice.  He denies fevers, chills, chest pain, shortness of breath, abdominal pain, abnormal urination, abnormal bowel movements.  Patient states his normal sickle pain is in his upper back.  Otherwise, sxs are consistent with previous crisis.  Patient takes hydroxyurea and folic acid daily, has been taking them as prescribed.  He has no other medical problems other than sickle cell.  He denies increased pain with urination or bowel movements.  He reports increased pain with movement and palpation.     HPI  Past Medical History:  Diagnosis Date  . Sickle cell anemia [facility](HCC)     Patient Active Problem List   Diagnosis Date Noted  . 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  . Numbness and tingling of right face   . Leukocytosis   . Sickle cell pain crisis (HCC) 11/11/2017  . Vitamin D deficiency 04/26/2017  . Problems related to release from prison 01/09/2017  . 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 SICKLE CELL ANEMIA 02/15/2007  . TOBACCO ABUSE 02/15/2007  . MARIJUANA ABUSE 02/15/2007  . 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 No pertinent surgical history.      Home Medications    Prior to Admission medications   Medication Sig Start Date End Date Taking? Authorizing Provider  folic acid (FOLVITE) 1 MG tablet Take 1 tablet (1 mg total) by mouth daily. 04/14/19  Yes Mike Gipouglas, Andre, FNP   hydroxyurea (HYDREA) 500 MG capsule Take 3 capsules (1,500 mg total) by mouth daily. May take with food to minimize GI side effects. 04/14/19  Yes Mike Gipouglas, Andre, FNP  ibuprofen (ADVIL) 800 MG tablet Take 1 tablet (800 mg total) by mouth 3 (three) times daily. 03/14/19  Yes Mike Gipouglas, Andre, FNP  Oxycodone HCl 10 MG TABS Take 1 tablet (10 mg total) by mouth every 6 (six) hours as needed for up to 15 days. 04/15/19 04/30/19 Yes Mike Gipouglas, Andre, FNP    Family History No family history on file.  Social History Social History   Tobacco Use  . Smoking status: Never Smoker  . Smokeless tobacco: Never Used  Substance Use Topics  . Alcohol use: No  . Drug use: Yes    Types: Marijuana    Comment: occasionally     Allergies   Patient has no known allergies.   Review of Systems Review of Systems  Gastrointestinal: Positive for nausea and vomiting.  Musculoskeletal: Positive for arthralgias and myalgias.  All other systems reviewed and are negative.    Physical Exam Updated Vital Signs BP 137/76   Pulse 97   Temp 99.2 F (37.3 C) (Oral)   Resp (!) 25   Ht 5\' 11"  (1.803 m)   Wt 86.2 kg   SpO2 92%   BMI 26.50 kg/m   Physical Exam Vitals signs and nursing note reviewed.  Constitutional:      General: He is not in acute distress.  Appearance: He is well-developed.     Comments: Appears uncomfortable due to pain, otherwise nontoxic  HENT:     Head: Normocephalic and atraumatic.  Eyes:     Extraocular Movements: Extraocular movements intact.     Conjunctiva/sclera: Conjunctivae normal.     Pupils: Pupils are equal, round, and reactive to light.  Neck:     Musculoskeletal: Normal range of motion and neck supple.  Cardiovascular:     Rate and Rhythm: Regular rhythm. Tachycardia present.     Pulses: Normal pulses.     Comments: HR ~105 Pulmonary:     Effort: Pulmonary effort is normal. No respiratory distress.     Breath sounds: Normal breath sounds. No wheezing.  Abdominal:      General: There is no distension.     Palpations: Abdomen is soft. There is no mass.     Tenderness: There is no abdominal tenderness. There is no guarding or rebound.  Musculoskeletal: Normal range of motion.  Skin:    General: Skin is warm and dry.     Capillary Refill: Capillary refill takes less than 2 seconds.  Neurological:     Mental Status: He is alert and oriented to person, place, and time.  Psychiatric:        Mood and Affect: Mood normal.      ED Treatments / Results  Labs (all labs ordered are listed, but only abnormal results are displayed) Labs Reviewed  CBC WITH DIFFERENTIAL/PLATELET - Abnormal; Notable for the following components:      Result Value   WBC 19.8 (*)    RBC 2.40 (*)    Hemoglobin 7.7 (*)    HCT 22.6 (*)    RDW 26.5 (*)    nRBC 8.3 (*)    Neutro Abs 16.2 (*)    Monocytes Absolute 2.0 (*)    Abs Immature Granulocytes 0.26 (*)    All other components within normal limits  RETICULOCYTES - Abnormal; Notable for the following components:   Retic Ct Pct 18.1 (*)    RBC. 2.40 (*)    Retic Count, Absolute 444.5 (*)    Immature Retic Fract 37.9 (*)    All other components within normal limits  COMPREHENSIVE METABOLIC PANEL - Abnormal; Notable for the following components:   Glucose, Bld 110 (*)    AST 44 (*)    Alkaline Phosphatase 149 (*)    Total Bilirubin 3.6 (*)    All other components within normal limits  URINALYSIS, ROUTINE W REFLEX MICROSCOPIC - Abnormal; Notable for the following components:   Protein, ur 30 (*)    All other components within normal limits    EKG None  Radiology Dg Hips Bilat W Or Wo Pelvis 3-4 Views  Result Date: 04/19/2019 CLINICAL DATA:  Bilateral pelvic pain.  Sickle cell crisis EXAM: DG HIP (WITH OR WITHOUT PELVIS) 3-4V BILAT COMPARISON:  None. FINDINGS: There is no evidence of hip fracture or dislocation. There is no evidence of arthropathy or other focal bone abnormality. IMPRESSION: Negative.  Electronically Signed   By: Marlan Palau M.D.   On: 04/19/2019 09:48    Procedures Procedures (including critical care time)  Medications Ordered in ED Medications  HYDROmorphone (DILAUDID) injection 2 mg (has no administration in time range)    Or  HYDROmorphone (DILAUDID) injection 2 mg (has no administration in time range)  HYDROmorphone (DILAUDID) injection 2 mg (2 mg Intravenous Given 04/19/19 1051)    Or  HYDROmorphone (DILAUDID) injection 2 mg ( Subcutaneous  See Alternative 04/19/19 1051)  ondansetron (ZOFRAN) injection 4 mg (has no administration in time range)  0.45 % sodium chloride infusion ( Intravenous New Bag/Given 04/19/19 0844)  ketorolac (TORADOL) 30 MG/ML injection 30 mg (30 mg Intravenous Given 04/19/19 0834)  HYDROmorphone (DILAUDID) injection 2 mg ( Intravenous See Alternative 04/19/19 0820)    Or  HYDROmorphone (DILAUDID) injection 2 mg (2 mg Subcutaneous Given 04/19/19 0820)  HYDROmorphone (DILAUDID) injection 2 mg (2 mg Intravenous Given 04/19/19 0905)    Or  HYDROmorphone (DILAUDID) injection 2 mg ( Subcutaneous See Alternative 04/19/19 0905)     Initial Impression / Assessment and Plan / ED Course  I have reviewed the triage vital signs and the nursing notes.  Pertinent labs & imaging results that were available during my care of the patient were reviewed by me and considered in my medical decision making (see chart for details).        Patient presenting for evaluation of sickle cell pain crisis.  Physical exam shows patient appears uncomfortable, but otherwise nontoxic.  No chest pain, shortness of breath, fever, low suspicion for acute chest.  Patient is mildly tachycardic, I do think this is due to pain.  Will obtain labs and urine for further evaluation.  Dilaudid for pain.  Will obtain x-ray to rule out signs of AVN or bony abnormalities.  X-rays interpreted by me, no fracture, dislocation, or concerning bony abnormalities.  On reassessment after second  evaluated, patient appears much more comfortable.  States pain is improving, but has not resolved completely.  Labs pending.  Labs show mild leukocytosis at 19.8.  Likely secondary to pain.  Hemoglobin low at 7.7, however not far from patient's baseline.  Platelet count elevated, patient is very slightly elevated, but this is been present before.  He is not having any right upper quadrant abdominal pain, nausea, vomiting, fevers.  Low suspicion for gallbladder infection.  After further pain medication, patient states he is feeling improved.  Encourage patient to follow-up with his PCP to get labs rechecked.  At this time, patient appears safe for discharge.  Return precautions given.  Patient states he understands and agrees plan.  Final Clinical Impressions(s) / ED Diagnoses   Final diagnoses:  Sickle cell pain crisis (HCC)  Anemia, unspecified type    ED Discharge Orders    None       Alveria Apley, PA-C 04/19/19 1128    Margarita Grizzle, MD 04/24/19 [phone]

## 2019-04-19 NOTE — Discharge Instructions (Addendum)
Continue taking home medications as prescribed. Use ibuprofen and your home oxycodone as needed for pain. Call your primary care doctor to schedule appointment for early next week to recheck your blood work. Return to the emergency room if you develop fevers, difficulty breathing, or any new, worsening, concerning symptoms.

## 2019-04-19 NOTE — ED Notes (Deleted)
Called [facility] for update on pt transport to [facility]. Spoke with Cala Bradford from [facility]

## 2019-04-19 NOTE — ED Notes (Signed)
ED Provider at bedside. 

## 2019-04-19 NOTE — ED Notes (Signed)
Spoke to pt's wife, Mrs. Ocana at [phone] and gave update on her husband. Informed her he will likely be d/c after lab results come back per [place], [place].

## 2019-04-19 NOTE — ED Notes (Signed)
Patient verbalizes understanding of discharge instructions. Opportunity for questioning and answers were provided. Armband removed by staff, pt discharged from ED.  

## 2019-04-19 NOTE — ED Triage Notes (Signed)
Pt in with c/o sickle cell crisis, pain mostly in the bilateral back region. Denies any sob

## 2019-04-19 NOTE — ED Notes (Signed)
Patient transported to X-ray 

## 2019-05-07 ENCOUNTER — Telehealth: Payer: Self-pay

## 2019-05-07 DIAGNOSIS — D571 Sickle-cell disease without crisis: Secondary | ICD-10-CM

## 2019-05-07 MED FILL — IBUPROFEN 800 MG TABLET: 800 | 10 days supply | Qty: 30 | Fill #0

## 2019-05-07 MED FILL — FOLIC ACID 1 MG TABS: 1 | 90 days supply | Qty: 90 | Fill #0

## 2019-05-07 MED FILL — HYDROXYUREA 500 MG CAPSULE: 500 | 30 days supply | Qty: 90 | Fill #0

## 2019-05-07 NOTE — Telephone Encounter (Signed)
Refill request for oxycodone. Patient states he is out, I reminded him of our narcotics policy.

## 2019-05-08 ENCOUNTER — Other Ambulatory Visit: Payer: Self-pay | Admitting: Family Medicine

## 2019-05-08 DIAGNOSIS — D571 Sickle-cell disease without crisis: Secondary | ICD-10-CM

## 2019-05-09 ENCOUNTER — Other Ambulatory Visit: Payer: Self-pay

## 2019-05-09 ENCOUNTER — Ambulatory Visit (INDEPENDENT_AMBULATORY_CARE_PROVIDER_SITE_OTHER): Payer: Self-pay | Admitting: Family Medicine

## 2019-05-09 ENCOUNTER — Encounter: Payer: Self-pay | Admitting: Family Medicine

## 2019-05-09 VITALS — BP 112/59 | HR 77 | Temp 98.5°F | Resp 16 | Ht 71.0 in | Wt 175.0 lb

## 2019-05-09 DIAGNOSIS — R801 Persistent proteinuria, unspecified: Secondary | ICD-10-CM

## 2019-05-09 DIAGNOSIS — D571 Sickle-cell disease without crisis: Secondary | ICD-10-CM

## 2019-05-09 LAB — POCT URINALYSIS DIPSTICK
Bilirubin, UA: NEGATIVE
Blood, UA: NEGATIVE
Glucose, UA: NEGATIVE
Ketones, UA: NEGATIVE
Leukocytes, UA: NEGATIVE
Nitrite, UA: NEGATIVE
Protein, UA: POSITIVE — AB
Spec Grav, UA: 1.02 (ref 1.010–1.025)
Urobilinogen, UA: 0.2 E.U./dL
pH, UA: 6 (ref 5.0–8.0)

## 2019-05-09 MED ORDER — OXYCODONE HCL 10 MG PO TABS: 10.0000 mg | ORAL_TABLET | Freq: Four times a day (QID) | ORAL | 0 refills | Status: DC | PRN

## 2019-05-09 NOTE — Patient Instructions (Signed)
Sickle Cell Anemia, Adult °Sickle cell anemia is a condition where your red blood cells are shaped like sickles. Red blood cells carry oxygen through the body. Sickle-shaped cells do not live as long as normal red blood cells. They also clump together and block blood from flowing through the blood vessels. This prevents the body from getting enough oxygen. Sickle cell anemia causes organ damage and pain. It also increases the risk of infection. °Follow these instructions at home: °Medicines °· Take over-the-counter and prescription medicines only as told by your doctor. °· If you were prescribed an antibiotic medicine, take it as told by your doctor. Do not stop taking the antibiotic even if you start to feel better. °· If you develop a fever, do not take medicines to lower the fever right away. Tell your doctor about the fever. °Managing pain, stiffness, and swelling °· Try these methods to help with pain: °? Use a heating pad. °? Take a warm bath. °? Distract yourself, such as by watching TV. °Eating and drinking °· Drink enough fluid to keep your pee (urine) clear or pale yellow. Drink more in hot weather and during exercise. °· Limit or avoid alcohol. °· Eat a healthy diet. Eat plenty of fruits, vegetables, whole grains, and lean protein. °· Take vitamins and supplements as told by your doctor. °Traveling °· When traveling, keep these with you: °? Your medical information. °? The names of your doctors. °? Your medicines. °· If you need to take an airplane, talk to your doctor first. °Activity °· Rest often. °· Avoid exercises that make your heart beat much faster, such as jogging. °General instructions °· Do not use products that have nicotine or tobacco, such as cigarettes and e-cigarettes. If you need help quitting, ask your doctor. °· Consider wearing a medical alert bracelet. °· Avoid being in high places (high altitudes), such as mountains. °· Avoid very hot or cold temperatures. °· Avoid places where the  temperature changes a lot. °· Keep all follow-up visits as told by your doctor. This is important. °Contact a doctor if: °· A joint hurts. °· Your feet or hands hurt or swell. °· You feel tired (fatigued). °Get help right away if: °· You have symptoms of infection. These include: °? Fever. °? Chills. °? Being very tired. °? Irritability. °? Poor eating. °? Throwing up (vomiting). °· You feel dizzy or faint. °· You have new stomach pain, especially on the left side. °· You have a an erection (priapism) that lasts more than 4 hours. °· You have numbness in your arms or legs. °· You have a hard time moving your arms or legs. °· You have trouble talking. °· You have pain that does not go away when you take medicine. °· You are short of breath. °· You are breathing fast. °· You have a long-term cough. °· You have pain in your chest. °· You have a bad headache. °· You have a stiff neck. °· Your stomach looks bloated even though you did not eat much. °· Your skin is pale. °· You suddenly cannot see well. °Summary °· Sickle cell anemia is a condition where your red blood cells are shaped like sickles. °· Follow your doctor's advice on ways to manage pain, food to eat, activities to do, and steps to take for safe travel. °· Get medical help right away if you have any signs of infection, such as a fever. °This information is not intended to replace advice given to you by your   health care provider. Make sure you discuss any questions you have with your health care provider. °Document Released: 09/03/2013 Document Revised: 12/19/2016 Document Reviewed: 12/19/2016 °Elsevier Interactive Patient Education © 2019 [employer] ° °

## 2019-05-09 NOTE — Progress Notes (Signed)
[facility] INTERNAL MEDICINE AND SICKLE CELL CARE  SICKLE CELL ANEMIA FOLLOW UP VISIT PROVIDER: Mike GipAndre Clemons Salvucci, FNP    Subjective:   Jerry [name]  is a 33 y.o.  male who  has a past medical history of Sickle cell anemia (HCC). presents for a follow up for Sickle Cell Anemia. The patient has had 2 admissions in the past 6 months.  Pain regimen includes: Ibuprofen and oxycodone 10 mg Hydrea Therapy: Yes Medication compliance: Yes  Pain today is 0/10 . The patient reports adequate daily hydration.   Review of Systems  Constitutional: Negative.   HENT: Negative.   Eyes: Negative.   Respiratory: Negative.   Cardiovascular: Negative.   Gastrointestinal: Negative.   Genitourinary: Negative.   Musculoskeletal: Negative.   Skin: Negative.   Neurological: Negative.   Psychiatric/Behavioral: Negative.     Objective:   Objective  BP (!) 112/59 (BP Location: Right Arm, Patient Position: Sitting, Cuff Size: Large)   Pulse 77   Temp 98.5 F (36.9 C) (Oral)   Resp 16   Ht 5\' 11"  (1.803 m)   Wt 175 lb (79.4 kg)   SpO2 97%   BMI 24.41 kg/m   Wt Readings from Last 3 Encounters:  05/09/19 175 lb (79.4 kg)  04/19/19 190 lb (86.2 kg)  01/17/19 185 lb (83.9 kg)     Physical Exam Vitals signs and nursing note reviewed.  Constitutional:      General: He is not in acute distress.    Appearance: Normal appearance.  HENT:     Head: Normocephalic and atraumatic.  Eyes:     Extraocular Movements: Extraocular movements intact.     Conjunctiva/sclera: Conjunctivae normal.     Pupils: Pupils are equal, round, and reactive to light.  Cardiovascular:     Rate and Rhythm: Normal rate and regular rhythm.     Heart sounds: No murmur.  Pulmonary:     Effort: Pulmonary effort is normal.     Breath sounds: Normal breath sounds.  Musculoskeletal: Normal range of motion.  Skin:    General: Skin is warm and dry.  Neurological:     Mental Status: He is alert and oriented  to person, place, and time.  Psychiatric:        Mood and Affect: Mood normal.        Behavior: Behavior normal.        Thought Content: Thought content normal.        Judgment: Judgment normal.      Assessment/Plan:   Assessment   Encounter Diagnosis  Name Primary?  Marland Kitchen. 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 Hb-SS disease without crisis (HCC) - Urinalysis Dipstick - CBC with Differential - Comprehensive metabolic panel  2. Persistent proteinuria Start lisinopril 2.5 mg. Will monitor, if continues, will refer to nephrologist for further evaluation.  - lisinopril (ZESTRIL) 5 MG tablet; Take 0.5 tablets (2.5 mg total) by mouth daily.  Dispense: 45 tablet; Refill: 3   Return to care as scheduled and prn. Patient verbalized understanding and agreed with plan of care.   1. Sickle cell disease - Continue Hydrea   We discussed the need for good hydration, monitoring of hydration status, avoidance of heat, cold, stress, and infection triggers. We discussed the risks and benefits of Hydrea, including bone marrow suppression, the possibility of GI upset, skin ulcers, hair thinning, and teratogenicity. The patient was reminded of the need to seek medical attention of any symptoms  of bleeding, anemia, or infection. Continue folic acid 1 mg daily to prevent aplastic bone marrow crises.   2. Pulmonary evaluation - Patient denies severe recurrent wheezes, shortness of breath with exercise, or persistent cough. If these symptoms develop, pulmonary function tests with spirometry will be ordered, and if abnormal, plan on referral to Pulmonology for further evaluation.  3. Cardiac - Routine screening for pulmonary hypertension is not recommended.  4. Eye - High risk of proliferative retinopathy. Annual eye exam with retinal exam recommended to patient.  5. Immunization status -  Yearly influenza vaccination is recommended, as well as being up to date with Meningococcal and Pneumococcal  vaccines.   6. Acute and chronic painful episodes - We discussed that pt is to receive Schedule II prescriptions only from [place]. Pt is also aware that the prescription history is available to [place] online through the [facility] CSRS. Controlled substance agreement signed. We reminded Jerry [name] that all patients receiving Schedule II narcotics must be seen for follow within one month of prescription being requested. We reviewed the terms of our pain agreement, including the need to keep medicines in a safe locked location away from children or pets, and the need to report excess sedation or constipation, measures to avoid constipation, and policies related to early refills and stolen prescriptions. According to the [place] [facility] program, we have reviewed details related to analgesia, adverse effects, aberrant behaviors.  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  Not applicable at this time.  If this occurs will use Exjade for management.   8. Vitamin D deficiency - Drisdol 50,000 units weekly. Patient encouraged to take as prescribed.   The above recommendations are taken from the NIH Evidence-Based Management of Sickle Cell Disease: Expert Panel Report, 20149.   Ms. Andr L. Nathaneil Canary, FNP-BC [facility] [address] [place],  [address] [phone]  This note has been created with Dragon speech recognition software and smart phrase technology. Any transcriptional errors are unintentional.

## 2019-05-10 LAB — COMPREHENSIVE METABOLIC PANEL
ALT: 12 IU/L (ref 0–44)
AST: 30 IU/L (ref 0–40)
Albumin/Globulin Ratio: 1.4 (ref 1.2–2.2)
Albumin: 4.8 g/dL (ref 4.1–5.2)
Alkaline Phosphatase: 129 IU/L — ABNORMAL HIGH (ref 39–117)
BUN/Creatinine Ratio: 12 (ref 9–20)
BUN: 11 mg/dL (ref 6–20)
Bilirubin Total: 2.1 mg/dL — ABNORMAL HIGH (ref 0.0–1.2)
CO2: 23 mmol/L (ref 20–29)
Calcium: 9.9 mg/dL (ref 8.7–10.2)
Chloride: 103 mmol/L (ref 96–106)
Creatinine, Ser: 0.94 mg/dL (ref 0.76–1.27)
GFR calc Af Amer: 125 mL/min/{1.73_m2} (ref >59)
GFR calc non Af Amer: 108 mL/min/{1.73_m2} (ref >59)
Globulin, Total: 3.4 g/dL (ref 1.5–4.5)
Glucose: 88 mg/dL (ref 65–99)
Potassium: 4.9 mmol/L (ref 3.5–5.2)
Sodium: 140 mmol/L (ref 134–144)
Total Protein: 8.2 g/dL (ref 6.0–8.5)

## 2019-05-10 LAB — CBC WITH DIFFERENTIAL/PLATELET
Basophils Absolute: 0.1 10*3/uL (ref 0.0–0.2)
Basos: 1 %
EOS (ABSOLUTE): 0.3 10*3/uL (ref 0.0–0.4)
Eos: 3 %
Hematocrit: 29.7 % — ABNORMAL LOW (ref 37.5–51.0)
Hemoglobin: 9.6 g/dL — ABNORMAL LOW (ref 13.0–17.7)
Immature Grans (Abs): 0 10*3/uL (ref 0.0–0.1)
Immature Granulocytes: 1 %
Lymphocytes Absolute: 2.5 10*3/uL (ref 0.7–3.1)
Lymphs: 29 %
MCH: 30.1 pg (ref 26.6–33.0)
MCHC: 32.3 g/dL (ref 31.5–35.7)
MCV: 93 fL (ref 79–97)
Monocytes Absolute: 1.3 10*3/uL — ABNORMAL HIGH (ref 0.1–0.9)
Monocytes: 14 %
NRBC: 1 % — ABNORMAL HIGH (ref 0–0)
Neutrophils Absolute: 4.6 10*3/uL (ref 1.4–7.0)
Neutrophils: 52 %
Platelets: 614 10*3/uL — ABNORMAL HIGH (ref 150–450)
RBC: 3.19 x10E6/uL — ABNORMAL LOW (ref 4.14–5.80)
RDW: 21 % — ABNORMAL HIGH (ref 11.6–15.4)
WBC: 8.7 10*3/uL (ref 3.4–10.8)

## 2019-05-12 ENCOUNTER — Telehealth: Payer: Self-pay

## 2019-05-12 MED ORDER — LISINOPRIL 5 MG PO TABS: 2.5000 mg | ORAL_TABLET | Freq: Every day | ORAL | 3 refills | Status: DC

## 2019-05-12 NOTE — Telephone Encounter (Signed)
-----   Message from Lanae Boast, [place] sent at 05/12/2019  3:06 PM EDT ----- Please let the pateint know that there is a small amount of protein in the urine. I would like to add lisinopril 2.5 mg in order to protect his kidney function. He will take this medication everyday. The major side effect of this medication is a dry cough. If the cough occurs, stop the medication and the cough will stop. Call the office to report if this happens.

## 2019-05-12 NOTE — Telephone Encounter (Signed)
Called, no answer and no voicemail.  

## 2019-05-13 NOTE — Telephone Encounter (Signed)
Called and spoke with patient. Advised that there was protein in urine and we would like him to start Lisinopril 2.5mg  to help protect his kidney function. He states he would like to speak to the provider before starting this. I have transferred him to the front desk to make a phone visit for this. Thanks!

## 2019-05-14 ENCOUNTER — Other Ambulatory Visit: Payer: Self-pay

## 2019-05-14 ENCOUNTER — Telehealth: Payer: Self-pay | Admitting: Family Medicine

## 2019-05-14 ENCOUNTER — Ambulatory Visit: Payer: Self-pay | Admitting: Family Medicine

## 2019-05-19 ENCOUNTER — Ambulatory Visit: Payer: Self-pay

## 2019-05-28 NOTE — Telephone Encounter (Signed)
See notes

## 2019-06-16 ENCOUNTER — Telehealth: Payer: Self-pay

## 2019-06-16 DIAGNOSIS — D571 Sickle-cell disease without crisis: Secondary | ICD-10-CM

## 2019-06-16 MED ORDER — FOLIC ACID 1 MG PO TABS: 1.0000 mg | ORAL_TABLET | Freq: Every day | ORAL | 3 refills | Status: DC

## 2019-06-16 MED ORDER — IBUPROFEN 800 MG PO TABS: ORAL_TABLET | ORAL | 0 refills | Status: DC

## 2019-06-16 MED ORDER — HYDROXYUREA 500 MG PO CAPS: 1500.0000 mg | ORAL_CAPSULE | Freq: Every day | ORAL | 3 refills | Status: DC

## 2019-06-16 NOTE — Telephone Encounter (Signed)
Refills sent in to [facility] 

## 2019-06-17 ENCOUNTER — Other Ambulatory Visit: Payer: Self-pay | Admitting: Internal Medicine

## 2019-06-17 ENCOUNTER — Other Ambulatory Visit: Payer: Self-pay

## 2019-06-17 ENCOUNTER — Telehealth: Payer: Self-pay

## 2019-06-17 ENCOUNTER — Ambulatory Visit (INDEPENDENT_AMBULATORY_CARE_PROVIDER_SITE_OTHER): Payer: Self-pay

## 2019-06-17 DIAGNOSIS — D571 Sickle-cell disease without crisis: Secondary | ICD-10-CM

## 2019-06-17 DIAGNOSIS — Z23 Encounter for immunization: Secondary | ICD-10-CM

## 2019-06-17 MED ORDER — OXYCODONE HCL 10 MG PO TABS: 10.0000 mg | ORAL_TABLET | Freq: Four times a day (QID) | ORAL | 0 refills | Status: DC | PRN

## 2019-06-17 MED ORDER — FOLIC ACID 1 MG PO TABS: 1.0000 mg | ORAL_TABLET | Freq: Every day | ORAL | 3 refills | Status: DC

## 2019-06-17 MED ORDER — HYDROXYUREA 500 MG PO CAPS: 1500.0000 mg | ORAL_CAPSULE | Freq: Every day | ORAL | 3 refills | Status: DC

## 2019-06-17 MED ORDER — IBUPROFEN 800 MG PO TABS: ORAL_TABLET | ORAL | 0 refills | Status: DC

## 2019-06-17 MED FILL — IBUPROFEN 800 MG TABLET: 800 | 7 days supply | Qty: 21 | Fill #0

## 2019-06-17 MED FILL — HYDROXYUREA 500 MG CAPSULE: 500 | 30 days supply | Qty: 90 | Fill #0

## 2019-06-17 NOTE — Telephone Encounter (Signed)
Refilled

## 2019-06-17 NOTE — Telephone Encounter (Signed)
[facility] Thanks!  

## 2019-07-04 ENCOUNTER — Other Ambulatory Visit: Payer: Self-pay

## 2019-07-04 ENCOUNTER — Ambulatory Visit: Payer: Self-pay | Admitting: Family Medicine

## 2019-07-04 ENCOUNTER — Encounter: Payer: Self-pay | Admitting: Family Medicine

## 2019-07-04 ENCOUNTER — Ambulatory Visit (INDEPENDENT_AMBULATORY_CARE_PROVIDER_SITE_OTHER): Payer: Self-pay | Admitting: Family Medicine

## 2019-07-04 VITALS — BP 112/57 | HR 89 | Temp 98.3°F | Resp 14 | Ht 71.0 in | Wt 178.0 lb

## 2019-07-04 DIAGNOSIS — D571 Sickle-cell disease without crisis: Secondary | ICD-10-CM

## 2019-07-04 DIAGNOSIS — R801 Persistent proteinuria, unspecified: Secondary | ICD-10-CM

## 2019-07-04 DIAGNOSIS — R5383 Other fatigue: Secondary | ICD-10-CM

## 2019-07-04 LAB — POCT URINALYSIS DIPSTICK
Bilirubin, UA: NEGATIVE
Glucose, UA: NEGATIVE
Ketones, UA: NEGATIVE
Leukocytes, UA: NEGATIVE
Nitrite, UA: NEGATIVE
Protein, UA: POSITIVE — AB
Spec Grav, UA: 1.02 (ref 1.010–1.025)
Urobilinogen, UA: 1 E.U./dL
pH, UA: 5.5 (ref 5.0–8.0)

## 2019-07-04 MED ORDER — HYDROXYUREA 500 MG PO CAPS: 1500.0000 mg | ORAL_CAPSULE | Freq: Every day | ORAL | 3 refills | Status: DC

## 2019-07-04 MED ORDER — FOLIC ACID 1 MG PO TABS: 1.0000 mg | ORAL_TABLET | Freq: Every day | ORAL | 3 refills | Status: DC

## 2019-07-04 MED ORDER — LISINOPRIL 2.5 MG PO TABS: 2.5000 mg | ORAL_TABLET | Freq: Every day | ORAL | 1 refills | Status: DC

## 2019-07-04 NOTE — Patient Instructions (Addendum)
Some of the herbal supplements that can affect your mood are 5HT, ashwaganda, and  Sam-E. The FDA has not approved them for depression and anxiety, but some studies have shown that these medications have some benefit. I recommend that you do your research before trying these out.  African American Male Therapists: Jerry SchoolShawn [name] [facility] [address] [place], [place] [address] [phone]  Jerry ForesterDontae [name] [facility] [place], [place] [address] [phone]  Jerry Chancewen [facility] [address] [place], [place] [address] [phone] Helpful websites include: BudgetManiac.sihttps://blackmenheal.org/ [place]iseTaxis.siHttps://therapyforblackmen.org/  Mindfulness-Based Stress Reduction Mindfulness-based stress reduction (MBSR) is a program that helps people learn to practice mindfulness. Mindfulness is the practice of intentionally paying attention to the present moment. It can be learned and practiced through techniques such as education, breathing exercises, meditation, and yoga. MBSR includes several mindfulness techniques in one program. MBSR works best when you understand the treatment, are willing to try new things, and can commit to spending time practicing what you learn. MBSR training may include learning about:  How your emotions, thoughts, and reactions affect your body.  New ways to respond to things that cause negative thoughts to start (triggers).  How to notice your thoughts and let go of them.  Practicing awareness of everyday things that you normally do without thinking.  The techniques and goals of different types of meditation. What are the benefits of MBSR? MBSR can have many benefits, which include helping you to:  Develop self-awareness. This refers to knowing and understanding yourself.  Learn skills and attitudes that help you to participate in your own health care.  Learn new ways to care for yourself.  Be more accepting about how things  are, and let things go.  Be less judgmental and approach things with an open mind.  Be patient with yourself and trust yourself more. MBSR has also been shown to:  Reduce negative emotions, such as depression and anxiety.  Improve memory and focus.  Change how you sense and approach pain.  Boost your body's ability to fight infections.  Help you connect better with other people.  Improve your sense of well-being. Follow these instructions at home:   Find a local in-person or online MBSR program.  Set aside some time regularly for mindfulness practice.  Find a mindfulness practice that works best for you. This may include one or more of the following: ? Meditation. Meditation involves focusing your mind on a certain thought or activity. ? Breathing awareness exercises. These help you to stay present by focusing on your breath. ? Body scan. For this practice, you lie down and pay attention to each part of your body from head to toe. You can identify tension and soreness and intentionally relax parts of your body. ? Yoga. Yoga involves stretching and breathing, and it can improve your ability to move and be flexible. It can also provide an experience of testing your body's limits, which can help you release stress. ? Mindful eating. This way of eating involves focusing on the taste, texture, color, and smell of each bite of food. Because this slows down eating and helps you feel full sooner, it can be an important part of a weight-loss plan.  Find a podcast or recording that provides guidance for breathing awareness, body scan, or meditation exercises. You can listen to these any time when you have a free moment to rest without distractions.  Follow your treatment plan as told by your health  care provider. This may include taking regular medicines and making changes to your diet or lifestyle as recommended. How to practice mindfulness To do a basic awareness exercise:  Find a  comfortable place to sit.  Pay attention to the present moment. Observe your thoughts, feelings, and surroundings just as they are.  Avoid placing judgment on yourself, your feelings, or your surroundings. Make note of any judgment that comes up, and let it go.  Your mind may wander, and that is okay. Make note of when your thoughts drift, and return your attention to the present moment. To do basic mindfulness meditation:  Find a comfortable place to sit. This may include a stable chair or a firm floor cushion. ? Sit upright with your back straight. Let your arms fall next to your side with your hands resting on your legs. ? If sitting in a chair, rest your feet flat on the floor. ? If sitting on a cushion, cross your legs in front of you.  Keep your head in a neutral position with your chin dropped slightly. Relax your jaw and rest the tip of your tongue on the roof of your mouth. Drop your gaze to the floor. You can close your eyes if you like.  Breathe normally and pay attention to your breath. Feel the air moving in and out of your nose. Feel your belly expanding and relaxing with each breath.  Your mind may wander, and that is okay. Make note of when your thoughts drift, and return your attention to your breath.  Avoid placing judgment on yourself, your feelings, or your surroundings. Make note of any judgment or feelings that come up, let them go, and bring your attention back to your breath.  When you are ready, lift your gaze or open your eyes. Pay attention to how your body feels after the meditation. Where to find more information You can find more information about MBSR from:  Your health care provider.  Community-based meditation centers or programs.  Programs offered near you. Summary  Mindfulness-based stress reduction (MBSR) is a program that teaches you how to intentionally pay attention to the present moment. It is used with other treatments to help you cope better  with daily stress, emotions, and pain.  MBSR focuses on developing self-awareness, which allows you to respond to life stress without judgment or negative emotions.  MBSR programs may involve learning different mindfulness practices, such as breathing exercises, meditation, yoga, body scan, or mindful eating. Find a mindfulness practice that works best for you, and set aside time for it on a regular basis. This information is not intended to replace advice given to you by your health care provider. Make sure you discuss any questions you have with your health care provider. Document Released: 03/22/2017 Document Revised: 10/26/2017 Document Reviewed: 03/22/2017 Elsevier Patient Education  2020 [employer].

## 2019-07-04 NOTE — Progress Notes (Signed)
PATIENT CARE CENTER INTERNAL MEDICINE AND SICKLE CELL CARE  SICKLE CELL ANEMIA FOLLOW UP VISIT PROVIDER: Lanae Boast, FNP    Subjective:   Jerry [name]  is a 33 y.o.  male who  has a past medical history of Sickle cell anemia ([place]). presents for a follow up for Sickle Cell Anemia. The patient has had 0 admissions in the past 6 months.  Pain regimen includes: Ibuprofen and oxycodone Hydrea Therapy: Yes Medication compliance: Yes  Pain today is 0/10 The patient reports adequate daily hydration.     Review of Systems  Constitutional: Negative.   HENT: Negative.   Eyes: Negative.   Respiratory: Negative.   Cardiovascular: Negative.   Gastrointestinal: Negative.   Genitourinary: Negative.   Musculoskeletal: Negative.   Skin: Negative.   Neurological: Negative.   Psychiatric/Behavioral: Negative.     Objective:   Objective  BP (!) 112/57 (BP Location: Left Arm, Patient Position: Sitting, Cuff Size: Large)    Pulse 89    Temp 98.3 F (36.8 C) (Oral)    Resp 14    Ht 5\' 11"  (1.803 m)    Wt 178 lb (80.7 kg)    SpO2 93%    BMI 24.83 kg/m   Wt Readings from Last 3 Encounters:  07/04/19 178 lb (80.7 kg)  05/09/19 175 lb (79.4 kg)  04/19/19 190 lb (86.2 kg)     Physical Exam Vitals signs and nursing note reviewed.  Constitutional:      General: He is not in acute distress.    Appearance: Normal appearance.  HENT:     Head: Normocephalic and atraumatic.  Eyes:     Extraocular Movements: Extraocular movements intact.     Conjunctiva/sclera: Conjunctivae normal.     Pupils: Pupils are equal, round, and reactive to light.  Cardiovascular:     Rate and Rhythm: Normal rate and regular rhythm.     Heart sounds: No murmur.  Pulmonary:     Effort: Pulmonary effort is normal.     Breath sounds: Normal breath sounds.  Musculoskeletal: Normal range of motion.  Skin:    General: Skin is warm and dry.  Neurological:     Mental Status: He is alert and oriented to  person, place, and time.  Psychiatric:        Mood and Affect: Mood normal.        Behavior: Behavior normal.        Thought Content: Thought content normal.        Judgment: Judgment normal.      Assessment/Plan:   Assessment   Encounter Diagnoses  Name Primary?   Hb-SS disease without crisis ([place]) Yes   Persistent proteinuria    Mood change      Plan   1. Hb-SS disease without crisis ([place]) - Urinalysis Dipstick - folic acid (FOLVITE) 1 MG tablet; Take 1 tablet (1 mg total) by mouth daily.  Dispense: 90 tablet; Refill: 3 - hydroxyurea (HYDREA) 500 MG capsule; Take 3 capsules (1,500 mg total) by mouth daily. May take with food to minimize GI side effects.  Dispense: 270 capsule; Refill: 3 - CBC with Differential - VITAMIN D 25 Hydroxy (Vit-D Deficiency, Fractures) - TSH - Comprehensive metabolic panel  2. Persistent proteinuria - lisinopril (ZESTRIL) 2.5 MG tablet; Take 1 tablet (2.5 mg total) by mouth daily.  Dispense: 90 tablet; Refill: 1  3. Fatigue - VITAMIN D 25 Hydroxy (Vit-D Deficiency, Fractures) - TSH   Return to care as scheduled and prn.  Patient verbalized understanding and agreed with plan of care.   1. Sickle cell disease - Continue Hydrea  We discussed the need for good hydration, monitoring of hydration status, avoidance of heat, cold, stress, and infection triggers. We discussed the risks and benefits of Hydrea, including bone marrow suppression, the possibility of GI upset, skin ulcers, hair thinning, and teratogenicity. The patient was reminded of the need to seek medical attention of any symptoms of bleeding, anemia, or infection. Continue folic acid 1 mg daily to prevent aplastic bone marrow crises.   2. Pulmonary evaluation - Patient denies severe recurrent wheezes, shortness of breath with exercise, or persistent cough. If these symptoms develop, pulmonary function tests with spirometry will be ordered, and if abnormal, plan on referral to  Pulmonology for further evaluation.  3. Cardiac - Routine screening for pulmonary hypertension is not recommended.  4. Eye - High risk of proliferative retinopathy. Annual eye exam with retinal exam recommended to patient.  5. Immunization status -  Yearly influenza vaccination is recommended, as well as being up to date with Meningococcal and Pneumococcal vaccines.   6. Acute and chronic painful episodes - We discussed that pt is to receive Schedule II prescriptions only from us. Pt is also aware that the prescription history is available to us online through the [facility] CSRS. Controlled substance agreement signed. We reminded [name] that all patients receiving Schedule II narcotics must be seen for follow within one month of prescription being requested. We reviewed the terms of our pain agreement, including the need to keep medicines in a safe locked location away from children or pets, and the need to report excess sedation or constipation, measures to avoid constipation, and policies related to early refills and stolen prescriptions. According to the [place] Chronic Pain Initiative program, we have reviewed details related to analgesia, adverse effects, aberrant behaviors.  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  Not applicable at this time.  If this occurs will use Exjade for management.   8. Vitamin D deficiency - Drisdol 50,000 units weekly. Patient encouraged to take as prescribed.   The above recommendations are taken from the NIH Evidence-Based Management of Sickle Cell Disease: Expert Panel Report, [id number].   Ms. [name], FNP-BC [facility] [address]  [place], [place] [id number] [phone]  This note has been created with Dragon speech recognition software and smart phrase technology. Any transcriptional errors are unintentional.

## 2019-07-05 LAB — CBC WITH DIFFERENTIAL/PLATELET
Basophils Absolute: 0.1 10*3/uL (ref 0.0–0.2)
Basos: 1 %
EOS (ABSOLUTE): 0.2 10*3/uL (ref 0.0–0.4)
Eos: 2 %
Hematocrit: 25.7 % — ABNORMAL LOW (ref 37.5–51.0)
Hemoglobin: 8.7 g/dL — ABNORMAL LOW (ref 13.0–17.7)
Immature Grans (Abs): 0.1 10*3/uL (ref 0.0–0.1)
Immature Granulocytes: 1 %
Lymphocytes Absolute: 2.6 10*3/uL (ref 0.7–3.1)
Lymphs: 25 %
MCH: 33 pg (ref 26.6–33.0)
MCHC: 33.9 g/dL (ref 31.5–35.7)
MCV: 97 fL (ref 79–97)
Monocytes Absolute: 1.2 10*3/uL — ABNORMAL HIGH (ref 0.1–0.9)
Monocytes: 12 %
NRBC: 3 % — ABNORMAL HIGH (ref 0–0)
Neutrophils Absolute: 6.3 10*3/uL (ref 1.4–7.0)
Neutrophils: 59 %
Platelets: 413 10*3/uL (ref 150–450)
RBC: 2.64 x10E6/uL — CL (ref 4.14–5.80)
RDW: 22.5 % — ABNORMAL HIGH (ref 11.6–15.4)
WBC: 10.5 10*3/uL (ref 3.4–10.8)

## 2019-07-05 LAB — COMPREHENSIVE METABOLIC PANEL
ALT: 13 IU/L (ref 0–44)
AST: 36 IU/L (ref 0–40)
Albumin/Globulin Ratio: 1.7 (ref 1.2–2.2)
Albumin: 4.5 g/dL (ref 4.1–5.2)
Alkaline Phosphatase: 98 IU/L (ref 39–117)
BUN/Creatinine Ratio: 13 (ref 9–20)
BUN: 11 mg/dL (ref 6–20)
Bilirubin Total: 2.5 mg/dL — ABNORMAL HIGH (ref 0.0–1.2)
CO2: 25 mmol/L (ref 20–29)
Calcium: 9.3 mg/dL (ref 8.7–10.2)
Chloride: 103 mmol/L (ref 96–106)
Creatinine, Ser: 0.84 mg/dL (ref 0.76–1.27)
GFR calc Af Amer: 136 mL/min/{1.73_m2} (ref >59)
GFR calc non Af Amer: 117 mL/min/{1.73_m2} (ref >59)
Globulin, Total: 2.7 g/dL (ref 1.5–4.5)
Glucose: 80 mg/dL (ref 65–99)
Potassium: 4.5 mmol/L (ref 3.5–5.2)
Sodium: 142 mmol/L (ref 134–144)
Total Protein: 7.2 g/dL (ref 6.0–8.5)

## 2019-07-05 LAB — VITAMIN D 25 HYDROXY (VIT D DEFICIENCY, FRACTURES): Vit D, 25-Hydroxy: 17.7 ng/mL — ABNORMAL LOW (ref 30.0–100.0)

## 2019-07-05 LAB — TSH: TSH: 0.809 u[IU]/mL (ref 0.450–4.500)

## 2019-07-07 ENCOUNTER — Telehealth: Payer: Self-pay

## 2019-07-07 MED ORDER — VITAMIN D (ERGOCALCIFEROL) 1.25 MG (50000 UNIT) PO CAPS: 50000.0000 [IU] | ORAL_CAPSULE | ORAL | 1 refills | Status: DC

## 2019-07-07 NOTE — Telephone Encounter (Signed)
Called, no answer and no voicemail. Will try later.  

## 2019-07-07 NOTE — Progress Notes (Signed)
Your vitamin D is low. I will send a prescription for weekly vitamin D to the pharmacy. We will continue to monitor your vitamin D levels.

## 2019-07-07 NOTE — Telephone Encounter (Signed)
-----   Message from Lanae Boast, [place] sent at 07/07/2019  9:07 AM EDT ----- Your vitamin D is low. I will send a prescription for weekly vitamin D to the pharmacy. We will continue to monitor your vitamin D levels.

## 2019-07-08 NOTE — Telephone Encounter (Signed)
Called, no answer. Left a message for patient to call back. Thanks!  

## 2019-07-09 NOTE — Telephone Encounter (Signed)
Called, no answer. Left a message. Thanks!  

## 2019-07-10 NOTE — Telephone Encounter (Signed)
I have not been able to get up with patient via phone. Will mail letter. Thanks!

## 2019-07-24 ENCOUNTER — Telehealth: Payer: Self-pay

## 2019-07-24 DIAGNOSIS — D571 Sickle-cell disease without crisis: Secondary | ICD-10-CM

## 2019-07-24 MED ORDER — OXYCODONE HCL 10 MG PO TABS: 10.0000 mg | ORAL_TABLET | Freq: Four times a day (QID) | ORAL | 0 refills | Status: DC | PRN

## 2019-07-24 MED ORDER — IBUPROFEN 800 MG PO TABS: ORAL_TABLET | ORAL | 0 refills | Status: DC

## 2019-07-24 MED FILL — FOLIC ACID 1 MG TABS: 1 | 30 days supply | Qty: 30 | Fill #0

## 2019-07-24 MED FILL — HYDROXYUREA 500 MG CAPSULE: 500 | 30 days supply | Qty: 90 | Fill #0

## 2019-07-24 MED FILL — LISINOPRIL 2.5 MG TABLET: 2.5 | 30 days supply | Qty: 30 | Fill #0

## 2019-07-24 NOTE — Telephone Encounter (Signed)
Reviewed [place] Substance Reporting system prior to prescribing opiate medications. No inconsistencies noted.   

## 2019-08-18 ENCOUNTER — Telehealth: Payer: Self-pay | Admitting: Family Medicine

## 2019-08-18 DIAGNOSIS — D571 Sickle-cell disease without crisis: Secondary | ICD-10-CM

## 2019-08-18 NOTE — Telephone Encounter (Signed)
Refill request for oxycodone. Please advise.  

## 2019-08-20 ENCOUNTER — Telehealth: Payer: Self-pay | Admitting: Family Medicine

## 2019-08-20 ENCOUNTER — Other Ambulatory Visit: Payer: Self-pay

## 2019-08-20 DIAGNOSIS — D571 Sickle-cell disease without crisis: Secondary | ICD-10-CM

## 2019-08-20 MED ORDER — IBUPROFEN 800 MG PO TABS: ORAL_TABLET | ORAL | 0 refills | Status: DC

## 2019-08-20 MED ORDER — FOLIC ACID 1 MG PO TABS: 1.0000 mg | ORAL_TABLET | Freq: Every day | ORAL | 3 refills | Status: DC

## 2019-08-20 MED ORDER — HYDROXYUREA 500 MG PO CAPS: 1500.0000 mg | ORAL_CAPSULE | Freq: Every day | ORAL | 3 refills | Status: DC

## 2019-08-20 MED FILL — IBUPROFEN 800 MG TABLET: 800 | 7 days supply | Qty: 21 | Fill #0

## 2019-08-20 MED FILL — HYDROXYUREA 500 MG CAPSULE: 500 | 30 days supply | Qty: 90 | Fill #0

## 2019-08-20 MED FILL — FOLIC ACID 1 MG TABS: 1 | 30 days supply | Qty: 30 | Fill #0

## 2019-08-20 NOTE — Telephone Encounter (Signed)
Refill request for oxycodone and hydromorphone. Please advise. Other refills have been sent in.

## 2019-08-21 ENCOUNTER — Other Ambulatory Visit: Payer: Self-pay | Admitting: Family Medicine

## 2019-08-21 DIAGNOSIS — D571 Sickle-cell disease without crisis: Secondary | ICD-10-CM

## 2019-08-21 MED ORDER — OXYCODONE HCL 10 MG PO TABS: 10.0000 mg | ORAL_TABLET | Freq: Four times a day (QID) | ORAL | 0 refills | Status: DC | PRN

## 2019-08-21 NOTE — Telephone Encounter (Signed)
Called, no answer. Left a message that all meds have been sent to the pharmacy today. Thanks!

## 2019-09-16 ENCOUNTER — Telehealth: Payer: Self-pay | Admitting: Internal Medicine

## 2019-09-17 ENCOUNTER — Other Ambulatory Visit: Payer: Self-pay | Admitting: Family Medicine

## 2019-09-17 DIAGNOSIS — D571 Sickle-cell disease without crisis: Secondary | ICD-10-CM

## 2019-09-17 MED ORDER — OXYCODONE HCL 10 MG PO TABS: 10.0000 mg | ORAL_TABLET | Freq: Four times a day (QID) | ORAL | 0 refills | Status: DC | PRN

## 2019-09-18 NOTE — Telephone Encounter (Signed)
Sent to [facility] 

## 2019-10-06 ENCOUNTER — Ambulatory Visit (INDEPENDENT_AMBULATORY_CARE_PROVIDER_SITE_OTHER): Payer: Self-pay | Admitting: Family Medicine

## 2019-10-06 ENCOUNTER — Encounter: Payer: Self-pay | Admitting: Family Medicine

## 2019-10-06 ENCOUNTER — Other Ambulatory Visit: Payer: Self-pay

## 2019-10-06 VITALS — BP 116/67 | HR 92 | Temp 98.6°F | Ht 71.0 in | Wt 172.2 lb

## 2019-10-06 DIAGNOSIS — R0789 Other chest pain: Secondary | ICD-10-CM

## 2019-10-06 DIAGNOSIS — D571 Sickle-cell disease without crisis: Secondary | ICD-10-CM

## 2019-10-06 DIAGNOSIS — Z09 Encounter for follow-up examination after completed treatment for conditions other than malignant neoplasm: Secondary | ICD-10-CM

## 2019-10-06 DIAGNOSIS — R801 Persistent proteinuria, unspecified: Secondary | ICD-10-CM

## 2019-10-06 DIAGNOSIS — Z79891 Long term (current) use of opiate analgesic: Secondary | ICD-10-CM

## 2019-10-06 LAB — POCT URINALYSIS DIPSTICK
Bilirubin, UA: NEGATIVE
Glucose, UA: NEGATIVE
Ketones, UA: NEGATIVE
Leukocytes, UA: NEGATIVE
Nitrite, UA: NEGATIVE
Protein, UA: POSITIVE — AB
Spec Grav, UA: 1.025 (ref 1.010–1.025)
Urobilinogen, UA: 2 E.U./dL — AB
pH, UA: 6 (ref 5.0–8.0)

## 2019-10-06 MED ORDER — LISINOPRIL 2.5 MG PO TABS: 2.5000 mg | ORAL_TABLET | Freq: Every day | ORAL | 6 refills | Status: DC

## 2019-10-06 MED ORDER — OXYCODONE HCL 10 MG PO TABS: 10.0000 mg | ORAL_TABLET | ORAL | 0 refills | Status: DC | PRN

## 2019-10-06 MED ORDER — FOLIC ACID 1 MG PO TABS: 1.0000 mg | ORAL_TABLET | Freq: Every day | ORAL | 6 refills | Status: DC

## 2019-10-06 MED ORDER — HYDROXYUREA 500 MG PO CAPS: 1500.0000 mg | ORAL_CAPSULE | Freq: Every day | ORAL | 6 refills | Status: DC

## 2019-10-06 NOTE — Progress Notes (Signed)
Patient Care Center Internal Medicine and Sickle Cell Care   Established Patient Office Visit  Subjective:  Patient ID: Jerry [name], male    DOB: 12/12/1985  Age: 33 y.o. MRN: [id number]  CC:  Chief Complaint  Patient presents with  . Sickle Cell Pain Crisis  . Establish Care    [facility]    HPI Jerry [name] is a 33 year old male who presents for Follow Up today.   Past Medical History:  Diagnosis Date  . Sickle cell anemia (HCC)    Current Status: This will be Mr. Jerry [name]'s initial office visit with me. He was previously seeing Jerry GipAndre Douglas, NP for his PCP needs. Since his last office visit, he is doing well with no complaints. He states that he has pain in his back, ribs, shoulders. He also has c/o mild right chest discomfort. He rates his pain today at 9/10. He states that his pain has been increasing lately and pain medications have not seemed to work. He has not had a hospital visit for Sickle Cell Crisis since 04/19/2019 where he was treated and discharged the same day. He is currently taking all medications as prescribed and staying well hydrated. He reports occasional nausea, constipation, dizziness and headaches. He denies fevers, chills, fatigue, recent infections, weight loss, and night sweats. He has not had any visual changes, and falls. No chest pain, heart palpitations, cough and shortness of breath reported. No reports of GI problems such as vomiting, diarrhea, and constipation. He has no reports of blood in stools, dysuria and hematuria. No depression or anxiety reported today.   History reviewed. No pertinent surgical history.  Family History  Problem Relation Age of Onset  . Sickle cell trait Mother     Social History   Socioeconomic History  . Marital status: Single    Spouse name: Not on file  . Number of children: Not on file  . Years of education: Not on file  . Highest education level: Not on file  Occupational History   . Not on file  Social Needs  . Financial resource strain: Not on file  . Food insecurity    Worry: Not on file    Inability: Not on file  . Transportation needs    Medical: Not on file    Non-medical: Not on file  Tobacco Use  . Smoking status: Never Smoker  . Smokeless tobacco: Never Used  Substance and Sexual Activity  . Alcohol use: No  . Drug use: Yes    Types: Marijuana    Comment: occasionally  . Sexual activity: Yes    Birth control/protection: Condom  Lifestyle  . Physical activity    Days per week: Not on file    Minutes per session: Not on file  . Stress: Not on file  Relationships  . Social Musicianconnections    Talks on phone: Not on file    Gets together: Not on file    Attends religious service: Not on file    Active member of club or organization: Not on file    Attends meetings of clubs or organizations: Not on file    Relationship status: Not on file  . Intimate partner violence    Fear of current or ex partner: Not on file    Emotionally abused: Not on file    Physically abused: Not on file    Forced sexual activity: Not on file  Other Topics Concern  . Not on file  Social History Narrative  . Not on file    Outpatient Medications Prior to Visit  Medication Sig Dispense Refill  . folic acid (FOLVITE) 1 MG tablet Take 1 tablet (1 mg total) by mouth daily. 90 tablet 3  . hydroxyurea (HYDREA) 500 MG capsule Take 3 capsules (1,500 mg total) by mouth daily. May take with food to minimize GI side effects. 270 capsule 3  . ibuprofen (ADVIL) 800 MG tablet TAKE 1 TABLET(800 MG) BY MOUTH THREE TIMES DAILY (Patient not taking: Reported on 10/06/2019) 21 tablet 0  . Vitamin D, Ergocalciferol, (DRISDOL) 1.25 MG (50000 UT) CAPS capsule Take 1 capsule (50,000 Units total) by mouth every 7 (seven) days. (Patient not taking: Reported on 10/06/2019) 30 capsule 1  . lisinopril (ZESTRIL) 2.5 MG tablet Take 1 tablet (2.5 mg total) by mouth daily. (Patient not taking: Reported on  10/06/2019) 90 tablet 1   No facility-administered medications prior to visit.     No Known Allergies  ROS Review of Systems  Constitutional: Negative.   HENT: Negative.   Eyes: Negative.   Respiratory: Positive for shortness of breath (occasional ).   Cardiovascular:       Chest discomfort  Gastrointestinal: Negative.   Endocrine: Negative.   Genitourinary: Negative.   Musculoskeletal: Negative.   Skin: Negative.   Allergic/Immunologic: Negative.   Neurological: Negative.   Hematological: Negative.   Psychiatric/Behavioral: Negative.    Objective:    Physical Exam  Constitutional: He is oriented to person, place, and time. He appears well-developed and well-nourished.  HENT:  Head: Normocephalic and atraumatic.  Eyes: Conjunctivae are normal.  Neck: Normal range of motion. Neck supple.  Cardiovascular: Normal rate, regular rhythm, normal heart sounds and intact distal pulses.  Pulmonary/Chest: Effort normal and breath sounds normal.  Abdominal: Soft. Bowel sounds are normal.  Musculoskeletal: Normal range of motion.  Neurological: He is alert and oriented to person, place, and time. He has normal reflexes.  Skin: Skin is warm and dry.  Psychiatric: He has a normal mood and affect. His behavior is normal. Judgment and thought content normal.  Nursing note and vitals reviewed.  BP 116/67   Pulse 92   Temp 98.6 F (37 C) (Oral)   Ht  (1.803 m)   Wt 172 lb 3.2 oz (78.1 kg)   SpO2 (!) 88%   BMI 24.02 kg/m  Wt Readings from Last 3 Encounters:  10/06/19 172 lb 3.2 oz (78.1 kg)  07/04/19 178 lb (80.7 kg)  05/09/19 175 lb (79.4 kg)   Health Maintenance Due  Topic Date Due  . INFLUENZA VACCINE  06/28/2019    There are no preventive care reminders to display for this patient.  Lab Results  Component Value Date   TSH 0.809 07/04/2019   Lab Results  Component Value Date   WBC 10.5 07/04/2019   HGB 8.7 (L) 07/04/2019   HCT 25.7 (L) 07/04/2019   MCV 97  07/04/2019   PLT 413 07/04/2019   Lab Results  Component Value Date   NA 142 07/04/2019   K 4.5 07/04/2019   CO2 25 07/04/2019   GLUCOSE 80 07/04/2019   BUN 11 07/04/2019   CREATININE 0.84 07/04/2019   BILITOT 2.5 (H) 07/04/2019   ALKPHOS 98 07/04/2019   AST 36 07/04/2019   ALT 13 07/04/2019   PROT 7.2 07/04/2019   ALBUMIN 4.5 07/04/2019   CALCIUM 9.3 07/04/2019   ANIONGAP 10 04/19/2019   Lab Results  Component Value Date   CHOL  130 01/09/2017   Lab Results  Component Value Date   HDL 46 01/09/2017   Lab Results  Component Value Date   LDLCALC 68 01/09/2017   Lab Results  Component Value Date   TRIG 81 01/09/2017   Lab Results  Component Value Date   CHOLHDL 2.8 01/09/2017   No results found for: HGBA1C    Assessment & Plan:    1. 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 He has mild chest discomfort, so we have referred him to ED today. He refuse to report to ED, and will contact our office for possible admittance to [facility] on tomorrow morning at 8:00 am. He will continue to take pain medications as prescribed; will continue to avoid extreme heat and cold; will continue to eat a healthy diet and drink at least 64 ounces of water daily; continue stool softener as needed; will avoid colds and flu; will continue to get plenty of sleep and rest; will continue to avoid high stressful situations and remain infection free; will continue Folic Acid 1 mg daily to avoid sickle cell crisis. Continue to follow up with Hematologist as needed. He will report to ED this evening if he experiences his pain remains unmanaged.  - Urinalysis Dipstick - Oxycodone HCl 10 MG TABS; Take 1 tablet (10 mg total) by mouth every 4 (four) hours as needed for up to 15 days.  Dispense: 90 tablet; Refill: 0 - hydroxyurea (HYDREA) 500 MG capsule; Take 3 capsules (1,500 mg total) by mouth daily. May take with food to minimize GI side effects.  Dispense:  90 capsule; Refill: 6 - folic acid (FOLVITE) 1 MG tablet; Take 1 tablet (1 mg total) by mouth daily.  Dispense: 30 tablet; Refill: 6 - lisinopril (ZESTRIL) 2.5 MG tablet; Take 1 tablet (2.5 mg total) by mouth daily.  Dispense: 30 tablet; Refill: 6  2. Chronic prescription opiate use Dose frequency increased today for better pain management.  - Oxycodone HCl 10 MG TABS; Take 1 tablet (10 mg total) by mouth every 4 (four) hours as needed for up to 15 days.  Dispense: 90 tablet; Refill: 0  3. Persistent proteinuria - lisinopril (ZESTRIL) 2.5 MG tablet; Take 1 tablet (2.5 mg total) by mouth daily.  Dispense: 30 tablet; Refill: 6  4. Chest discomfort Mild. Monitor.   5. Follow up He will follow up tomorrow for possible admittance to [facility] for IVFs. He will follow up in 2 months for office visit.  Meds ordered this encounter  Medications  . Oxycodone HCl 10 MG TABS    Sig: Take 1 tablet (10 mg total) by mouth every 4 (four) hours as needed for up to 15 days.    Dispense:  90 tablet    Refill:  0    *increase in frequency*    Order Specific Question:   Supervising Provider    Answer:   Tresa Garter [id number]  . hydroxyurea (HYDREA) 500 MG capsule    Sig: Take 3 capsules (1,500 mg total) by mouth daily. May take with food to minimize GI side effects.    Dispense:  90 capsule    Refill:  6  . folic acid (FOLVITE) 1 MG tablet    Sig: Take 1 tablet (1 mg total) by mouth daily.    Dispense:  30 tablet    Refill:  6  . lisinopril (ZESTRIL) 2.5 MG tablet    Sig: Take 1 tablet (2.5  mg total) by mouth daily.    Dispense:  30 tablet    Refill:  6    Orders Placed This Encounter  Procedures  . 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 Oxycodone HCl 10 MG TABS    Sig: Take 1 tablet (10 mg total) by mouth every 4 (four) hours as needed for up to 15 days.    Dispense:  90 tablet    Refill:  0    *increase in frequency*    Order Specific Question:   Supervising Provider    Answer:   Quentin Angst [id number]  . hydroxyurea (HYDREA) 500 MG capsule    Sig: Take 3 capsules (1,500 mg total) by mouth daily. May take with food to minimize GI side effects.    Dispense:  90 capsule    Refill:  6  . folic acid (FOLVITE) 1 MG tablet    Sig: Take 1 tablet (1 mg total) by mouth daily.    Dispense:  30 tablet    Refill:  6  . lisinopril (ZESTRIL) 2.5 MG tablet    Sig: Take 1 tablet (2.5 mg total) by mouth daily.    Dispense:  30 tablet    Refill:  6    Follow-up: Return in about 2 months (around 12/06/2019).    Kallie Locks, FNP

## 2019-10-07 ENCOUNTER — Telehealth (HOSPITAL_COMMUNITY): Payer: Self-pay | Admitting: General Practice

## 2019-10-07 DIAGNOSIS — R0789 Other chest pain: Secondary | ICD-10-CM | POA: Insufficient documentation

## 2019-10-07 DIAGNOSIS — Z79891 Long term (current) use of opiate analgesic: Secondary | ICD-10-CM | POA: Insufficient documentation

## 2019-10-07 DIAGNOSIS — R801 Persistent proteinuria, unspecified: Secondary | ICD-10-CM

## 2019-10-07 HISTORY — DX: Long term (current) use of opiate analgesic: Z79.891

## 2019-10-07 HISTORY — DX: Persistent proteinuria, unspecified: R80.1

## 2019-10-07 NOTE — Telephone Encounter (Signed)
Patient called stating he woke up late today, even though he was told to call the day hospital today. According to the patient, he has home medications for pain control today. So he will prefer to call back first thing in the morning tomorrow if he doesn't feel better today. Provider [place] notified.

## 2019-10-08 ENCOUNTER — Non-Acute Institutional Stay (HOSPITAL_COMMUNITY)
Admission: AD | Admit: 2019-10-08 | Discharge: 2019-10-08 | Disposition: A | Discharge status: Home / Self Care | Payer: Self-pay | Source: Ambulatory Visit | Attending: Internal Medicine | Admitting: Internal Medicine

## 2019-10-08 ENCOUNTER — Encounter: Payer: Self-pay | Admitting: Family Medicine

## 2019-10-08 ENCOUNTER — Non-Acute Institutional Stay (HOSPITAL_COMMUNITY): Admission: AD | Admit: 2019-10-08 | Payer: Self-pay | Source: Ambulatory Visit | Admitting: Internal Medicine

## 2019-10-08 DIAGNOSIS — G894 Chronic pain syndrome: Secondary | ICD-10-CM | POA: Insufficient documentation

## 2019-10-08 DIAGNOSIS — Z791 Long term (current) use of non-steroidal anti-inflammatories (NSAID): Secondary | ICD-10-CM | POA: Insufficient documentation

## 2019-10-08 DIAGNOSIS — Z832 Family history of diseases of the blood and blood-forming organs and certain disorders involving the immune mechanism: Secondary | ICD-10-CM | POA: Insufficient documentation

## 2019-10-08 DIAGNOSIS — D5700 Hb-SS disease with crisis, unspecified: Secondary | ICD-10-CM | POA: Insufficient documentation

## 2019-10-08 DIAGNOSIS — Z79899 Other long term (current) drug therapy: Secondary | ICD-10-CM | POA: Insufficient documentation

## 2019-10-08 DIAGNOSIS — D638 Anemia in other chronic diseases classified elsewhere: Secondary | ICD-10-CM | POA: Insufficient documentation

## 2019-10-08 LAB — CBC WITH DIFFERENTIAL/PLATELET
Abs Immature Granulocytes: 0.12 10*3/uL — ABNORMAL HIGH (ref 0.00–0.07)
Basophils Absolute: 0 10*3/uL (ref 0.0–0.1)
Basophils Relative: 0 %
Eosinophils Absolute: 0.1 10*3/uL (ref 0.0–0.5)
Eosinophils Relative: 1 %
HCT: 23.3 % — ABNORMAL LOW (ref 39.0–52.0)
Hemoglobin: 8.1 g/dL — ABNORMAL LOW (ref 13.0–17.0)
Immature Granulocytes: 1 %
Lymphocytes Relative: 14 %
Lymphs Abs: 2.3 10*3/uL (ref 0.7–4.0)
MCH: 33.9 pg (ref 26.0–34.0)
MCHC: 34.8 g/dL (ref 30.0–36.0)
MCV: 97.5 fL (ref 80.0–100.0)
Monocytes Absolute: 2.7 10*3/uL — ABNORMAL HIGH (ref 0.1–1.0)
Monocytes Relative: 16 %
Neutro Abs: 11.8 10*3/uL — ABNORMAL HIGH (ref 1.7–7.7)
Neutrophils Relative %: 68 %
Platelets: 340 10*3/uL (ref 150–400)
RBC: 2.39 MIL/uL — ABNORMAL LOW (ref 4.22–5.81)
RDW: 24.6 % — ABNORMAL HIGH (ref 11.5–15.5)
WBC: 17.1 10*3/uL — ABNORMAL HIGH (ref 4.0–10.5)
nRBC: 2.8 % — ABNORMAL HIGH (ref 0.0–0.2)

## 2019-10-08 LAB — RAPID URINE DRUG SCREEN, HOSP PERFORMED
Amphetamines: NOT DETECTED
Barbiturates: NOT DETECTED
Benzodiazepines: NOT DETECTED
Cocaine: NOT DETECTED
Opiates: POSITIVE — AB
Tetrahydrocannabinol: POSITIVE — AB

## 2019-10-08 LAB — COMPREHENSIVE METABOLIC PANEL
ALT: 24 U/L (ref 0–44)
AST: 61 U/L — ABNORMAL HIGH (ref 15–41)
Albumin: 4.7 g/dL (ref 3.5–5.0)
Alkaline Phosphatase: 64 U/L (ref 38–126)
Anion gap: 11 (ref 5–15)
BUN: 13 mg/dL (ref 6–20)
CO2: 23 mmol/L (ref 22–32)
Calcium: 9.1 mg/dL (ref 8.9–10.3)
Chloride: 103 mmol/L (ref 98–111)
Creatinine, Ser: 0.69 mg/dL (ref 0.61–1.24)
GFR calc Af Amer: >60 mL/min (ref >60)
GFR calc non Af Amer: >60 mL/min (ref >60)
Glucose, Bld: 89 mg/dL (ref 70–99)
Potassium: 4 mmol/L (ref 3.5–5.1)
Sodium: 137 mmol/L (ref 135–145)
Total Bilirubin: 4.4 mg/dL — ABNORMAL HIGH (ref 0.3–1.2)
Total Protein: 7.9 g/dL (ref 6.5–8.1)

## 2019-10-08 LAB — URINALYSIS, ROUTINE W REFLEX MICROSCOPIC
Bilirubin Urine: NEGATIVE
Glucose, UA: NEGATIVE mg/dL
Ketones, ur: NEGATIVE mg/dL
Leukocytes,Ua: NEGATIVE
Nitrite: NEGATIVE
Protein, ur: 30 mg/dL — AB
Specific Gravity, Urine: 1.011 (ref 1.005–1.030)
pH: 5 (ref 5.0–8.0)

## 2019-10-08 LAB — RETICULOCYTES
Immature Retic Fract: 39.3 % — ABNORMAL HIGH (ref 2.3–15.9)
RBC.: 2.39 MIL/uL — ABNORMAL LOW (ref 4.22–5.81)
Retic Count, Absolute: 564.9 10*3/uL — ABNORMAL HIGH (ref 19.0–186.0)
Retic Ct Pct: 22.9 % — ABNORMAL HIGH (ref 0.4–3.1)

## 2019-10-08 MED ORDER — ACETAMINOPHEN 500 MG PO TABS
1000.0000 mg | ORAL_TABLET | Freq: Once | ORAL | Status: AC
  Administered 2019-10-08: 1000 mg via ORAL
  Filled 2019-10-08: qty 2

## 2019-10-08 MED ORDER — NALOXONE HCL 0.4 MG/ML IJ SOLN: 0.4000 mg | INTRAMUSCULAR | Status: DC | PRN

## 2019-10-08 MED ORDER — KETOROLAC TROMETHAMINE 15 MG/ML IJ SOLN
15.0000 mg | Freq: Once | INTRAMUSCULAR | Status: AC
  Administered 2019-10-08: 15 mg via INTRAVENOUS
  Filled 2019-10-08: qty 1

## 2019-10-08 MED ORDER — SODIUM CHLORIDE 0.45 % IV SOLN
INTRAVENOUS | Status: DC
  Administered 2019-10-08: 10:00:00 via INTRAVENOUS

## 2019-10-08 MED ORDER — ONDANSETRON HCL 4 MG/2ML IJ SOLN: 4.0000 mg | Freq: Four times a day (QID) | INTRAMUSCULAR | Status: DC | PRN

## 2019-10-08 MED ORDER — HYDROMORPHONE HCL 1 MG/ML IJ SOLN
1.0000 mg | Freq: Once | INTRAMUSCULAR | Status: AC
  Administered 2019-10-08: 1 mg via INTRAVENOUS
  Filled 2019-10-08: qty 1

## 2019-10-08 MED ORDER — OXYCODONE HCL 5 MG PO TABS
10.0000 mg | ORAL_TABLET | ORAL | Status: DC | PRN
  Administered 2019-10-08: 10 mg via ORAL
  Filled 2019-10-08: qty 2

## 2019-10-08 MED ORDER — DIPHENHYDRAMINE HCL 12.5 MG/5ML PO ELIX: 12.5000 mg | ORAL_SOLUTION | Freq: Four times a day (QID) | ORAL | Status: DC | PRN

## 2019-10-08 MED ORDER — DIPHENHYDRAMINE HCL 25 MG PO CAPS
25.0000 mg | ORAL_CAPSULE | ORAL | Status: DC | PRN
  Administered 2019-10-08: 25 mg via ORAL
  Filled 2019-10-08: qty 1

## 2019-10-08 MED ORDER — SODIUM CHLORIDE 0.9% FLUSH: 9.0000 mL | INTRAVENOUS | Status: DC | PRN

## 2019-10-08 MED ORDER — HYDROMORPHONE 1 MG/ML IV SOLN: INTRAVENOUS | Status: DC

## 2019-10-08 MED ORDER — DIPHENHYDRAMINE HCL 50 MG/ML IJ SOLN: 12.5000 mg | Freq: Four times a day (QID) | INTRAMUSCULAR | Status: DC | PRN

## 2019-10-08 NOTE — BH Specialist Note (Signed)
Integrated Behavioral Health Referral Note  Reason for Referral: [name] is a 33 y.o. male  Pt was referred by clinic for: insurance needs   Pt reports the following concerns: does not have insurance coverage  Assessment: Patient reports not having insurance coverage. He works full time and therefore may or may not be eligible for Medicaid, based on income, despite sickle cell diagnosis.  Reports having had temporary coverage in the past that caseworker at [facility] assisted with.   Plan: 1. Addressed today: Patient consented for CSW to communicate with [facility]. Coordinated with [facility] social worker for referral back to patient's caseworker for assistance with insurance. Advised that patient may also want to look into private coverage through the [place].   Patient also had questions about SNAP benefits. Advised patient of how to apply online and provided CSW contact information for additional assistance as needed.  2. Referral: [facility] and [employer]HS  3. Follow up: will support in coordination with [facility] case worker  Estanislado Emms, [facility] [phone]

## 2019-10-08 NOTE — Progress Notes (Signed)
Pt admitted to day hospital for treatment of sickle cell crisis, c/o pain 9/10 to ribs, back and shoulders. Pt received IV fluids, Dilaudid IV push and other medications (see MAR). At discharge, pt stated his pain level was 4/10. Note provided to patient for work (he is to remain out of work through Friday per provider).  Pt alert and ambulatory at discharge, vital signs stable.  Coolidge Breeze, RN 10/08/2019

## 2019-10-08 NOTE — Discharge Summary (Signed)
[facility] Discharge Summary   Patient ID: Jerry [name] MRN: [id number] DOB/AGE: 1986-08-22 33 y.o.  Admit date: 10/08/2019 Discharge date: 10/08/2019  Primary Care Physician:  Quentin Angst, MD  Admission Diagnoses:  Active Problems:   Sickle cell crisis [facility])  Discharge Medications:  Allergies as of 10/08/2019   No Known Allergies     Medication List    TAKE these medications   folic acid 1 MG tablet Commonly known as: FOLVITE Take 1 tablet (1 mg total) by mouth daily.   hydroxyurea 500 MG capsule Commonly known as: HYDREA Take 3 capsules (1,500 mg total) by mouth daily. May take with food to minimize GI side effects.   ibuprofen 800 MG tablet Commonly known as: ADVIL TAKE 1 TABLET(800 MG) BY MOUTH THREE TIMES DAILY   lisinopril 2.5 MG tablet Commonly known as: ZESTRIL Take 1 tablet (2.5 mg total) by mouth daily.   Oxycodone HCl 10 MG Tabs Take 1 tablet (10 mg total) by mouth every 4 (four) hours as needed for up to 15 days.   Vitamin D (Ergocalciferol) 1.25 MG (50000 UT) Caps capsule Commonly known as: DRISDOL Take 1 capsule (50,000 Units total) by mouth every 7 (seven) days.        Consults:  None  Significant Diagnostic Studies:  No results found.  History of present illness: Jerry [name], a 33 year old male with a medical history significant for sickle cell disease, chronic pain syndrome, and history of anemia of chronic disease presents complaining of generalized pain that is consistent with his typical crisis.  Patient states that pain is primarily to left chest/ribs, left lower back, and lower extremities.  Patient attributes current pain crisis to increased work hours over the past several weeks.  He has been working 12-hour shifts 7 days/week for the past month.  Patient states that pain intensity was increased 2 days ago and he attempted to work through the pain, however pain worsened.  Pain intensity 10/10  characterized as constant and throbbing.  Patient states that pain intensity increases with prolonged standing.  He currently denies headache, fever, chills, shortness of breath, urinary symptoms, nausea, vomiting, or diarrhea.  No sick contacts, recent travel, or exposure to COVID-19  [facility] Course: Patient admitted to sickle cell day infusion center for management of pain crisis. All laboratory values reviewed, WBC is elevated at 17.1.  Patient is afebrile, no signs of infection or inflammation.  Suspected to be reactive.  No antibiotics warranted at this time. Hemoglobin 8.1, consistent with patient's baseline.  No clinical indication for blood transfusion at this time.  All other labs reassuring. Pain managed with Dilaudid 1 mg IV x2 doses Oxycodone 10 mg by mouth x1 dose IV Toradol 15 mg x 1 Tylenol 1000 mg by mouth x1 IV fluids, 0.45% saline at 75 mL/h Pain intensity decreased to 6/10.  Patient is requesting discharge home.  He does not want a hospital admission at this time.  He says that he will attempt to manage at home with prescribed medications. Patient alert, oriented, and ambulating without assistance.  He will discharge home in a hemodynamically stable condition.  Discharge instructions: Resume all home medications.  Follow up with PCP as previously  scheduled.   Discussed the importance of drinking 64 ounces of water daily to  help prevent pain crises, it is important to drink plenty of water throughout the day. This is because dehydration of red blood cells may lead further sickling.  Avoid all stressors that precipitate sickle cell pain crisis.     The patient was given clear instructions to go to ER or return to medical center if symptoms do not improve, worsen or new problems develop.    Physical Exam at Discharge:  There were no vitals taken for this visit.  Physical Exam Constitutional:      General: He is not in acute distress. HENT:      Mouth/Throat:     Mouth: Mucous membranes are moist.     Pharynx: Oropharynx is clear.  Eyes:     Pupils: Pupils are equal, round, and reactive to light.  Cardiovascular:     Rate and Rhythm: Normal rate.     Pulses: Normal pulses.     Heart sounds: Normal heart sounds.  Pulmonary:     Effort: Pulmonary effort is normal.  Abdominal:     General: Abdomen is flat. Bowel sounds are normal.     Palpations: Abdomen is soft.  Musculoskeletal: Normal range of motion.  Skin:    General: Skin is warm.  Neurological:     General: No focal deficit present.     Disposition at Discharge: Discharge disposition: 01-Home or Self Care       Discharge Orders: Discharge Instructions    Discharge patient   Complete by: As directed    Discharge disposition: 01-Home or Self Care   Discharge patient date: 10/08/2019      Condition at Discharge:   Stable  Time spent on Discharge:  Greater than 30 minutes.  Signed:  Donia Pounds  APRN, MSN, FNP-C Patient [facility] [address] [place], [place] [address] [phone]  10/08/2019, 1:15 PM

## 2019-10-08 NOTE — Discharge Instructions (Signed)
Sickle Cell Anemia, Adult ° °Sickle cell anemia is a condition in which red blood cells have an abnormal “sickle” shape. Red blood cells carry oxygen through the body. Sickle-shaped red blood cells do not live as long as normal red blood cells. They also clump together and block blood from flowing through the blood vessels. This condition prevents the body from getting enough oxygen. Sickle cell anemia causes organ damage and pain. It also increases the risk of infection. °What are the causes? °This condition is caused by a gene that is passed from parent to child (inherited). Receiving two copies of the gene causes the disease. Receiving one copy causes the "trait," which means that symptoms are milder or not present. °What increases the risk? °This condition is more likely to develop if your ancestors were from [place], the Mediterranean, South or [place], the [place], [place], or the Middle [place]. °What are the signs or symptoms? °Symptoms of this condition include: °· Episodes of pain (crises), especially in the hands and feet, joints, back, chest, or abdomen. The pain can be triggered by: °? An illness, especially if there is dehydration. °? Doing an activity with great effort (overexertion). °? Exposure to extreme temperature changes. °? High altitude. °· Fatigue. °· Shortness of breath or difficulty breathing. °· Dizziness. °· Pale skin or yellowed skin (jaundice). °· Frequent bacterial infections. °· Pain and swelling in the hands and feet (hand-food syndrome). °· Prolonged, painful erection of the penis (priapism). °· Acute chest syndrome. Symptoms of this include: °? Chest pain. °? Fever. °? Cough. °? Fast breathing. °· Stroke. °· Decreased activity. °· Loss of appetite. °· Change in behavior. °· Headaches. °· Seizures. °· Vision changes. °· Skin ulcers. °· Heart disease. °· High blood pressure. °· Gallstones. °· Liver and kidney problems. °How is this diagnosed? °This condition is diagnosed with  blood tests that check for the gene that causes this condition. °How is this treated? °There is no cure for most cases of this condition. Treatment focuses on managing your symptoms and preventing complications of the disease. Your health care provider will work with you to identify the best treatment options for you based on an assessment of your condition. Treatment may include: °· Medicines, including: °? Pain medicines. °? Antibiotic medicines for infection. °? Medicines to increase the production of a protein in red blood cells that helps carry oxygen in the body (hemoglobin). °· Fluids to treat pain and swelling. °· Oxygen to treat acute chest syndrome. °· Blood transfusions to treat symptoms such as fatigue, stroke, and acute chest syndrome. °· Massage and physical therapy for pain. °· Regular tests to monitor your condition, such as blood tests, X-rays, CT scans, MRI scans, ultrasounds, and lung function tests. These should be done every 3-12 months, depending on your age. °· Hematopoietic stem cell transplant. This is a procedure to replace abnormal stem cells with healthy stem cells from a donor's bone marrow. Stem cells are cells that can develop into blood cells, and bone marrow is the spongy tissue inside the bones. °Follow these instructions at home: °Medicines °· Take over-the-counter and prescription medicines only as told by your health care provider. °· If you were prescribed an antibiotic medicine, take it as told by your health care provider. Do not stop taking the antibiotic even if you start to feel better. °· If you develop a fever, do not take medicines to reduce the fever right away. This could cover up another problem. Notify your health care provider. °Managing   pain, stiffness, and swelling °· Try these methods to help ease your pain: °? Using a heating pad. °? Taking a warm bath. °? Distracting yourself, such as by watching TV. °Eating and drinking °· Drink enough fluid to keep your urine  clear or pale yellow. Drink more in hot weather and during exercise. °· Limit or avoid drinking alcohol. °· Eat a balanced and nutritious diet. Eat plenty of fruits, vegetables, whole grains, and lean protein. °· Take vitamins and supplements as directed by your health care provider. °Traveling °· When traveling, keep these with you: °? Your medical information. °? The names of your health care providers. °? Your medicines. °· If you have to travel by air, ask about precautions you should take. °Activity °· Get plenty of rest. °· Avoid activities that will lower your oxygen levels, such as exercising vigorously. °General instructions °· Do not use any products that contain nicotine or tobacco, such as cigarettes and e-cigarettes. They lower blood oxygen levels. If you need help quitting, ask your health care provider. °· Consider wearing a medical alert bracelet. °· Avoid high altitudes. °· Avoid extreme temperatures and extreme temperature changes. °· Keep all follow-up visits as told by your health care provider. This is important. °Contact a health care provider if: °· You develop joint pain. °· Your feet or hands swell or have pain. °· You have fatigue. °Get help right away if: °· You have symptoms of infection. These include: °? Fever. °? Chills. °? Extreme tiredness. °? Irritability. °? Poor eating. °? Vomiting. °· You feel dizzy or faint. °· You have new abdominal pain, especially on the left side near the stomach area. °· You develop priapism. °· You have numbness in your arms or legs or have trouble moving them. °· You have trouble talking. °· You develop pain that cannot be controlled with medicine. °· You become short of breath. °· You have rapid breathing. °· You have a persistent cough. °· You have pain in your chest. °· You develop a severe headache or stiff neck. °· You feel bloated without eating or after eating a small amount of food. °· Your skin is pale. °· You suddenly lose  vision. °Summary °· Sickle cell anemia is a condition in which red blood cells have an abnormal “sickle” shape. This disease can cause organ damage and chronic pain, and it can raise your risk of infection. °· Sickle cell anemia is a genetic disorder. °· Treatment focuses on managing your symptoms and preventing complications of the disease. °· Get medical help right away if you have any signs of infection, such as a fever. °This information is not intended to replace advice given to you by your health care provider. Make sure you discuss any questions you have with your health care provider. °Document Released: 02/21/2006 Document Revised: 03/07/2019 Document Reviewed: 12/19/2016 °Elsevier Patient Education © 2020 [employer] ° °

## 2019-10-08 NOTE — H&P (Signed)
[facility] History and Physical   Date: 10/08/2019  Patient name: Jerry [name] K Haire Medical record number: [id number] Date of birth: 12/02/1985 Age: 33 y.o. Gender: male PCP: Quentin AngstJegede, Olugbemiga E, MD  Attending physician: Quentin AngstJegede, Olugbemiga E, MD  Chief Complaint: Sickle cell pain  History of Present Illness: Jerry [name], a 33 year old male with a medical history significant for sickle cell disease, chronic pain syndrome, and history of anemia of chronic disease presents complaining of generalized pain that is consistent with his typical crisis.  Patient states that pain is primarily to left chest/ribs, left lower back, and lower extremities.  Patient attributes current pain crisis to increased work hours over the past several weeks.  He has been working 12-hour shifts 7 days/week for the past month.  Patient states that pain intensity was increased 2 days ago and he attempted to work through the pain, however pain worsened.  Pain intensity 10/10 characterized as constant and throbbing.  Patient states that pain intensity increases with prolonged standing.  He currently denies headache, fever, chills, shortness of breath, urinary symptoms, nausea, vomiting, or diarrhea.  No sick contacts, recent travel, or exposure to COVID-19   Meds: Medications Prior to Admission  Medication Sig Dispense Refill Last Dose  . folic acid (FOLVITE) 1 MG tablet Take 1 tablet (1 mg total) by mouth daily. 30 tablet 6   . hydroxyurea (HYDREA) 500 MG capsule Take 3 capsules (1,500 mg total) by mouth daily. May take with food to minimize GI side effects. 90 capsule 6   . ibuprofen (ADVIL) 800 MG tablet TAKE 1 TABLET(800 MG) BY MOUTH THREE TIMES DAILY (Patient not taking: Reported on 10/06/2019) 21 tablet 0   . lisinopril (ZESTRIL) 2.5 MG tablet Take 1 tablet (2.5 mg total) by mouth daily. 30 tablet 6   . Oxycodone HCl 10 MG TABS Take 1 tablet (10 mg total) by mouth every 4 (four) hours as  needed for up to 15 days. 90 tablet 0   . Vitamin D, Ergocalciferol, (DRISDOL) 1.25 MG (50000 UT) CAPS capsule Take 1 capsule (50,000 Units total) by mouth every 7 (seven) days. (Patient not taking: Reported on 10/06/2019) 30 capsule 1     Allergies: Patient has no known allergies. Past Medical History:  Diagnosis Date  . Sickle cell anemia (HCC)    No past surgical history on file. Family History  Problem Relation Age of Onset  . Sickle cell trait Mother    Social History   Socioeconomic History  . Marital status: Single    Spouse name: Not on file  . Number of children: Not on file  . Years of education: Not on file  . Highest education level: Not on file  Occupational History  . Not on file  Social Needs  . Financial resource strain: Not on file  . Food insecurity    Worry: Not on file    Inability: Not on file  . Transportation needs    Medical: Not on file    Non-medical: Not on file  Tobacco Use  . Smoking status: Never Smoker  . Smokeless tobacco: Never Used  Substance and Sexual Activity  . Alcohol use: No  . Drug use: Yes    Types: Marijuana    Comment: occasionally  . Sexual activity: Yes    Birth control/protection: Condom  Lifestyle  . Physical activity    Days per week: Not on file    Minutes per session: Not on file  . Stress: Not  on file  Relationships  . Social Herbalist on phone: Not on file    Gets together: Not on file    Attends religious service: Not on file    Active member of club or organization: Not on file    Attends meetings of clubs or organizations: Not on file    Relationship status: Not on file  . Intimate partner violence    Fear of current or ex partner: Not on file    Emotionally abused: Not on file    Physically abused: Not on file    Forced sexual activity: Not on file  Other Topics Concern  . Not on file  Social History Narrative  . Not on file   Review of Systems  Constitutional: Negative for chills and  fever.  HENT: Negative.   Eyes: Negative.   Respiratory: Negative.   Cardiovascular: Positive for chest pain (Left rib pain). Negative for palpitations.  Gastrointestinal: Negative.   Genitourinary: Negative.   Musculoskeletal: Positive for back pain and joint pain.  Skin: Negative.   Neurological: Negative.   Endo/Heme/Allergies: Negative.     Physical Exam: There were no vitals taken for this visit. Physical Exam Constitutional:      General: He is not in acute distress.    Appearance: Normal appearance.  HENT:     Head: Normocephalic.     Nose: Nose normal.     Mouth/Throat:     Mouth: Mucous membranes are moist.  Eyes:     Pupils: Pupils are equal, round, and reactive to light.  Cardiovascular:     Rate and Rhythm: Normal rate and regular rhythm.     Pulses: Normal pulses.     Heart sounds: Normal heart sounds.  Pulmonary:     Effort: Pulmonary effort is normal.     Breath sounds: Normal breath sounds.  Abdominal:     General: Bowel sounds are normal.  Musculoskeletal: Normal range of motion.  Skin:    General: Skin is warm.  Neurological:     General: No focal deficit present.     Mental Status: He is alert. Mental status is at baseline.  Psychiatric:        Mood and Affect: Mood normal.        Behavior: Behavior normal.        Thought Content: Thought content normal.        Judgment: Judgment normal.      Lab results: Results for orders placed or performed during the hospital encounter of 10/08/19 (from the past 24 hour(s))  Urinalysis, Routine w reflex microscopic     Status: Abnormal   Collection Time: 10/08/19  9:38 AM  Result Value Ref Range   Color, Urine YELLOW YELLOW   APPearance CLEAR CLEAR   Specific Gravity, Urine 1.011 1.005 - 1.030   pH 5.0 5.0 - 8.0   Glucose, UA NEGATIVE NEGATIVE mg/dL   Hgb urine dipstick SMALL (A) NEGATIVE   Bilirubin Urine NEGATIVE NEGATIVE   Ketones, ur NEGATIVE NEGATIVE mg/dL   Protein, ur 30 (A) NEGATIVE mg/dL    Nitrite NEGATIVE NEGATIVE   Leukocytes,Ua NEGATIVE NEGATIVE   RBC / HPF 0-5 0 - 5 RBC/hpf   WBC, UA 0-5 0 - 5 WBC/hpf   Bacteria, UA RARE (A) NONE SEEN   Squamous Epithelial / LPF 0-5 0 - 5   Mucus PRESENT   Rapid urine drug screen (hospital performed)     Status: Abnormal   Collection Time: 10/08/19  9:38 AM  Result Value Ref Range   Opiates POSITIVE (A) NONE DETECTED   Cocaine NONE DETECTED NONE DETECTED   Benzodiazepines NONE DETECTED NONE DETECTED   Amphetamines NONE DETECTED NONE DETECTED   Tetrahydrocannabinol POSITIVE (A) NONE DETECTED   Barbiturates NONE DETECTED NONE DETECTED  Comprehensive metabolic panel     Status: Abnormal   Collection Time: 10/08/19 10:21 AM  Result Value Ref Range   Sodium 137 135 - 145 mmol/L   Potassium 4.0 3.5 - 5.1 mmol/L   Chloride 103 98 - 111 mmol/L   CO2 23 22 - 32 mmol/L   Glucose, Bld 89 70 - 99 mg/dL   BUN 13 6 - 20 mg/dL   Creatinine, Ser 9.60 0.61 - 1.24 mg/dL   Calcium 9.1 8.9 - 45.4 mg/dL   Total Protein 7.9 6.5 - 8.1 g/dL   Albumin 4.7 3.5 - 5.0 g/dL   AST 61 (H) 15 - 41 U/L   ALT 24 0 - 44 U/L   Alkaline Phosphatase 64 38 - 126 U/L   Total Bilirubin 4.4 (H) 0.3 - 1.2 mg/dL   GFR calc non Af Amer >60 >60 mL/min   GFR calc Af Amer >60 >60 mL/min   Anion gap 11 5 - 15  CBC WITH DIFFERENTIAL     Status: Abnormal   Collection Time: 10/08/19 10:21 AM  Result Value Ref Range   WBC 17.1 (H) 4.0 - 10.5 K/uL   RBC 2.39 (L) 4.22 - 5.81 MIL/uL   Hemoglobin 8.1 (L) 13.0 - 17.0 g/dL   HCT 09.8 (L) 11.9 - 14.7 %   MCV 97.5 80.0 - 100.0 fL   MCH 33.9 26.0 - 34.0 pg   MCHC 34.8 30.0 - 36.0 g/dL   RDW 82.9 (H) 56.2 - 13.0 %   Platelets 340 150 - 400 K/uL   nRBC 2.8 (H) 0.0 - 0.2 %   Neutrophils Relative % 68 %   Neutro Abs 11.8 (H) 1.7 - 7.7 K/uL   Lymphocytes Relative 14 %   Lymphs Abs 2.3 0.7 - 4.0 K/uL   Monocytes Relative 16 %   Monocytes Absolute 2.7 (H) 0.1 - 1.0 K/uL   Eosinophils Relative 1 %   Eosinophils Absolute 0.1  0.0 - 0.5 K/uL   Basophils Relative 0 %   Basophils Absolute 0.0 0.0 - 0.1 K/uL   RBC Morphology NRBC'S PRESENT    Immature Granulocytes 1 %   Abs Immature Granulocytes 0.12 (H) 0.00 - 0.07 K/uL   Carollee Massed Bodies PRESENT    Polychromasia MARKED    Sickle Cells MARKED    Target Cells PRESENT   Reticulocytes     Status: Abnormal   Collection Time: 10/08/19 10:21 AM  Result Value Ref Range   Retic Ct Pct 22.9 (H) 0.4 - 3.1 %   RBC. 2.39 (L) 4.22 - 5.81 MIL/uL   Retic Count, Absolute 564.9 (H) 19.0 - 186.0 K/uL   Immature Retic Fract 39.3 (H) 2.3 - 15.9 %    Imaging results:  No results found.   Assessment & Plan: Patient admitted to sickle cell day infusion center for management of pain crisis Patient is opiate tolerant Dilaudid 1 mg IV Oxycodone 10 mg by mouth x1 Toradol 15 mg IV x1 Tylenol 1000 mg by mouth x1 Review CBC with differential, reticulocytes, UDS, CMP as results become available. Pain intensity will be reevaluated in context of functioning in relationship to baseline as his care progresses. If pain intensity remains elevated, transition  to inpatient services for higher level of care.   Nolon Nations  APRN, MSN, FNP-C Patient [facility] [address] [place], [place] [address] [phone]  10/08/2019, 7:24 PM

## 2019-10-22 ENCOUNTER — Telehealth: Payer: Self-pay | Admitting: Family Medicine

## 2019-10-22 ENCOUNTER — Other Ambulatory Visit: Payer: Self-pay | Admitting: Family Medicine

## 2019-10-22 DIAGNOSIS — Z79891 Long term (current) use of opiate analgesic: Secondary | ICD-10-CM

## 2019-10-22 DIAGNOSIS — D571 Sickle-cell disease without crisis: Secondary | ICD-10-CM

## 2019-10-22 MED ORDER — OXYCODONE HCL 10 MG PO TABS: 10.0000 mg | ORAL_TABLET | ORAL | 0 refills | Status: DC | PRN

## 2019-10-22 MED ORDER — IBUPROFEN 800 MG PO TABS: ORAL_TABLET | ORAL | 6 refills | Status: DC

## 2019-10-22 MED ORDER — FOLIC ACID 1 MG PO TABS: 1.0000 mg | ORAL_TABLET | Freq: Every day | ORAL | 6 refills | Status: DC

## 2019-10-22 NOTE — Telephone Encounter (Signed)
done

## 2019-11-06 ENCOUNTER — Emergency Department (HOSPITAL_COMMUNITY)
Admission: EM | Admit: 2019-11-06 | Discharge: 2019-11-06 | Disposition: A | Discharge status: Home / Self Care | Payer: Self-pay | Attending: Emergency Medicine | Admitting: Emergency Medicine

## 2019-11-06 ENCOUNTER — Other Ambulatory Visit: Payer: Self-pay | Admitting: Emergency Medicine

## 2019-11-06 ENCOUNTER — Other Ambulatory Visit: Payer: Self-pay

## 2019-11-06 DIAGNOSIS — Z5321 Procedure and treatment not carried out due to patient leaving prior to being seen by health care provider: Secondary | ICD-10-CM | POA: Insufficient documentation

## 2019-11-06 DIAGNOSIS — D5700 Hb-SS disease with crisis, unspecified: Secondary | ICD-10-CM | POA: Insufficient documentation

## 2019-11-06 LAB — COMPREHENSIVE METABOLIC PANEL
ALT: 23 U/L (ref 0–44)
AST: 35 U/L (ref 15–41)
Albumin: 4.9 g/dL (ref 3.5–5.0)
Alkaline Phosphatase: 79 U/L (ref 38–126)
Anion gap: 7 (ref 5–15)
BUN: 13 mg/dL (ref 6–20)
CO2: 27 mmol/L (ref 22–32)
Calcium: 9.5 mg/dL (ref 8.9–10.3)
Chloride: 104 mmol/L (ref 98–111)
Creatinine, Ser: 0.87 mg/dL (ref 0.61–1.24)
GFR calc Af Amer: >60 mL/min (ref >60)
GFR calc non Af Amer: >60 mL/min (ref >60)
Glucose, Bld: 101 mg/dL — ABNORMAL HIGH (ref 70–99)
Potassium: 3.9 mmol/L (ref 3.5–5.1)
Sodium: 138 mmol/L (ref 135–145)
Total Bilirubin: 3 mg/dL — ABNORMAL HIGH (ref 0.3–1.2)
Total Protein: 8.5 g/dL — ABNORMAL HIGH (ref 6.5–8.1)

## 2019-11-06 LAB — CBC WITH DIFFERENTIAL/PLATELET
Abs Immature Granulocytes: 0.08 10*3/uL — ABNORMAL HIGH (ref 0.00–0.07)
Basophils Absolute: 0 10*3/uL (ref 0.0–0.1)
Basophils Relative: 0 %
Eosinophils Absolute: 0.3 10*3/uL (ref 0.0–0.5)
Eosinophils Relative: 2 %
HCT: 27.4 % — ABNORMAL LOW (ref 39.0–52.0)
Hemoglobin: 9.4 g/dL — ABNORMAL LOW (ref 13.0–17.0)
Immature Granulocytes: 1 %
Lymphocytes Relative: 13 %
Lymphs Abs: 1.9 10*3/uL (ref 0.7–4.0)
MCH: 32.3 pg (ref 26.0–34.0)
MCHC: 34.3 g/dL (ref 30.0–36.0)
MCV: 94.2 fL (ref 80.0–100.0)
Monocytes Absolute: 1.8 10*3/uL — ABNORMAL HIGH (ref 0.1–1.0)
Monocytes Relative: 13 %
Neutro Abs: 10.4 10*3/uL — ABNORMAL HIGH (ref 1.7–7.7)
Neutrophils Relative %: 71 %
Platelets: 418 10*3/uL — ABNORMAL HIGH (ref 150–400)
RBC: 2.91 MIL/uL — ABNORMAL LOW (ref 4.22–5.81)
RDW: 25.2 % — ABNORMAL HIGH (ref 11.5–15.5)
WBC: 14.4 10*3/uL — ABNORMAL HIGH (ref 4.0–10.5)
nRBC: 2.2 % — ABNORMAL HIGH (ref 0.0–0.2)

## 2019-11-06 LAB — RETICULOCYTES
Immature Retic Fract: 38.4 % — ABNORMAL HIGH (ref 2.3–15.9)
RBC.: 2.91 MIL/uL — ABNORMAL LOW (ref 4.22–5.81)
Retic Count, Absolute: 598 10*3/uL — ABNORMAL HIGH (ref 19.0–186.0)
Retic Ct Pct: 20.6 % — ABNORMAL HIGH (ref 0.4–3.1)

## 2019-11-06 MED ORDER — HYDROMORPHONE HCL 2 MG/ML IJ SOLN: 2.0000 mg | INTRAMUSCULAR | Status: DC

## 2019-11-06 MED ORDER — ONDANSETRON HCL 4 MG/2ML IJ SOLN: 4.0000 mg | INTRAMUSCULAR | Status: DC | PRN

## 2019-11-06 MED ORDER — KETOROLAC TROMETHAMINE 30 MG/ML IJ SOLN: 30.0000 mg | INTRAMUSCULAR | Status: DC

## 2019-11-06 MED ORDER — SODIUM CHLORIDE 0.9% FLUSH: 3.0000 mL | Freq: Once | INTRAVENOUS | Status: DC

## 2019-11-06 MED ORDER — DIPHENHYDRAMINE HCL 50 MG/ML IJ SOLN: 25.0000 mg | Freq: Once | INTRAMUSCULAR | Status: DC

## 2019-11-06 NOTE — ED Triage Notes (Signed)
Pt arrived via POV, c/o worsening sickle cell pain since last Saturday (12/5). Patient states the pain started in his right hand, but has now radiated to his entire right side. Patient rates his pain 10/10. A&Ox4 and ambulatory.

## 2019-11-06 NOTE — ED Notes (Signed)
Patient left AMA due to long wait time.

## 2019-11-14 ENCOUNTER — Telehealth: Payer: Self-pay | Admitting: Family Medicine

## 2019-11-14 ENCOUNTER — Other Ambulatory Visit: Payer: Self-pay | Admitting: Family Medicine

## 2019-11-14 DIAGNOSIS — D571 Sickle-cell disease without crisis: Secondary | ICD-10-CM

## 2019-11-14 DIAGNOSIS — Z79891 Long term (current) use of opiate analgesic: Secondary | ICD-10-CM

## 2019-11-14 MED ORDER — OXYCODONE HCL 10 MG PO TABS: 10.0000 mg | ORAL_TABLET | ORAL | 0 refills | Status: DC | PRN

## 2019-11-18 NOTE — Telephone Encounter (Signed)
done

## 2019-12-01 ENCOUNTER — Other Ambulatory Visit: Payer: Self-pay | Admitting: Family Medicine

## 2019-12-01 ENCOUNTER — Telehealth: Payer: Self-pay | Admitting: Family Medicine

## 2019-12-01 ENCOUNTER — Ambulatory Visit: Payer: Self-pay | Admitting: Family Medicine

## 2019-12-01 DIAGNOSIS — D571 Sickle-cell disease without crisis: Secondary | ICD-10-CM

## 2019-12-01 DIAGNOSIS — Z79891 Long term (current) use of opiate analgesic: Secondary | ICD-10-CM

## 2019-12-01 MED ORDER — OXYCODONE HCL 10 MG PO TABS: 10.0000 mg | ORAL_TABLET | ORAL | 0 refills | Status: DC | PRN

## 2019-12-01 MED FILL — HYDROXYUREA 500 MG CAPSULE: 500 | 30 days supply | Qty: 90 | Fill #1

## 2019-12-01 MED FILL — LISINOPRIL 2.5 MG TABLET: 2.5 | 30 days supply | Qty: 30 | Fill #1

## 2019-12-01 MED FILL — FOLIC ACID 1 MG TABS: 1 | 30 days supply | Qty: 30 | Fill #0

## 2019-12-01 MED FILL — IBUPROFEN 800 MG TABLET: 800 | 10 days supply | Qty: 30 | Fill #0

## 2019-12-01 NOTE — Telephone Encounter (Signed)
Done

## 2019-12-10 MED FILL — HYDROXYUREA 500 MG CAPSULE: 500 | 30 days supply | Qty: 90 | Fill #0

## 2019-12-17 ENCOUNTER — Other Ambulatory Visit: Payer: Self-pay

## 2019-12-17 ENCOUNTER — Encounter: Payer: Self-pay | Admitting: Family Medicine

## 2019-12-17 ENCOUNTER — Ambulatory Visit (INDEPENDENT_AMBULATORY_CARE_PROVIDER_SITE_OTHER): Payer: Self-pay | Admitting: Family Medicine

## 2019-12-17 VITALS — BP 128/72 | HR 106 | Temp 98.1°F | Ht 71.0 in | Wt 176.0 lb

## 2019-12-17 DIAGNOSIS — D571 Sickle-cell disease without crisis: Secondary | ICD-10-CM

## 2019-12-17 DIAGNOSIS — D5700 Hb-SS disease with crisis, unspecified: Secondary | ICD-10-CM

## 2019-12-17 DIAGNOSIS — Z09 Encounter for follow-up examination after completed treatment for conditions other than malignant neoplasm: Secondary | ICD-10-CM

## 2019-12-17 DIAGNOSIS — Z79891 Long term (current) use of opiate analgesic: Secondary | ICD-10-CM

## 2019-12-17 LAB — POCT URINALYSIS DIPSTICK
Bilirubin, UA: NEGATIVE
Glucose, UA: NEGATIVE
Ketones, UA: NEGATIVE
Leukocytes, UA: NEGATIVE
Nitrite, UA: NEGATIVE
Protein, UA: POSITIVE — AB
Spec Grav, UA: 1.02 (ref 1.010–1.025)
Urobilinogen, UA: 1 E.U./dL
pH, UA: 5.5 (ref 5.0–8.0)

## 2019-12-17 MED ORDER — OXYCODONE HCL 10 MG PO TABS: 10.0000 mg | ORAL_TABLET | ORAL | 0 refills | Status: DC | PRN

## 2019-12-17 NOTE — Progress Notes (Signed)
[name]    Established Patient Office Visit  Subjective:  Patient ID: Jerry [name], male    DOB: [birth year]  Age: 34 y.o. MRN: [id number]  CC:  Chief Complaint  Patient presents with  . Follow-up    Sickle Cell  . Hospitalization Follow-up    ED 11/06/2019    HPI Jerry [name] is a 34 year old male who presents for Follow Up today.   Past Medical History:  Diagnosis Date  . Sickle cell anemia (HCC)    Current Status: This will be Jerry [name] initial office visit with me. Jerry [name] was previously seeing Lanae Boast, NP for her PCP needs. Since her last office visit, she is doing well with no complaints. She states that she has pain in her arms, legs and back. She rates her pain today at 2/10. She has not had a hospital visit for Sickle Cell Crisis since 10/08/2019 where she was treated and discharged the same day. She is currently taking all medications as prescribed and staying well hydrated. She reports occasional nausea, constipation, dizziness and headaches. She denies fevers, chills, recent infections, weight loss, and night sweats. She has not had any visual changes, and falls. No chest pain, heart palpitations, cough and shortness of breath reported. No reports of GI problems such as vomiting, and diarrhea. She has no reports of blood in stools, dysuria and hematuria. No depression or anxiety reported today. Jerry [name] denies suicidal ideations, homicidal ideations, or auditory hallucinations.   History reviewed. No pertinent surgical history.  Family History  Problem Relation Age of Onset  . Sickle cell trait Mother     Social History   Socioeconomic History  . Marital status: Single    Spouse name: Not on file  . Number of children: Not on file  . Years of education: Not on file  . Highest education level: Not on file  Occupational History  . Not on file  Tobacco Use  . Smoking status: Never Smoker  .  Smokeless tobacco: Never Used  Substance and Sexual Activity  . Alcohol use: No  . Drug use: Yes    Types: Marijuana    Comment: occasionally  . Sexual activity: Yes    Birth control/protection: Condom  Other Topics Concern  . Not on file  Social History Narrative  . Not on file   Social Determinants of Health   Financial Resource Strain:   . Difficulty of Paying Living Expenses: Not on file  Food Insecurity:   . Worried About Charity fundraiser in the Last Year: Not on file  . Ran Out of Food in the Last Year: Not on file  Transportation Needs:   . Lack of Transportation (Medical): Not on file  . Lack of Transportation (Non-Medical): Not on file  Physical Activity:   . Days of Exercise per Week: Not on file  . Minutes of Exercise per Session: Not on file  Stress:   . Feeling of Stress : Not on file  Social Connections:   . Frequency of Communication with Friends and Family: Not on file  . Frequency of Social Gatherings with Friends and Family: Not on file  . Attends Religious Services: Not on file  . Active Member of Clubs or Organizations: Not on file  . Attends Archivist Meetings: Not on file  . Marital Status: Not on file  Intimate Partner Violence:   . Fear of Current or Ex-Partner: Not on  file  . Emotionally Abused: Not on file  . Physically Abused: Not on file  . Sexually Abused: Not on file    Outpatient Medications Prior to Visit  Medication Sig Dispense Refill  . folic acid (FOLVITE) 1 MG tablet Take 1 tablet (1 mg total) by mouth daily. 30 tablet 6  . hydroxyurea (HYDREA) 500 MG capsule Take 3 capsules (1,500 mg total) by mouth daily. May take with food to minimize GI side effects. 90 capsule 6  . ibuprofen (ADVIL) 800 MG tablet Take 1 tablet, 3 times a day as needed. (Patient taking differently: Take 800 mg by mouth every 8 (eight) hours as needed for moderate pain. ) 30 tablet 6  . lisinopril (ZESTRIL) 2.5 MG tablet Take 1 tablet (2.5 mg total)  by mouth daily. 30 tablet 6  . Vitamin D, Ergocalciferol, (DRISDOL) 1.25 MG (50000 UT) CAPS capsule Take 1 capsule (50,000 Units total) by mouth every 7 (seven) days. (Patient not taking: Reported on 12/17/2019) 30 capsule 1   No facility-administered medications prior to visit.    No Known Allergies  ROS Review of Systems  Constitutional: Negative.   HENT: Negative.   Eyes: Negative.   Respiratory: Negative.   Cardiovascular: Negative.   Gastrointestinal: Positive for constipation (occasional ) and nausea (occasional ).  Endocrine: Negative.   Genitourinary: Negative.   Musculoskeletal: Positive for arthralgias (generalized).  Allergic/Immunologic: Negative.   Neurological: Positive for dizziness (occasional ) and headaches (occasional ).  Hematological: Negative.   Psychiatric/Behavioral: Negative.       Objective:    Physical Exam  Constitutional: Jerry [name] is oriented to person, place, and time. Jerry [name] appears well-developed and well-nourished.  HENT:  Head: Normocephalic and atraumatic.  Right Ear: External ear normal.  Left Ear: External ear normal.  Mouth/Throat: Oropharynx is clear and moist.  Eyes: Conjunctivae are normal.  Cardiovascular: Normal rate, regular rhythm, normal heart sounds and intact distal pulses.  Pulmonary/Chest: Effort normal and breath sounds normal.  Abdominal: Soft. Bowel sounds are normal.  Musculoskeletal:        General: Normal range of motion.     Cervical back: Normal range of motion and neck supple.  Neurological: Jerry [name] is alert and oriented to person, place, and time. Jerry [name] has normal reflexes.  Skin: Skin is warm and dry.  Psychiatric: Jerry [name] has a normal mood and affect. His behavior is normal. Judgment and thought content normal.  Nursing note and vitals reviewed.   BP 128/72   Pulse (!) 106   Temp 98.1 F (36.7 C) (Oral)   Ht 5\' 11"  (1.803 m)   Wt 176 lb (79.8 kg)   SpO2 (!) 87%   BMI 24.55 kg/m  Wt Readings from Last 3 Encounters:    12/17/19 176 lb (79.8 kg)  11/06/19 174 lb (78.9 kg)  10/06/19 172 lb 3.2 oz (78.1 kg)     Health Maintenance Due  Topic Date Due  . INFLUENZA VACCINE  06/28/2019    There are no preventive care reminders to display for this patient.  Lab Results  Component Value Date   TSH 0.809 07/04/2019   Lab Results  Component Value Date   WBC 14.4 (H) 11/06/2019   HGB 9.4 (L) 11/06/2019   HCT 27.4 (L) 11/06/2019   MCV 94.2 11/06/2019   PLT 418 (H) 11/06/2019   Lab Results  Component Value Date   NA 138 11/06/2019   K 3.9 11/06/2019   CO2 27 11/06/2019   GLUCOSE 101 (H) 11/06/2019  BUN 13 11/06/2019   CREATININE 0.87 11/06/2019   BILITOT 3.0 (H) 11/06/2019   ALKPHOS 79 11/06/2019   AST 35 11/06/2019   ALT 23 11/06/2019   PROT 8.5 (H) 11/06/2019   ALBUMIN 4.9 11/06/2019   CALCIUM 9.5 11/06/2019   ANIONGAP 7 11/06/2019   Lab Results  Component Value Date   CHOL 130 01/09/2017   Lab Results  Component Value Date   HDL 46 01/09/2017   Lab Results  Component Value Date   LDLCALC 68 01/09/2017   Lab Results  Component Value Date   TRIG 81 01/09/2017   Lab Results  Component Value Date   CHOLHDL 2.8 01/09/2017   No results found for: HGBA1C    Assessment & Plan:   1. Hospital discharge follow-up  2. Sickle cell crisis (HCC) - POCT urinalysis dipstick  3. Chronic prescription opiate use - [id number] 11+Oxyco+Alc+Crt-Bund - Oxycodone HCl 10 MG TABS; Take 1 tablet (10 mg total) by mouth every 4 (four) hours as needed for up to 15 days.  Dispense: 90 tablet; Refill: 0  4. Hb-SS disease without crisis [facility]) Jerry [name] is doing well today. Jerry [name] will continue to take pain medications as prescribed; will continue to avoid extreme heat and cold; will continue to eat a healthy diet and drink at least 64 ounces of water daily; continue stool softener as needed; will avoid colds and flu; will continue to get plenty of sleep and rest; will continue to avoid high stressful situations  and remain infection free; will continue Folic Acid 1 mg daily to avoid sickle cell crisis. Continue to follow up with Hematologist as needed.  - Oxycodone HCl 10 MG TABS; Take 1 tablet (10 mg total) by mouth every 4 (four) hours as needed for up to 15 days.  Dispense: 90 tablet; Refill: 0  5. Follow up Jerry [name] will follow up with Thad Ranger, NP and 2 months.   Meds ordered this encounter  Medications  . Oxycodone HCl 10 MG TABS    Sig: Take 1 tablet (10 mg total) by mouth every 4 (four) hours as needed for up to 15 days.    Dispense:  90 tablet    Refill:  0    Order Specific Question:   Supervising Provider    Answer:   Quentin Angst [id number]    Orders Placed This Encounter  Procedures  . [id number] 11+Oxyco+Alc+Crt-Bund  . POCT urinalysis dipstick   Referral Orders  No referral(s) requested today    Raliegh Ip,  MSN, FNP-BC [facility] [address] [place], [place] [address] [phone] fax  Problem List Items Addressed This Visit      Other   Chronic prescription opiate use   Relevant Medications   Oxycodone HCl 10 MG TABS   Other Relevant Orders   [id number] 11+Oxyco+Alc+Crt-Bund (Completed)   Hb-SS disease without crisis (HCC)   Relevant Medications   Oxycodone HCl 10 MG TABS   Sickle cell crisis (HCC)   Relevant Orders   POCT urinalysis dipstick (Completed)    Other Visit Diagnoses    Hospital discharge follow-up    -  Primary   Follow up          Meds ordered this encounter  Medications  . Oxycodone HCl 10 MG TABS    Sig: Take 1 tablet (10 mg total) by mouth every 4 (four) hours as needed for up to 15 days.    Dispense:  90 tablet    Refill:  0    Order Specific Question:   Supervising Provider    Answer:   Quentin Angst [[id number]]    Follow-up: No follow-ups on file.    Kallie Locks, FNP

## 2019-12-22 LAB — DRUG SCREEN 764883 11+OXYCO+ALC+CRT-BUND
Amphetamines, Urine: NEGATIVE ng/mL
BENZODIAZ UR QL: NEGATIVE ng/mL
Barbiturate: NEGATIVE ng/mL
Cocaine (Metabolite): NEGATIVE ng/mL
Creatinine: 145.8 mg/dL (ref 20.0–300.0)
Ethanol: NEGATIVE %
Meperidine: NEGATIVE ng/mL
Methadone Screen, Urine: NEGATIVE ng/mL
OPIATE SCREEN URINE: NEGATIVE ng/mL
Phencyclidine: NEGATIVE ng/mL
Propoxyphene: NEGATIVE ng/mL
Tramadol: NEGATIVE ng/mL
pH, Urine: 5.6 (ref 4.5–8.9)

## 2019-12-22 LAB — CANNABINOID CONFIRMATION, UR
CANNABINOIDS: POSITIVE — AB
Carboxy THC GC/MS Conf: 157 ng/mL

## 2019-12-22 LAB — OXYCODONE/OXYMORPHONE, CONFIRM
OXYCODONE/OXYMORPH: POSITIVE — AB
OXYCODONE: 499 ng/mL
OXYCODONE: POSITIVE — AB
OXYMORPHONE: NEGATIVE

## 2020-01-01 ENCOUNTER — Other Ambulatory Visit: Payer: Self-pay | Admitting: Family Medicine

## 2020-01-01 ENCOUNTER — Telehealth: Payer: Self-pay | Admitting: Family Medicine

## 2020-01-01 DIAGNOSIS — D571 Sickle-cell disease without crisis: Secondary | ICD-10-CM

## 2020-01-01 DIAGNOSIS — Z79891 Long term (current) use of opiate analgesic: Secondary | ICD-10-CM

## 2020-01-01 MED ORDER — OXYCODONE HCL 10 MG PO TABS: 10.0000 mg | ORAL_TABLET | ORAL | 0 refills | Status: DC | PRN

## 2020-01-01 NOTE — Telephone Encounter (Signed)
Pt wants medication refill for oxycodone and Ibuprofen. Pt low on hydroxyurea and folic acid. [facility]

## 2020-01-02 ENCOUNTER — Other Ambulatory Visit: Payer: Self-pay

## 2020-01-02 ENCOUNTER — Telehealth: Payer: Self-pay | Admitting: Family Medicine

## 2020-01-02 DIAGNOSIS — R801 Persistent proteinuria, unspecified: Secondary | ICD-10-CM

## 2020-01-02 DIAGNOSIS — D571 Sickle-cell disease without crisis: Secondary | ICD-10-CM

## 2020-01-02 MED ORDER — HYDROXYUREA 500 MG PO CAPS: 1500.0000 mg | ORAL_CAPSULE | Freq: Every day | ORAL | 6 refills | Status: DC

## 2020-01-02 MED ORDER — FOLIC ACID 1 MG PO TABS: 1.0000 mg | ORAL_TABLET | Freq: Every day | ORAL | 6 refills | Status: DC

## 2020-01-02 MED ORDER — LISINOPRIL 2.5 MG PO TABS: 2.5000 mg | ORAL_TABLET | Freq: Every day | ORAL | 6 refills | Status: DC

## 2020-01-02 MED FILL — LISINOPRIL 2.5 MG TABLET: 2.5 | 30 days supply | Qty: 30 | Fill #0

## 2020-01-02 MED FILL — FOLIC ACID 1 MG TABS: 1 | 30 days supply | Qty: 30 | Fill #0

## 2020-01-02 NOTE — Telephone Encounter (Signed)
stroud

## 2020-01-16 ENCOUNTER — Other Ambulatory Visit: Payer: Self-pay | Admitting: Family Medicine

## 2020-01-16 ENCOUNTER — Telehealth: Payer: Self-pay | Admitting: Family Medicine

## 2020-01-16 DIAGNOSIS — Z79891 Long term (current) use of opiate analgesic: Secondary | ICD-10-CM

## 2020-01-16 DIAGNOSIS — D571 Sickle-cell disease without crisis: Secondary | ICD-10-CM

## 2020-01-16 MED ORDER — OXYCODONE HCL 10 MG PO TABS: 10.0000 mg | ORAL_TABLET | ORAL | 0 refills | Status: DC | PRN

## 2020-01-16 NOTE — Telephone Encounter (Signed)
Pt called requesting refill on oxycodone. Pt wanted to make sure medication is sent to the [facility] on [address].

## 2020-01-30 ENCOUNTER — Telehealth: Payer: Self-pay | Admitting: Nurse Practitioner

## 2020-01-30 ENCOUNTER — Other Ambulatory Visit: Payer: Self-pay | Admitting: Nurse Practitioner

## 2020-01-30 DIAGNOSIS — D571 Sickle-cell disease without crisis: Secondary | ICD-10-CM

## 2020-01-30 DIAGNOSIS — Z79891 Long term (current) use of opiate analgesic: Secondary | ICD-10-CM

## 2020-01-30 MED ORDER — OXYCODONE HCL 10 MG PO TABS: 10.0000 mg | ORAL_TABLET | ORAL | 0 refills | Status: DC | PRN

## 2020-01-30 NOTE — Telephone Encounter (Signed)
Rx sent 

## 2020-02-12 ENCOUNTER — Ambulatory Visit (INDEPENDENT_AMBULATORY_CARE_PROVIDER_SITE_OTHER): Payer: Self-pay | Admitting: Nurse Practitioner

## 2020-02-12 ENCOUNTER — Other Ambulatory Visit: Payer: Self-pay

## 2020-02-12 ENCOUNTER — Encounter: Payer: Self-pay | Admitting: Nurse Practitioner

## 2020-02-12 VITALS — BP 119/60 | HR 82 | Temp 98.1°F | Ht 71.0 in | Wt 176.6 lb

## 2020-02-12 DIAGNOSIS — D571 Sickle-cell disease without crisis: Secondary | ICD-10-CM

## 2020-02-12 DIAGNOSIS — R801 Persistent proteinuria, unspecified: Secondary | ICD-10-CM

## 2020-02-12 DIAGNOSIS — Z79891 Long term (current) use of opiate analgesic: Secondary | ICD-10-CM

## 2020-02-12 LAB — POCT URINALYSIS DIPSTICK
Bilirubin, UA: NEGATIVE
Glucose, UA: NEGATIVE
Ketones, UA: NEGATIVE
Leukocytes, UA: NEGATIVE
Nitrite, UA: NEGATIVE
Protein, UA: POSITIVE — AB
Spec Grav, UA: 1.02 (ref 1.010–1.025)
Urobilinogen, UA: 1 E.U./dL
pH, UA: 6 (ref 5.0–8.0)

## 2020-02-12 MED ORDER — OXYCODONE HCL 10 MG PO TABS: 10.0000 mg | ORAL_TABLET | ORAL | 0 refills | Status: DC | PRN

## 2020-02-12 NOTE — Progress Notes (Signed)
Established Patient Office Visit  Subjective:  Patient ID: Jerry [name], male    DOB: 11/07/1986  Age: 34 y.o. MRN: [id number]  CC:  Chief Complaint  Patient presents with  . Follow-up    Sickle Cell    HPI [name] presents for follow up. He  has a past medical history of Sickle cell anemia (HCC).  He works full-time.  He admits that his primary areas of pain are generally in his ribs, back and arm.  He sometimes has pain in his head and legs.  Today he rates his pain about a 3 out of 10.  He denies fever, headache, cough, wheezing, shortness of breath, chest pains, abdominal pain, hip pain, or leg pain.  He admits that he uses the oxycodone regularly for pain.  He maintain adequate hydration with water.  He does not use the lisinopril, folic acid or the hydroxyurea regularly.  He admits that he is trying to really get in touch with his body and see what causes his pain and what his best treatment.  He admits that he does not like the lisinopril because of why he is taking it.    Past Medical History:  Diagnosis Date  . Sickle cell anemia (HCC)     History reviewed. No pertinent surgical history.  Family History  Problem Relation Age of Onset  . Sickle cell trait Mother     Social History   Socioeconomic History  . Marital status: Single    Spouse name: Not on file  . Number of children: Not on file  . Years of education: Not on file  . Highest education level: Not on file  Occupational History  . Not on file  Tobacco Use  . Smoking status: Never Smoker  . Smokeless tobacco: Never Used  Substance and Sexual Activity  . Alcohol use: No  . Drug use: Yes    Types: Marijuana    Comment: occasionally  . Sexual activity: Yes    Birth control/protection: Condom  Other Topics Concern  . Not on file  Social History Narrative  . Not on file   Social Determinants of Health   Financial Resource Strain:   . Difficulty of Paying Living Expenses:   Food  Insecurity:   . Worried About Programme researcher, broadcasting/film/video in the Last Year:   . Barista in the Last Year:   Transportation Needs:   . Freight forwarder (Medical):   Marland Kitchen Lack of Transportation (Non-Medical):   Physical Activity:   . Days of Exercise per Week:   . Minutes of Exercise per Session:   Stress:   . Feeling of Stress :   Social Connections:   . Frequency of Communication with Friends and Family:   . Frequency of Social Gatherings with Friends and Family:   . Attends Religious Services:   . Active Member of Clubs or Organizations:   . Attends Banker Meetings:   Marland Kitchen Marital Status:   Intimate Partner Violence:   . Fear of Current or Ex-Partner:   . Emotionally Abused:   Marland Kitchen Physically Abused:   . Sexually Abused:     Outpatient Medications Prior to Visit  Medication Sig Dispense Refill  . folic acid (FOLVITE) 1 MG tablet Take 1 tablet (1 mg total) by mouth daily. 30 tablet 6  . hydroxyurea (HYDREA) 500 MG capsule Take 3 capsules (1,500 mg total) by mouth daily. May take with food to minimize GI side  effects. 90 capsule 6  . ibuprofen (ADVIL) 800 MG tablet Take 1 tablet, 3 times a day as needed. (Patient taking differently: Take 800 mg by mouth every 8 (eight) hours as needed for moderate pain. ) 30 tablet 6  . lisinopril (ZESTRIL) 2.5 MG tablet Take 1 tablet (2.5 mg total) by mouth daily. 30 tablet 6  . Vitamin D, Ergocalciferol, (DRISDOL) 1.25 MG (50000 UT) CAPS capsule Take 1 capsule (50,000 Units total) by mouth every 7 (seven) days. (Patient not taking: Reported on 12/17/2019) 30 capsule 1  . Oxycodone HCl 10 MG TABS Take 1 tablet (10 mg total) by mouth every 4 (four) hours as needed for up to 15 days. 90 tablet 0   No facility-administered medications prior to visit.    No Known Allergies  ROS Review of Systems  Constitutional: Negative.   HENT: Negative.   Eyes: Negative.   Respiratory: Negative.   Cardiovascular: Negative.   Gastrointestinal:  Negative.   Endocrine: Negative.   Genitourinary: Negative.   Musculoskeletal: Negative.   Skin: Negative.   Allergic/Immunologic: Negative.   Neurological: Negative.   Psychiatric/Behavioral: Negative.       Objective:    Physical Exam  Constitutional: He is oriented to person, place, and time. He appears well-developed and well-nourished.  HENT:  Head: Normocephalic.  Cardiovascular: Normal rate, regular rhythm and normal heart sounds.  Pulmonary/Chest: Effort normal and breath sounds normal.  Abdominal: Soft. Bowel sounds are normal.  Musculoskeletal:        General: Normal range of motion.     Cervical back: Normal range of motion.  Neurological: He is alert and oriented to person, place, and time. He has normal reflexes.  Skin: Skin is warm and dry.  Psychiatric: He has a normal mood and affect. His behavior is normal. Judgment and thought content normal.    BP 119/60   Pulse 82   Temp 98.1 F (36.7 C) (Oral)   Ht 5\' 11"  (9.528 m)   Wt 176 lb 9.6 oz (80.1 kg)   SpO2 97%   BMI 24.63 kg/m  Wt Readings from Last 3 Encounters:  02/12/20 176 lb 9.6 oz (80.1 kg)  12/17/19 176 lb (79.8 kg)  11/06/19 174 lb (78.9 kg)     Health Maintenance Due  Topic Date Due  . INFLUENZA VACCINE  06/28/2019    There are no preventive care reminders to display for this patient.  Lab Results  Component Value Date   TSH 0.809 07/04/2019   Lab Results  Component Value Date   WBC 14.4 (H) 11/06/2019   HGB 9.4 (L) 11/06/2019   HCT 27.4 (L) 11/06/2019   MCV 94.2 11/06/2019   PLT 418 (H) 11/06/2019   Lab Results  Component Value Date   NA 138 11/06/2019   K 3.9 11/06/2019   CO2 27 11/06/2019   GLUCOSE 101 (H) 11/06/2019   BUN 13 11/06/2019   CREATININE 0.87 11/06/2019   BILITOT 3.0 (H) 11/06/2019   ALKPHOS 79 11/06/2019   AST 35 11/06/2019   ALT 23 11/06/2019   PROT 8.5 (H) 11/06/2019   ALBUMIN 4.9 11/06/2019   CALCIUM 9.5 11/06/2019   ANIONGAP 7 11/06/2019   Lab  Results  Component Value Date   CHOL 130 01/09/2017   Lab Results  Component Value Date   HDL 46 01/09/2017   Lab Results  Component Value Date   LDLCALC 68 01/09/2017   Lab Results  Component Value Date   TRIG 81 01/09/2017  Lab Results  Component Value Date   CHOLHDL 2.8 01/09/2017   No results found for: HGBA1C    Assessment & Plan:   Problem List Items Addressed This Visit      High   Hb-SS disease without crisis (HCC) - Primary   Relevant Medications   Oxycodone HCl 10 MG TABS (Start on 02/16/2020)   Other Relevant Orders   POCT urinalysis dipstick (Completed)   Persistent proteinuria     Medium   Chronic prescription opiate use   Relevant Medications   Oxycodone HCl 10 MG TABS (Start on 02/16/2020)      Meds ordered this encounter  Medications  . Oxycodone HCl 10 MG TABS    Sig: Take 1 tablet (10 mg total) by mouth every 4 (four) hours as needed for up to 15 days.    Dispense:  90 tablet    Refill:  0    May refill 02/16/20    Order Specific Question:   Supervising Provider    Answer:   Quentin Angst [[id number]]    Follow-up: Return in about 2 months (around 04/13/2020).    Barbette Merino, NP

## 2020-02-12 NOTE — Patient Instructions (Signed)
Folic Acid; Lactobacillus Oral Capsules What is this medicine? FOLIC ACID; LACTOBACILLUS (FOE lik AS id; lak toh buh SIL uhs) is a dietary supplement. It is used to provide nutrition and create a normal balance of bacteria in the colon. This medicine may be used for other purposes; ask your health care provider or pharmacist if you have questions. COMMON BRAND NAME(S): RESTORA RX What should I tell my health care provider before I take this medicine? They need to know if you have any of these conditions:  alcoholism or alcohol cirrhosis  chronic disease  diabetes mellitus  immune system problems  pernicious anemia  vitamin B12 deficient anemia  an unusual or allergic reaction to folic acid, Lactobacillus, other B vitamins, other drugs, foods, dyes, or preservatives  pregnant or trying to get pregnant  breast-feeding How should I use this medicine? Take this drug by mouth. Take it as directed on the label at the same time every day. Take this drug in between meals. Talk to your pediatrician about the use of this drug in children. While this drug may be prescribed for children as young as 12 for select conditions, precautions do apply. Overdosage: If you think you have taken too much of this medicine contact a poison control center or emergency room at once. NOTE: This medicine is only for you. Do not share this medicine with others. What if I miss a dose? If you miss a dose, take it as soon as you can. If it is almost time for your next dose, take only that dose. Do not take double or extra doses. What may interact with this medicine? Interactions have not been described. This list may not describe all possible interactions. Give your health care provider a list of all the medicines, herbs, non-prescription drugs, or dietary supplements you use. Also tell them if you smoke, drink alcohol, or use illegal drugs. Some items may interact with your medicine. What should I watch for while  using this medicine? Visit your health care provider for regular checks on your progress. Tell your health care provider if your symptoms do not start to get better or if they get worse. Dietary supplements are not regulated like drugs. Rigid quality control standards are not required. The purity and strength of the product can vary. This product is not meant to diagnose, treat, cure, or prevent any disease. The Food and Drug Administration suggests the following to help you protect yourself:  Read the product labels. Follow the directions on the label.  Natural does not mean it is safe for humans.  Use products that include "USP" after the ingredient name. This means that the standards of the [place] Pharmacopoeia were followed.  Products made or sold by a nationally known food or drug company are more likely to be made under tight controls. You can write to the company about how the product was made. What side effects may I notice from receiving this medicine? Side effects that you should report to your doctor or health care professional as soon as possible:  allergic reactions (skin rash, itching or hives; swelling of the face, lips, or tongue) Side effects that usually do not require medical attention (report these to your doctor or health care professional if they continue or are bothersome):  nausea  stomach upset This list may not describe all possible side effects. Call your doctor for medical advice about side effects. You may report side effects to FDA at 1-800-FDA-1088. Where should I keep my medicine?  Keep out of the reach of children and pets. Store at room temperature between 15 and 30 degrees C (59 and 86 degrees F). Protect from light. Keep the container tightly closed. Throw away any unused drug after the expiration date. NOTE: This sheet is a summary. It may not cover all possible information. If you have questions about this medicine, talk to your doctor, pharmacist, or health  care provider.  2020 Elsevier/Gold Standard (2019-07-01 09:43:06)

## 2020-03-01 ENCOUNTER — Other Ambulatory Visit: Payer: Self-pay

## 2020-03-01 ENCOUNTER — Ambulatory Visit (INDEPENDENT_AMBULATORY_CARE_PROVIDER_SITE_OTHER): Payer: Self-pay | Admitting: Nurse Practitioner

## 2020-03-01 ENCOUNTER — Encounter: Payer: Self-pay | Admitting: Nurse Practitioner

## 2020-03-01 VITALS — BP 113/59 | HR 78 | Temp 98.9°F | Resp 16 | Ht 71.0 in | Wt 174.0 lb

## 2020-03-01 DIAGNOSIS — Z202 Contact with and (suspected) exposure to infections with a predominantly sexual mode of transmission: Secondary | ICD-10-CM

## 2020-03-01 LAB — POCT URINALYSIS DIP (CLINITEK)
Bilirubin, UA: NEGATIVE
Glucose, UA: NEGATIVE mg/dL
Ketones, POC UA: NEGATIVE mg/dL
Leukocytes, UA: NEGATIVE
Nitrite, UA: NEGATIVE
POC PROTEIN,UA: 100 — AB
Spec Grav, UA: 1.015 (ref 1.010–1.025)
Urobilinogen, UA: 0.2 E.U./dL
pH, UA: 5.5 (ref 5.0–8.0)

## 2020-03-01 MED ORDER — DOXYCYCLINE HYCLATE 100 MG PO CAPS: 100.0000 mg | ORAL_CAPSULE | Freq: Two times a day (BID) | ORAL | 0 refills | Status: AC

## 2020-03-01 NOTE — Patient Instructions (Signed)
Chlamydia, Male    Chlamydia is an STD (sexually transmitted disease). It is a bacterial infection that spreads through sexual contact (is contagious). Chlamydia can occur in different areas of the body, including the tube that moves urine from the bladder out of the body (urethra), the throat, or the rectum. This condition is not difficult to treat. However, if left untreated, chlamydia can lead to more serious health problems.  What are the causes?  Chlamydia is caused by the bacteria Chlamydia trachomatis. It is passed from an infected partner during sexual activity. Chlamydia can spread through contact with the genitals, mouth, or rectum.  What are the signs or symptoms?  In some cases, there may not be any symptoms for this condition (asymptomatic), especially early in the infection. If symptoms develop, they may include:  · Burning when urinating.  · Urinating frequently.  · Pain or swelling in the testicles.  · Watery, mucus-like discharge from the penis.  · Redness, soreness, and swelling (inflammation) of the rectum.  · Bleeding or discharge from the rectum.  · Abdominal pain.  · Itching, burning, or redness in the eyes, or discharge from the eyes.  How is this diagnosed?  This condition may be diagnosed based on:  · Urine tests.  · Swab tests. Depending on your symptoms, your health care provider may use a cotton swab to collect discharge from your urethra or rectum to test for the bacteria.  How is this treated?  This condition is treated with antibiotic medicines.  Follow these instructions at home:  Medicines  · Take over-the-counter and prescription medicines only as told by your health care provider.  · Take your antibiotic medicine as told by your health care provider. Do not stop taking the antibiotic even if you start to feel better.  Sexual activity  · Tell sexual partners about your infection. This includes any oral, anal, or vaginal sex partners you have had within 60 days of when your symptoms  started. Sexual partners should also be treated, even if they have no signs of the disease.  · Do not have sex until you and your sexual partners have completed treatment and your health care provider says it is okay. If your health care provider prescribed you a single dose treatment, wait 7 days after taking the treatment before having sex.  General instructions  · It is your responsibility to get your test results. Ask your health care provider, or the department performing the test, when your results will be ready.  · Get plenty of rest.  · Eat a healthy, well-balanced diet.  · Drink enough fluids to keep your urine clear or pale yellow.  · Keep all follow-up visits as told by your health care provider. This is important. You may need to be tested for infection again 3 months after treatment.  How is this prevented?  The only sure way to prevent chlamydia is to avoid sexual intercourse. However, you can lower your risk by:  · Using latex condoms correctly every time you have sexual intercourse.  · Not having multiple sexual partners.  · Asking if your sexual partner has been tested for STIs and had negative results.  Contact a health care provider if:  · You develop new symptoms or your symptoms do not get better after completing treatment.  · You have a fever or chills.  · You have pain during sexual intercourse.  · You develop new joint pain or swelling near your joints.  · You   Chlamydia is an STD (sexually transmitted disease). It is a bacterial infection that spreads (is contagious) through sexual contact.  This condition is not difficult to treat, however, if left untreated, it can lead to more serious health problems.  In some cases, there may not  be any symptoms for this condition (asymptomatic).  This condition is treated with antibiotic medicines.  Using latex condoms correctly every time you have sexual intercourse can help prevent chlamydia. This information is not intended to replace advice given to you by your health care provider. Make sure you discuss any questions you have with your health care provider. Document Revised: 11/28/2017 Document Reviewed: 10/30/2016 Elsevier Patient Education  2020 Elsevier Inc.  

## 2020-03-01 NOTE — Progress Notes (Addendum)
[place], [place]  [address] Phone:  [phone]   Fax:  [phone]   Established Patient Office Visit  Subjective:  Patient ID: Jerry [name], male    DOB: 18-Jun-1986  Age: 34 y.o. MRN: [id number]  CC:  Chief Complaint  Patient presents with  . Sickle Cell Anemia    follow up. Wants to discuss about information in chart     HPI Jerry [name] presents for evaluation. He  has a past medical history of Sickle cell anemia ([place]).  He is in today for STD exposure. He was evaluated and treated at the health dept. He admits that he is still concern because he maybe infected. He has not been able to obtain is results on the patient portal and this is making him nervous. He would like to have additional testing to verify that he is no longer actively infected. He denies oral lesions, sore throat, pelvic pain, penile discharge, dysuria, nocturia any visible ulcers or lesions. He admits that this has been very stressful.   Past Medical History:  Diagnosis Date  . Sickle cell anemia (HCC)     History reviewed. No pertinent surgical history.  Family History  Problem Relation Age of Onset  . Sickle cell trait Mother     Social History   Socioeconomic History  . Marital status: Single    Spouse name: Not on file  . Number of children: Not on file  . Years of education: Not on file  . Highest education level: Not on file  Occupational History  . Not on file  Tobacco Use  . Smoking status: Never Smoker  . Smokeless tobacco: Never Used  Substance and Sexual Activity  . Alcohol use: No  . Drug use: Yes    Types: Marijuana    Comment: occasionally  . Sexual activity: Yes    Birth control/protection: Condom  Other Topics Concern  . Not on file  Social History Narrative  . Not on file   Social Determinants of Health   Financial Resource Strain:   . Difficulty of Paying Living Expenses:   Food Insecurity:   . Worried  About Charity fundraiser in the Last Year:   . Arboriculturist in the Last Year:   Transportation Needs:   . Film/video editor (Medical):   Marland Kitchen Lack of Transportation (Non-Medical):   Physical Activity:   . Days of Exercise per Week:   . Minutes of Exercise per Session:   Stress:   . Feeling of Stress :   Social Connections:   . Frequency of Communication with Friends and Family:   . Frequency of Social Gatherings with Friends and Family:   . Attends Religious Services:   . Active Member of Clubs or Organizations:   . Attends Archivist Meetings:   Marland Kitchen Marital Status:   Intimate Partner Violence:   . Fear of Current or Ex-Partner:   . Emotionally Abused:   Marland Kitchen Physically Abused:   . Sexually Abused:     Outpatient Medications Prior to Visit  Medication Sig Dispense Refill  . folic acid (FOLVITE) 1 MG tablet Take 1 tablet (1 mg total) by mouth daily. 30 tablet 6  . hydroxyurea (HYDREA) 500 MG capsule Take 3 capsules (1,500 mg total) by mouth daily. May take with food to minimize GI side effects. 90 capsule 6  . ibuprofen (ADVIL) 800 MG tablet Take 1 tablet, 3 times  a day as needed. (Patient taking differently: Take 800 mg by mouth every 8 (eight) hours as needed for moderate pain. ) 30 tablet 6  . lisinopril (ZESTRIL) 2.5 MG tablet Take 1 tablet (2.5 mg total) by mouth daily. 30 tablet 6  . Oxycodone HCl 10 MG TABS Take 1 tablet (10 mg total) by mouth every 4 (four) hours as needed for up to 15 days. 90 tablet 0  . Vitamin D, Ergocalciferol, (DRISDOL) 1.25 MG (50000 UT) CAPS capsule Take 1 capsule (50,000 Units total) by mouth every 7 (seven) days. (Patient not taking: Reported on 12/17/2019) 30 capsule 1   No facility-administered medications prior to visit.    No Known Allergies  ROS Review of Systems  All other systems reviewed and are negative.     Objective:    Physical Exam  Constitutional: He is oriented to person, place, and time. He appears  well-developed and well-nourished.  HENT:  Head: Normocephalic.  Cardiovascular: Normal rate.  Pulmonary/Chest: Effort normal.  Genitourinary:    Penis normal.   Musculoskeletal:        General: Normal range of motion.  Neurological: He is alert and oriented to person, place, and time.  Skin: Skin is warm and dry.  Psychiatric: He has a normal mood and affect. His behavior is normal. Judgment and thought content normal.    BP (!) 113/59 (BP Location: Right Arm, Patient Position: Sitting, Cuff Size: Normal)   Pulse 78   Temp 98.9 F (37.2 C) (Oral)   Resp 16   Ht 5\' 11"  (1.803 m)   Wt 174 lb (78.9 kg)   SpO2 92%   BMI 24.27 kg/m  Wt Readings from Last 3 Encounters:  03/01/20 174 lb (78.9 kg)  02/12/20 176 lb 9.6 oz (80.1 kg)  12/17/19 176 lb (79.8 kg)     There are no preventive care reminders to display for this patient.  There are no preventive care reminders to display for this patient.  Lab Results  Component Value Date   TSH 0.809 07/04/2019   Lab Results  Component Value Date   WBC 14.4 (H) 11/06/2019   HGB 9.4 (L) 11/06/2019   HCT 27.4 (L) 11/06/2019   MCV 94.2 11/06/2019   PLT 418 (H) 11/06/2019   Lab Results  Component Value Date   NA 138 11/06/2019   K 3.9 11/06/2019   CO2 27 11/06/2019   GLUCOSE 101 (H) 11/06/2019   BUN 13 11/06/2019   CREATININE 0.87 11/06/2019   BILITOT 3.0 (H) 11/06/2019   ALKPHOS 79 11/06/2019   AST 35 11/06/2019   ALT 23 11/06/2019   PROT 8.5 (H) 11/06/2019   ALBUMIN 4.9 11/06/2019   CALCIUM 9.5 11/06/2019   ANIONGAP 7 11/06/2019   Lab Results  Component Value Date   CHOL 130 01/09/2017   Lab Results  Component Value Date   HDL 46 01/09/2017   Lab Results  Component Value Date   LDLCALC 68 01/09/2017   Lab Results  Component Value Date   TRIG 81 01/09/2017   Lab Results  Component Value Date   CHOLHDL 2.8 01/09/2017   No results found for: HGBA1C    Assessment & Plan:   Problem List Items  Addressed This Visit    None    Visit Diagnoses    STD exposure    -  Primary   Relevant Orders   POCT URINALYSIS DIP (CLINITEK) (Completed)   STD Screen (8)   Sickle Cell Panel  Ct, Ng, M genitalium NAA, Swab      Meds ordered this encounter  Medications  . doxycycline (VIBRAMYCIN) 100 MG capsule    Sig: Take 1 capsule (100 mg total) by mouth 2 (two) times daily for 7days.    Dispense:  14 capsule    Refill:  0    Order Specific Question:   Supervising Provider    Answer:   Quentin Angst [[id number]]    Follow-up: Return for Appointment As Scheduled.    Barbette Merino, NP

## 2020-03-02 ENCOUNTER — Telehealth: Payer: Self-pay | Admitting: Nurse Practitioner

## 2020-03-02 LAB — CMP14+CBC/D/PLT+FER+RETIC+V...
ALT: 18 IU/L (ref 0–44)
AST: 53 IU/L — ABNORMAL HIGH (ref 0–40)
Albumin/Globulin Ratio: 1.6 (ref 1.2–2.2)
Albumin: 5 g/dL (ref 4.0–5.0)
Alkaline Phosphatase: 86 IU/L (ref 39–117)
BUN/Creatinine Ratio: 13 (ref 9–20)
BUN: 12 mg/dL (ref 6–20)
Basophils Absolute: 0.1 10*3/uL (ref 0.0–0.2)
Basos: 0 %
Bilirubin Total: 3.3 mg/dL — ABNORMAL HIGH (ref 0.0–1.2)
CO2: 22 mmol/L (ref 20–29)
Calcium: 10 mg/dL (ref 8.7–10.2)
Chloride: 103 mmol/L (ref 96–106)
Creatinine, Ser: 0.91 mg/dL (ref 0.76–1.27)
EOS (ABSOLUTE): 0.3 10*3/uL (ref 0.0–0.4)
Eos: 2 %
Ferritin: 236 ng/mL (ref 30–400)
GFR calc Af Amer: 129 mL/min/{1.73_m2} (ref >59)
GFR calc non Af Amer: 112 mL/min/{1.73_m2} (ref >59)
Globulin, Total: 3.1 g/dL (ref 1.5–4.5)
Glucose: 78 mg/dL (ref 65–99)
Hematocrit: 27.7 % — ABNORMAL LOW (ref 37.5–51.0)
Hemoglobin: 9.6 g/dL — ABNORMAL LOW (ref 13.0–17.7)
Immature Grans (Abs): 0.1 10*3/uL (ref 0.0–0.1)
Immature Granulocytes: 1 %
Lymphocytes Absolute: 3.1 10*3/uL (ref 0.7–3.1)
Lymphs: 23 %
MCH: 34.4 pg — ABNORMAL HIGH (ref 26.6–33.0)
MCHC: 34.7 g/dL (ref 31.5–35.7)
MCV: 99 fL — ABNORMAL HIGH (ref 79–97)
Monocytes Absolute: 1.9 10*3/uL — ABNORMAL HIGH (ref 0.1–0.9)
Monocytes: 14 %
NRBC: 5 % — ABNORMAL HIGH (ref 0–0)
Neutrophils Absolute: 8.3 10*3/uL — ABNORMAL HIGH (ref 1.4–7.0)
Neutrophils: 60 %
Platelets: 387 10*3/uL (ref 150–450)
Potassium: 4.6 mmol/L (ref 3.5–5.2)
RBC: 2.79 x10E6/uL — ABNORMAL LOW (ref 4.14–5.80)
RDW: 23.4 % — ABNORMAL HIGH (ref 11.6–15.4)
Retic Ct Pct: 17.7 % — ABNORMAL HIGH (ref 0.6–2.6)
Sodium: 140 mmol/L (ref 134–144)
Total Protein: 8.1 g/dL (ref 6.0–8.5)
Vit D, 25-Hydroxy: 13.7 ng/mL — ABNORMAL LOW (ref 30.0–100.0)
WBC: 13.7 10*3/uL — ABNORMAL HIGH (ref 3.4–10.8)

## 2020-03-02 LAB — STD SCREEN (8)
HIV Screen 4th Generation wRfx: NONREACTIVE
HSV 1 Glycoprotein G Ab, IgG: 48.7 index — ABNORMAL HIGH (ref 0.00–0.90)
HSV 2 IgG, Type Spec: <0.91 index (ref 0.00–0.90)
Hep A IgM: NEGATIVE
Hep B C IgM: NEGATIVE
Hep C Virus Ab: <0.1 s/co ratio (ref 0.0–0.9)
Hepatitis B Surface Ag: NEGATIVE
RPR Ser Ql: NONREACTIVE

## 2020-03-03 ENCOUNTER — Other Ambulatory Visit: Payer: Self-pay | Admitting: Nurse Practitioner

## 2020-03-03 DIAGNOSIS — Z79891 Long term (current) use of opiate analgesic: Secondary | ICD-10-CM

## 2020-03-03 DIAGNOSIS — D571 Sickle-cell disease without crisis: Secondary | ICD-10-CM

## 2020-03-03 MED ORDER — OXYCODONE HCL 10 MG PO TABS: 10.0000 mg | ORAL_TABLET | ORAL | 0 refills | Status: DC | PRN

## 2020-03-03 NOTE — Telephone Encounter (Signed)
Done

## 2020-03-05 LAB — CT, NG, M GENITALIUM NAA, SWAB
Chlamydia trachomatis, NAA: POSITIVE — AB
Mycoplasma genitalium NAA: NEGATIVE
Neisseria gonorrhoeae, NAA: NEGATIVE

## 2020-03-17 ENCOUNTER — Other Ambulatory Visit: Payer: Self-pay | Admitting: Nurse Practitioner

## 2020-03-17 ENCOUNTER — Telehealth: Payer: Self-pay | Admitting: Nurse Practitioner

## 2020-03-17 DIAGNOSIS — D571 Sickle-cell disease without crisis: Secondary | ICD-10-CM

## 2020-03-17 DIAGNOSIS — Z79891 Long term (current) use of opiate analgesic: Secondary | ICD-10-CM

## 2020-03-17 MED ORDER — OXYCODONE HCL 10 MG PO TABS: 10.0000 mg | ORAL_TABLET | ORAL | 0 refills | Status: DC | PRN

## 2020-03-17 NOTE — Telephone Encounter (Signed)
Pt wants med sent to [facility] on [address]

## 2020-03-17 NOTE — Telephone Encounter (Signed)
Pt called in refill on oxycodone 

## 2020-03-17 NOTE — Telephone Encounter (Signed)
error 

## 2020-04-02 ENCOUNTER — Telehealth: Payer: Self-pay | Admitting: Nurse Practitioner

## 2020-04-02 ENCOUNTER — Other Ambulatory Visit: Payer: Self-pay | Admitting: Nurse Practitioner

## 2020-04-02 DIAGNOSIS — D571 Sickle-cell disease without crisis: Secondary | ICD-10-CM

## 2020-04-02 DIAGNOSIS — Z79891 Long term (current) use of opiate analgesic: Secondary | ICD-10-CM

## 2020-04-02 MED ORDER — OXYCODONE HCL 10 MG PO TABS: 10.0000 mg | ORAL_TABLET | ORAL | 0 refills | Status: DC | PRN

## 2020-04-02 NOTE — Telephone Encounter (Signed)
Sent!

## 2020-04-14 ENCOUNTER — Ambulatory Visit (INDEPENDENT_AMBULATORY_CARE_PROVIDER_SITE_OTHER): Payer: Self-pay | Admitting: Nurse Practitioner

## 2020-04-14 ENCOUNTER — Other Ambulatory Visit: Payer: Self-pay

## 2020-04-14 ENCOUNTER — Encounter: Payer: Self-pay | Admitting: Nurse Practitioner

## 2020-04-14 ENCOUNTER — Other Ambulatory Visit: Payer: Self-pay | Admitting: Nurse Practitioner

## 2020-04-14 VITALS — BP 132/60 | HR 62 | Ht 71.0 in | Wt 176.0 lb

## 2020-04-14 DIAGNOSIS — D571 Sickle-cell disease without crisis: Secondary | ICD-10-CM

## 2020-04-14 DIAGNOSIS — R82998 Other abnormal findings in urine: Secondary | ICD-10-CM

## 2020-04-14 DIAGNOSIS — Z79891 Long term (current) use of opiate analgesic: Secondary | ICD-10-CM

## 2020-04-14 LAB — POCT URINALYSIS DIPSTICK
Bilirubin, UA: NEGATIVE
Glucose, UA: NEGATIVE
Ketones, UA: NEGATIVE
Nitrite, UA: NEGATIVE
Protein, UA: POSITIVE — AB
Spec Grav, UA: 1.02 (ref 1.010–1.025)
Urobilinogen, UA: 0.2 E.U./dL
pH, UA: 5.5 (ref 5.0–8.0)

## 2020-04-14 MED ORDER — OXYCODONE HCL 10 MG PO TABS: 10.0000 mg | ORAL_TABLET | ORAL | 0 refills | Status: DC | PRN

## 2020-04-14 NOTE — Progress Notes (Signed)
[facility] Patient [facility] [address] [place], [place]  [address] Phone:  [phone]   Fax:  [phone]   Established Patient Office Visit  Subjective:  Patient ID: Jerry [name], male    DOB: 05/12/1986  Age: 34 y.o. MRN: [id number]  CC:  Chief Complaint  Patient presents with  . Follow-up    2 month follow up     HPI Mariusz Thornell Mule presents for follow up. He  has a past medical history of Sickle cell anemia (HCC).   He denies fever, headache, cough, wheezing, shortness of breath, chest pains, abdominal pain, back pain, hip pain, or leg pain. Denies any open wounds, skin irritation. Are you actively exercising? Yes Is there intolerance.  He has plans to returning to the gym on June. Last eye exam is unknown. He is uninsured.  He had pain on yesterday and the day before. He feels like it was weather related. He is going to do some bowling and swimming this weekend if the weather permits. He is doing well overall.    Past Medical History:  Diagnosis Date  . Sickle cell anemia (HCC)     History reviewed. No pertinent surgical history.  Family History  Problem Relation Age of Onset  . Sickle cell trait Mother     Social History   Socioeconomic History  . Marital status: Single    Spouse name: Not on file  . Number of children: Not on file  . Years of education: Not on file  . Highest education level: Not on file  Occupational History  . Not on file  Tobacco Use  . Smoking status: Never Smoker  . Smokeless tobacco: Never Used  Substance and Sexual Activity  . Alcohol use: No    Comment: rare  . Drug use: Yes    Types: Marijuana    Comment: occasionally  . Sexual activity: Yes    Birth control/protection: Condom  Other Topics Concern  . Not on file  Social History Narrative  . Not on file   Social Determinants of Health   Financial Resource Strain:   . Difficulty of Paying Living Expenses:   Food Insecurity:   . Worried About  Programme researcher, broadcasting/film/video in the Last Year:   . Barista in the Last Year:   Transportation Needs:   . Freight forwarder (Medical):   Marland Kitchen Lack of Transportation (Non-Medical):   Physical Activity:   . Days of Exercise per Week:   . Minutes of Exercise per Session:   Stress:   . Feeling of Stress :   Social Connections:   . Frequency of Communication with Friends and Family:   . Frequency of Social Gatherings with Friends and Family:   . Attends Religious Services:   . Active Member of Clubs or Organizations:   . Attends Banker Meetings:   Marland Kitchen Marital Status:   Intimate Partner Violence:   . Fear of Current or Ex-Partner:   . Emotionally Abused:   Marland Kitchen Physically Abused:   . Sexually Abused:     Outpatient Medications Prior to Visit  Medication Sig Dispense Refill  . lisinopril (ZESTRIL) 2.5 MG tablet Take 1 tablet (2.5 mg total) by mouth daily. 30 tablet 6  . folic acid (FOLVITE) 1 MG tablet Take 1 tablet (1 mg total) by mouth daily. (Patient not taking: Reported on 04/14/2020) 30 tablet 6  . hydroxyurea (HYDREA) 500 MG capsule Take 3 capsules (1,500  mg total) by mouth daily. May take with food to minimize GI side effects. (Patient not taking: Reported on 04/14/2020) 90 capsule 6  . ibuprofen (ADVIL) 800 MG tablet Take 1 tablet, 3 times a day as needed. (Patient not taking: Reported on 04/14/2020) 30 tablet 6  . Vitamin D, Ergocalciferol, (DRISDOL) 1.25 MG (50000 UT) CAPS capsule Take 1 capsule (50,000 Units total) by mouth every 7 (seven) days. (Patient not taking: Reported on 12/17/2019) 30 capsule 1  . Oxycodone HCl 10 MG TABS Take 1 tablet (10 mg total) by mouth every 4 (four) hours as needed for up to 15 days. (Patient not taking: Reported on 04/14/2020) 90 tablet 0   No facility-administered medications prior to visit.    No Known Allergies  ROS Review of Systems  All other systems reviewed and are negative.     Objective:    Physical Exam  Constitutional:  He is oriented to person, place, and time. He appears well-developed and well-nourished.  HENT:  Head: Normocephalic.  Eyes: Conjunctivae and EOM are normal.  Cardiovascular: Normal rate, regular rhythm, normal heart sounds and intact distal pulses.  Pulmonary/Chest: Effort normal and breath sounds normal.  Abdominal: Soft. Bowel sounds are normal.  Musculoskeletal:        General: Normal range of motion.     Cervical back: Normal range of motion.  Neurological: He is alert and oriented to person, place, and time.  Skin: Skin is warm and dry.  Psychiatric: He has a normal mood and affect. His behavior is normal. Judgment and thought content normal.    BP 132/60 (BP Location: Left Arm, Patient Position: Sitting)   Pulse 62   Ht 5\' 11"  (1.803 m)   Wt 176 lb (79.8 kg)   SpO2 94%   BMI 24.55 kg/m  Wt Readings from Last 3 Encounters:  04/14/20 176 lb (79.8 kg)  03/01/20 174 lb (78.9 kg)  02/12/20 176 lb 9.6 oz (80.1 kg)     There are no preventive care reminders to display for this patient.  There are no preventive care reminders to display for this patient.  Lab Results  Component Value Date   TSH 0.809 07/04/2019   Lab Results  Component Value Date   WBC 13.7 (H) 03/01/2020   HGB 9.6 (L) 03/01/2020   HCT 27.7 (L) 03/01/2020   MCV 99 (H) 03/01/2020   PLT 387 03/01/2020   Lab Results  Component Value Date   NA 140 03/01/2020   K 4.6 03/01/2020   CO2 22 03/01/2020   GLUCOSE 78 03/01/2020   BUN 12 03/01/2020   CREATININE 0.91 03/01/2020   BILITOT 3.3 (H) 03/01/2020   ALKPHOS 86 03/01/2020   AST 53 (H) 03/01/2020   ALT 18 03/01/2020   PROT 8.1 03/01/2020   ALBUMIN 5.0 03/01/2020   CALCIUM 10.0 03/01/2020   ANIONGAP 7 11/06/2019   Lab Results  Component Value Date   CHOL 130 01/09/2017   Lab Results  Component Value Date   HDL 46 01/09/2017   Lab Results  Component Value Date   LDLCALC 68 01/09/2017   Lab Results  Component Value Date   TRIG 81  01/09/2017   Lab Results  Component Value Date   CHOLHDL 2.8 01/09/2017   No results found for: HGBA1C    Assessment & Plan:   Problem List Items Addressed This Visit      High   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 Metabolic Panel (12)   Ferritin   Reticulocytes     Medium   Chronic prescription opiate use   Relevant Medications   Oxycodone HCl 10 MG TABS (Start on 04/17/2020)      Meds ordered this encounter  Medications  . Oxycodone HCl 10 MG TABS    Sig: Take 1 tablet (10 mg total) by mouth every 4 (four) hours as needed for up to 15 days.    Dispense:  90 tablet    Refill:  0    May fill on 04/17/20    Order Specific Question:   Supervising Provider    Answer:   Tresa Garter [[id number]]    Follow-up: Return in about 2 months (around 06/14/2020).    Vevelyn Francois, NP

## 2020-04-15 LAB — FERRITIN: Ferritin: 172 ng/mL (ref 30–400)

## 2020-04-15 LAB — CBC WITH DIFFERENTIAL/PLATELET
Basophils Absolute: 0.1 10*3/uL (ref 0.0–0.2)
Basos: 0 %
EOS (ABSOLUTE): 0.3 10*3/uL (ref 0.0–0.4)
Eos: 3 %
Hematocrit: 26 % — ABNORMAL LOW (ref 37.5–51.0)
Hemoglobin: 8.9 g/dL — ABNORMAL LOW (ref 13.0–17.7)
Immature Grans (Abs): 0.1 10*3/uL (ref 0.0–0.1)
Immature Granulocytes: 1 %
Lymphocytes Absolute: 2.8 10*3/uL (ref 0.7–3.1)
Lymphs: 23 %
MCH: 34.5 pg — ABNORMAL HIGH (ref 26.6–33.0)
MCHC: 34.2 g/dL (ref 31.5–35.7)
MCV: 101 fL — ABNORMAL HIGH (ref 79–97)
Monocytes Absolute: 1.9 10*3/uL — ABNORMAL HIGH (ref 0.1–0.9)
Monocytes: 15 %
NRBC: 3 % — ABNORMAL HIGH (ref 0–0)
Neutrophils Absolute: 7.2 10*3/uL — ABNORMAL HIGH (ref 1.4–7.0)
Neutrophils: 58 %
Platelets: 376 10*3/uL (ref 150–450)
RBC: 2.58 x10E6/uL — CL (ref 4.14–5.80)
RDW: 23.2 % — ABNORMAL HIGH (ref 11.6–15.4)
WBC: 12.4 10*3/uL — ABNORMAL HIGH (ref 3.4–10.8)

## 2020-04-15 LAB — COMP. METABOLIC PANEL (12)
AST: 43 IU/L — ABNORMAL HIGH (ref 0–40)
Albumin/Globulin Ratio: 1.7 (ref 1.2–2.2)
Albumin: 4.7 g/dL (ref 4.0–5.0)
Alkaline Phosphatase: 86 IU/L (ref 48–121)
BUN/Creatinine Ratio: 12 (ref 9–20)
BUN: 11 mg/dL (ref 6–20)
Bilirubin Total: 3.2 mg/dL — ABNORMAL HIGH (ref 0.0–1.2)
Calcium: 9.9 mg/dL (ref 8.7–10.2)
Chloride: 104 mmol/L (ref 96–106)
Creatinine, Ser: 0.91 mg/dL (ref 0.76–1.27)
GFR calc Af Amer: 129 mL/min/{1.73_m2} (ref >59)
GFR calc non Af Amer: 112 mL/min/{1.73_m2} (ref >59)
Globulin, Total: 2.7 g/dL (ref 1.5–4.5)
Glucose: 61 mg/dL — ABNORMAL LOW (ref 65–99)
Potassium: 4.7 mmol/L (ref 3.5–5.2)
Sodium: 138 mmol/L (ref 134–144)
Total Protein: 7.4 g/dL (ref 6.0–8.5)

## 2020-04-15 LAB — RETICULOCYTES: Retic Ct Pct: 16.8 % — ABNORMAL HIGH (ref 0.6–2.6)

## 2020-04-21 ENCOUNTER — Encounter: Payer: Self-pay | Admitting: Nurse Practitioner

## 2020-04-21 DIAGNOSIS — R809 Proteinuria, unspecified: Secondary | ICD-10-CM | POA: Insufficient documentation

## 2020-05-03 ENCOUNTER — Telehealth: Payer: Self-pay | Admitting: Nurse Practitioner

## 2020-05-03 ENCOUNTER — Other Ambulatory Visit: Payer: Self-pay | Admitting: Nurse Practitioner

## 2020-05-03 DIAGNOSIS — Z79891 Long term (current) use of opiate analgesic: Secondary | ICD-10-CM

## 2020-05-03 DIAGNOSIS — D571 Sickle-cell disease without crisis: Secondary | ICD-10-CM

## 2020-05-03 DIAGNOSIS — R801 Persistent proteinuria, unspecified: Secondary | ICD-10-CM

## 2020-05-03 MED ORDER — LISINOPRIL 2.5 MG PO TABS: 2.5000 mg | ORAL_TABLET | Freq: Every day | ORAL | 6 refills | Status: DC

## 2020-05-03 MED ORDER — OXYCODONE HCL 10 MG PO TABS: 10.0000 mg | ORAL_TABLET | ORAL | 0 refills | Status: DC | PRN

## 2020-05-03 NOTE — Telephone Encounter (Signed)
Pt requested med refill for... Oxycodone - [facility] [address] (Pharmacy) Lisinipril- [facility]

## 2020-05-03 NOTE — Telephone Encounter (Signed)
Sent!

## 2020-05-03 NOTE — Progress Notes (Unsigned)
   [facility] [address] , [place]  [address] Phone:  [phone]   Fax:  [phone] 

## 2020-05-19 ENCOUNTER — Other Ambulatory Visit: Payer: Self-pay | Admitting: Nurse Practitioner

## 2020-05-19 ENCOUNTER — Telehealth: Payer: Self-pay | Admitting: Nurse Practitioner

## 2020-05-19 DIAGNOSIS — D571 Sickle-cell disease without crisis: Secondary | ICD-10-CM

## 2020-05-19 DIAGNOSIS — Z79891 Long term (current) use of opiate analgesic: Secondary | ICD-10-CM

## 2020-05-19 MED ORDER — OXYCODONE HCL 10 MG PO TABS: 10.0000 mg | ORAL_TABLET | ORAL | 0 refills | Status: DC | PRN

## 2020-05-19 NOTE — Telephone Encounter (Signed)
I don't see a referral place for him , I will place  referral for and get it sent over.

## 2020-05-19 NOTE — Telephone Encounter (Signed)
Refill sent  Can you look into the referral to nephrology  Thanks

## 2020-06-02 ENCOUNTER — Other Ambulatory Visit: Payer: Self-pay | Admitting: Nurse Practitioner

## 2020-06-02 ENCOUNTER — Telehealth: Payer: Self-pay | Admitting: Nurse Practitioner

## 2020-06-02 ENCOUNTER — Telehealth: Payer: Self-pay

## 2020-06-02 DIAGNOSIS — Z79891 Long term (current) use of opiate analgesic: Secondary | ICD-10-CM

## 2020-06-02 DIAGNOSIS — D571 Sickle-cell disease without crisis: Secondary | ICD-10-CM

## 2020-06-02 MED ORDER — OXYCODONE HCL 10 MG PO TABS: 10.0000 mg | ORAL_TABLET | ORAL | 0 refills | Status: DC | PRN

## 2020-06-02 NOTE — Telephone Encounter (Signed)
Now it is done  Thanks

## 2020-06-02 NOTE — Telephone Encounter (Signed)
Done

## 2020-06-02 NOTE — Telephone Encounter (Signed)
Error

## 2020-06-14 ENCOUNTER — Ambulatory Visit (INDEPENDENT_AMBULATORY_CARE_PROVIDER_SITE_OTHER): Payer: Self-pay | Admitting: Nurse Practitioner

## 2020-06-14 ENCOUNTER — Encounter: Payer: Self-pay | Admitting: Nurse Practitioner

## 2020-06-14 ENCOUNTER — Other Ambulatory Visit: Payer: Self-pay

## 2020-06-14 DIAGNOSIS — D571 Sickle-cell disease without crisis: Secondary | ICD-10-CM

## 2020-06-14 DIAGNOSIS — Z79891 Long term (current) use of opiate analgesic: Secondary | ICD-10-CM

## 2020-06-14 DIAGNOSIS — R801 Persistent proteinuria, unspecified: Secondary | ICD-10-CM

## 2020-06-14 DIAGNOSIS — F329 Major depressive disorder, single episode, unspecified: Secondary | ICD-10-CM

## 2020-06-14 LAB — POCT URINALYSIS DIPSTICK
Bilirubin, UA: NEGATIVE
Glucose, UA: NEGATIVE
Ketones, UA: NEGATIVE
Leukocytes, UA: NEGATIVE
Nitrite, UA: NEGATIVE
Protein, UA: POSITIVE — AB
Spec Grav, UA: 1.02 (ref 1.010–1.025)
Urobilinogen, UA: 1 E.U./dL
pH, UA: 6 (ref 5.0–8.0)

## 2020-06-14 MED ORDER — OXYCODONE HCL 10 MG PO TABS: 10.0000 mg | ORAL_TABLET | ORAL | 0 refills | Status: DC | PRN

## 2020-06-14 NOTE — Patient Instructions (Signed)
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
## 2020-06-15 LAB — CMP14+CBC/D/PLT+FER+RETIC+V...
ALT: 13 IU/L (ref 0–44)
AST: 36 IU/L (ref 0–40)
Albumin/Globulin Ratio: 1.5 (ref 1.2–2.2)
Albumin: 4.6 g/dL (ref 4.0–5.0)
Alkaline Phosphatase: 86 IU/L (ref 48–121)
BUN/Creatinine Ratio: 10 (ref 9–20)
BUN: 9 mg/dL (ref 6–20)
Basophils Absolute: 0 10*3/uL (ref 0.0–0.2)
Basos: 0 %
Bilirubin Total: 3.1 mg/dL — ABNORMAL HIGH (ref 0.0–1.2)
CO2: 21 mmol/L (ref 20–29)
Calcium: 9.7 mg/dL (ref 8.7–10.2)
Chloride: 104 mmol/L (ref 96–106)
Creatinine, Ser: 0.89 mg/dL (ref 0.76–1.27)
EOS (ABSOLUTE): 0.5 10*3/uL — ABNORMAL HIGH (ref 0.0–0.4)
Eos: 4 %
Ferritin: 121 ng/mL (ref 30–400)
GFR calc Af Amer: 132 mL/min/{1.73_m2} (ref >59)
GFR calc non Af Amer: 114 mL/min/{1.73_m2} (ref >59)
Globulin, Total: 3.1 g/dL (ref 1.5–4.5)
Glucose: 89 mg/dL (ref 65–99)
Hematocrit: 27.1 % — ABNORMAL LOW (ref 37.5–51.0)
Hemoglobin: 9 g/dL — ABNORMAL LOW (ref 13.0–17.7)
Immature Grans (Abs): 0.1 10*3/uL (ref 0.0–0.1)
Immature Granulocytes: 1 %
Lymphocytes Absolute: 3.5 10*3/uL — ABNORMAL HIGH (ref 0.7–3.1)
Lymphs: 30 %
MCH: 33.1 pg — ABNORMAL HIGH (ref 26.6–33.0)
MCHC: 33.2 g/dL (ref 31.5–35.7)
MCV: 100 fL — ABNORMAL HIGH (ref 79–97)
Monocytes Absolute: 1.6 10*3/uL — ABNORMAL HIGH (ref 0.1–0.9)
Monocytes: 14 %
NRBC: 2 % — ABNORMAL HIGH (ref 0–0)
Neutrophils Absolute: 6 10*3/uL (ref 1.4–7.0)
Neutrophils: 51 %
Platelets: 359 10*3/uL (ref 150–450)
Potassium: 4.5 mmol/L (ref 3.5–5.2)
RBC: 2.72 x10E6/uL — CL (ref 4.14–5.80)
RDW: 23 % — ABNORMAL HIGH (ref 11.6–15.4)
Retic Ct Pct: 16.1 % — ABNORMAL HIGH (ref 0.6–2.6)
Sodium: 140 mmol/L (ref 134–144)
Total Protein: 7.7 g/dL (ref 6.0–8.5)
Vit D, 25-Hydroxy: 14.2 ng/mL — ABNORMAL LOW (ref 30.0–100.0)
WBC: 11.7 10*3/uL — ABNORMAL HIGH (ref 3.4–10.8)

## 2020-06-15 LAB — MICROALBUMIN / CREATININE URINE RATIO
Creatinine, Urine: 120.2 mg/dL
Microalb/Creat Ratio: 256 mg/g creat — ABNORMAL HIGH (ref 0–29)
Microalbumin, Urine: 307.7 ug/mL

## 2020-06-17 ENCOUNTER — Encounter: Payer: Self-pay | Admitting: Nurse Practitioner

## 2020-06-17 DIAGNOSIS — R809 Proteinuria, unspecified: Secondary | ICD-10-CM | POA: Insufficient documentation

## 2020-06-18 ENCOUNTER — Other Ambulatory Visit: Payer: Self-pay | Admitting: Nurse Practitioner

## 2020-06-18 ENCOUNTER — Telehealth: Payer: Self-pay | Admitting: Nurse Practitioner

## 2020-06-18 DIAGNOSIS — Z79891 Long term (current) use of opiate analgesic: Secondary | ICD-10-CM

## 2020-06-18 DIAGNOSIS — D571 Sickle-cell disease without crisis: Secondary | ICD-10-CM

## 2020-06-18 MED ORDER — OXYCODONE HCL 10 MG PO TABS: 10.0000 mg | ORAL_TABLET | ORAL | 0 refills | Status: DC | PRN

## 2020-06-18 NOTE — Telephone Encounter (Signed)
Ok so the information was incorrect as it relates to the Pharmacy. However it has been resent

## 2020-07-05 ENCOUNTER — Telehealth: Payer: Self-pay | Admitting: Nurse Practitioner

## 2020-07-05 ENCOUNTER — Other Ambulatory Visit: Payer: Self-pay | Admitting: Family Medicine

## 2020-07-05 ENCOUNTER — Telehealth (HOSPITAL_COMMUNITY): Payer: Self-pay | Admitting: General Practice

## 2020-07-05 ENCOUNTER — Encounter (HOSPITAL_COMMUNITY): Payer: Self-pay | Admitting: Internal Medicine

## 2020-07-05 ENCOUNTER — Non-Acute Institutional Stay (HOSPITAL_COMMUNITY)
Admission: AD | Admit: 2020-07-05 | Discharge: 2020-07-05 | Disposition: A | Discharge status: Home / Self Care | Payer: Medicaid Other | Source: Ambulatory Visit | Attending: Internal Medicine | Admitting: Internal Medicine

## 2020-07-05 DIAGNOSIS — D638 Anemia in other chronic diseases classified elsewhere: Secondary | ICD-10-CM | POA: Diagnosis not present

## 2020-07-05 DIAGNOSIS — D5700 Hb-SS disease with crisis, unspecified: Secondary | ICD-10-CM | POA: Diagnosis not present

## 2020-07-05 DIAGNOSIS — Z79899 Other long term (current) drug therapy: Secondary | ICD-10-CM | POA: Insufficient documentation

## 2020-07-05 DIAGNOSIS — G894 Chronic pain syndrome: Secondary | ICD-10-CM | POA: Diagnosis not present

## 2020-07-05 DIAGNOSIS — D571 Sickle-cell disease without crisis: Secondary | ICD-10-CM

## 2020-07-05 DIAGNOSIS — F1120 Opioid dependence, uncomplicated: Secondary | ICD-10-CM | POA: Insufficient documentation

## 2020-07-05 DIAGNOSIS — Z79891 Long term (current) use of opiate analgesic: Secondary | ICD-10-CM

## 2020-07-05 DIAGNOSIS — R52 Pain, unspecified: Secondary | ICD-10-CM | POA: Diagnosis present

## 2020-07-05 LAB — COMPREHENSIVE METABOLIC PANEL
ALT: 26 U/L (ref 0–44)
AST: 67 U/L — ABNORMAL HIGH (ref 15–41)
Albumin: 4.9 g/dL (ref 3.5–5.0)
Alkaline Phosphatase: 67 U/L (ref 38–126)
Anion gap: 9 (ref 5–15)
BUN: 16 mg/dL (ref 6–20)
CO2: 23 mmol/L (ref 22–32)
Calcium: 9.1 mg/dL (ref 8.9–10.3)
Chloride: 108 mmol/L (ref 98–111)
Creatinine, Ser: 0.89 mg/dL (ref 0.61–1.24)
GFR calc Af Amer: >60 mL/min (ref >60)
GFR calc non Af Amer: >60 mL/min (ref >60)
Glucose, Bld: 77 mg/dL (ref 70–99)
Potassium: 4.6 mmol/L (ref 3.5–5.1)
Sodium: 140 mmol/L (ref 135–145)
Total Bilirubin: 2.3 mg/dL — ABNORMAL HIGH (ref 0.3–1.2)
Total Protein: 8 g/dL (ref 6.5–8.1)

## 2020-07-05 LAB — CBC WITH DIFFERENTIAL/PLATELET
Abs Immature Granulocytes: 0.56 10*3/uL — ABNORMAL HIGH (ref 0.00–0.07)
Basophils Absolute: 0.1 10*3/uL (ref 0.0–0.1)
Basophils Relative: 1 %
Eosinophils Absolute: 0.3 10*3/uL (ref 0.0–0.5)
Eosinophils Relative: 2 %
HCT: 23.3 % — ABNORMAL LOW (ref 39.0–52.0)
Hemoglobin: 8.2 g/dL — ABNORMAL LOW (ref 13.0–17.0)
Immature Granulocytes: 3 %
Lymphocytes Relative: 13 %
Lymphs Abs: 2.7 10*3/uL (ref 0.7–4.0)
MCH: 33.5 pg (ref 26.0–34.0)
MCHC: 35.2 g/dL (ref 30.0–36.0)
MCV: 95.1 fL (ref 80.0–100.0)
Monocytes Absolute: 2.9 10*3/uL — ABNORMAL HIGH (ref 0.1–1.0)
Monocytes Relative: 14 %
Neutro Abs: 13.5 10*3/uL — ABNORMAL HIGH (ref 1.7–7.7)
Neutrophils Relative %: 67 %
Platelets: 286 10*3/uL (ref 150–400)
RBC: 2.45 MIL/uL — ABNORMAL LOW (ref 4.22–5.81)
RDW: 28.9 % — ABNORMAL HIGH (ref 11.5–15.5)
WBC: 20 10*3/uL — ABNORMAL HIGH (ref 4.0–10.5)
nRBC: 6.4 % — ABNORMAL HIGH (ref 0.0–0.2)

## 2020-07-05 LAB — RETICULOCYTES
Immature Retic Fract: 37.4 % — ABNORMAL HIGH (ref 2.3–15.9)
RBC.: 2.41 MIL/uL — ABNORMAL LOW (ref 4.22–5.81)
Retic Count, Absolute: 539.5 10*3/uL — ABNORMAL HIGH (ref 19.0–186.0)
Retic Ct Pct: 23 % — ABNORMAL HIGH (ref 0.4–3.1)

## 2020-07-05 MED ORDER — OXYCODONE HCL 10 MG PO TABS: 10.0000 mg | ORAL_TABLET | ORAL | 0 refills | Status: DC | PRN

## 2020-07-05 MED ORDER — ACETAMINOPHEN 500 MG PO TABS
1000.0000 mg | ORAL_TABLET | Freq: Once | ORAL | Status: AC
  Administered 2020-07-05: 1000 mg via ORAL
  Filled 2020-07-05: qty 2

## 2020-07-05 MED ORDER — ONDANSETRON HCL 4 MG/2ML IJ SOLN
4.0000 mg | Freq: Four times a day (QID) | INTRAMUSCULAR | Status: DC | PRN
  Administered 2020-07-05: 4 mg via INTRAVENOUS
  Filled 2020-07-05: qty 2

## 2020-07-05 MED ORDER — HYDROMORPHONE 1 MG/ML IV SOLN
INTRAVENOUS | Status: DC
  Administered 2020-07-05: 30 mg via INTRAVENOUS
  Administered 2020-07-05: 8.5 mg via INTRAVENOUS
  Filled 2020-07-05: qty 30

## 2020-07-05 MED ORDER — SODIUM CHLORIDE 0.45 % IV SOLN: INTRAVENOUS | Status: DC

## 2020-07-05 MED ORDER — SODIUM CHLORIDE 0.9% FLUSH: 9.0000 mL | INTRAVENOUS | Status: DC | PRN

## 2020-07-05 MED ORDER — NALOXONE HCL 0.4 MG/ML IJ SOLN: 0.4000 mg | INTRAMUSCULAR | Status: DC | PRN

## 2020-07-05 MED ORDER — DIPHENHYDRAMINE HCL 25 MG PO CAPS
25.0000 mg | ORAL_CAPSULE | ORAL | Status: DC | PRN
  Administered 2020-07-05: 25 mg via ORAL
  Filled 2020-07-05: qty 1

## 2020-07-05 NOTE — Telephone Encounter (Signed)
Patient called in stating that he is in sickle cell crisis. He states that he has pain 9/10 in his ribs, shoulders, and back. He states he took a oxycodone 10mg  at 0600. He denies a fever, chest pain, N/V, diarrhea, and abdominal pain. He says he has a friend with a car that will pick him up upon discharge. Provider notified and agrees that he can come in.

## 2020-07-05 NOTE — Progress Notes (Signed)
21 ml of dilaudid PCA wasted in the sharps container as witnessed by Gweneth Fritter, RN.

## 2020-07-05 NOTE — Progress Notes (Signed)
Patient admitted to the day hospital for treatment of sickle cell pain crisis. Patient reported pain rated 9/10 in the back, shoulders. Patient placed on Dilaudid PCA, given PO tylenol, IV toradol and hydrated with IV fluids. At discharge patient reported  pain at 5/10. Discharge instructions given to patient. Patient alert, oriented and ambulatory at discharge.

## 2020-07-05 NOTE — Discharge Summary (Signed)
[facility] Discharge Summary   Patient ID: Jerry [name] MRN: [id number] DOB/AGE: [birth year] 34 y.o.  Admit date: 07/05/2020 Discharge date: 07/05/2020  Primary Care Physician:  Barbette Merino, NP  Admission Diagnoses:  Principal Problem:   Sickle cell pain crisis [facility])   Discharge Medications:  Allergies as of 07/05/2020   No Known Allergies     Medication List    TAKE these medications   folic acid 1 MG tablet Commonly known as: FOLVITE Take 1 tablet (1 mg total) by mouth daily.   hydroxyurea 500 MG capsule Commonly known as: HYDREA Take 3 capsules (1,500 mg total) by mouth daily. May take with food to minimize GI side effects.   ibuprofen 800 MG tablet Commonly known as: ADVIL Take 1 tablet, 3 times a day as needed.   lisinopril 2.5 MG tablet Commonly known as: ZESTRIL Take 1 tablet (2.5 mg total) by mouth daily.   Vitamin D (Ergocalciferol) 1.25 MG (50000 UNIT) Caps capsule Commonly known as: DRISDOL Take 1 capsule (50,000 Units total) by mouth every 7 (seven) days.        Consults:  None  Significant Diagnostic Studies:  No results found.  History of present illness: Jerry [name] is a 34 year old male with a medical history significant for sickle cell disease, chronic pain syndrome, opiate dependence and tolerance, and history of anemia of chronic disease presents complaining of generalized pain that is consistent with his typical pain crisis.  Patient says that pain intensity has been increased over the past 2 days and has been unrelieved by home medications.  He last had oxycodone this a.m. without sustained relief.  He attributes current pain crisis to working increased hours at his job.  He says that he has been working 7 days straight without a day off.  He says he works a very physical job.  He rates pain as 10/10 characterized as constant, throbbing, and occasionally sharp.  He denies any fever, chills, chest pain, shortness of  breath, urinary symptoms, vomiting, or diarrhea.  Patient endorses some nausea.  He denies any sick contacts, recent travel, or exposure to COVID-19.  [facility] Course: Patient was admitted to sickle cell day infusion center for management of pain crisis.  Reviewed all laboratory values.  WBCs elevated at 20 K, considered to be secondary to sickle cell crisis.  Patient is afebrile without any signs of infection or inflammation.  No antibiotics warranted at this time. Hemoglobin is 8.2, consistent with patient's baseline of 8-9 g/dL. Pain managed with IV Dilaudid via PCA with settings of 0.5 mg, 10-minute lockout, and 3 mg/h. Toradol 15 mg IV x1 Tylenol 1000 mg x 1 IV fluids, 0.45% saline at 150 mL/h Pain intensity decreased to 4/10.  Patient does not warrant admission at this time.  Advised to request pain medications from PCP.  Also, follow-up with PCP for medication management as scheduled. Patient is alert, oriented, and ambulating without assistance. Patient will discharge home in a hemodynamically stable condition.  Discharge instructions: Resume all home medications.   Follow up with PCP as previously  scheduled.   Discussed the importance of drinking 64 ounces of water daily, dehydration of red blood cells may lead further sickling.   Avoid all stressors that precipitate sickle cell pain crisis.     The patient was given clear instructions to go to ER or return to medical center if symptoms do not improve, worsen or new problems develop.    Physical Exam  at Discharge:  BP (!) 137/98 (BP Location: Right Arm)   Pulse 65   Temp 98.7 F (37.1 C) (Oral)   Resp 16   SpO2 98%  Physical Exam Constitutional:      Appearance: Normal appearance.  Eyes:     Pupils: Pupils are equal, round, and reactive to light.  Cardiovascular:     Rate and Rhythm: Normal rate and regular rhythm.     Pulses: Normal pulses.  Pulmonary:     Effort: Pulmonary effort is normal.   Abdominal:     General: Abdomen is flat.  Neurological:     General: No focal deficit present.     Mental Status: He is alert. Mental status is at baseline.  Psychiatric:        Mood and Affect: Mood normal.        Behavior: Behavior normal.        Thought Content: Thought content normal.        Judgment: Judgment normal.      Disposition at Discharge: Discharge disposition: 01-Home or Self Care       Discharge Orders:   Condition at Discharge:   Stable  Time spent on Discharge:  Greater than 30 minutes.  Signed: Nolon Nations  APRN, MSN, FNP-C Patient Care [facility] [address] [place], [place] [address] [phone]  07/05/2020, 8:11 PM

## 2020-07-05 NOTE — Discharge Instructions (Addendum)
Sickle Cell Anemia, Adult  Sickle cell anemia is a condition where your red blood cells are shaped like sickles. Red blood cells carry oxygen through the body. Sickle-shaped cells do not live as long as normal red blood cells. They also clump together and block blood from flowing through the blood vessels. This prevents the body from getting enough oxygen. Sickle cell anemia causes organ damage and pain. It also increases the risk of infection. Follow these instructions at home: Medicines  Take over-the-counter and prescription medicines only as told by your doctor.  If you were prescribed an antibiotic medicine, take it as told by your doctor. Do not stop taking the antibiotic even if you start to feel better.  If you develop a fever, do not take medicines to lower the fever right away. Tell your doctor about the fever. Managing pain, stiffness, and swelling  Try these methods to help with pain: ? Use a heating pad. ? Take a warm bath. ? Distract yourself, such as by watching TV. Eating and drinking  Drink enough fluid to keep your pee (urine) clear or pale yellow. Drink more in hot weather and during exercise.  Limit or avoid alcohol.  Eat a healthy diet. Eat plenty of fruits, vegetables, whole grains, and lean protein.  Take vitamins and supplements as told by your doctor. Traveling  When traveling, keep these with you: ? Your medical information. ? The names of your doctors. ? Your medicines.  If you need to take an airplane, talk to your doctor first. Activity  Rest often.  Avoid exercises that make your heart beat much faster, such as jogging. General instructions  Do not use products that have nicotine or tobacco, such as cigarettes and e-cigarettes. If you need help quitting, ask your doctor.  Consider wearing a medical alert bracelet.  Avoid being in high places (high altitudes), such as mountains.  Avoid very hot or cold temperatures.  Avoid places where the  temperature changes a lot.  Keep all follow-up visits as told by your doctor. This is important. Contact a doctor if:  A joint hurts.  Your feet or hands hurt or swell.  You feel tired (fatigued). Get help right away if:  You have symptoms of infection. These include: ? Fever. ? Chills. ? Being very tired. ? Irritability. ? Poor eating. ? Throwing up (vomiting).  You feel dizzy or faint.  You have new stomach pain, especially on the left side.  You have a an erection (priapism) that lasts more than 4 hours.  You have numbness in your arms or legs.  You have a hard time moving your arms or legs.  You have trouble talking.  You have pain that does not go away when you take medicine.  You are short of breath.  You are breathing fast.  You have a long-term cough.  You have pain in your chest.  You have a bad headache.  You have a stiff neck.  Your stomach looks bloated even though you did not eat much.  Your skin is pale.  You suddenly cannot see well. Summary  Sickle cell anemia is a condition where your red blood cells are shaped like sickles.  Follow your doctor's advice on ways to manage pain, food to eat, activities to do, and steps to take for safe travel.  Get medical help right away if you have any signs of infection, such as a fever. This information is not intended to replace advice given to you by   your health care provider. Make sure you discuss any questions you have with your health care provider. Document Revised: 03/07/2019 Document Reviewed: 12/19/2016 Elsevier Patient Education  2020 Elsevier Inc.  

## 2020-07-05 NOTE — H&P (Signed)
[facility] History and Physical   Date: 07/05/2020  Patient name: Jerry [name] Medical record number: [id number] Date of birth: 1986/10/24 Age: 34 y.o. Gender: male PCP: Barbette Merino, NP  Attending physician: Quentin Angst, MD  Chief Complaint: Sickle cell pain  History of Present Illness: Jaquon Gingerich is a 34 year old male with a medical history significant for sickle cell disease, chronic pain syndrome, opiate dependence and tolerance, and history of anemia of chronic disease presents complaining of generalized pain that is consistent with his typical pain crisis.  Patient says that pain intensity has been increased over the past 2 days and has been unrelieved by home medications.  He last had oxycodone this a.m. without sustained relief.  He attributes current pain crisis to working increased hours at his job.  He says that he has been working 7 days straight without a day off.  He says he works a very physical job.  He rates pain as 10/10 characterized as constant, throbbing, and occasionally sharp.  He denies any fever, chills, chest pain, shortness of breath, urinary symptoms, vomiting, or diarrhea.  Patient endorses some nausea.  He denies any sick contacts, recent travel, or exposure to COVID-19.   Meds: Medications Prior to Admission  Medication Sig Dispense Refill Last Dose  . folic acid (FOLVITE) 1 MG tablet Take 1 tablet (1 mg total) by mouth daily. (Patient not taking: Reported on 04/14/2020) 30 tablet 6   . hydroxyurea (HYDREA) 500 MG capsule Take 3 capsules (1,500 mg total) by mouth daily. May take with food to minimize GI side effects. (Patient not taking: Reported on 04/14/2020) 90 capsule 6   . ibuprofen (ADVIL) 800 MG tablet Take 1 tablet, 3 times a day as needed. (Patient not taking: Reported on 04/14/2020) 30 tablet 6   . lisinopril (ZESTRIL) 2.5 MG tablet Take 1 tablet (2.5 mg total) by mouth daily. 30 tablet 6   . Vitamin D,  Ergocalciferol, (DRISDOL) 1.25 MG (50000 UT) CAPS capsule Take 1 capsule (50,000 Units total) by mouth every 7 (seven) days. (Patient not taking: Reported on 12/17/2019) 30 capsule 1     Allergies: Patient has no known allergies. Past Medical History:  Diagnosis Date  . Sickle cell anemia (HCC)    No past surgical history on file. Family History  Problem Relation Age of Onset  . Sickle cell trait Mother    Social History   Socioeconomic History  . Marital status: Single    Spouse name: Not on file  . Number of children: Not on file  . Years of education: Not on file  . Highest education level: Not on file  Occupational History  . Not on file  Tobacco Use  . Smoking status: Never Smoker  . Smokeless tobacco: Never Used  Vaping Use  . Vaping Use: Never used  Substance and Sexual Activity  . Alcohol use: No    Comment: rare  . Drug use: Yes    Types: Marijuana    Comment: occasionally  . Sexual activity: Yes    Birth control/protection: Condom  Other Topics Concern  . Not on file  Social History Narrative  . Not on file   Social Determinants of Health   Financial Resource Strain:   . Difficulty of Paying Living Expenses:   Food Insecurity:   . Worried About Programme researcher, broadcasting/film/video in the Last Year:   . Barista in the Last Year:   Transportation Needs:   .  Lack of Transportation (Medical):   Marland Kitchen Lack of Transportation (Non-Medical):   Physical Activity:   . Days of Exercise per Week:   . Minutes of Exercise per Session:   Stress:   . Feeling of Stress :   Social Connections:   . Frequency of Communication with Friends and Family:   . Frequency of Social Gatherings with Friends and Family:   . Attends Religious Services:   . Active Member of Clubs or Organizations:   . Attends Banker Meetings:   Marland Kitchen Marital Status:   Intimate Partner Violence:   . Fear of Current or Ex-Partner:   . Emotionally Abused:   Marland Kitchen Physically Abused:   . Sexually  Abused:    Review of Systems  Constitutional: Negative for chills and fever.  HENT: Negative.   Eyes: Negative.   Respiratory: Negative.   Cardiovascular: Negative.   Gastrointestinal: Negative.   Genitourinary: Negative.   Musculoskeletal: Positive for back pain and joint pain.  Neurological: Negative.   Psychiatric/Behavioral: Negative.     Physical Exam: Blood pressure (!) 141/80, pulse 88, temperature 98.7 F (37.1 C), temperature source Oral, resp. rate 16, SpO2 96 %. Physical Exam Constitutional:      Appearance: Normal appearance.  HENT:     Head: Normocephalic.     Mouth/Throat:     Mouth: Mucous membranes are moist.  Eyes:     Pupils: Pupils are equal, round, and reactive to light.  Cardiovascular:     Rate and Rhythm: Normal rate and regular rhythm.     Pulses: Normal pulses.  Pulmonary:     Effort: Pulmonary effort is normal.     Breath sounds: Normal breath sounds.  Abdominal:     General: Abdomen is flat. Bowel sounds are normal.  Neurological:     General: No focal deficit present.     Mental Status: He is alert. Mental status is at baseline.  Psychiatric:        Mood and Affect: Mood normal.        Behavior: Behavior normal.        Thought Content: Thought content normal.        Judgment: Judgment normal.      Lab results: No results found for this or any previous visit (from the past 24 hour(s)).  Imaging results:  No results found.   Assessment & Plan: Patient admitted to sickle cell day infusion center for management of pain crisis.  Patient is opiate tolerant Initiate IV dilaudid PCA. Settings of 0.5 mg, 10 minute lockout, and 3 mg/hr IV fluids, 0.45% saline at 150 ml/hr Toradol 15 mg IV times one dose Tylenol 1000 mg by mouth times one dose Review CBC with differential, complete metabolic panel, and reticulocytes as results become available. Baseline hemoglobin is 8-9 Pain intensity will be reevaluated in context of functioning and  relationship to baseline as care progress If pain intensity remains elevated and/or sudden change in hemodynamic stability transition to inpatient services for higher level of care.         Nolon Nations  APRN, MSN, FNP-C Patient Care [facility] [address] [place], [place] [address] [phone]  07/05/2020, 11:52 AM

## 2020-07-06 ENCOUNTER — Telehealth: Payer: Self-pay | Admitting: Family Medicine

## 2020-07-06 ENCOUNTER — Other Ambulatory Visit: Payer: Self-pay | Admitting: Family Medicine

## 2020-07-06 DIAGNOSIS — Z79891 Long term (current) use of opiate analgesic: Secondary | ICD-10-CM

## 2020-07-06 DIAGNOSIS — D571 Sickle-cell disease without crisis: Secondary | ICD-10-CM

## 2020-07-06 MED ORDER — OXYCODONE HCL 10 MG PO TABS: 10.0000 mg | ORAL_TABLET | ORAL | 0 refills | Status: DC | PRN

## 2020-07-06 NOTE — Telephone Encounter (Signed)
Done

## 2020-07-07 ENCOUNTER — Telehealth: Payer: Self-pay | Admitting: Family Medicine

## 2020-07-07 ENCOUNTER — Other Ambulatory Visit: Payer: Self-pay | Admitting: Family Medicine

## 2020-07-07 DIAGNOSIS — D571 Sickle-cell disease without crisis: Secondary | ICD-10-CM

## 2020-07-07 DIAGNOSIS — Z79891 Long term (current) use of opiate analgesic: Secondary | ICD-10-CM

## 2020-07-07 MED ORDER — OXYCODONE HCL 10 MG PO TABS: 10.0000 mg | ORAL_TABLET | ORAL | 0 refills | Status: DC | PRN

## 2020-07-07 MED FILL — oxyCODONE HCL 10 MG TABS: 10 | 15 days supply | Qty: 90 | Fill #0

## 2020-07-07 NOTE — Telephone Encounter (Signed)
Done

## 2020-07-12 ENCOUNTER — Encounter: Payer: Self-pay | Admitting: Nurse Practitioner

## 2020-07-12 ENCOUNTER — Other Ambulatory Visit: Payer: Self-pay

## 2020-07-12 ENCOUNTER — Ambulatory Visit (INDEPENDENT_AMBULATORY_CARE_PROVIDER_SITE_OTHER): Payer: Self-pay | Admitting: Nurse Practitioner

## 2020-07-12 VITALS — BP 140/67 | HR 80 | Temp 99.3°F | Ht 71.0 in | Wt 177.4 lb

## 2020-07-12 DIAGNOSIS — Z79891 Long term (current) use of opiate analgesic: Secondary | ICD-10-CM

## 2020-07-12 DIAGNOSIS — R801 Persistent proteinuria, unspecified: Secondary | ICD-10-CM

## 2020-07-12 DIAGNOSIS — R82998 Other abnormal findings in urine: Secondary | ICD-10-CM

## 2020-07-12 DIAGNOSIS — D571 Sickle-cell disease without crisis: Secondary | ICD-10-CM

## 2020-07-12 DIAGNOSIS — N029 Recurrent and persistent hematuria with unspecified morphologic changes: Secondary | ICD-10-CM

## 2020-07-12 LAB — POCT URINALYSIS DIPSTICK OB
Bilirubin, UA: NEGATIVE
Glucose, UA: NEGATIVE
Ketones, UA: NEGATIVE
Leukocytes, UA: NEGATIVE
Nitrite, UA: NEGATIVE
Spec Grav, UA: 1.015 (ref 1.010–1.025)
Urobilinogen, UA: 0.2 E.U./dL
pH, UA: 5.5 (ref 5.0–8.0)

## 2020-07-12 NOTE — Progress Notes (Signed)
[facility] Patient [facility] [address] [place], [place]  [address] Phone:  [phone]   Fax:  [phone]   Established Patient Office Visit  Subjective:  Patient ID: Jerry [name], male    DOB: 11-19-1986  Age: 34 y.o. MRN: [id number]  CC:  Chief Complaint  Patient presents with  . Follow-up    f/u from [facility] treatment    HPI Rasul CAN LUCCI presents for follow up. He  has a past medical history of Sickle cell anemia (HCC).   He has 3/10. He has leg pain. He admits that he pain in his chest and back has improved. He feels like the storm is worse. He is doing well otherwise. Denies fever, headache, cough, wheezing, shortness of breath, chest pains, abdominal pain, back pain or hip painn. Denies any open wounds, skin irritation.He works full-time and is able to work long hours 11 hours 7 days per week.   Past Medical History:  Diagnosis Date  . Sickle cell anemia (HCC)     History reviewed. No pertinent surgical history.  Family History  Problem Relation Age of Onset  . Sickle cell trait Mother     Social History   Socioeconomic History  . Marital status: Single    Spouse name: Not on file  . Number of children: Not on file  . Years of education: Not on file  . Highest education level: Not on file  Occupational History  . Not on file  Tobacco Use  . Smoking status: Never Smoker  . Smokeless tobacco: Never Used  Vaping Use  . Vaping Use: Never used  Substance and Sexual Activity  . Alcohol use: No    Comment: rare  . Drug use: Yes    Types: Marijuana    Comment: occasionally  . Sexual activity: Yes    Birth control/protection: Condom  Other Topics Concern  . Not on file  Social History Narrative  . Not on file   Social Determinants of Health   Financial Resource Strain:   . Difficulty of Paying Living Expenses:   Food Insecurity:   . Worried About Programme researcher, broadcasting/film/video in the Last Year:   . Barista in the Last Year:     Transportation Needs:   . Freight forwarder (Medical):   Marland Kitchen Lack of Transportation (Non-Medical):   Physical Activity:   . Days of Exercise per Week:   . Minutes of Exercise per Session:   Stress:   . Feeling of Stress :   Social Connections:   . Frequency of Communication with Friends and Family:   . Frequency of Social Gatherings with Friends and Family:   . Attends Religious Services:   . Active Member of Clubs or Organizations:   . Attends Banker Meetings:   Marland Kitchen Marital Status:   Intimate Partner Violence:   . Fear of Current or Ex-Partner:   . Emotionally Abused:   Marland Kitchen Physically Abused:   . Sexually Abused:     Outpatient Medications Prior to Visit  Medication Sig Dispense Refill  . lisinopril (ZESTRIL) 2.5 MG tablet Take 1 tablet (2.5 mg total) by mouth daily. 30 tablet 6  . Oxycodone HCl 10 MG TABS Take 1 tablet (10 mg total) by mouth every 4 (four) hours as needed for up to 15 days. 90 tablet 0  . folic acid (FOLVITE) 1 MG tablet Take 1 tablet (1 mg total) by mouth daily. (Patient not taking: Reported  on 04/14/2020) 30 tablet 6  . hydroxyurea (HYDREA) 500 MG capsule Take 3 capsules (1,500 mg total) by mouth daily. May take with food to minimize GI side effects. (Patient not taking: Reported on 04/14/2020) 90 capsule 6  . ibuprofen (ADVIL) 800 MG tablet Take 1 tablet, 3 times a day as needed. (Patient not taking: Reported on 04/14/2020) 30 tablet 6   No facility-administered medications prior to visit.    No Known Allergies  ROS Review of Systems    Objective:    Physical Exam Vitals reviewed.  Constitutional:      General: He is not in acute distress.    Appearance: He is not ill-appearing, toxic-appearing or diaphoretic.  HENT:     Head: Normocephalic and atraumatic.     Nose: Nose normal.     Mouth/Throat:     Mouth: Mucous membranes are moist.  Eyes:     General: Scleral icterus present.     Pupils: Pupils are equal, round, and reactive to  light.  Cardiovascular:     Rate and Rhythm: Normal rate and regular rhythm.     Pulses: Normal pulses.     Heart sounds: Normal heart sounds.  Pulmonary:     Effort: Pulmonary effort is normal.     Breath sounds: Normal breath sounds.  Abdominal:     General: Abdomen is flat. Bowel sounds are normal.     Palpations: Abdomen is soft.  Musculoskeletal:        General: Normal range of motion.     Cervical back: Normal range of motion.  Skin:    General: Skin is warm and dry.     Capillary Refill: Capillary refill takes less than 2 seconds.     Comments: Right shin abrasion healing.  Neurological:     General: No focal deficit present.     Mental Status: He is alert and oriented to person, place, and time.  Psychiatric:        Mood and Affect: Mood normal.        Behavior: Behavior normal.        Thought Content: Thought content normal.        Judgment: Judgment normal.     BP 140/67   Pulse 80   Temp 99.3 F (37.4 C)   Ht 5\' 11"  (1.803 m)   Wt 177 lb 6.4 oz (80.5 kg)   SpO2 95%   BMI 24.74 kg/m  Wt Readings from Last 3 Encounters:  07/12/20 177 lb 6.4 oz (80.5 kg)  06/14/20 175 lb (79.4 kg)  04/14/20 176 lb (79.8 kg)     Health Maintenance Due  Topic Date Due  . INFLUENZA VACCINE  06/27/2020    There are no preventive care reminders to display for this patient.  Lab Results  Component Value Date   TSH 0.809 07/04/2019   Lab Results  Component Value Date   WBC 20.0 (H) 07/05/2020   HGB 8.2 (L) 07/05/2020   HCT 23.3 (L) 07/05/2020   MCV 95.1 07/05/2020   PLT 286 07/05/2020   Lab Results  Component Value Date   NA 140 07/05/2020   K 4.6 07/05/2020   CO2 23 07/05/2020   GLUCOSE 77 07/05/2020   BUN 16 07/05/2020   CREATININE 0.89 07/05/2020   BILITOT 2.3 (H) 07/05/2020   ALKPHOS 67 07/05/2020   AST 67 (H) 07/05/2020   ALT 26 07/05/2020   PROT 8.0 07/05/2020   ALBUMIN 4.9 07/05/2020   CALCIUM 9.1 07/05/2020  ANIONGAP 9 07/05/2020   Lab Results   Component Value Date   CHOL 130 01/09/2017   Lab Results  Component Value Date   HDL 46 01/09/2017   Lab Results  Component Value Date   LDLCALC 68 01/09/2017   Lab Results  Component Value Date   TRIG 81 01/09/2017   Lab Results  Component Value Date   CHOLHDL 2.8 01/09/2017   No results found for: HGBA1C    Assessment & Plan:   Problem List Items Addressed This Visit      Other   Chronic prescription opiate use   Relevant Orders   [id number] 11+Oxyco+Alc+Crt-Bund   Persistent proteinuria Encourage patient to be compliant with his current regiment of lisinopril Encourage patient to maintain good hydration Encourage nephrology appointment   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 We discussed the risks and benefits of Hydrea, including bone marrow suppression, the possibility of GI upset, skin ulcers, hair thinning, and teratogenicity. The patient was reminded of the need to seek medical attention of any symptoms of bleeding, anemia, or infection. Continue folic acid 1 mg daily to prevent aplastic bone marrow crises.   Relevant Orders   [id number] 11+Oxyco+Alc+Crt-Bund    Other Visit Diagnoses    Leukocytes in urine       Relevant Orders   [id number] 11+Oxyco+Alc+Crt-Bund   POC Urinalysis Dipstick OB (Completed)   Recurrent and persistent hematuria          No orders of the defined types were placed in this encounter.   Follow-up: Return in about 2 months (around 09/11/2020).    Barbette Merino, NP

## 2020-07-20 LAB — DRUG SCREEN 764883 11+OXYCO+ALC+CRT-BUND
Amphetamines, Urine: NEGATIVE ng/mL
BENZODIAZ UR QL: NEGATIVE ng/mL
Barbiturate: NEGATIVE ng/mL
Cocaine (Metabolite): NEGATIVE ng/mL
Creatinine: 173.7 mg/dL (ref 20.0–300.0)
Ethanol: NEGATIVE %
Meperidine: NEGATIVE ng/mL
Methadone Screen, Urine: NEGATIVE ng/mL
OPIATE SCREEN URINE: NEGATIVE ng/mL
Phencyclidine: NEGATIVE ng/mL
Propoxyphene: NEGATIVE ng/mL
Tramadol: NEGATIVE ng/mL
pH, Urine: 4.9 (ref 4.5–8.9)

## 2020-07-20 LAB — OXYCODONE/OXYMORPHONE, CONFIRM
OXYCODONE/OXYMORPH: POSITIVE — AB
OXYCODONE: 920 ng/mL
OXYCODONE: POSITIVE — AB
OXYMORPHONE (GC/MS): 1416 ng/mL
OXYMORPHONE: POSITIVE — AB

## 2020-07-20 LAB — CANNABINOID CONFIRMATION, UR
CANNABINOIDS: POSITIVE — AB
Carboxy THC GC/MS Conf: 147 ng/mL

## 2020-07-21 ENCOUNTER — Telehealth: Payer: Self-pay | Admitting: Family Medicine

## 2020-07-22 ENCOUNTER — Telehealth: Payer: Self-pay | Admitting: Nurse Practitioner

## 2020-07-22 ENCOUNTER — Other Ambulatory Visit: Payer: Self-pay | Admitting: Nurse Practitioner

## 2020-07-22 DIAGNOSIS — D571 Sickle-cell disease without crisis: Secondary | ICD-10-CM

## 2020-07-22 DIAGNOSIS — Z79891 Long term (current) use of opiate analgesic: Secondary | ICD-10-CM

## 2020-07-22 MED ORDER — OXYCODONE HCL 10 MG PO TABS: 10.0000 mg | ORAL_TABLET | ORAL | 0 refills | Status: DC | PRN

## 2020-07-22 MED FILL — oxyCODONE HCL 10 MG TABS: 10 | 15 days supply | Qty: 90 | Fill #0

## 2020-07-22 NOTE — Telephone Encounter (Signed)
Refill sent.

## 2020-07-22 NOTE — Progress Notes (Unsigned)
   [facility] [address] [place], [place]  [address] Phone:  [phone]   Fax:  [phone] 

## 2020-07-22 NOTE — Progress Notes (Signed)
   [facility] Patient [facility] [address] [place], [place]  [address] Phone:  [phone]   Fax:  [phone] PDMP not reviewed this encounter.Subjective:    [name] calls for refill of his *oxycodone 10 mg.    Objective:   Indication for chronic opioid: Chronic pain related to sickle cell disease Medication and dose: Oxycodone 10 mg # pills per month: 180 Last UDS date: 07/12/2020 Opioid Treatment Agreement signed (Y/N): *Yes Opioid Treatment Agreement last reviewed with patient:  2021 NCCSRS reviewed this encounter (include red flags):  Yes no   Assessment:   Chronic Pain Syndrome related to Sickle Cell Disease    Plan:  Oxycodone 10 mg refilled [facility]  Return for follow-up appointment as scheduled.

## 2020-07-22 NOTE — Telephone Encounter (Signed)
oxyc 10 mg. needs to be sent to [facility] out pt pharm

## 2020-08-04 ENCOUNTER — Telehealth: Payer: Self-pay | Admitting: Nurse Practitioner

## 2020-08-04 NOTE — Telephone Encounter (Signed)
rx refill  oxyco 10mg  to [facility] outpatient pharm

## 2020-08-05 ENCOUNTER — Other Ambulatory Visit: Payer: Self-pay | Admitting: Nurse Practitioner

## 2020-08-05 DIAGNOSIS — Z79891 Long term (current) use of opiate analgesic: Secondary | ICD-10-CM

## 2020-08-05 DIAGNOSIS — D571 Sickle-cell disease without crisis: Secondary | ICD-10-CM

## 2020-08-05 MED ORDER — OXYCODONE HCL 10 MG PO TABS: 10.0000 mg | ORAL_TABLET | ORAL | 0 refills | Status: DC | PRN

## 2020-08-05 NOTE — Telephone Encounter (Signed)
Refill sent.

## 2020-08-05 NOTE — Progress Notes (Signed)
° °  [facility] Patient [facility] [address] [place], [place]  [address] Phone:  [phone]   Fax:  [phone] PDMP not reviewed this encounter.Subjective:    [name] calls for refill of his oxycodone.    Objective:   Indication for chronic opioid: Chronic pain related to sickle cell disease Medication and dose: *Oxycodone 10 mg every 4 hours as needed # pills per month: Quantity 180 Last UDS date: 07/12/2020 + for cannabinoids Opioid Treatment Agreement signed (Y/N): Yes Opioid Treatment Agreement last reviewed with patient:  2021 NCCSRS reviewed this encounter (include red flags):  Reviewed no red flags Assessment:   Chronic Pain Syndrome related to Sickle Cell Disease    Plan:   Refill oxycodone 10 mg sent to [facility] outpatient pharmacy.  Will discuss cannabinol use with patient at next follow-up visit  Return for follow-up appointment as scheduled.

## 2020-08-06 MED FILL — oxyCODONE HCL 10 MG TABS: 10 | 15 days supply | Qty: 90 | Fill #0

## 2020-08-16 ENCOUNTER — Encounter: Payer: Self-pay | Admitting: Nurse Practitioner

## 2020-08-16 ENCOUNTER — Other Ambulatory Visit: Payer: Self-pay

## 2020-08-16 ENCOUNTER — Ambulatory Visit (INDEPENDENT_AMBULATORY_CARE_PROVIDER_SITE_OTHER): Payer: Self-pay | Admitting: Nurse Practitioner

## 2020-08-16 VITALS — BP 121/65 | HR 66 | Temp 99.1°F | Ht 71.0 in | Wt 175.0 lb

## 2020-08-16 DIAGNOSIS — N029 Recurrent and persistent hematuria with unspecified morphologic changes: Secondary | ICD-10-CM

## 2020-08-16 DIAGNOSIS — D571 Sickle-cell disease without crisis: Secondary | ICD-10-CM

## 2020-08-16 NOTE — Progress Notes (Signed)
[facility] Patient [facility] [address] [place], [place]  [address] Phone:  [phone]   Fax:  [phone]    Established Patient Office Visit  Subjective:  Patient ID: Jerry [name], male    DOB: 1986/01/22  Age: 34 y.o. MRN: [id number]  CC: No chief complaint on file.   HPI Jerry [name] presents for follow up. He  has a past medical history of Sickle cell anemia (HCC).   He is having arm pain and knee pain. He feels like it is related to the weather. He rates a 3-4/10. Denies fever, headache, cough, wheezing, shortness of breath, chest pains, abdominal pain, back pain, hip pain, or leg pain. Denies any open wounds, skin irritation.  Last eye exam was     Past Medical History:  Diagnosis Date   Sickle cell anemia (HCC)     No past surgical history on file.  Family History  Problem Relation Age of Onset   Sickle cell trait Mother     Social History   Socioeconomic History   Marital status: Single    Spouse name: Not on file   Number of children: Not on file   Years of education: Not on file   Highest education level: Not on file  Occupational History   Not on file  Tobacco Use   Smoking status: Never Smoker   Smokeless tobacco: Never Used  Vaping Use   Vaping Use: Never used  Substance and Sexual Activity   Alcohol use: No    Comment: rare   Drug use: Yes    Types: Marijuana    Comment: occasionally   Sexual activity: Yes    Birth control/protection: Condom  Other Topics Concern   Not on file  Social History Narrative   Not on file   Social Determinants of Health   Financial Resource Strain:    Difficulty of Paying Living Expenses: Not on file  Food Insecurity:    Worried About Programme researcher, broadcasting/film/video in the Last Year: Not on file   [employer] of Food in the Last Year: Not on file  Transportation Needs:    Lack of Transportation (Medical): Not on file   Lack of Transportation (Non-Medical): Not on file    Physical Activity:    Days of Exercise per Week: Not on file   Minutes of Exercise per Session: Not on file  Stress:    Feeling of Stress : Not on file  Social Connections:    Frequency of Communication with Friends and Family: Not on file   Frequency of Social Gatherings with Friends and Family: Not on file   Attends Religious Services: Not on file   Active Member of Clubs or Organizations: Not on file   Attends Banker Meetings: Not on file   Marital Status: Not on file  Intimate Partner Violence:    Fear of Current or Ex-Partner: Not on file   Emotionally Abused: Not on file   Physically Abused: Not on file   Sexually Abused: Not on file    Outpatient Medications Prior to Visit  Medication Sig Dispense Refill   lisinopril (ZESTRIL) 2.5 MG tablet Take 1 tablet (2.5 mg total) by mouth daily. 30 tablet 6   Oxycodone HCl 10 MG TABS Take 1 tablet (10 mg total) by mouth every 4 (four) hours as needed for up to 15 days. 90 tablet 0   folic acid (FOLVITE) 1 MG tablet Take 1 tablet (1 mg  total) by mouth daily. (Patient not taking: Reported on 04/14/2020) 30 tablet 6   hydroxyurea (HYDREA) 500 MG capsule Take 3 capsules (1,500 mg total) by mouth daily. May take with food to minimize GI side effects. (Patient not taking: Reported on 04/14/2020) 90 capsule 6   ibuprofen (ADVIL) 800 MG tablet Take 1 tablet, 3 times a day as needed. (Patient not taking: Reported on 04/14/2020) 30 tablet 6   No facility-administered medications prior to visit.    No Known Allergies  ROS Review of Systems  All other systems reviewed and are negative.     Objective:    Physical Exam Constitutional:      General: He is not in acute distress.    Appearance: He is normal weight. He is not ill-appearing, toxic-appearing or diaphoretic.  HENT:     Head: Normocephalic and atraumatic.     Nose: Nose normal.     Mouth/Throat:     Mouth: Mucous membranes are moist.   Cardiovascular:     Rate and Rhythm: Normal rate.  Musculoskeletal:        General: Normal range of motion.     Cervical back: Normal range of motion.  Skin:    General: Skin is warm and dry.     Capillary Refill: Capillary refill takes less than 2 seconds.  Neurological:     General: No focal deficit present.     Mental Status: He is alert and oriented to person, place, and time.  Psychiatric:        Mood and Affect: Mood normal.        Behavior: Behavior normal.        Thought Content: Thought content normal.        Judgment: Judgment normal.     BP 121/65    Pulse 66    Temp 99.1 F (37.3 C) (Temporal)    Ht 5\' 11"  (1.803 m)    Wt 175 lb (79.4 kg)    SpO2 94%    BMI 24.41 kg/m  Wt Readings from Last 3 Encounters:  08/16/20 175 lb (79.4 kg)  07/12/20 177 lb 6.4 oz (80.5 kg)  06/14/20 175 lb (79.4 kg)     Health Maintenance Due  Topic Date Due   COVID-19 Vaccine (1) Never done   INFLUENZA VACCINE  06/27/2020    There are no preventive care reminders to display for this patient.  Lab Results  Component Value Date   TSH 0.809 07/04/2019   Lab Results  Component Value Date   WBC 20.0 (H) 07/05/2020   HGB 8.2 (L) 07/05/2020   HCT 23.3 (L) 07/05/2020   MCV 95.1 07/05/2020   PLT 286 07/05/2020   Lab Results  Component Value Date   NA 140 07/05/2020   K 4.6 07/05/2020   CO2 23 07/05/2020   GLUCOSE 77 07/05/2020   BUN 16 07/05/2020   CREATININE 0.89 07/05/2020   BILITOT 2.3 (H) 07/05/2020   ALKPHOS 67 07/05/2020   AST 67 (H) 07/05/2020   ALT 26 07/05/2020   PROT 8.0 07/05/2020   ALBUMIN 4.9 07/05/2020   CALCIUM 9.1 07/05/2020   ANIONGAP 9 07/05/2020   Lab Results  Component Value Date   CHOL 130 01/09/2017   Lab Results  Component Value Date   HDL 46 01/09/2017   Lab Results  Component Value Date   LDLCALC 68 01/09/2017   Lab Results  Component Value Date   TRIG 81 01/09/2017   Lab Results  Component Value  Date   CHOLHDL 2.8 01/09/2017    No results found for: HGBA1C    Assessment & Plan:   Problem List Items Addressed This Visit      Other   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 Move frequently to reduce venous thromboembolism risk. Avoid situations that could lead to dehydration or could exacerbate pain Discussed S&S of infection, seizures, stroke acute chest, DVT and how important it is to seek medical attention      Other Visit Diagnoses    Recurrent and persistent hematuria          No orders of the defined types were placed in this encounter.   Follow-up: Return in about 4 weeks (around 09/13/2020).    Barbette Merino, NP

## 2020-08-20 ENCOUNTER — Telehealth: Payer: Self-pay | Admitting: Nurse Practitioner

## 2020-08-20 ENCOUNTER — Other Ambulatory Visit: Payer: Self-pay | Admitting: Nurse Practitioner

## 2020-08-20 DIAGNOSIS — Z79891 Long term (current) use of opiate analgesic: Secondary | ICD-10-CM

## 2020-08-20 DIAGNOSIS — D571 Sickle-cell disease without crisis: Secondary | ICD-10-CM

## 2020-08-20 MED ORDER — OXYCODONE HCL 10 MG PO TABS: 10.0000 mg | ORAL_TABLET | ORAL | 0 refills | Status: DC | PRN

## 2020-08-20 MED FILL — oxyCODONE HCL 10 MG TABS: 10 | 15 days supply | Qty: 90 | Fill #0

## 2020-08-23 NOTE — Telephone Encounter (Signed)
Sent to provider 

## 2020-09-06 ENCOUNTER — Telehealth: Payer: Self-pay | Admitting: Nurse Practitioner

## 2020-09-06 ENCOUNTER — Other Ambulatory Visit: Payer: Self-pay | Admitting: Nurse Practitioner

## 2020-09-06 DIAGNOSIS — D571 Sickle-cell disease without crisis: Secondary | ICD-10-CM

## 2020-09-06 DIAGNOSIS — Z79891 Long term (current) use of opiate analgesic: Secondary | ICD-10-CM

## 2020-09-06 MED ORDER — OXYCODONE HCL 10 MG PO TABS: 10.0000 mg | ORAL_TABLET | ORAL | 0 refills | Status: DC | PRN

## 2020-09-06 MED FILL — oxyCODONE HCL 10 MG TABS: 10 | 15 days supply | Qty: 90 | Fill #0

## 2020-09-06 NOTE — Telephone Encounter (Signed)
Sent!

## 2020-09-15 ENCOUNTER — Ambulatory Visit (INDEPENDENT_AMBULATORY_CARE_PROVIDER_SITE_OTHER): Payer: Medicaid Other | Admitting: Nurse Practitioner

## 2020-09-15 ENCOUNTER — Encounter: Payer: Self-pay | Admitting: Nurse Practitioner

## 2020-09-15 ENCOUNTER — Other Ambulatory Visit: Payer: Self-pay

## 2020-09-15 VITALS — BP 128/92 | HR 82 | Temp 97.7°F | Ht 71.0 in | Wt 181.0 lb

## 2020-09-15 DIAGNOSIS — R801 Persistent proteinuria, unspecified: Secondary | ICD-10-CM | POA: Diagnosis not present

## 2020-09-15 DIAGNOSIS — D571 Sickle-cell disease without crisis: Secondary | ICD-10-CM | POA: Diagnosis not present

## 2020-09-15 DIAGNOSIS — Z79891 Long term (current) use of opiate analgesic: Secondary | ICD-10-CM | POA: Diagnosis not present

## 2020-09-15 NOTE — Progress Notes (Signed)
[facility] Patient [facility] [address] [place], [place]  [address] Phone:  [phone]   Fax:  [phone]   Established Patient Office Visit  Subjective:  Patient ID: Jerry [name], male    DOB: 1986-01-22  Age: 34 y.o. MRN: [id number]  CC:  Chief Complaint  Patient presents with   Follow-up    HPI Jerry [name] presents for follow up. He  has a past medical history of Sickle cell anemia (HCC).   He is in today for a follow up. He Denies fever, headache, cough, wheezing, shortness of breath, chest pains, abdominal pain, back pain, hip pain, or leg pain. Denies any open wounds, skin irritation. He is active daily and tolerates this without difficulty. He admits that he is ready to be seen by nephrology not that his insurance has been reinstated.   Past Medical History:  Diagnosis Date   Sickle cell anemia (HCC)     History reviewed. No pertinent surgical history.  Family History  Problem Relation Age of Onset   Sickle cell trait Mother     Social History   Socioeconomic History   Marital status: Single    Spouse name: Not on file   Number of children: Not on file   Years of education: Not on file   Highest education level: Not on file  Occupational History   Not on file  Tobacco Use   Smoking status: Never Smoker   Smokeless tobacco: Never Used  Vaping Use   Vaping Use: Never used  Substance and Sexual Activity   Alcohol use: No    Comment: rare   Drug use: Yes    Types: Marijuana    Comment: occasionally   Sexual activity: Yes    Birth control/protection: Condom  Other Topics Concern   Not on file  Social History Narrative   Not on file   Social Determinants of Health   Financial Resource Strain:    Difficulty of Paying Living Expenses: Not on file  Food Insecurity:    Worried About Programme researcher, broadcasting/film/video in the Last Year: Not on file   [employer] of Food in the Last Year: Not on file  Transportation Needs:     Lack of Transportation (Medical): Not on file   Lack of Transportation (Non-Medical): Not on file  Physical Activity:    Days of Exercise per Week: Not on file   Minutes of Exercise per Session: Not on file  Stress:    Feeling of Stress : Not on file  Social Connections:    Frequency of Communication with Friends and Family: Not on file   Frequency of Social Gatherings with Friends and Family: Not on file   Attends Religious Services: Not on file   Active Member of Clubs or Organizations: Not on file   Attends Banker Meetings: Not on file   Marital Status: Not on file  Intimate Partner Violence:    Fear of Current or Ex-Partner: Not on file   Emotionally Abused: Not on file   Physically Abused: Not on file   Sexually Abused: Not on file    Outpatient Medications Prior to Visit  Medication Sig Dispense Refill   lisinopril (ZESTRIL) 2.5 MG tablet Take 1 tablet (2.5 mg total) by mouth daily. 30 tablet 6   folic acid (FOLVITE) 1 MG tablet Take 1 tablet (1 mg total) by mouth daily. (Patient not taking: Reported on 04/14/2020) 30 tablet 6   hydroxyurea (HYDREA)  500 MG capsule Take 3 capsules (1,500 mg total) by mouth daily. May take with food to minimize GI side effects. (Patient not taking: Reported on 04/14/2020) 90 capsule 6   ibuprofen (ADVIL) 800 MG tablet Take 1 tablet, 3 times a day as needed. (Patient not taking: Reported on 04/14/2020) 30 tablet 6   Oxycodone HCl 10 MG TABS Take 1 tablet (10 mg total) by mouth every 4 (four) hours as needed for up to 15 days. 90 tablet 0   No facility-administered medications prior to visit.    No Known Allergies  ROS Review of Systems    Objective:    Physical Exam Constitutional:      General: He is not in acute distress.    Appearance: He is normal weight. He is not toxic-appearing or diaphoretic.  HENT:     Head: Normocephalic and atraumatic.     Nose: Nose normal.     Mouth/Throat:     Mouth:  Mucous membranes are moist.  Cardiovascular:     Rate and Rhythm: Normal rate and regular rhythm.     Pulses: Normal pulses.  Pulmonary:     Effort: Pulmonary effort is normal.  Abdominal:     Palpations: Abdomen is soft.  Musculoskeletal:        General: Normal range of motion.     Cervical back: Normal range of motion.  Skin:    General: Skin is warm and dry.     Capillary Refill: Capillary refill takes less than 2 seconds.  Neurological:     General: No focal deficit present.     Mental Status: He is alert and oriented to person, place, and time.  Psychiatric:        Mood and Affect: Mood normal.        Behavior: Behavior normal.        Thought Content: Thought content normal.        Judgment: Judgment normal.     BP (!) 128/92 (BP Location: Left Arm, Patient Position: Sitting, Cuff Size: Normal)    Pulse 82    Temp 97.7 F (36.5 C) (Temporal)    Ht 5\' 11"  (1.803 m)    Wt 181 lb (82.1 kg)    SpO2 97%    BMI 25.24 kg/m  Wt Readings from Last 3 Encounters:  09/15/20 181 lb (82.1 kg)  08/16/20 175 lb (79.4 kg)  07/12/20 177 lb 6.4 oz (80.5 kg)     There are no preventive care reminders to display for this patient.  There are no preventive care reminders to display for this patient.  Lab Results  Component Value Date   TSH 0.809 07/04/2019   Lab Results  Component Value Date   WBC 20.0 (H) 07/05/2020   HGB 8.2 (L) 07/05/2020   HCT 23.3 (L) 07/05/2020   MCV 95.1 07/05/2020   PLT 286 07/05/2020   Lab Results  Component Value Date   NA 140 07/05/2020   K 4.6 07/05/2020   CO2 23 07/05/2020   GLUCOSE 77 07/05/2020   BUN 16 07/05/2020   CREATININE 0.89 07/05/2020   BILITOT 2.3 (H) 07/05/2020   ALKPHOS 67 07/05/2020   AST 67 (H) 07/05/2020   ALT 26 07/05/2020   PROT 8.0 07/05/2020   ALBUMIN 4.9 07/05/2020   CALCIUM 9.1 07/05/2020   ANIONGAP 9 07/05/2020   Lab Results  Component Value Date   CHOL 130 01/09/2017   Lab Results  Component Value Date   HDL  46  01/09/2017   Lab Results  Component Value Date   LDLCALC 68 01/09/2017   Lab Results  Component Value Date   TRIG 81 01/09/2017   Lab Results  Component Value Date   CHOLHDL 2.8 01/09/2017   No results found for: HGBA1C    Assessment & Plan:   Problem List Items Addressed This Visit      Other   Chronic prescription opiate use   Relevant Medications   Oxycodone HCl 10 MG TABS (Start on 09/21/2020)   Persistent proteinuria   Relevant Orders   Ambulatory referral to Nephrology   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 We discussed the risks and benefits of Hydrea, including bone marrow suppression, the possibility of GI upset, skin ulcers, hair thinning, and teratogenicity. The patient was reminded of the need to seek medical attention of any symptoms of bleeding, anemia, or infection. Continue folic acid 1 mg daily to prevent aplastic bone marrow crises.    Relevant Medications   Oxycodone HCl 10 MG TABS (Start on 09/21/2020)      Meds ordered this encounter  Medications   Oxycodone HCl 10 MG TABS    Sig: Take 1 tablet (10 mg total) by mouth every 4 (four) hours as needed for up to 15 days.    Dispense:  90 tablet    Refill:  0    Follow-up: Return in about 3 months (around 12/16/2020).    Barbette Merino, NP

## 2020-09-20 ENCOUNTER — Telehealth: Payer: Self-pay | Admitting: Nurse Practitioner

## 2020-09-20 MED ORDER — OXYCODONE HCL 10 MG PO TABS: 10.0000 mg | ORAL_TABLET | ORAL | 0 refills | Status: DC | PRN

## 2020-09-20 NOTE — Telephone Encounter (Signed)
Geraldine Contras this was sent already thanks

## 2020-10-11 ENCOUNTER — Telehealth: Payer: Self-pay | Admitting: Nurse Practitioner

## 2020-10-12 ENCOUNTER — Other Ambulatory Visit: Payer: Self-pay | Admitting: Nurse Practitioner

## 2020-10-12 DIAGNOSIS — D571 Sickle-cell disease without crisis: Secondary | ICD-10-CM

## 2020-10-12 DIAGNOSIS — Z79891 Long term (current) use of opiate analgesic: Secondary | ICD-10-CM

## 2020-10-12 MED ORDER — OXYCODONE HCL 10 MG PO TABS: 10.0000 mg | ORAL_TABLET | ORAL | 0 refills | Status: DC | PRN

## 2020-10-12 MED FILL — oxyCODONE HCL 10 MG TABS: 10 | 15 days supply | Qty: 90 | Fill #0

## 2020-10-12 NOTE — Telephone Encounter (Signed)
sent 

## 2020-10-27 ENCOUNTER — Other Ambulatory Visit: Payer: Self-pay | Admitting: Nurse Practitioner

## 2020-10-27 ENCOUNTER — Telehealth: Payer: Self-pay | Admitting: Family Medicine

## 2020-10-27 DIAGNOSIS — D571 Sickle-cell disease without crisis: Secondary | ICD-10-CM

## 2020-10-27 DIAGNOSIS — Z79891 Long term (current) use of opiate analgesic: Secondary | ICD-10-CM

## 2020-10-27 NOTE — Telephone Encounter (Signed)
sent 

## 2020-10-28 MED FILL — oxyCODONE HCL 10 MG TABS: 10 | 15 days supply | Qty: 90 | Fill #0

## 2020-11-02 NOTE — Telephone Encounter (Signed)
Please see refill request.

## 2020-11-03 ENCOUNTER — Encounter (HOSPITAL_COMMUNITY): Payer: Self-pay

## 2020-11-03 ENCOUNTER — Other Ambulatory Visit: Payer: Self-pay

## 2020-11-03 ENCOUNTER — Ambulatory Visit (INDEPENDENT_AMBULATORY_CARE_PROVIDER_SITE_OTHER): Payer: Medicaid Other

## 2020-11-03 ENCOUNTER — Telehealth (HOSPITAL_COMMUNITY): Payer: Self-pay

## 2020-11-03 ENCOUNTER — Ambulatory Visit (HOSPITAL_COMMUNITY)
Admission: EM | Admit: 2020-11-03 | Discharge: 2020-11-03 | Disposition: A | Discharge status: Home / Self Care | Payer: Medicaid Other | Attending: Family Medicine | Admitting: Family Medicine

## 2020-11-03 DIAGNOSIS — J189 Pneumonia, unspecified organism: Secondary | ICD-10-CM

## 2020-11-03 DIAGNOSIS — R0602 Shortness of breath: Secondary | ICD-10-CM | POA: Diagnosis not present

## 2020-11-03 DIAGNOSIS — R059 Cough, unspecified: Secondary | ICD-10-CM | POA: Diagnosis not present

## 2020-11-03 MED ORDER — AZITHROMYCIN 250 MG PO TABS
ORAL_TABLET | ORAL | 0 refills | Status: DC
Start: 2020-11-03 — End: 2020-11-03

## 2020-11-03 MED ORDER — PREDNISONE 20 MG PO TABS: 40.0000 mg | ORAL_TABLET | Freq: Every day | ORAL | 0 refills | Status: DC

## 2020-11-03 MED ORDER — AZITHROMYCIN 250 MG PO TABS: ORAL_TABLET | ORAL | 0 refills | Status: DC

## 2020-11-03 MED ORDER — PROMETHAZINE-DM 6.25-15 MG/5ML PO SYRP
5.0000 mL | ORAL_SOLUTION | Freq: Four times a day (QID) | ORAL | 0 refills | Status: DC | PRN
Start: 2020-11-03 — End: 2020-11-03

## 2020-11-03 MED ORDER — PROMETHAZINE-DM 6.25-15 MG/5ML PO SYRP
5.0000 mL | ORAL_SOLUTION | Freq: Four times a day (QID) | ORAL | 0 refills | Status: DC | PRN
Start: 2020-11-03 — End: 2021-07-21

## 2020-11-03 MED ORDER — ALBUTEROL SULFATE HFA 108 (90 BASE) MCG/ACT IN AERS
2.0000 | INHALATION_SPRAY | Freq: Once | RESPIRATORY_TRACT | Status: AC
  Administered 2020-11-03: 2 via RESPIRATORY_TRACT

## 2020-11-03 MED ORDER — PREDNISONE 20 MG PO TABS
40.0000 mg | ORAL_TABLET | Freq: Every day | ORAL | 0 refills | Status: DC
Start: 2020-11-03 — End: 2020-11-03

## 2020-11-03 MED ORDER — ALBUTEROL SULFATE HFA 108 (90 BASE) MCG/ACT IN AERS
INHALATION_SPRAY | RESPIRATORY_TRACT | Status: AC
  Filled 2020-11-03: qty 6.7

## 2020-11-03 MED FILL — AZITHROMYCIN 250 MG TABLET: 250 | 5 days supply | Qty: 6 | Fill #0

## 2020-11-03 MED FILL — PROMETHAZINE-DM 6.25-15 MG/: 6.25-15 | 5 days supply | Qty: 100 | Fill #0

## 2020-11-03 MED FILL — predniSONE 20 MG TABS: 20 | 5 days supply | Qty: 10 | Fill #0

## 2020-11-03 NOTE — Discharge Instructions (Signed)
Take 2 puffs every 4-6 hours as needed for wheezing, chest tightness with your albuterol inhaler

## 2020-11-03 NOTE — ED Triage Notes (Signed)
Pt presents with complaints of cough, nasal congestion, wheezing, and runny nose x 2 weeks. Pt is not concerned for covid, states family has had a cold and the children have been to the doctor and their covid test was negative. Pt is not having any relief with otc medications.

## 2020-11-03 NOTE — ED Provider Notes (Signed)
[facility]    CSN: [id number] Arrival date & time: 11/03/20  1134      History   Chief Complaint Chief Complaint  Patient presents with  . Cough  . Nasal Congestion    HPI Jerry [name] is a 34 y.o. male.   Here today with 2 week history of progressively worsening productive cough, wheezing, rattling in chest. Previously also had congestion, sore throat, fatigue but those sxs have mostly resolved. Denies known fever, sweats, body aches, CP, severe SOB. Has been taking mucinex, vitamins, theraflu without any relief. States family members also had colds but everyone else is feeling better, all tested COVID neg. Does smoke cigarettes, no known hx of pulmonary dz. Hx of sickle cell anemia.      Past Medical History:  Diagnosis Date  . Sickle cell anemia [facility])     Patient Active Problem List   Diagnosis Date Noted  . Urine test positive for microalbuminuria 06/17/2020  . Hematuria with proteinuria 04/21/2020  . Sickle cell crisis (HCC) 10/08/2019  . Chronic prescription opiate use 10/07/2019  . Persistent proteinuria 10/07/2019  . Chest discomfort 10/07/2019  . 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  . Numbness and tingling of right face   . Leukocytosis   . Sickle cell pain crisis (HCC) 11/11/2017  . Vitamin D deficiency 04/26/2017  . Problems related to release from prison 01/09/2017  . 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 TOBACCO ABUSE 02/15/2007  . MARIJUANA ABUSE 02/15/2007  . 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 No pertinent surgical history.     Home Medications    Prior to Admission medications   Medication Sig Start Date End Date Taking? Authorizing Provider  azithromycin (ZITHROMAX) 250 MG tablet Take 2 tabs day one, then 1 tab daily until complete 11/03/20   Particia Nearing, PA-C  folic acid (FOLVITE) 1 MG tablet Take 1 tablet (1 mg total) by mouth daily. Patient not taking: Reported on 04/14/2020 01/02/20    Kallie Locks, FNP  hydroxyurea (HYDREA) 500 MG capsule Take 3 capsules (1,500 mg total) by mouth daily. May take with food to minimize GI side effects. Patient not taking: Reported on 04/14/2020 01/02/20   Kallie Locks, FNP  ibuprofen (ADVIL) 800 MG tablet Take 1 tablet, 3 times a day as needed. Patient not taking: Reported on 04/14/2020 10/22/19   Kallie Locks, FNP  lisinopril (ZESTRIL) 2.5 MG tablet Take 1 tablet (2.5 mg total) by mouth daily. 05/03/20   Barbette Merino, NP  Oxycodone HCl 10 MG TABS TAKE 1 TABLET BY MOUTH EVERY 4 HOURS AS NEEDED FOR UP TO 15 DAYS. 10/27/20   Barbette Merino, NP  predniSONE (DELTASONE) 20 MG tablet Take 2 tablets (40 mg total) by mouth daily with breakfast. 11/03/20   Particia Nearing, PA-C  promethazine-dextromethorphan (PROMETHAZINE-DM) 6.25-15 MG/5ML syrup Take 5 mLs by mouth 4 (four) times daily as needed for cough. 11/03/20   Particia Nearing, PA-C    Family History Family History  Problem Relation Age of Onset  . Sickle cell trait Mother     Social History Social History   Tobacco Use  . Smoking status: Never Smoker  . Smokeless tobacco: Never Used  Vaping Use  . Vaping Use: Never used  Substance Use Topics  . Alcohol use: No    Comment: rare  . Drug use: Yes    Types: Marijuana    Comment: occasionally     Allergies  Patient has no known allergies.   Review of Systems Review of Systems PER HPI   Physical Exam Triage Vital Signs ED Triage Vitals  Enc Vitals Group     BP 11/03/20 1234 130/65     Pulse Rate 11/03/20 1234 76     Resp 11/03/20 1234 19     Temp 11/03/20 1234 98.5 F (36.9 C)     Temp src --      SpO2 11/03/20 1234 90 %     Weight --      Height --      Head Circumference --      Peak Flow --      Pain Score 11/03/20 1233 0     Pain Loc --      Pain Edu? --      Excl. in GC? --    No data found.  Updated Vital Signs BP 130/65   Pulse 76   Temp 98.5 F (36.9 C)   Resp 19    SpO2 90%   Visual Acuity Right Eye Distance:   Left Eye Distance:   Bilateral Distance:    Right Eye Near:   Left Eye Near:    Bilateral Near:     Physical Exam Vitals and nursing note reviewed.  Constitutional:      Appearance: Normal appearance.  HENT:     Head: Atraumatic.     Right Ear: Tympanic membrane normal.     Left Ear: Tympanic membrane normal.     Nose: Nose normal.     Mouth/Throat:     Mouth: Mucous membranes are moist.     Pharynx: Posterior oropharyngeal erythema present.  Eyes:     Extraocular Movements: Extraocular movements intact.     Conjunctiva/sclera: Conjunctivae normal.  Cardiovascular:     Rate and Rhythm: Normal rate and regular rhythm.  Pulmonary:     Effort: Pulmonary effort is normal. No respiratory distress.     Breath sounds: Wheezing (moderate, diffuse) and rales (right base) present.     Comments: Speaking in full sentences comfortably, able to walk without labored breathing  Abdominal:     General: Bowel sounds are normal. There is no distension.     Palpations: Abdomen is soft.     Tenderness: There is no abdominal tenderness. There is no right CVA tenderness, left CVA tenderness or guarding.  Musculoskeletal:        General: Normal range of motion.     Cervical back: Normal range of motion and neck supple.  Skin:    General: Skin is warm and dry.  Neurological:     General: No focal deficit present.     Mental Status: He is oriented to person, place, and time.  Psychiatric:        Mood and Affect: Mood normal.        Thought Content: Thought content normal.        Judgment: Judgment normal.      UC Treatments / Results  Labs (all labs ordered are listed, but only abnormal results are displayed) Labs Reviewed - No data to display  EKG   Radiology DG Chest 2 View  Result Date: 11/03/2020 CLINICAL DATA:  Cough and short of breath. EXAM: CHEST - 2 VIEW COMPARISON:  11/19/2018 FINDINGS: Heart size upper normal. Normal  vascularity. Mild airspace disease in the right lateral lung base new since the prior study. Left lung clear. No effusion. Sclerotic bony changes compatible with sickle cell IMPRESSION: Mild right  lower lobe atelectasis/infiltrate. Electronically Signed   By: Marlan Palau M.D.   On: 11/03/2020 13:35    Procedures Procedures (including critical care time)  Medications Ordered in UC Medications  albuterol (VENTOLIN HFA) 108 (90 Base) MCG/ACT inhaler 2 puff (2 puffs Inhalation Given 11/03/20 1333)    Initial Impression / Assessment and Plan / UC Course  I have reviewed the triage vital signs and the nursing notes.  Pertinent labs & imaging results that were available during my care of the patient were reviewed by me and considered in my medical decision making (see chart for details).     Chest x-ray today showing possible RLL infiltrate, O2 saturation 90% but overall in no distress, afebrile and appears to be compensating well with otherwise normal vitals. Albuterol given in clinic and sent home with him, lung sounds mildly improved after use on re-examination. Azithromycin, prednisone burst, phenergan DM, continued mucinex. Strict ED precautions given for worsening sxs. Declines resp testing today.  Final Clinical Impressions(s) / UC Diagnoses   Final diagnoses:  Pneumonia of right lower lobe due to infectious organism     Discharge Instructions     Take 2 puffs every 4-6 hours as needed for wheezing, chest tightness with your albuterol inhaler    ED Prescriptions    Medication Sig Dispense Auth. Provider   azithromycin (ZITHROMAX) 250 MG tablet  (Status: Discontinued) Take 2 tabs day one, then 1 tab daily until complete 6 tablet Particia Nearing, PA-C   predniSONE (DELTASONE) 20 MG tablet  (Status: Discontinued) Take 2 tablets (40 mg total) by mouth daily with breakfast. 10 tablet Particia Nearing, PA-C   promethazine-dextromethorphan (PROMETHAZINE-DM) 6.25-15 MG/5ML  syrup  (Status: Discontinued) Take 5 mLs by mouth 4 (four) times daily as needed for cough. 100 mL Particia Nearing, [place]   promethazine-dextromethorphan (PROMETHAZINE-DM) 6.25-15 MG/5ML syrup Take 5 mLs by mouth 4 (four) times daily as needed for cough. 100 mL Particia Nearing, PA-C   predniSONE (DELTASONE) 20 MG tablet Take 2 tablets (40 mg total) by mouth daily with breakfast. 10 tablet Particia Nearing, PA-C   azithromycin (ZITHROMAX) 250 MG tablet Take 2 tabs day one, then 1 tab daily until complete 6 tablet Particia Nearing, PA-C     PDMP not reviewed this encounter.   Particia Nearing, [place] 11/03/20 1404

## 2020-11-05 ENCOUNTER — Other Ambulatory Visit: Payer: Self-pay | Admitting: Nurse Practitioner

## 2020-11-05 DIAGNOSIS — D571 Sickle-cell disease without crisis: Secondary | ICD-10-CM

## 2020-11-05 DIAGNOSIS — Z79891 Long term (current) use of opiate analgesic: Secondary | ICD-10-CM

## 2020-11-05 MED ORDER — OXYCODONE HCL 10 MG PO TABS: ORAL_TABLET | ORAL | 0 refills | Status: DC

## 2020-11-05 NOTE — Telephone Encounter (Signed)
sent 

## 2020-11-10 ENCOUNTER — Telehealth: Payer: Self-pay | Admitting: Nurse Practitioner

## 2020-11-10 NOTE — Telephone Encounter (Signed)
This is in; sent

## 2020-11-12 MED FILL — oxyCODONE HCL 10 MG TABS: 10 | 15 days supply | Qty: 90 | Fill #0

## 2020-11-30 ENCOUNTER — Telehealth: Payer: Self-pay | Admitting: Nurse Practitioner

## 2020-11-30 NOTE — Telephone Encounter (Signed)
Patient called for refill on Oxycodone 10mg . Uses [facility]  outpatient pharmacy

## 2020-12-01 ENCOUNTER — Other Ambulatory Visit: Payer: Self-pay | Admitting: Nurse Practitioner

## 2020-12-01 DIAGNOSIS — Z79891 Long term (current) use of opiate analgesic: Secondary | ICD-10-CM

## 2020-12-01 DIAGNOSIS — D571 Sickle-cell disease without crisis: Secondary | ICD-10-CM

## 2020-12-01 MED ORDER — OXYCODONE HCL 10 MG PO TABS: 10.0000 mg | ORAL_TABLET | ORAL | 0 refills | Status: DC | PRN

## 2020-12-01 MED FILL — oxyCODONE HCL 10 MG TABS: 10 | 15 days supply | Qty: 90 | Fill #0

## 2020-12-01 NOTE — Telephone Encounter (Signed)
Refill sent. Thanks

## 2020-12-16 ENCOUNTER — Telehealth (INDEPENDENT_AMBULATORY_CARE_PROVIDER_SITE_OTHER): Payer: Medicaid Other | Admitting: Nurse Practitioner

## 2020-12-16 ENCOUNTER — Other Ambulatory Visit: Payer: Self-pay | Admitting: Nurse Practitioner

## 2020-12-16 ENCOUNTER — Encounter: Payer: Self-pay | Admitting: Nurse Practitioner

## 2020-12-16 DIAGNOSIS — D571 Sickle-cell disease without crisis: Secondary | ICD-10-CM

## 2020-12-16 DIAGNOSIS — Z79891 Long term (current) use of opiate analgesic: Secondary | ICD-10-CM

## 2020-12-16 MED ORDER — OXYCODONE HCL 10 MG PO TABS: 10.0000 mg | ORAL_TABLET | ORAL | 0 refills | Status: DC | PRN

## 2020-12-16 MED FILL — oxyCODONE HCL 10 MG TABS: 10 | 15 days supply | Qty: 90 | Fill #0

## 2020-12-16 NOTE — Progress Notes (Signed)
   [facility] Patient [facility] [address] [place], [place]  [address] Phone:  [phone]   Fax:  [phone] Virtual Visit via Telephone Note  I connected with [name] on 12/16/20 at 10:00 AM EST by telephone and verified that I am speaking with the correct person using two identifiers.   I discussed the limitations, risks, security and privacy concerns of performing an evaluation and management service by telephone and the availability of in person appointments. I also discussed with the patient that there may be a patient responsible charge related to this service. The patient expressed understanding and agreed to proceed.  Patient home Provider Office  History of Present Illness:  He  has a past medical history of Sickle cell anemia (HCC).  He admits that his grandfather passed and he resided in [place]. He had mentioned the extreme temperature. He is concern about how to best care for himself . He has tested COVID -. Denies fever, headache, cough, wheezing, shortness of breath, chest pains, abdominal pain, back pain, hip pain, or leg pain. Denies any open wounds, skin irritation.   Observations/Objective: Virtual visit  Assessment and Plan: Assessment  Primary Diagnosis & Pertinent Problem List: Diagnoses of Hb-SS disease without crisis (HCC) and Chronic prescription opiate use were pertinent to this visit.  Visit Diagnosis: 1. Hb-SS disease without crisis (HCC)  Ensure adequate hydration. Move frequently to reduce venous thromboembolism risk. Avoid situations that could lead to dehydration or could exacerbate pain Discussed S&S of infection, seizures, stroke acute chest, DVT and how important it is to seek medical attention Take medication as directed along with pain contract and overall compliance  2. Chronic prescription opiate use     Follow Up Instructions: Fu 2 months   I discussed the assessment and treatment plan with the patient. The patient was  provided an opportunity to ask questions and all were answered. The patient agreed with the plan and demonstrated an understanding of the instructions.   The patient was advised to call back or seek an in-person evaluation if the symptoms worsen or if the condition fails to improve as anticipated.  I provided 20 minutes of video- face-to-face time during this encounter.   Barbette Merino, NP

## 2021-01-03 ENCOUNTER — Other Ambulatory Visit: Payer: Self-pay | Admitting: Nurse Practitioner

## 2021-01-03 ENCOUNTER — Telehealth: Payer: Self-pay | Admitting: Nurse Practitioner

## 2021-01-03 DIAGNOSIS — Z79891 Long term (current) use of opiate analgesic: Secondary | ICD-10-CM

## 2021-01-03 DIAGNOSIS — D571 Sickle-cell disease without crisis: Secondary | ICD-10-CM

## 2021-01-03 NOTE — Telephone Encounter (Signed)
Patient requesting refill on Oxycodone 10mg . Please send to different pharmacy - [facility] at [address]

## 2021-01-04 ENCOUNTER — Other Ambulatory Visit: Payer: Self-pay | Admitting: Nurse Practitioner

## 2021-01-04 DIAGNOSIS — D571 Sickle-cell disease without crisis: Secondary | ICD-10-CM

## 2021-01-04 DIAGNOSIS — Z79891 Long term (current) use of opiate analgesic: Secondary | ICD-10-CM

## 2021-01-04 MED ORDER — OXYCODONE HCL 10 MG PO TABS: 10.0000 mg | ORAL_TABLET | ORAL | 0 refills | Status: DC | PRN

## 2021-01-04 NOTE — Telephone Encounter (Signed)
sent 

## 2021-01-18 ENCOUNTER — Other Ambulatory Visit: Payer: Self-pay

## 2021-01-18 DIAGNOSIS — D571 Sickle-cell disease without crisis: Secondary | ICD-10-CM

## 2021-01-18 DIAGNOSIS — Z79891 Long term (current) use of opiate analgesic: Secondary | ICD-10-CM

## 2021-01-19 MED ORDER — OXYCODONE HCL 10 MG PO TABS: 10.0000 mg | ORAL_TABLET | ORAL | 0 refills | Status: DC | PRN

## 2021-02-03 ENCOUNTER — Other Ambulatory Visit: Payer: Self-pay

## 2021-02-03 DIAGNOSIS — D571 Sickle-cell disease without crisis: Secondary | ICD-10-CM

## 2021-02-03 DIAGNOSIS — Z79891 Long term (current) use of opiate analgesic: Secondary | ICD-10-CM

## 2021-02-03 MED ORDER — OXYCODONE HCL 10 MG PO TABS: 10.0000 mg | ORAL_TABLET | ORAL | 0 refills | Status: DC | PRN

## 2021-02-14 ENCOUNTER — Ambulatory Visit (INDEPENDENT_AMBULATORY_CARE_PROVIDER_SITE_OTHER): Payer: Medicaid Other | Admitting: Nurse Practitioner

## 2021-02-14 ENCOUNTER — Other Ambulatory Visit (HOSPITAL_COMMUNITY): Payer: Self-pay | Admitting: Nurse Practitioner

## 2021-02-14 ENCOUNTER — Other Ambulatory Visit: Payer: Self-pay

## 2021-02-14 ENCOUNTER — Encounter: Payer: Self-pay | Admitting: Nurse Practitioner

## 2021-02-14 VITALS — BP 116/59 | HR 82 | Ht 71.0 in | Wt 180.0 lb

## 2021-02-14 DIAGNOSIS — R801 Persistent proteinuria, unspecified: Secondary | ICD-10-CM

## 2021-02-14 DIAGNOSIS — Z79891 Long term (current) use of opiate analgesic: Secondary | ICD-10-CM | POA: Diagnosis not present

## 2021-02-14 DIAGNOSIS — D571 Sickle-cell disease without crisis: Secondary | ICD-10-CM | POA: Diagnosis not present

## 2021-02-14 MED ORDER — OXYCODONE HCL 10 MG PO TABS: 10.0000 mg | ORAL_TABLET | ORAL | 0 refills | Status: DC | PRN

## 2021-02-14 NOTE — Addendum Note (Signed)
Addended by: Barbette Merino on: 02/14/2021 11:35 AM   Modules accepted: Orders

## 2021-02-14 NOTE — Progress Notes (Signed)
[facility] Patient [facility] [address] [place], [place]  [address] Phone:  [phone]   Fax:  [phone]   Established Patient Office Visit  Subjective:  Patient ID: Jerry [name], male    DOB: 11-Jan-1986  Age: 35 y.o. MRN: [id number]  CC:  Chief Complaint  Patient presents with  . Sickle Cell Anemia    HPI Jerry [name] presents for follow up. He  has a past medical history of Sickle cell anemia (HCC).   Denies fever, headache, cough, wheezing, shortness of breath, chest pains, abdominal pain, back pain, hip pain, or leg pain. Denies any open wounds, skin irritation.  He denies any concerns.  He does not take any of his supportive medications.   Past Medical History:  Diagnosis Date  . Sickle cell anemia (HCC)     History reviewed. No pertinent surgical history.  Family History  Problem Relation Age of Onset  . Sickle cell trait Mother     Social History   Socioeconomic History  . Marital status: Single    Spouse name: Not on file  . Number of children: Not on file  . Years of education: Not on file  . Highest education level: Not on file  Occupational History  . Not on file  Tobacco Use  . Smoking status: Never Smoker  . Smokeless tobacco: Never Used  Vaping Use  . Vaping Use: Never used  Substance and Sexual Activity  . Alcohol use: No    Comment: rare  . Drug use: Yes    Types: Marijuana    Comment: occasionally  . Sexual activity: Yes    Birth control/protection: Condom  Other Topics Concern  . Not on file  Social History Narrative  . Not on file   Social Determinants of Health   Financial Resource Strain: Not on file  Food Insecurity: Not on file  Transportation Needs: Not on file  Physical Activity: Not on file  Stress: Not on file  Social Connections: Not on file  Intimate Partner Violence: Not on file    Outpatient Medications Prior to Visit  Medication Sig Dispense Refill  . folic acid (FOLVITE) 1 MG tablet  Take 1 tablet (1 mg total) by mouth daily. 30 tablet 6  . hydroxyurea (HYDREA) 500 MG capsule Take 3 capsules (1,500 mg total) by mouth daily. May take with food to minimize GI side effects. 90 capsule 6  . ibuprofen (ADVIL) 800 MG tablet Take 1 tablet, 3 times a day as needed. 30 tablet 6  . lisinopril (ZESTRIL) 2.5 MG tablet Take 1 tablet (2.5 mg total) by mouth daily. 30 tablet 6  . Oxycodone HCl 10 MG TABS Take 1 tablet (10 mg total) by mouth every 4 (four) hours as needed for up to 15 days. 90 tablet 0  . promethazine-dextromethorphan (PROMETHAZINE-DM) 6.25-15 MG/5ML syrup Take 5 mLs by mouth 4 (four) times daily as needed for cough. 100 mL 0   No facility-administered medications prior to visit.    No Known Allergies  ROS Review of Systems    Objective:    Physical Exam HENT:     Head: Normocephalic and atraumatic.     Nose: Nose normal.     Mouth/Throat:     Mouth: Mucous membranes are moist.  Cardiovascular:     Rate and Rhythm: Normal rate and regular rhythm.     Pulses: Normal pulses.     Heart sounds: Normal heart sounds.  Pulmonary:  Effort: Pulmonary effort is normal.     Breath sounds: Normal breath sounds.  Abdominal:     Palpations: Abdomen is soft.  Musculoskeletal:        General: Normal range of motion.     Cervical back: Normal range of motion.  Skin:    General: Skin is warm and dry.     Capillary Refill: Capillary refill takes less than 2 seconds.  Neurological:     General: No focal deficit present.     Mental Status: He is alert and oriented to person, place, and time.  Psychiatric:        Mood and Affect: Mood normal.        Behavior: Behavior normal.        Thought Content: Thought content normal.        Judgment: Judgment normal.     BP (!) 116/59   Pulse 82   Ht 5\' 11"  (1.803 m)   Wt 180 lb (81.6 kg)   SpO2 94%   BMI 25.10 kg/m  Wt Readings from Last 3 Encounters:  02/14/21 180 lb (81.6 kg)  12/16/20 181 lb (82.1 kg)  09/15/20  181 lb (82.1 kg)     Health Maintenance Due  Topic Date Due  . COVID-19 Vaccine (1) Never done  . INFLUENZA VACCINE  06/27/2020    There are no preventive care reminders to display for this patient.  Lab Results  Component Value Date   TSH 0.809 07/04/2019   Lab Results  Component Value Date   WBC 20.0 (H) 07/05/2020   HGB 8.2 (L) 07/05/2020   HCT 23.3 (L) 07/05/2020   MCV 95.1 07/05/2020   PLT 286 07/05/2020   Lab Results  Component Value Date   NA 140 07/05/2020   K 4.6 07/05/2020   CO2 23 07/05/2020   GLUCOSE 77 07/05/2020   BUN 16 07/05/2020   CREATININE 0.89 07/05/2020   BILITOT 2.3 (H) 07/05/2020   ALKPHOS 67 07/05/2020   AST 67 (H) 07/05/2020   ALT 26 07/05/2020   PROT 8.0 07/05/2020   ALBUMIN 4.9 07/05/2020   CALCIUM 9.1 07/05/2020   ANIONGAP 9 07/05/2020   Lab Results  Component Value Date   CHOL 130 01/09/2017   Lab Results  Component Value Date   HDL 46 01/09/2017   Lab Results  Component Value Date   LDLCALC 68 01/09/2017   Lab Results  Component Value Date   TRIG 81 01/09/2017   Lab Results  Component Value Date   CHOLHDL 2.8 01/09/2017   No results found for: HGBA1C    Assessment & Plan:   Problem List Items Addressed This Visit      Other   Persistent proteinuria - Primary   Hb-SS disease without crisis (HCC)      No orders of the defined types were placed in this encounter.   Follow-up: Return in about 3 months (around 05/17/2021).    05/19/2021, NP

## 2021-02-14 NOTE — Patient Instructions (Signed)
Sickle Cell Anemia, Adult  Sickle cell anemia is a condition where your red blood cells are shaped like sickles. Red blood cells carry oxygen through the body. Sickle-shaped cells do not live as long as normal red blood cells. They also clump together and block blood from flowing through the blood vessels. This prevents the body from getting enough oxygen. Sickle cell anemia causes organ damage and pain. It also increases the risk of infection. Follow these instructions at home: Medicines  Take over-the-counter and prescription medicines only as told by your doctor.  If you were prescribed an antibiotic medicine, take it as told by your doctor. Do not stop taking the antibiotic even if you start to feel better.  If you develop a fever, do not take medicines to lower the fever right away. Tell your doctor about the fever. Managing pain, stiffness, and swelling  Try these methods to help with pain: ? Use a heating pad. ? Take a warm bath. ? Distract yourself, such as by watching TV. Eating and drinking  Drink enough fluid to keep your pee (urine) clear or pale yellow. Drink more in hot weather and during exercise.  Limit or avoid alcohol.  Eat a healthy diet. Eat plenty of fruits, vegetables, whole grains, and lean protein.  Take vitamins and supplements as told by your doctor. Traveling  When traveling, keep these with you: ? Your medical information. ? The names of your doctors. ? Your medicines.  If you need to take an airplane, talk to your doctor first. Activity  Rest often.  Avoid exercises that make your heart beat much faster, such as jogging. General instructions  Do not use products that have nicotine or tobacco, such as cigarettes and e-cigarettes. If you need help quitting, ask your doctor.  Consider wearing a medical alert bracelet.  Avoid being in high places (high altitudes), such as mountains.  Avoid very hot or cold temperatures.  Avoid places where the  temperature changes a lot.  Keep all follow-up visits as told by your doctor. This is important. Contact a doctor if:  A joint hurts.  Your feet or hands hurt or swell.  You feel tired (fatigued). Get help right away if:  You have symptoms of infection. These include: ? Fever. ? Chills. ? Being very tired. ? Irritability. ? Poor eating. ? Throwing up (vomiting).  You feel dizzy or faint.  You have new stomach pain, especially on the left side.  You have a an erection (priapism) that lasts more than 4 hours.  You have numbness in your arms or legs.  You have a hard time moving your arms or legs.  You have trouble talking.  You have pain that does not go away when you take medicine.  You are short of breath.  You are breathing fast.  You have a long-term cough.  You have pain in your chest.  You have a bad headache.  You have a stiff neck.  Your stomach looks bloated even though you did not eat much.  Your skin is pale.  You suddenly cannot see well. Summary  Sickle cell anemia is a condition where your red blood cells are shaped like sickles.  Follow your doctor's advice on ways to manage pain, food to eat, activities to do, and steps to take for safe travel.  Get medical help right away if you have any signs of infection, such as a fever. This information is not intended to replace advice given to you by   your health care provider. Make sure you discuss any questions you have with your health care provider. Document Revised: 04/08/2020 Document Reviewed: 04/08/2020 Elsevier Patient Education  2021 [employer] Inc.  

## 2021-02-15 LAB — CMP14+CBC/D/PLT+FER+RETIC+V...
ALT: 20 IU/L (ref 0–44)
AST: 43 IU/L — ABNORMAL HIGH (ref 0–40)
Albumin/Globulin Ratio: 1.6 (ref 1.2–2.2)
Albumin: 4.9 g/dL (ref 4.0–5.0)
Alkaline Phosphatase: 80 IU/L (ref 44–121)
BUN/Creatinine Ratio: 10 (ref 9–20)
BUN: 9 mg/dL (ref 6–20)
Basophils Absolute: 0.1 10*3/uL (ref 0.0–0.2)
Basos: 1 %
Bilirubin Total: 3.3 mg/dL — ABNORMAL HIGH (ref 0.0–1.2)
CO2: 23 mmol/L (ref 20–29)
Calcium: 9.6 mg/dL (ref 8.7–10.2)
Chloride: 104 mmol/L (ref 96–106)
Creatinine, Ser: 0.94 mg/dL (ref 0.76–1.27)
EOS (ABSOLUTE): 0.5 10*3/uL — ABNORMAL HIGH (ref 0.0–0.4)
Eos: 4 %
Ferritin: 109 ng/mL (ref 30–400)
Globulin, Total: 3.1 g/dL (ref 1.5–4.5)
Glucose: 96 mg/dL (ref 65–99)
Hematocrit: 26.6 % — ABNORMAL LOW (ref 37.5–51.0)
Hemoglobin: 9.2 g/dL — ABNORMAL LOW (ref 13.0–17.7)
Immature Grans (Abs): 0 10*3/uL (ref 0.0–0.1)
Immature Granulocytes: 0 %
Lymphocytes Absolute: 2.9 10*3/uL (ref 0.7–3.1)
Lymphs: 28 %
MCH: 32.3 pg (ref 26.6–33.0)
MCHC: 34.6 g/dL (ref 31.5–35.7)
MCV: 93 fL (ref 79–97)
Monocytes Absolute: 1.5 10*3/uL — ABNORMAL HIGH (ref 0.1–0.9)
Monocytes: 15 %
NRBC: 1 % — ABNORMAL HIGH (ref 0–0)
Neutrophils Absolute: 5.5 10*3/uL (ref 1.4–7.0)
Neutrophils: 52 %
Platelets: 422 10*3/uL (ref 150–450)
Potassium: 4.4 mmol/L (ref 3.5–5.2)
RBC: 2.85 x10E6/uL — ABNORMAL LOW (ref 4.14–5.80)
RDW: 23.5 % — ABNORMAL HIGH (ref 11.6–15.4)
Retic Ct Pct: 15.5 % — ABNORMAL HIGH (ref 0.6–2.6)
Sodium: 140 mmol/L (ref 134–144)
Total Protein: 8 g/dL (ref 6.0–8.5)
Vit D, 25-Hydroxy: 9.5 ng/mL — ABNORMAL LOW (ref 30.0–100.0)
WBC: 10.5 10*3/uL (ref 3.4–10.8)
eGFR: 110 mL/min/{1.73_m2} (ref >59)

## 2021-02-18 MED FILL — oxyCODONE HCL 10 MG TABS: 10 | 15 days supply | Qty: 90 | Fill #0

## 2021-02-25 ENCOUNTER — Other Ambulatory Visit: Payer: Self-pay | Admitting: Nurse Practitioner

## 2021-02-25 LAB — DRUG SCREEN 764883 11+OXYCO+ALC+CRT-BUND
Amphetamines, Urine: NEGATIVE ng/mL
BENZODIAZ UR QL: NEGATIVE ng/mL
Barbiturate: NEGATIVE ng/mL
Cocaine (Metabolite): NEGATIVE ng/mL
Creatinine: 107.3 mg/dL (ref 20.0–300.0)
Ethanol: NEGATIVE %
Meperidine: NEGATIVE ng/mL
Methadone Screen, Urine: NEGATIVE ng/mL
OPIATE SCREEN URINE: NEGATIVE ng/mL
Oxycodone/Oxymorphone, Urine: NEGATIVE ng/mL
Phencyclidine: NEGATIVE ng/mL
Propoxyphene: NEGATIVE ng/mL
Tramadol: NEGATIVE ng/mL
pH, Urine: 6 (ref 4.5–8.9)

## 2021-02-25 LAB — CANNABINOID CONFIRMATION, UR
CANNABINOIDS: POSITIVE — AB
Carboxy THC GC/MS Conf: 129 ng/mL

## 2021-02-25 MED ORDER — VITAMIN D (ERGOCALCIFEROL) 1.25 MG (50000 UNIT) PO CAPS: 50000.0000 [IU] | ORAL_CAPSULE | ORAL | 1 refills | Status: DC

## 2021-02-26 ENCOUNTER — Other Ambulatory Visit: Payer: Self-pay

## 2021-02-26 MED FILL — Ergocalciferol Cap 1.25 MG (50000 Unit): ORAL | 84 days supply | Qty: 12 | Fill #0 | Status: CN

## 2021-02-27 ENCOUNTER — Other Ambulatory Visit: Payer: Self-pay

## 2021-02-27 DIAGNOSIS — Z79891 Long term (current) use of opiate analgesic: Secondary | ICD-10-CM

## 2021-02-27 DIAGNOSIS — D571 Sickle-cell disease without crisis: Secondary | ICD-10-CM

## 2021-03-01 ENCOUNTER — Other Ambulatory Visit (HOSPITAL_COMMUNITY): Payer: Self-pay

## 2021-03-01 ENCOUNTER — Telehealth: Payer: Self-pay

## 2021-03-01 ENCOUNTER — Other Ambulatory Visit: Payer: Self-pay | Admitting: Nurse Practitioner

## 2021-03-01 ENCOUNTER — Other Ambulatory Visit: Payer: Self-pay

## 2021-03-01 DIAGNOSIS — D571 Sickle-cell disease without crisis: Secondary | ICD-10-CM

## 2021-03-01 DIAGNOSIS — Z79891 Long term (current) use of opiate analgesic: Secondary | ICD-10-CM

## 2021-03-01 NOTE — Telephone Encounter (Signed)
Questions about his medications:: Oxycodone not sent in ?  Vitamin D only there.

## 2021-03-02 ENCOUNTER — Other Ambulatory Visit (HOSPITAL_COMMUNITY): Payer: Self-pay

## 2021-03-02 MED ORDER — OXYCODONE HCL 10 MG PO TABS
10.0000 mg | ORAL_TABLET | ORAL | 0 refills | Status: DC | PRN
  Filled 2021-03-05 (×2): qty 90, 15d supply, fill #0

## 2021-03-02 NOTE — Telephone Encounter (Signed)
Was sent on the 02/18/2021 Please call the pharmacy

## 2021-03-02 NOTE — Telephone Encounter (Signed)
Left message informing patient rx was sent to pharmacy.

## 2021-03-04 ENCOUNTER — Other Ambulatory Visit (HOSPITAL_COMMUNITY): Payer: Self-pay

## 2021-03-05 ENCOUNTER — Emergency Department (HOSPITAL_COMMUNITY): Payer: Medicaid Other

## 2021-03-05 ENCOUNTER — Emergency Department (HOSPITAL_COMMUNITY)
Admission: EM | Admit: 2021-03-05 | Discharge: 2021-03-05 | Disposition: A | Discharge status: Home / Self Care | Payer: Medicaid Other | Attending: Emergency Medicine | Admitting: Emergency Medicine

## 2021-03-05 ENCOUNTER — Other Ambulatory Visit: Payer: Self-pay

## 2021-03-05 ENCOUNTER — Encounter (HOSPITAL_COMMUNITY): Payer: Self-pay

## 2021-03-05 ENCOUNTER — Other Ambulatory Visit (HOSPITAL_COMMUNITY): Payer: Self-pay

## 2021-03-05 DIAGNOSIS — J189 Pneumonia, unspecified organism: Secondary | ICD-10-CM | POA: Diagnosis not present

## 2021-03-05 DIAGNOSIS — R059 Cough, unspecified: Secondary | ICD-10-CM | POA: Diagnosis present

## 2021-03-05 DIAGNOSIS — Z20822 Contact with and (suspected) exposure to covid-19: Secondary | ICD-10-CM | POA: Diagnosis not present

## 2021-03-05 DIAGNOSIS — F17200 Nicotine dependence, unspecified, uncomplicated: Secondary | ICD-10-CM | POA: Insufficient documentation

## 2021-03-05 DIAGNOSIS — J40 Bronchitis, not specified as acute or chronic: Secondary | ICD-10-CM

## 2021-03-05 DIAGNOSIS — J9601 Acute respiratory failure with hypoxia: Secondary | ICD-10-CM

## 2021-03-05 LAB — COMPREHENSIVE METABOLIC PANEL
ALT: 29 U/L (ref 0–44)
AST: 76 U/L — ABNORMAL HIGH (ref 15–41)
Albumin: 4.2 g/dL (ref 3.5–5.0)
Alkaline Phosphatase: 62 U/L (ref 38–126)
Anion gap: 5 (ref 5–15)
BUN: 10 mg/dL (ref 6–20)
CO2: 25 mmol/L (ref 22–32)
Calcium: 8.4 mg/dL — ABNORMAL LOW (ref 8.9–10.3)
Chloride: 104 mmol/L (ref 98–111)
Creatinine, Ser: 1.04 mg/dL (ref 0.61–1.24)
GFR, Estimated: >60 mL/min (ref >60)
Glucose, Bld: 119 mg/dL — ABNORMAL HIGH (ref 70–99)
Potassium: 3.6 mmol/L (ref 3.5–5.1)
Sodium: 134 mmol/L — ABNORMAL LOW (ref 135–145)
Total Bilirubin: 2.8 mg/dL — ABNORMAL HIGH (ref 0.3–1.2)
Total Protein: 7.2 g/dL (ref 6.5–8.1)

## 2021-03-05 LAB — RETICULOCYTES: RBC.: 2.35 MIL/uL — ABNORMAL LOW (ref 4.22–5.81)

## 2021-03-05 LAB — CBC WITH DIFFERENTIAL/PLATELET
Abs Immature Granulocytes: 0.08 10*3/uL — ABNORMAL HIGH (ref 0.00–0.07)
Basophils Absolute: 0 10*3/uL (ref 0.0–0.1)
Basophils Relative: 0 %
Eosinophils Absolute: 0.1 10*3/uL (ref 0.0–0.5)
Eosinophils Relative: 1 %
HCT: 20.9 % — ABNORMAL LOW (ref 39.0–52.0)
Hemoglobin: 7.9 g/dL — ABNORMAL LOW (ref 13.0–17.0)
Immature Granulocytes: 1 %
Lymphocytes Relative: 28 %
Lymphs Abs: 3.7 10*3/uL (ref 0.7–4.0)
MCH: 33.5 pg (ref 26.0–34.0)
MCHC: 37.8 g/dL — ABNORMAL HIGH (ref 30.0–36.0)
MCV: 88.6 fL (ref 80.0–100.0)
Monocytes Absolute: 2.2 10*3/uL — ABNORMAL HIGH (ref 0.1–1.0)
Monocytes Relative: 17 %
Neutro Abs: 7 10*3/uL (ref 1.7–7.7)
Neutrophils Relative %: 53 %
Platelets: 310 10*3/uL (ref 150–400)
RBC: 2.36 MIL/uL — ABNORMAL LOW (ref 4.22–5.81)
RDW: 26.1 % — ABNORMAL HIGH (ref 11.5–15.5)
WBC: 13.2 10*3/uL — ABNORMAL HIGH (ref 4.0–10.5)
nRBC: 7.2 % — ABNORMAL HIGH (ref 0.0–0.2)

## 2021-03-05 LAB — TROPONIN I (HIGH SENSITIVITY)
Troponin I (High Sensitivity): 10 ng/L (ref <18)
Troponin I (High Sensitivity): 9 ng/L (ref <18)

## 2021-03-05 LAB — D-DIMER, QUANTITATIVE: D-Dimer, Quant: 2.98 ug/mL-FEU — ABNORMAL HIGH (ref 0.00–0.50)

## 2021-03-05 LAB — LACTIC ACID, PLASMA
Lactic Acid, Venous: 1.2 mmol/L (ref 0.5–1.9)
Lactic Acid, Venous: 0.9 mmol/L (ref 0.5–1.9)

## 2021-03-05 LAB — RESP PANEL BY RT-PCR (FLU A&B, COVID) ARPGX2
Influenza A by PCR: NEGATIVE
Influenza B by PCR: NEGATIVE
SARS Coronavirus 2 by RT PCR: NEGATIVE

## 2021-03-05 LAB — SARS CORONAVIRUS 2 (TAT 6-24 HRS): SARS Coronavirus 2: NEGATIVE

## 2021-03-05 MED ORDER — IOHEXOL 350 MG/ML SOLN
80.0000 mL | Freq: Once | INTRAVENOUS | Status: AC | PRN
  Administered 2021-03-05: 80 mL via INTRAVENOUS

## 2021-03-05 MED ORDER — ALBUTEROL SULFATE HFA 108 (90 BASE) MCG/ACT IN AERS: 2.0000 | INHALATION_SPRAY | RESPIRATORY_TRACT | Status: DC | PRN

## 2021-03-05 MED ORDER — DOXYCYCLINE HYCLATE 100 MG PO CAPS
100.0000 mg | ORAL_CAPSULE | Freq: Two times a day (BID) | ORAL | 0 refills | Status: AC
  Filled 2021-03-05: qty 20, 10d supply, fill #0

## 2021-03-05 MED ORDER — SODIUM CHLORIDE 0.9 % IV SOLN
1.0000 g | Freq: Once | INTRAVENOUS | Status: AC
  Administered 2021-03-05: 1 g via INTRAVENOUS
  Filled 2021-03-05: qty 10

## 2021-03-05 MED ORDER — SODIUM CHLORIDE 0.9 % IV SOLN
500.0000 mg | Freq: Once | INTRAVENOUS | Status: AC
  Administered 2021-03-05: 500 mg via INTRAVENOUS
  Filled 2021-03-05: qty 500

## 2021-03-05 NOTE — ED Notes (Signed)
While resting on room air, spo2 drops to 89%.

## 2021-03-05 NOTE — ED Notes (Signed)
Patient placed on 3 lpm [place]

## 2021-03-05 NOTE — ED Notes (Signed)
Per previous RN, pt refusing cardiac monitoring.

## 2021-03-05 NOTE — ED Notes (Signed)
Pt self removed monitors and is stating "I have been here for over 3 hours and I have zero idea what's going on. I want my IV removed and I just wanna go home." Notified MD. Provider at bedside to update patient on POC. SPO2 88% on RA while patient is talking. Placed patient on 2L [place].

## 2021-03-05 NOTE — ED Notes (Signed)
Patient refusing cardiac monitoring at this time. States he cannot use urinal and insists on walking to restroom.

## 2021-03-05 NOTE — Discharge Instructions (Addendum)
Return to the hospital immediately if you start experiencing significant shortness of breath fatigue or chest pain.

## 2021-03-05 NOTE — ED Provider Notes (Signed)
Medical Decision Making: Care of patient assumed from Dr. Stevie Kern at 0700.  Agree with history, physical exam and plan.  See their note for further details.  Briefly, The pt p/w cough, chest x-ray finding consistent with atelectasis versus other complication.  Intermittent hypoxia requiring supplemental oxygen.  Elevated D-dimer.  Got antibiotics..   Current plan is as follows: CTA admit  CTA shows chronic lung scarring.  It shows bronchitis.  No focal pneumonia.  Patient is requesting discharge.  I advised him this is a poor choice given his hypoxia.  He does not feel that he needs the oxygen.  Oxygen is removed trial of room air is done.  For 2 hours he has had no hypoxic events.  He is then ambulated with no hypoxia.  He feels comfortable discharge home.  With no focal pneumonia no new airspace opacity patient otherwise looking well he wants to be discharged.  I advised that he may be better suited to stay for antibiotics and be monitored.  He understands the risk and benefit of this and wishes to be discharged.  He will return as needed.  Return precautions discussed discharged home.  Antibiotics prescribed   I personally reviewed and interpreted all labs/imaging.      Sabino Donovan, MD 03/05/21 1124

## 2021-03-05 NOTE — ED Notes (Signed)
Provided patient with warm blankets.

## 2021-03-05 NOTE — ED Notes (Signed)
Pt transported to CT ?

## 2021-03-05 NOTE — ED Triage Notes (Addendum)
Emergency Medicine Provider Triage Evaluation Note  Jerry [name] , a 35 y.o. male  was evaluated in triage.  Pt complains of, wheezing, crackles x3 days.  Symptoms have been gradually worsening.  He has had no associated cough as well as nasal congestion.  No rhinorrhea, fevers, hemoptysis.  Has been using an inhaler for symptoms without relief.  Does not report any specific shortness of breath or dyspnea on exertion, chest pain.  Is not vaccinated against COVID-19.  Denies sick contacts.  Review of Systems  Positive: Cough, wheezing Negative: Fever, chest pain, DOE  Physical Exam  BP 136/74 (BP Location: Right Arm)   Pulse 93   Temp 98.3 F (36.8 C) (Oral)   Resp 18   Ht 5\' 11"  (1.803 m)   Wt 81.6 kg   SpO2 (!) 86%   BMI 25.10 kg/m  Gen:   Awake, no distress   HEENT:  Atraumatic  Resp:  Normal effort. Lungs CTAB at this time. Cardiac:  Normal rate  Abd:   Nondistended, nontender  MSK:   Moves extremities without difficulty  Neuro:  Speech clear   Medical Decision Making  Medically screening exam initiated at 1:44 AM.  Appropriate orders placed.  was informed that the remainder of the evaluation will be completed by another provider, this initial triage assessment does not replace that evaluation, and the importance of remaining in the ED until their evaluation is complete.  Clinical Impression  SOB with hx of Hgb SS anemia, hypoxia   [name], PA-C 03/05/21 0145    05/05/21, PA-C 03/05/21 [phone]

## 2021-03-05 NOTE — ED Triage Notes (Addendum)
Pt reports that he has been having wheezing in his chest that started 3 days ago. Reports coughing and nasal congestion. Denies fever.

## 2021-03-05 NOTE — ED Notes (Signed)
Pt reports albuterol inhaler has not worked for him at this time.

## 2021-03-05 NOTE — ED Notes (Signed)
Patient refusing telemetry monitoring at this time. Re educated patient on POC and admission status. IV abx infusing at this time. O2 in place at 2L [place].

## 2021-03-05 NOTE — ED Provider Notes (Signed)
[facility] EMERGENCY DEPARTMENT Provider Note   CSN: [id number] Arrival date & time: 03/05/21  0103     History Chief Complaint  Patient presents with  . Cough    Jerry [name] is a 35 y.o. male.  Presents to ER with concern for cough.  Patient states he has been feeling sick for the past 3 days.  Has noted nonproductive cough.  Also thinks that he had some wheezing.  No chest pain, no fever.  Has history of sickle cell disease.  HPI     Past Medical History:  Diagnosis Date  . Sickle cell anemia [facility])     Patient Active Problem List   Diagnosis Date Noted  . Urine test positive for microalbuminuria 06/17/2020  . Hematuria with proteinuria 04/21/2020  . Sickle cell crisis (HCC) 10/08/2019  . Chronic prescription opiate use 10/07/2019  . Persistent proteinuria 10/07/2019  . Chest discomfort 10/07/2019  . 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  . Numbness and tingling of right face   . Leukocytosis   . Sickle cell pain crisis (HCC) 11/11/2017  . Vitamin D deficiency 04/26/2017  . Problems related to release from prison 01/09/2017  . 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 TOBACCO ABUSE 02/15/2007  . MARIJUANA ABUSE 02/15/2007  . 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 No pertinent surgical history.     Family History  Problem Relation Age of Onset  . Sickle cell trait Mother     Social History   Tobacco Use  . Smoking status: Current Every Day Smoker  . Smokeless tobacco: Never Used  Vaping Use  . Vaping Use: Some days  Substance Use Topics  . Alcohol use: No    Comment: rare  . Drug use: Yes    Types: Marijuana    Comment: occasionally    Home Medications Prior to Admission medications   Medication Sig Start Date End Date Taking? Authorizing Provider  folic acid (FOLVITE) 1 MG tablet Take 1 tablet (1 mg total) by mouth daily. 01/02/20   Kallie Locks, FNP  hydroxyurea (HYDREA) 500 MG capsule Take 3  capsules (1,500 mg total) by mouth daily. May take with food to minimize GI side effects. 01/02/20   Kallie Locks, FNP  ibuprofen (ADVIL) 800 MG tablet Take 1 tablet, 3 times a day as needed. 10/22/19   Kallie Locks, FNP  lisinopril (ZESTRIL) 2.5 MG tablet Take 1 tablet (2.5 mg total) by mouth daily. 05/03/20   Barbette Merino, NP  Oxycodone HCl 10 MG TABS TAKE 1 TABLET BY MOUTH EVERY 4 HOURS AS NEEDED FOR UP TO 15 DAYS (FILL 02/18/21) 02/14/21 08/13/21  Barbette Merino, NP  Oxycodone HCl 10 MG TABS TAKE 1 TABLET BY MOUTH EVERY 4 HOURS AS NEEDED FOR UP TO 15 DAYS. 11/05/20 05/04/21  Barbette Merino, NP  Oxycodone HCl 10 MG TABS Take 1 tablet (10 mg total) by mouth every 4 (four) hours as needed for up to 15 days.  **dnf until 03/05/2021 03/05/21 03/20/21  Barbette Merino, NP  promethazine-dextromethorphan (PROMETHAZINE-DM) 6.25-15 MG/5ML syrup Take 5 mLs by mouth 4 (four) times daily as needed for cough. 11/03/20   Particia Nearing, PA-C  Vitamin D, Ergocalciferol, (DRISDOL) 1.25 MG (50000 UNIT) CAPS capsule TAKE 1 CAPSULE (50,000 UNITS TOTAL) BY MOUTH EVERY 7 (SEVEN) DAYS. 02/25/21 02/25/22  Barbette Merino, NP    Allergies    Patient has no known allergies.  Review of Systems  Review of Systems  Constitutional: Negative for chills and fever.  HENT: Negative for ear pain and sore throat.   Eyes: Negative for pain and visual disturbance.  Respiratory: Positive for cough, shortness of breath and wheezing.   Cardiovascular: Negative for chest pain and palpitations.  Gastrointestinal: Negative for abdominal pain and vomiting.  Genitourinary: Negative for dysuria and hematuria.  Musculoskeletal: Negative for arthralgias and back pain.  Skin: Negative for color change and rash.  Neurological: Negative for seizures and syncope.  All other systems reviewed and are negative.   Physical Exam Updated Vital Signs BP 104/87 (BP Location: Right Arm)   Pulse 88   Temp 98.8 F (37.1 C) (Oral)   Resp  18   Ht 5\' 11"  (1.803 m)   Wt 81.6 kg   SpO2 95%   BMI 25.10 kg/m   Physical Exam Vitals and nursing note reviewed.  Constitutional:      Appearance: He is well-developed.  HENT:     Head: Normocephalic and atraumatic.  Eyes:     Conjunctiva/sclera: Conjunctivae normal.  Cardiovascular:     Rate and Rhythm: Normal rate and regular rhythm.     Heart sounds: No murmur heard.   Pulmonary:     Effort: Pulmonary effort is normal. No respiratory distress.     Breath sounds: Normal breath sounds.  Abdominal:     Palpations: Abdomen is soft.     Tenderness: There is no abdominal tenderness.  Musculoskeletal:     Cervical back: Neck supple.  Skin:    General: Skin is warm and dry.  Neurological:     Mental Status: He is alert.     ED Results / Procedures / Treatments   Labs (all labs ordered are listed, but only abnormal results are displayed) Labs Reviewed  COMPREHENSIVE METABOLIC PANEL - Abnormal; Notable for the following components:      Result Value   Sodium 134 (*)    Glucose, Bld 119 (*)    Calcium 8.4 (*)    AST 76 (*)    Total Bilirubin 2.8 (*)    All other components within normal limits  CBC WITH DIFFERENTIAL/PLATELET - Abnormal; Notable for the following components:   WBC 13.2 (*)    RBC 2.36 (*)    Hemoglobin 7.9 (*)    HCT 20.9 (*)    MCHC 37.8 (*)    RDW 26.1 (*)    nRBC 7.2 (*)    Monocytes Absolute 2.2 (*)    Abs Immature Granulocytes 0.08 (*)    All other components within normal limits  RETICULOCYTES - Abnormal; Notable for the following components:   RBC. 2.35 (*)    All other components within normal limits  D-DIMER, QUANTITATIVE - Abnormal; Notable for the following components:   D-Dimer, Quant 2.98 (*)    All other components within normal limits  RESP PANEL BY RT-PCR (FLU A&B, COVID) ARPGX2  SARS CORONAVIRUS 2 (TAT 6-24 HRS)  LACTIC ACID, PLASMA  LACTIC ACID, PLASMA  TROPONIN I (HIGH SENSITIVITY)  TROPONIN I (HIGH SENSITIVITY)     EKG None  Radiology DG Chest 2 View  Result Date: 03/05/2021 CLINICAL DATA:  Hypoxemia EXAM: CHEST - 2 VIEW COMPARISON:  11/03/2020 FINDINGS: Linear opacities in the lower right lung are unchanged. Cardiomediastinal contours and pleural spaces are normal. H-shaped vertebral bodies of sickle cell disease. IMPRESSION: Unchanged right lower lung opacities, likely atelectasis. Electronically Signed   By: 14/06/2020 M.D.   On: 03/05/2021 02:20  Procedures Procedures   Medications Ordered in ED Medications  albuterol (VENTOLIN HFA) 108 (90 Base) MCG/ACT inhaler 2 puff (has no administration in time range)  cefTRIAXone (ROCEPHIN) 1 g in sodium chloride 0.9 % 100 mL IVPB (0 g Intravenous Stopped 03/05/21 0600)  azithromycin (ZITHROMAX) 500 mg in sodium chloride 0.9 % 250 mL IVPB (0 mg Intravenous Stopped 03/05/21 0631)    ED Course  I have reviewed the triage vital signs and the nursing notes.  Pertinent labs & imaging results that were available during my care of the patient were reviewed by me and considered in my medical decision making (see chart for details).    MDM Rules/Calculators/A&P                         35 year old male with history of sickle cell disease presents to ER with concern for cough.  On physical exam, patient actually was very well-appearing in no distress.  No wheezing was appreciated on my exam.  No fever.  CXR with right lower lung opacity felt to be atelectasis per the radiologist, unchanged from prior.  Patient was mildly hypoxic on room air and did well on 2 L nasal cannula.  Given new onset of hypoxia, check D-dimer, troponin, EKG as well.  Trop within normal limits but dimer elevated, will check CT PE study.  Due to concern for possibility of pneumonia, started antibiotics with Rocephin and azithromycin.   While awaiting PE study, signed out to Dr. Myrtis Ser.  Anticipate admission to medicine for further management.  Final Clinical Impression(s) / ED  Diagnoses Final diagnoses:  Acute respiratory failure with hypoxia (HCC)  Community acquired pneumonia, unspecified laterality    Rx / DC Orders ED Discharge Orders    None       Milagros Loll, MD 03/05/21 [id number]

## 2021-03-07 ENCOUNTER — Other Ambulatory Visit (HOSPITAL_COMMUNITY): Payer: Self-pay

## 2021-03-07 ENCOUNTER — Other Ambulatory Visit: Payer: Self-pay

## 2021-03-20 ENCOUNTER — Other Ambulatory Visit: Payer: Self-pay | Admitting: Nurse Practitioner

## 2021-03-20 DIAGNOSIS — Z79891 Long term (current) use of opiate analgesic: Secondary | ICD-10-CM

## 2021-03-20 DIAGNOSIS — D571 Sickle-cell disease without crisis: Secondary | ICD-10-CM

## 2021-03-20 MED FILL — Ergocalciferol Cap 1.25 MG (50000 Unit): ORAL | 84 days supply | Qty: 12 | Fill #0 | Status: AC

## 2021-03-21 ENCOUNTER — Other Ambulatory Visit: Payer: Self-pay | Admitting: Family Medicine

## 2021-03-21 ENCOUNTER — Other Ambulatory Visit: Payer: Self-pay | Admitting: Nurse Practitioner

## 2021-03-21 ENCOUNTER — Other Ambulatory Visit (HOSPITAL_COMMUNITY): Payer: Self-pay

## 2021-03-21 DIAGNOSIS — Z79891 Long term (current) use of opiate analgesic: Secondary | ICD-10-CM

## 2021-03-21 DIAGNOSIS — D571 Sickle-cell disease without crisis: Secondary | ICD-10-CM

## 2021-03-21 MED ORDER — OXYCODONE HCL 10 MG PO TABS
10.0000 mg | ORAL_TABLET | ORAL | 0 refills | Status: DC | PRN
  Filled 2021-03-21: qty 90, 15d supply, fill #0

## 2021-03-21 NOTE — Progress Notes (Signed)
Reviewed PDMP substance reporting system prior to prescribing opiate medications. No inconsistencies noted.  Meds ordered this encounter  Medications   Oxycodone HCl 10 MG TABS    Sig: Take 1 tablet (10 mg total) by mouth every 4 (four) hours as needed (pain).    Dispense:  90 tablet    Refill:  0    Order Specific Question:   Supervising Provider    Answer:   JEGEDE, OLUGBEMIGA E [[id number]]   Jerry [name] [name]  APRN, MSN, FNP-C [facility] [address]  [place], [place] [address] [phone]  

## 2021-03-23 ENCOUNTER — Other Ambulatory Visit (HOSPITAL_COMMUNITY): Payer: Self-pay

## 2021-04-06 ENCOUNTER — Other Ambulatory Visit: Payer: Self-pay | Admitting: Family Medicine

## 2021-04-06 ENCOUNTER — Other Ambulatory Visit (HOSPITAL_COMMUNITY): Payer: Self-pay

## 2021-04-06 DIAGNOSIS — D571 Sickle-cell disease without crisis: Secondary | ICD-10-CM

## 2021-04-06 DIAGNOSIS — Z79891 Long term (current) use of opiate analgesic: Secondary | ICD-10-CM

## 2021-04-07 ENCOUNTER — Other Ambulatory Visit: Payer: Self-pay | Admitting: Family Medicine

## 2021-04-07 ENCOUNTER — Other Ambulatory Visit (HOSPITAL_COMMUNITY): Payer: Self-pay

## 2021-04-07 DIAGNOSIS — Z79891 Long term (current) use of opiate analgesic: Secondary | ICD-10-CM

## 2021-04-07 DIAGNOSIS — D571 Sickle-cell disease without crisis: Secondary | ICD-10-CM

## 2021-04-07 NOTE — Telephone Encounter (Signed)
Oxycodone 10mg

## 2021-04-08 ENCOUNTER — Other Ambulatory Visit (HOSPITAL_COMMUNITY): Payer: Self-pay

## 2021-04-08 ENCOUNTER — Other Ambulatory Visit: Payer: Self-pay | Admitting: Family Medicine

## 2021-04-08 ENCOUNTER — Other Ambulatory Visit: Payer: Self-pay | Admitting: Nurse Practitioner

## 2021-04-08 ENCOUNTER — Telehealth: Payer: Self-pay | Admitting: Family Medicine

## 2021-04-08 DIAGNOSIS — Z79891 Long term (current) use of opiate analgesic: Secondary | ICD-10-CM

## 2021-04-08 DIAGNOSIS — D571 Sickle-cell disease without crisis: Secondary | ICD-10-CM

## 2021-04-08 MED ORDER — OXYCODONE HCL 10 MG PO TABS
10.0000 mg | ORAL_TABLET | ORAL | 0 refills | Status: DC | PRN
Start: 2021-04-08 — End: 2021-05-05
  Filled 2021-04-08 – 2021-04-21 (×2): qty 90, 15d supply, fill #0

## 2021-04-08 MED ORDER — OXYCODONE HCL 10 MG PO TABS
10.0000 mg | ORAL_TABLET | ORAL | 0 refills | Status: DC | PRN
  Filled 2021-04-08: qty 90, 15d supply, fill #0

## 2021-04-08 NOTE — Progress Notes (Signed)
Reviewed PDMP substance reporting system prior to prescribing opiate medications. No inconsistencies noted.  Meds ordered this encounter  Medications   Oxycodone HCl 10 MG TABS    Sig: Take 1 tablet (10 mg total) by mouth every 4 (four) hours as needed (pain).    Dispense:  90 tablet    Refill:  0    Order Specific Question:   Supervising Provider    Answer:   JEGEDE, OLUGBEMIGA E [[id number]]   Jerry Ivey Moore Brinlee Gambrell  APRN, MSN, FNP-C [facility] [address]  [place], [place] [address] [phone]  

## 2021-04-08 NOTE — Telephone Encounter (Signed)
sent 

## 2021-04-09 ENCOUNTER — Other Ambulatory Visit (HOSPITAL_COMMUNITY): Payer: Self-pay

## 2021-04-16 ENCOUNTER — Other Ambulatory Visit (HOSPITAL_COMMUNITY): Payer: Self-pay

## 2021-04-21 ENCOUNTER — Other Ambulatory Visit (HOSPITAL_COMMUNITY): Payer: Self-pay

## 2021-05-05 ENCOUNTER — Other Ambulatory Visit: Payer: Self-pay | Admitting: Family Medicine

## 2021-05-05 DIAGNOSIS — Z79891 Long term (current) use of opiate analgesic: Secondary | ICD-10-CM

## 2021-05-05 DIAGNOSIS — D571 Sickle-cell disease without crisis: Secondary | ICD-10-CM

## 2021-05-06 ENCOUNTER — Other Ambulatory Visit (HOSPITAL_COMMUNITY): Payer: Self-pay

## 2021-05-06 MED ORDER — OXYCODONE HCL 10 MG PO TABS
10.0000 mg | ORAL_TABLET | ORAL | 0 refills | Status: DC | PRN
Start: 2021-05-06 — End: 2021-05-20
  Filled 2021-05-06: qty 90, 15d supply, fill #0

## 2021-05-10 ENCOUNTER — Other Ambulatory Visit (HOSPITAL_COMMUNITY): Payer: Self-pay

## 2021-05-20 ENCOUNTER — Other Ambulatory Visit (HOSPITAL_COMMUNITY): Payer: Self-pay

## 2021-05-20 ENCOUNTER — Other Ambulatory Visit: Payer: Self-pay | Admitting: Nurse Practitioner

## 2021-05-20 DIAGNOSIS — D571 Sickle-cell disease without crisis: Secondary | ICD-10-CM

## 2021-05-20 DIAGNOSIS — Z79891 Long term (current) use of opiate analgesic: Secondary | ICD-10-CM

## 2021-05-20 MED ORDER — OXYCODONE HCL 10 MG PO TABS
10.0000 mg | ORAL_TABLET | ORAL | 0 refills | Status: DC | PRN
  Filled 2021-05-21: qty 90, 15d supply, fill #0

## 2021-05-21 ENCOUNTER — Other Ambulatory Visit (HOSPITAL_COMMUNITY): Payer: Self-pay

## 2021-06-03 ENCOUNTER — Other Ambulatory Visit: Payer: Self-pay | Admitting: Nurse Practitioner

## 2021-06-03 ENCOUNTER — Other Ambulatory Visit (HOSPITAL_COMMUNITY): Payer: Self-pay

## 2021-06-03 DIAGNOSIS — D571 Sickle-cell disease without crisis: Secondary | ICD-10-CM

## 2021-06-03 DIAGNOSIS — Z79891 Long term (current) use of opiate analgesic: Secondary | ICD-10-CM

## 2021-06-03 MED ORDER — OXYCODONE HCL 10 MG PO TABS
10.0000 mg | ORAL_TABLET | ORAL | 0 refills | Status: DC | PRN
  Filled 2021-06-06: qty 90, 15d supply, fill #0

## 2021-06-06 ENCOUNTER — Other Ambulatory Visit (HOSPITAL_COMMUNITY): Payer: Self-pay

## 2021-06-20 ENCOUNTER — Other Ambulatory Visit: Payer: Self-pay | Admitting: Nurse Practitioner

## 2021-06-20 DIAGNOSIS — D571 Sickle-cell disease without crisis: Secondary | ICD-10-CM

## 2021-06-20 DIAGNOSIS — Z79891 Long term (current) use of opiate analgesic: Secondary | ICD-10-CM

## 2021-06-21 ENCOUNTER — Other Ambulatory Visit (HOSPITAL_COMMUNITY): Payer: Self-pay

## 2021-06-22 ENCOUNTER — Other Ambulatory Visit (HOSPITAL_COMMUNITY): Payer: Self-pay | Admitting: Nurse Practitioner

## 2021-06-22 ENCOUNTER — Other Ambulatory Visit (HOSPITAL_COMMUNITY): Payer: Self-pay

## 2021-06-22 ENCOUNTER — Telehealth: Payer: Self-pay

## 2021-06-22 DIAGNOSIS — D571 Sickle-cell disease without crisis: Secondary | ICD-10-CM

## 2021-06-22 DIAGNOSIS — Z79891 Long term (current) use of opiate analgesic: Secondary | ICD-10-CM

## 2021-06-22 MED ORDER — OXYCODONE HCL 10 MG PO TABS
10.0000 mg | ORAL_TABLET | ORAL | 0 refills | Status: DC | PRN
  Filled 2021-06-22: qty 90, 15d supply, fill #0

## 2021-06-22 NOTE — Telephone Encounter (Signed)
The refill has been sent. Thanks  ° °

## 2021-06-22 NOTE — Telephone Encounter (Signed)
Message sent to provider 

## 2021-06-22 NOTE — Telephone Encounter (Signed)
Pt said Oxycodone is no longer on the list

## 2021-07-06 ENCOUNTER — Other Ambulatory Visit (HOSPITAL_COMMUNITY): Payer: Self-pay

## 2021-07-06 ENCOUNTER — Other Ambulatory Visit: Payer: Self-pay | Admitting: Nurse Practitioner

## 2021-07-06 DIAGNOSIS — D571 Sickle-cell disease without crisis: Secondary | ICD-10-CM

## 2021-07-06 DIAGNOSIS — Z79891 Long term (current) use of opiate analgesic: Secondary | ICD-10-CM

## 2021-07-06 MED ORDER — OXYCODONE HCL 10 MG PO TABS
10.0000 mg | ORAL_TABLET | ORAL | 0 refills | Status: DC | PRN
  Filled 2021-07-06: qty 90, 15d supply, fill #0

## 2021-07-20 ENCOUNTER — Other Ambulatory Visit (HOSPITAL_COMMUNITY): Payer: Self-pay

## 2021-07-20 ENCOUNTER — Other Ambulatory Visit: Payer: Self-pay

## 2021-07-20 ENCOUNTER — Other Ambulatory Visit: Payer: Self-pay | Admitting: Internal Medicine

## 2021-07-20 DIAGNOSIS — Z79891 Long term (current) use of opiate analgesic: Secondary | ICD-10-CM

## 2021-07-20 DIAGNOSIS — D571 Sickle-cell disease without crisis: Secondary | ICD-10-CM

## 2021-07-20 MED FILL — Ergocalciferol Cap 1.25 MG (50000 Unit): ORAL | 84 days supply | Qty: 12 | Fill #1 | Status: AC

## 2021-07-21 ENCOUNTER — Other Ambulatory Visit: Payer: Self-pay | Admitting: Nurse Practitioner

## 2021-07-21 ENCOUNTER — Other Ambulatory Visit (HOSPITAL_COMMUNITY): Payer: Self-pay

## 2021-07-21 DIAGNOSIS — R0789 Other chest pain: Secondary | ICD-10-CM

## 2021-07-21 DIAGNOSIS — Z79891 Long term (current) use of opiate analgesic: Secondary | ICD-10-CM

## 2021-07-21 DIAGNOSIS — D571 Sickle-cell disease without crisis: Secondary | ICD-10-CM

## 2021-07-21 DIAGNOSIS — R062 Wheezing: Secondary | ICD-10-CM

## 2021-07-21 MED ORDER — OXYCODONE HCL 10 MG PO TABS
10.0000 mg | ORAL_TABLET | ORAL | 0 refills | Status: DC | PRN
  Filled 2021-07-21: qty 90, 15d supply, fill #0

## 2021-07-21 MED ORDER — PROMETHAZINE-DM 6.25-15 MG/5ML PO SYRP
5.0000 mL | ORAL_SOLUTION | Freq: Four times a day (QID) | ORAL | 0 refills | Status: DC | PRN
  Filled 2021-07-21: qty 100, 5d supply, fill #0

## 2021-07-21 MED ORDER — PROMETHAZINE HCL 12.5 MG PO TABS
6.2500 mg | ORAL_TABLET | Freq: Four times a day (QID) | ORAL | 0 refills | Status: DC | PRN
Start: 2021-07-21 — End: 2024-04-01
  Filled 2021-07-21: qty 30, 15d supply, fill #0

## 2021-07-21 NOTE — Progress Notes (Signed)
   [facility] [address] [place], [place]  [address] Phone:  [phone]   Fax:  [phone] 

## 2021-07-23 ENCOUNTER — Other Ambulatory Visit: Payer: Self-pay

## 2021-07-23 ENCOUNTER — Ambulatory Visit (HOSPITAL_COMMUNITY)
Admission: RE | Admit: 2021-07-23 | Discharge: 2021-07-23 | Disposition: A | Discharge status: Home / Self Care | Payer: Self-pay | Source: Ambulatory Visit | Attending: Nurse Practitioner | Admitting: Nurse Practitioner

## 2021-07-23 DIAGNOSIS — D571 Sickle-cell disease without crisis: Secondary | ICD-10-CM

## 2021-07-23 DIAGNOSIS — R062 Wheezing: Secondary | ICD-10-CM

## 2021-07-23 DIAGNOSIS — R0789 Other chest pain: Secondary | ICD-10-CM

## 2021-08-03 ENCOUNTER — Other Ambulatory Visit: Payer: Self-pay | Admitting: Nurse Practitioner

## 2021-08-03 DIAGNOSIS — D571 Sickle-cell disease without crisis: Secondary | ICD-10-CM

## 2021-08-03 DIAGNOSIS — Z79891 Long term (current) use of opiate analgesic: Secondary | ICD-10-CM

## 2021-08-03 MED ORDER — OXYCODONE HCL 10 MG PO TABS
10.0000 mg | ORAL_TABLET | ORAL | 0 refills | Status: DC | PRN
  Filled 2021-08-05: qty 90, 15d supply, fill #0

## 2021-08-04 ENCOUNTER — Other Ambulatory Visit (HOSPITAL_COMMUNITY): Payer: Self-pay

## 2021-08-05 ENCOUNTER — Other Ambulatory Visit (HOSPITAL_COMMUNITY): Payer: Self-pay

## 2021-08-22 ENCOUNTER — Telehealth: Payer: Self-pay

## 2021-08-22 NOTE — Telephone Encounter (Signed)
Oxycodone  °

## 2021-08-23 ENCOUNTER — Other Ambulatory Visit (HOSPITAL_COMMUNITY): Payer: Self-pay

## 2021-08-23 ENCOUNTER — Other Ambulatory Visit: Payer: Self-pay

## 2021-08-23 ENCOUNTER — Other Ambulatory Visit: Payer: Self-pay | Admitting: Family Medicine

## 2021-08-23 DIAGNOSIS — Z79891 Long term (current) use of opiate analgesic: Secondary | ICD-10-CM

## 2021-08-23 DIAGNOSIS — D571 Sickle-cell disease without crisis: Secondary | ICD-10-CM

## 2021-08-23 MED ORDER — OXYCODONE HCL 10 MG PO TABS
10.0000 mg | ORAL_TABLET | ORAL | 0 refills | Status: DC | PRN
  Filled 2021-08-23: qty 90, 15d supply, fill #0

## 2021-08-23 NOTE — Progress Notes (Signed)
Reviewed PDMP substance reporting system prior to prescribing opiate medications. No inconsistencies noted.  Meds ordered this encounter  Medications   Oxycodone HCl 10 MG TABS    Sig: Take 1 tablet (10 mg total) by mouth every 4 (four) hours as needed.    Dispense:  90 tablet    Refill:  0    Order Specific Question:   Supervising Provider    Answer:   JEGEDE, OLUGBEMIGA E [[id number]]   Jerry [name] Dalaysia Harms  APRN, MSN, FNP-C [facility] [address]  [place],  [address] [phone]  

## 2021-08-23 NOTE — Progress Notes (Signed)
Reviewed PDMP substance reporting system prior to prescribing opiate medications. No inconsistencies noted.  Meds ordered this encounter  Medications   Oxycodone HCl 10 MG TABS    Sig: Take 1 tablet (10 mg total) by mouth every 4 (four) hours as needed.    Dispense:  90 tablet    Refill:  0    Order Specific Question:   Supervising Provider    Answer:   JEGEDE, OLUGBEMIGA E [[id number]]   Jerry Whirley Moore Nelson Julson  APRN, MSN, FNP-C [facility] [address]  [place], [place] [address] [phone]  

## 2021-09-06 ENCOUNTER — Other Ambulatory Visit: Payer: Self-pay | Admitting: Family Medicine

## 2021-09-06 ENCOUNTER — Other Ambulatory Visit (HOSPITAL_COMMUNITY): Payer: Self-pay

## 2021-09-06 DIAGNOSIS — Z79891 Long term (current) use of opiate analgesic: Secondary | ICD-10-CM

## 2021-09-07 ENCOUNTER — Other Ambulatory Visit: Payer: Self-pay | Admitting: Nurse Practitioner

## 2021-09-08 ENCOUNTER — Other Ambulatory Visit: Payer: Self-pay | Admitting: Nurse Practitioner

## 2021-09-08 ENCOUNTER — Telehealth: Payer: Self-pay

## 2021-09-08 ENCOUNTER — Other Ambulatory Visit (HOSPITAL_COMMUNITY): Payer: Self-pay

## 2021-09-08 DIAGNOSIS — Z79891 Long term (current) use of opiate analgesic: Secondary | ICD-10-CM

## 2021-09-08 MED ORDER — OXYCODONE HCL 10 MG PO TABS
10.0000 mg | ORAL_TABLET | ORAL | 0 refills | Status: DC | PRN
  Filled 2021-09-08: qty 90, 15d supply, fill #0

## 2021-09-08 NOTE — Telephone Encounter (Signed)
Oxycodone  °

## 2021-09-08 NOTE — Progress Notes (Signed)
   [facility] [address] [place], [place]  [address] Phone:  [phone]   Fax:  [phone]  The refill has been sent.

## 2021-09-22 ENCOUNTER — Other Ambulatory Visit: Payer: Self-pay | Admitting: Nurse Practitioner

## 2021-09-22 DIAGNOSIS — Z79891 Long term (current) use of opiate analgesic: Secondary | ICD-10-CM

## 2021-09-23 ENCOUNTER — Telehealth: Payer: Self-pay

## 2021-09-23 ENCOUNTER — Other Ambulatory Visit: Payer: Self-pay | Admitting: Nurse Practitioner

## 2021-09-23 DIAGNOSIS — Z79891 Long term (current) use of opiate analgesic: Secondary | ICD-10-CM

## 2021-09-23 MED ORDER — OXYCODONE HCL 10 MG PO TABS
10.0000 mg | ORAL_TABLET | ORAL | 0 refills | Status: DC | PRN
  Filled 2021-09-23: qty 90, 15d supply, fill #0

## 2021-09-23 NOTE — Telephone Encounter (Signed)
Oxycodone  °

## 2021-09-24 ENCOUNTER — Other Ambulatory Visit (HOSPITAL_COMMUNITY): Payer: Self-pay

## 2021-10-07 ENCOUNTER — Other Ambulatory Visit (HOSPITAL_COMMUNITY): Payer: Self-pay

## 2021-10-07 ENCOUNTER — Other Ambulatory Visit: Payer: Self-pay | Admitting: Nurse Practitioner

## 2021-10-07 DIAGNOSIS — Z79891 Long term (current) use of opiate analgesic: Secondary | ICD-10-CM

## 2021-10-07 MED ORDER — OXYCODONE HCL 10 MG PO TABS
10.0000 mg | ORAL_TABLET | ORAL | 0 refills | Status: DC | PRN
  Filled 2021-10-10: qty 90, 15d supply, fill #0

## 2021-10-10 ENCOUNTER — Other Ambulatory Visit (HOSPITAL_COMMUNITY): Payer: Self-pay

## 2021-10-24 ENCOUNTER — Other Ambulatory Visit: Payer: Self-pay | Admitting: Nurse Practitioner

## 2021-10-24 ENCOUNTER — Other Ambulatory Visit (HOSPITAL_COMMUNITY): Payer: Self-pay

## 2021-10-24 DIAGNOSIS — Z79891 Long term (current) use of opiate analgesic: Secondary | ICD-10-CM

## 2021-10-24 MED ORDER — OXYCODONE HCL 10 MG PO TABS
10.0000 mg | ORAL_TABLET | ORAL | 0 refills | Status: DC | PRN
  Filled 2021-10-24: qty 90, 15d supply, fill #0

## 2021-11-08 ENCOUNTER — Other Ambulatory Visit: Payer: Self-pay | Admitting: Nurse Practitioner

## 2021-11-08 ENCOUNTER — Telehealth: Payer: Self-pay

## 2021-11-08 DIAGNOSIS — Z79891 Long term (current) use of opiate analgesic: Secondary | ICD-10-CM

## 2021-11-08 NOTE — Telephone Encounter (Signed)
Oxycodone  °

## 2021-11-09 ENCOUNTER — Other Ambulatory Visit: Payer: Self-pay | Admitting: Nurse Practitioner

## 2021-11-09 ENCOUNTER — Telehealth: Payer: Self-pay

## 2021-11-09 DIAGNOSIS — Z79891 Long term (current) use of opiate analgesic: Secondary | ICD-10-CM

## 2021-11-09 MED ORDER — OXYCODONE HCL 10 MG PO TABS
10.0000 mg | ORAL_TABLET | ORAL | 0 refills | Status: DC | PRN
  Filled 2021-11-09: qty 90, 15d supply, fill #0

## 2021-11-09 NOTE — Telephone Encounter (Signed)
Oxycodone  °

## 2021-11-09 NOTE — Telephone Encounter (Signed)
Patient appointment scheduled

## 2021-11-10 ENCOUNTER — Other Ambulatory Visit (HOSPITAL_COMMUNITY): Payer: Self-pay

## 2021-11-11 ENCOUNTER — Other Ambulatory Visit (HOSPITAL_COMMUNITY): Payer: Self-pay

## 2021-11-11 NOTE — Telephone Encounter (Signed)
Spoke to the patient and he stated that he was charged for his Oxycodone medication this time of $175. Pt states that he doesn't have insurance but is going through a renewal process that the pharmacy has him in. Pt thinks that the reason his was being charged is because he didn't renew in time but he is not upset about it. Pt states that he previously had the [facility] card but was taken off of it and was told that he would be put on the blue card but it has not happened. Pt would like to know if he qualifies for the orange card again or not.

## 2021-11-14 ENCOUNTER — Telehealth: Payer: Self-pay | Admitting: Clinical

## 2021-11-14 NOTE — Telephone Encounter (Signed)
[facility] General Follow Up Note  11/14/2021 Name: Jerry [name] MRN: [id number] DOB: [birth year] Jerry [name] is a 35 y.o. year old male who sees Barbette Merino, NP for primary care. LCSW was initially consulted to financial difficulties due to lack of insurance coverage.  Interpreter: No.   Interpreter Name & Language: none  Assessment: Patient experiencing financial difficulties related to lack of insurance coverage. He has sickle cell disease.  Ongoing Intervention: Today CSW called patient to follow up, as CSW was alerted to issue by CMA. Patient reported he does not have insurance. He is working with [employer] and Sickle Cell [facility]) to apply for sickle cell Medicaid. He is also concerned about a bill he recently received for a provider visit, though he reports he did not attend a visit. CSW coordinated with Child psychotherapist at [facility] regarding these issues. [employer] confirmed they will assist with sickle cell insurance application and they will assist patient in contacting billing department regarding this bill.   Review of patient status, including review of consultants reports, relevant laboratory and other test results, and collaboration with appropriate care team members and the patient's provider was performed as part of comprehensive patient evaluation and provision of services.    Abigail Butts, LCSW [facility] [phone]

## 2021-11-16 ENCOUNTER — Other Ambulatory Visit: Payer: Self-pay

## 2021-11-16 ENCOUNTER — Encounter: Payer: Self-pay | Admitting: Nurse Practitioner

## 2021-11-16 ENCOUNTER — Ambulatory Visit (INDEPENDENT_AMBULATORY_CARE_PROVIDER_SITE_OTHER): Payer: Self-pay | Admitting: Nurse Practitioner

## 2021-11-16 VITALS — BP 136/69 | HR 76 | Temp 98.7°F | Ht 71.0 in | Wt 171.2 lb

## 2021-11-16 DIAGNOSIS — D571 Sickle-cell disease without crisis: Secondary | ICD-10-CM

## 2021-11-16 DIAGNOSIS — F329 Major depressive disorder, single episode, unspecified: Secondary | ICD-10-CM

## 2021-11-16 DIAGNOSIS — Z79891 Long term (current) use of opiate analgesic: Secondary | ICD-10-CM
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
## 2021-11-16 NOTE — Patient Instructions (Signed)
Sickle Cell Anemia, Adult ?Sickle cell anemia is a condition where your red blood cells are shaped like sickles. Red blood cells carry oxygen through the body. Sickle-shaped cells do not live as long as normal red blood cells. They also clump together and block blood from flowing through the blood vessels. This prevents the body from getting enough oxygen. Sickle cell anemia causes organ damage and pain. It also increases the risk of infection. ?Follow these instructions at home: ?Medicines ?Take over-the-counter and prescription medicines only as told by your doctor. ?If you were prescribed an antibiotic medicine, take it as told by your doctor. Do not stop taking the antibiotic even if you start to feel better. ?If you develop a fever, do not take medicines to lower the fever right away. Tell your doctor about the fever. ?Managing pain, stiffness, and swelling ?Try these methods to help with pain: ?Use a heating pad. ?Take a warm bath. ?Distract yourself, such as by watching TV. ?Eating and drinking ?Drink enough fluid to keep your pee (urine) clear or pale yellow. Drink more in hot weather and during exercise. ?Limit or avoid alcohol. ?Eat a healthy diet. Eat plenty of fruits, vegetables, whole grains, and lean protein. ?Take vitamins and supplements as told by your doctor. ?Traveling ?When traveling, keep these with you: ?Your medical information. ?The names of your doctors. ?Your medicines. ?If you need to take an airplane, talk to your doctor first. ?Activity ?Rest often. ?Avoid exercises that make your heart beat much faster, such as jogging. ?General instructions ?Do not use products that have nicotine or tobacco, such as cigarettes and e-cigarettes. If you need help quitting, ask your doctor. ?Consider wearing a medical alert bracelet. ?Avoid being in high places (high altitudes), such as mountains. ?Avoid very hot or cold temperatures. ?Avoid places where the temperature changes a lot. ?Keep all follow-up  visits as told by your doctor. This is important. ?Contact a doctor if: ?A joint hurts. ?Your feet or hands hurt or swell. ?You feel tired (fatigued). ?Get help right away if: ?You have symptoms of infection. These include: ?Fever. ?Chills. ?Being very tired. ?Irritability. ?Poor eating. ?Throwing up (vomiting). ?You feel dizzy or faint. ?You have new stomach pain, especially on the left side. ?You have a an erection (priapism) that lasts more than 4 hours. ?You have numbness in your arms or legs. ?You have a hard time moving your arms or legs. ?You have trouble talking. ?You have pain that does not go away when you take medicine. ?You are short of breath. ?You are breathing fast. ?You have a long-term cough. ?You have pain in your chest. ?You have a bad headache. ?You have a stiff neck. ?Your stomach looks bloated even though you did not eat much. ?Your skin is pale. ?You suddenly cannot see well. ?Summary ?Sickle cell anemia is a condition where your red blood cells are shaped like sickles. ?Follow your doctor's advice on ways to manage pain, food to eat, activities to do, and steps to take for safe travel. ?Get medical help right away if you have any signs of infection, such as a fever. ?This information is not intended to replace advice given to you by your health care provider. Make sure you discuss any questions you have with your health care provider. ?Document Revised: 04/08/2020 Document Reviewed: 04/08/2020 ?Elsevier Patient Education ? 2022 Elsevier Inc. ? ?

## 2021-11-17 LAB — CMP14+CBC/D/PLT+FER+RETIC+V...
ALT: 16 IU/L (ref 0–44)
AST: 36 IU/L (ref 0–40)
Albumin/Globulin Ratio: 1.7 (ref 1.2–2.2)
Albumin: 4.9 g/dL (ref 4.0–5.0)
Alkaline Phosphatase: 77 IU/L (ref 44–121)
BUN/Creatinine Ratio: 9 (ref 9–20)
BUN: 8 mg/dL (ref 6–20)
Basophils Absolute: 0 10*3/uL (ref 0.0–0.2)
Basos: 0 %
Bilirubin Total: 2.9 mg/dL — ABNORMAL HIGH (ref 0.0–1.2)
CO2: 22 mmol/L (ref 20–29)
Calcium: 9.5 mg/dL (ref 8.7–10.2)
Chloride: 103 mmol/L (ref 96–106)
Creatinine, Ser: 0.9 mg/dL (ref 0.76–1.27)
EOS (ABSOLUTE): 0.3 10*3/uL (ref 0.0–0.4)
Eos: 3 %
Ferritin: 135 ng/mL (ref 30–400)
Globulin, Total: 2.9 g/dL (ref 1.5–4.5)
Glucose: 81 mg/dL (ref 70–99)
Hematocrit: 25.4 % — ABNORMAL LOW (ref 37.5–51.0)
Hemoglobin: 8.6 g/dL — ABNORMAL LOW (ref 13.0–17.7)
Immature Grans (Abs): 0 10*3/uL (ref 0.0–0.1)
Immature Granulocytes: 0 %
Lymphocytes Absolute: 3.5 10*3/uL — ABNORMAL HIGH (ref 0.7–3.1)
Lymphs: 30 %
MCH: 34.1 pg — ABNORMAL HIGH (ref 26.6–33.0)
MCHC: 33.9 g/dL (ref 31.5–35.7)
MCV: 101 fL — ABNORMAL HIGH (ref 79–97)
Monocytes Absolute: 1.9 10*3/uL — ABNORMAL HIGH (ref 0.1–0.9)
Monocytes: 16 %
NRBC: 2 % — ABNORMAL HIGH (ref 0–0)
Neutrophils Absolute: 5.7 10*3/uL (ref 1.4–7.0)
Neutrophils: 51 %
Platelets: 354 10*3/uL (ref 150–450)
Potassium: 4.2 mmol/L (ref 3.5–5.2)
RBC: 2.52 x10E6/uL — CL (ref 4.14–5.80)
RDW: 23.9 % — ABNORMAL HIGH (ref 11.6–15.4)
Retic Ct Pct: 17.8 % — ABNORMAL HIGH (ref 0.6–2.6)
Sodium: 139 mmol/L (ref 134–144)
Total Protein: 7.8 g/dL (ref 6.0–8.5)
Vit D, 25-Hydroxy: 9.2 ng/mL — ABNORMAL LOW (ref 30.0–100.0)
WBC: 11.4 10*3/uL — ABNORMAL HIGH (ref 3.4–10.8)
eGFR: 116 mL/min/{1.73_m2} (ref >59)

## 2021-11-22 ENCOUNTER — Other Ambulatory Visit: Payer: Self-pay | Admitting: Nurse Practitioner

## 2021-11-22 DIAGNOSIS — Z79891 Long term (current) use of opiate analgesic: Secondary | ICD-10-CM

## 2021-11-23 ENCOUNTER — Other Ambulatory Visit (HOSPITAL_COMMUNITY): Payer: Self-pay

## 2021-11-23 MED ORDER — OXYCODONE HCL 10 MG PO TABS
10.0000 mg | ORAL_TABLET | ORAL | 0 refills | Status: DC | PRN
  Filled 2021-11-23: qty 90, 15d supply, fill #0

## 2021-11-25 LAB — DRUG SCREEN 764883 11+OXYCO+ALC+CRT-BUND
Amphetamines, Urine: NEGATIVE ng/mL
BENZODIAZ UR QL: NEGATIVE ng/mL
Barbiturate: NEGATIVE ng/mL
Cocaine (Metabolite): NEGATIVE ng/mL
Creatinine: 134 mg/dL (ref 20.0–300.0)
Ethanol: NEGATIVE %
Meperidine: NEGATIVE ng/mL
Methadone Screen, Urine: NEGATIVE ng/mL
OPIATE SCREEN URINE: NEGATIVE ng/mL
Phencyclidine: NEGATIVE ng/mL
Propoxyphene: NEGATIVE ng/mL
Tramadol: NEGATIVE ng/mL
pH, Urine: 5.3 (ref 4.5–8.9)

## 2021-11-25 LAB — OXYCODONE/OXYMORPHONE, CONFIRM
OXYCODONE/OXYMORPH: POSITIVE — AB
OXYCODONE: 499 ng/mL
OXYCODONE: POSITIVE — AB
OXYMORPHONE (GC/MS): 699 ng/mL
OXYMORPHONE: POSITIVE — AB

## 2021-11-25 LAB — CANNABINOID CONFIRMATION, UR
CANNABINOIDS: POSITIVE — AB
Carboxy THC GC/MS Conf: 209 ng/mL

## 2021-12-06 ENCOUNTER — Other Ambulatory Visit: Payer: Self-pay | Admitting: Internal Medicine

## 2021-12-06 DIAGNOSIS — Z79891 Long term (current) use of opiate analgesic: Secondary | ICD-10-CM

## 2021-12-06 NOTE — Telephone Encounter (Signed)
Requested medication (s) are due for refill today - unsure  Requested medication (s) are on the active medication list -yes  Future visit scheduled -yes  Last refill: 11/23/21 #90  Notes to clinic: Request RF: non delegated Rx- provider not approved for [facility] RF- please forward  Requested Prescriptions  Pending Prescriptions Disp Refills   Oxycodone HCl 10 MG TABS 90 tablet 0    Sig: Take 1 tablet (10 mg total) by mouth every 4 (four) hours as needed for up to 15 days.     There is no refill protocol information for this order       Requested Prescriptions  Pending Prescriptions Disp Refills   Oxycodone HCl 10 MG TABS 90 tablet 0    Sig: Take 1 tablet (10 mg total) by mouth every 4 (four) hours as needed for up to 15 days.     There is no refill protocol information for this order

## 2021-12-07 ENCOUNTER — Other Ambulatory Visit (HOSPITAL_COMMUNITY): Payer: Self-pay

## 2021-12-08 ENCOUNTER — Other Ambulatory Visit (HOSPITAL_COMMUNITY): Payer: Self-pay

## 2021-12-08 MED ORDER — OXYCODONE HCL 10 MG PO TABS
10.0000 mg | ORAL_TABLET | ORAL | 0 refills | Status: DC | PRN
  Filled 2021-12-08: qty 90, 15d supply, fill #0

## 2021-12-21 ENCOUNTER — Other Ambulatory Visit: Payer: Self-pay | Admitting: Nurse Practitioner

## 2021-12-21 DIAGNOSIS — Z79891 Long term (current) use of opiate analgesic: Secondary | ICD-10-CM

## 2021-12-23 ENCOUNTER — Other Ambulatory Visit (HOSPITAL_COMMUNITY): Payer: Self-pay

## 2021-12-23 ENCOUNTER — Other Ambulatory Visit: Payer: Self-pay | Admitting: Nurse Practitioner

## 2021-12-23 ENCOUNTER — Telehealth: Payer: Self-pay

## 2021-12-23 DIAGNOSIS — Z79891 Long term (current) use of opiate analgesic: Secondary | ICD-10-CM

## 2021-12-23 MED ORDER — OXYCODONE HCL 10 MG PO TABS
10.0000 mg | ORAL_TABLET | ORAL | 0 refills | Status: DC | PRN
  Filled 2021-12-23: qty 90, 15d supply, fill #0

## 2022-01-06 ENCOUNTER — Other Ambulatory Visit: Payer: Self-pay | Admitting: Nurse Practitioner

## 2022-01-06 DIAGNOSIS — Z79891 Long term (current) use of opiate analgesic: Secondary | ICD-10-CM

## 2022-01-07 ENCOUNTER — Other Ambulatory Visit (HOSPITAL_COMMUNITY): Payer: Self-pay

## 2022-01-09 ENCOUNTER — Other Ambulatory Visit (HOSPITAL_COMMUNITY): Payer: Self-pay

## 2022-01-09 MED ORDER — OXYCODONE HCL 10 MG PO TABS
10.0000 mg | ORAL_TABLET | ORAL | 0 refills | Status: DC | PRN
  Filled 2022-01-09: qty 90, 15d supply, fill #0

## 2022-01-10 ENCOUNTER — Other Ambulatory Visit (HOSPITAL_COMMUNITY): Payer: Self-pay

## 2022-01-20 ENCOUNTER — Other Ambulatory Visit: Payer: Self-pay | Admitting: Nurse Practitioner

## 2022-01-20 DIAGNOSIS — Z79891 Long term (current) use of opiate analgesic: Secondary | ICD-10-CM

## 2022-01-23 ENCOUNTER — Other Ambulatory Visit (HOSPITAL_COMMUNITY): Payer: Self-pay

## 2022-01-23 ENCOUNTER — Other Ambulatory Visit: Payer: Self-pay | Admitting: Nurse Practitioner

## 2022-01-23 DIAGNOSIS — Z79891 Long term (current) use of opiate analgesic: Secondary | ICD-10-CM

## 2022-01-23 MED ORDER — OXYCODONE HCL 10 MG PO TABS
10.0000 mg | ORAL_TABLET | ORAL | 0 refills | Status: DC | PRN
  Filled 2022-01-24: qty 90, 15d supply, fill #0

## 2022-01-24 ENCOUNTER — Other Ambulatory Visit (HOSPITAL_COMMUNITY): Payer: Self-pay

## 2022-02-06 ENCOUNTER — Other Ambulatory Visit: Payer: Self-pay | Admitting: Nurse Practitioner

## 2022-02-06 DIAGNOSIS — Z79891 Long term (current) use of opiate analgesic: Secondary | ICD-10-CM

## 2022-02-08 ENCOUNTER — Other Ambulatory Visit: Payer: Self-pay | Admitting: Nurse Practitioner

## 2022-02-08 ENCOUNTER — Other Ambulatory Visit (HOSPITAL_COMMUNITY): Payer: Self-pay

## 2022-02-08 ENCOUNTER — Telehealth: Payer: Self-pay

## 2022-02-08 DIAGNOSIS — Z79891 Long term (current) use of opiate analgesic: Secondary | ICD-10-CM

## 2022-02-08 MED ORDER — OXYCODONE HCL 10 MG PO TABS
10.0000 mg | ORAL_TABLET | ORAL | 0 refills | Status: DC | PRN
  Filled 2022-02-08: qty 90, 15d supply, fill #0

## 2022-02-08 NOTE — Telephone Encounter (Signed)
The refill has been sent. Thanks  ° °

## 2022-02-08 NOTE — Telephone Encounter (Signed)
Oxycodone  °

## 2022-02-15 ENCOUNTER — Encounter: Payer: Self-pay | Admitting: Nurse Practitioner

## 2022-02-15 ENCOUNTER — Ambulatory Visit (INDEPENDENT_AMBULATORY_CARE_PROVIDER_SITE_OTHER): Payer: Medicaid Other | Admitting: Nurse Practitioner

## 2022-02-15 ENCOUNTER — Other Ambulatory Visit: Payer: Self-pay

## 2022-02-15 VITALS — BP 119/81 | HR 85 | Temp 98.3°F | Ht 71.0 in | Wt 175.2 lb

## 2022-02-15 DIAGNOSIS — Z79891 Long term (current) use of opiate analgesic: Secondary | ICD-10-CM | POA: Diagnosis not present

## 2022-02-15 DIAGNOSIS — F329 Major depressive disorder, single episode, unspecified: Secondary | ICD-10-CM

## 2022-02-15 DIAGNOSIS — D571 Sickle-cell disease without crisis: Secondary | ICD-10-CM | POA: Diagnosis not present

## 2022-02-15 NOTE — Patient Instructions (Signed)
Sickle Cell Anemia, Adult ?Sickle cell anemia is a condition where your red blood cells are shaped like sickles. Red blood cells carry oxygen through the body. Sickle-shaped cells do not live as long as normal red blood cells. They also clump together and block blood from flowing through the blood vessels. This prevents the body from getting enough oxygen. Sickle cell anemia causes organ damage and pain. It also increases the risk of infection. ?Follow these instructions at home: ?Medicines ?Take over-the-counter and prescription medicines only as told by your doctor. ?If you were prescribed an antibiotic medicine, take it as told by your doctor. Do not stop taking the antibiotic even if you start to feel better. ?If you develop a fever, do not take medicines to lower the fever right away. Tell your doctor about the fever. ?Managing pain, stiffness, and swelling ?Try these methods to help with pain: ?Use a heating pad. ?Take a warm bath. ?Distract yourself, such as by watching TV. ?Eating and drinking ?Drink enough fluid to keep your pee (urine) clear or pale yellow. Drink more in hot weather and during exercise. ?Limit or avoid alcohol. ?Eat a healthy diet. Eat plenty of fruits, vegetables, whole grains, and lean protein. ?Take vitamins and supplements as told by your doctor. ?Traveling ?When traveling, keep these with you: ?Your medical information. ?The names of your doctors. ?Your medicines. ?If you need to take an airplane, talk to your doctor first. ?Activity ?Rest often. ?Avoid exercises that make your heart beat much faster, such as jogging. ?General instructions ?Do not use products that have nicotine or tobacco, such as cigarettes and e-cigarettes. If you need help quitting, ask your doctor. ?Consider wearing a medical alert bracelet. ?Avoid being in high places (high altitudes), such as mountains. ?Avoid very hot or cold temperatures. ?Avoid places where the temperature changes a lot. ?Keep all follow-up  visits as told by your doctor. This is important. ?Contact a doctor if: ?A joint hurts. ?Your feet or hands hurt or swell. ?You feel tired (fatigued). ?Get help right away if: ?You have symptoms of infection. These include: ?Fever. ?Chills. ?Being very tired. ?Irritability. ?Poor eating. ?Throwing up (vomiting). ?You feel dizzy or faint. ?You have new stomach pain, especially on the left side. ?You have a an erection (priapism) that lasts more than 4 hours. ?You have numbness in your arms or legs. ?You have a hard time moving your arms or legs. ?You have trouble talking. ?You have pain that does not go away when you take medicine. ?You are short of breath. ?You are breathing fast. ?You have a long-term cough. ?You have pain in your chest. ?You have a bad headache. ?You have a stiff neck. ?Your stomach looks bloated even though you did not eat much. ?Your skin is pale. ?You suddenly cannot see well. ?Summary ?Sickle cell anemia is a condition where your red blood cells are shaped like sickles. ?Follow your doctor's advice on ways to manage pain, food to eat, activities to do, and steps to take for safe travel. ?Get medical help right away if you have any signs of infection, such as a fever. ?This information is not intended to replace advice given to you by your health care provider. Make sure you discuss any questions you have with your health care provider. ?Document Revised: 04/08/2020 Document Reviewed: 04/08/2020 ?Elsevier Patient Education ? 2022 Elsevier Inc. ? ?

## 2022-02-15 NOTE — Progress Notes (Signed)
? ?[facility] ?[place], [place]  [address] ?Phone:  [phone]   Fax:  [phone] ? ? ?Established Patient Office Visit ? ?Subjective:  ?Patient ID: Jerry [name], male    DOB: February 27, 1986  Age: 36 y.o. MRN: [id number] ? ?CC:  ?Chief Complaint  ?Patient presents with  ? Follow-up  ?  Pt is here for 3 month follow up. No issues or concerns  ? ? ?HPI ?Memory Dance presents for follow up. He  has a past medical history of Sickle cell anemia ([place]).  ? ?He is doing well with his SCD.  He continues to work fulltime. Denies fever, headache, cough, wheezing, shortness of breath, chest pains, abdominal pain, back pain, hip pain, or leg pain. Denies any open wounds, skin irritation. ? ? ?Past Medical History:  ?Diagnosis Date  ? Sickle cell anemia (H[facility])   ? ? ?History reviewed. No pertinent surgical history. ? ?Family History  ?Problem Relation Age of Onset  ? Sickle cell trait Mother   ? ? ?Social History  ? ?Socioeconomic History  ? Marital status: Single  ?  Spouse name: Not on file  ? Number of children: Not on file  ? Years of education: Not on file  ? Highest education level: Not on file  ?Occupational History  ? Not on file  ?Tobacco Use  ? Smoking status: Every Day  ? Smokeless tobacco: Never  ?Vaping Use  ? Vaping Use: Some days  ?Substance and Sexual Activity  ? Alcohol use: No  ?  Comment: rare  ? Drug use: Yes  ?  Types: Marijuana  ?  Comment: occasionally  ? Sexual activity: Yes  ?  Birth control/protection: Condom  ?Other Topics Concern  ? Not on file  ?Social History Narrative  ? Not on file  ? ?Social Determinants of Health  ? ?Financial Resource Strain: Not on file  ?Food Insecurity: Not on file  ?Transportation Needs: Not on file  ?Physical Activity: Not on file  ?Stress: Not on file  ?Social Connections: Not on file  ?Intimate Partner Violence: Not on file  ? ? ?Outpatient Medications Prior to Visit  ?Medication Sig Dispense Refill  ? ibuprofen (ADVIL) 800 MG  tablet Take 1 tablet, 3 times a day as needed. 30 tablet 6  ? Oxycodone HCl 10 MG TABS Take 1 tablet (10 mg total) by mouth every 4 (four) hours as needed for up to 15 days. 90 tablet 0  ? hydroxyurea (HYDREA) 500 MG capsule Take 3 capsules (1,500 mg total) by mouth daily. May take with food to minimize GI side effects. (Patient not taking: Reported on 11/16/2021) 90 capsule 6  ? lisinopril (ZESTRIL) 2.5 MG tablet Take 1 tablet (2.5 mg total) by mouth daily. (Patient not taking: Reported on 02/15/2022) 30 tablet 6  ? promethazine (PHENERGAN) 12.5 MG tablet Take 1/2 tablet by mouth every 6 hours as needed for nausea or vomiting. (Patient not taking: Reported on 02/15/2022) 30 tablet 0  ? Vitamin D, Ergocalciferol, (DRISDOL) 1.25 MG (50000 UNIT) CAPS capsule TAKE 1 CAPSULE (50,000 UNITS TOTAL) BY MOUTH EVERY 7 (SEVEN) DAYS. (Patient not taking: Reported on 11/16/2021) 30 capsule 1  ? folic acid (FOLVITE) 1 MG tablet Take 1 tablet (1 mg total) by mouth daily. (Patient not taking: Reported on 11/16/2021) 30 tablet 6  ? ?No facility-administered medications prior to visit.  ? ? ?No Known Allergies ? ?ROS ?Review of Systems ? ?  ?Objective:  ?  ?  Physical Exam ?Constitutional:   ?   General: He is not in acute distress. ?HENT:  ?   Head: Normocephalic and atraumatic.  ?   Nose: Nose normal.  ?   Mouth/Throat:  ?   Mouth: Mucous membranes are moist.  ?Cardiovascular:  ?   Rate and Rhythm: Normal rate.  ?   Pulses: Normal pulses.  ?   Heart sounds: Normal heart sounds.  ?Pulmonary:  ?   Effort: Pulmonary effort is normal.  ?   Breath sounds: Normal breath sounds.  ?Abdominal:  ?   Palpations: Abdomen is soft.  ?Musculoskeletal:     ?   General: Normal range of motion.  ?   Cervical back: Normal range of motion.  ?   Right lower leg: No edema.  ?   Left lower leg: No edema.  ?Skin: ?   General: Skin is warm and dry.  ?   Capillary Refill: Capillary refill takes less than 2 seconds.  ?Neurological:  ?   General: No focal deficit  present.  ?   Mental Status: He is alert.  ?Psychiatric:     ?   Mood and Affect: Mood normal.     ?   Behavior: Behavior normal.     ?   Thought Content: Thought content normal.     ?   Judgment: Judgment normal.  ? ? ?BP 119/81 (BP Location: Right Arm, Patient Position: Sitting, Cuff Size: Normal)   Pulse 85   Temp 98.3 ?F (36.8 ?C)   Ht '5\' 11"'  (1.803 m)   Wt 175 lb 3.2 oz (79.5 kg)   SpO2 100%   BMI 24.44 kg/m?  ?Wt Readings from Last 3 Encounters:  ?02/15/22 175 lb 3.2 oz (79.5 kg)  ?11/16/21 171 lb 4 oz (77.7 kg)  ?03/05/21 180 lb (81.6 kg)  ? ? ? ?Health Maintenance Due  ?Topic Date Due  ? COVID-19 Vaccine (1) Never done  ? ? ?There are no preventive care reminders to display for this patient. ? ?Lab Results  ?Component Value Date  ? TSH 0.809 07/04/2019  ? ?Lab Results  ?Component Value Date  ? WBC 11.4 (H) 11/16/2021  ? HGB 8.6 (L) 11/16/2021  ? HCT 25.4 (L) 11/16/2021  ? MCV 101 (H) 11/16/2021  ? PLT 354 11/16/2021  ? ?Lab Results  ?Component Value Date  ? NA 139 11/16/2021  ? K 4.2 11/16/2021  ? CO2 22 11/16/2021  ? GLUCOSE 81 11/16/2021  ? BUN 8 11/16/2021  ? CREATININE 0.90 11/16/2021  ? BILITOT 2.9 (H) 11/16/2021  ? ALKPHOS 77 11/16/2021  ? AST 36 11/16/2021  ? ALT 16 11/16/2021  ? PROT 7.8 11/16/2021  ? ALBUMIN 4.9 11/16/2021  ? CALCIUM 9.5 11/16/2021  ? ANIONGAP 5 03/05/2021  ? EGFR 116 11/16/2021  ? ?Lab Results  ?Component Value Date  ? CHOL 130 01/09/2017  ? ?Lab Results  ?Component Value Date  ? HDL 46 01/09/2017  ? ?Lab Results  ?Component Value Date  ? [place] 68 01/09/2017  ? ?Lab Results  ?Component Value Date  ? TRIG 81 01/09/2017  ? ?Lab Results  ?Component Value Date  ? CHOLHDL 2.8 01/09/2017  ? ?No results found for: HGBA1C ? ?  ?Assessment & Plan:  ? ?Problem List Items Addressed This Visit   ? ?  ? Other  ? 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 ?Move frequently to reduce venous thromboembolism risk. ?Avoid situations that could lead to  dehydration or could exacerbate pain ?Discussed S&S of infection, seizures, stroke acute chest, DVT and how important it is to seek medical attention ?Take medication as directed along with pain contract and overall compliance ?Discussed the risk related to opiate use (addition, tolerance and dependency) ?  ? Relevant Orders  ? Sickle Cell Panel (Completed)  ? Chronic prescription opiate use ?Stable  ? ?Other Visit Diagnoses   ? ? Major depressive disorder, remission status unspecified, unspecified whether recurrent     ?Stable  ?No current treamtment  ? ?  ? ? ?No orders of the defined types were placed in this encounter. ? ? ?Follow-up: Return in about 3 months (around 05/18/2022) for Follow up SCD [address].  ? ? ?Vevelyn Francois, NP ?

## 2022-02-16 LAB — CMP14+CBC/D/PLT+FER+RETIC+V...
ALT: 15 IU/L (ref 0–44)
AST: 43 IU/L — ABNORMAL HIGH (ref 0–40)
Albumin/Globulin Ratio: 1.6 (ref 1.2–2.2)
Albumin: 4.9 g/dL (ref 4.0–5.0)
Alkaline Phosphatase: 89 IU/L (ref 44–121)
BUN/Creatinine Ratio: 11 (ref 9–20)
BUN: 8 mg/dL (ref 6–20)
Basophils Absolute: 0.1 10*3/uL (ref 0.0–0.2)
Basos: 1 %
Bilirubin Total: 3.5 mg/dL — ABNORMAL HIGH (ref 0.0–1.2)
CO2: 18 mmol/L — ABNORMAL LOW (ref 20–29)
Calcium: 9.3 mg/dL (ref 8.7–10.2)
Chloride: 104 mmol/L (ref 96–106)
Creatinine, Ser: 0.76 mg/dL (ref 0.76–1.27)
EOS (ABSOLUTE): 0.6 10*3/uL — ABNORMAL HIGH (ref 0.0–0.4)
Eos: 5 %
Ferritin: 139 ng/mL (ref 30–400)
Globulin, Total: 3 g/dL (ref 1.5–4.5)
Glucose: 88 mg/dL (ref 70–99)
Hematocrit: 24.3 % — ABNORMAL LOW (ref 37.5–51.0)
Hemoglobin: 8.3 g/dL — ABNORMAL LOW (ref 13.0–17.7)
Immature Grans (Abs): 0.1 10*3/uL (ref 0.0–0.1)
Immature Granulocytes: 1 %
Lymphocytes Absolute: 3.4 10*3/uL — ABNORMAL HIGH (ref 0.7–3.1)
Lymphs: 29 %
MCH: 34.4 pg — ABNORMAL HIGH (ref 26.6–33.0)
MCHC: 34.2 g/dL (ref 31.5–35.7)
MCV: 101 fL — ABNORMAL HIGH (ref 79–97)
Monocytes Absolute: 1.8 10*3/uL — ABNORMAL HIGH (ref 0.1–0.9)
Monocytes: 15 %
NRBC: 2 % — ABNORMAL HIGH (ref 0–0)
Neutrophils Absolute: 5.8 10*3/uL (ref 1.4–7.0)
Neutrophils: 49 %
Platelets: 366 10*3/uL (ref 150–450)
Potassium: 4.3 mmol/L (ref 3.5–5.2)
RBC: 2.41 x10E6/uL — CL (ref 4.14–5.80)
RDW: 23 % — ABNORMAL HIGH (ref 11.6–15.4)
Retic Ct Pct: 15 % — ABNORMAL HIGH (ref 0.6–2.6)
Sodium: 140 mmol/L (ref 134–144)
Total Protein: 7.9 g/dL (ref 6.0–8.5)
Vit D, 25-Hydroxy: 8.2 ng/mL — ABNORMAL LOW (ref 30.0–100.0)
WBC: 11.7 10*3/uL — ABNORMAL HIGH (ref 3.4–10.8)
eGFR: 122 mL/min/{1.73_m2} (ref >59)

## 2022-02-16 MED FILL — Ergocalciferol Cap 1.25 MG (50000 Unit): ORAL | 84 days supply | Qty: 12 | Fill #2 | Status: CN

## 2022-02-17 ENCOUNTER — Other Ambulatory Visit: Payer: Self-pay | Admitting: Nurse Practitioner

## 2022-02-17 ENCOUNTER — Other Ambulatory Visit: Payer: Self-pay

## 2022-02-17 DIAGNOSIS — D571 Sickle-cell disease without crisis: Secondary | ICD-10-CM

## 2022-02-17 MED ORDER — FOLIC ACID 1 MG PO TABS
1.0000 mg | ORAL_TABLET | Freq: Every day | ORAL | 3 refills | Status: DC
  Filled 2022-02-17: qty 30, 30d supply, fill #0
  Filled 2022-03-24: qty 30, 30d supply, fill #1
  Filled 2022-04-26 – 2022-05-09 (×2): qty 30, 30d supply, fill #2
  Filled 2022-06-24: qty 30, 30d supply, fill #3
  Filled 2022-08-25: qty 30, 30d supply, fill #4

## 2022-02-17 MED FILL — Ergocalciferol Cap 1.25 MG (50000 Unit): ORAL | 28 days supply | Qty: 4 | Fill #0 | Status: AC

## 2022-02-20 ENCOUNTER — Other Ambulatory Visit: Payer: Self-pay | Admitting: Nurse Practitioner

## 2022-02-20 ENCOUNTER — Other Ambulatory Visit: Payer: Self-pay

## 2022-02-20 DIAGNOSIS — Z79891 Long term (current) use of opiate analgesic: Secondary | ICD-10-CM

## 2022-02-20 MED ORDER — OXYCODONE HCL 10 MG PO TABS
10.0000 mg | ORAL_TABLET | ORAL | 0 refills | Status: DC | PRN
  Filled 2022-02-23: qty 90, 15d supply, fill #0

## 2022-02-23 ENCOUNTER — Other Ambulatory Visit (HOSPITAL_COMMUNITY): Payer: Self-pay

## 2022-02-23 ENCOUNTER — Other Ambulatory Visit: Payer: Self-pay

## 2022-03-09 ENCOUNTER — Other Ambulatory Visit: Payer: Self-pay | Admitting: Nurse Practitioner

## 2022-03-09 ENCOUNTER — Other Ambulatory Visit (HOSPITAL_COMMUNITY): Payer: Self-pay

## 2022-03-09 DIAGNOSIS — Z79891 Long term (current) use of opiate analgesic: Secondary | ICD-10-CM

## 2022-03-09 MED ORDER — OXYCODONE HCL 10 MG PO TABS
10.0000 mg | ORAL_TABLET | ORAL | 0 refills | Status: DC | PRN
  Filled 2022-03-09: qty 90, 15d supply, fill #0

## 2022-03-23 ENCOUNTER — Other Ambulatory Visit: Payer: Self-pay | Admitting: Nurse Practitioner

## 2022-03-23 ENCOUNTER — Other Ambulatory Visit: Payer: Self-pay | Admitting: Internal Medicine

## 2022-03-23 DIAGNOSIS — Z79891 Long term (current) use of opiate analgesic: Secondary | ICD-10-CM

## 2022-03-24 ENCOUNTER — Other Ambulatory Visit (HOSPITAL_COMMUNITY): Payer: Self-pay

## 2022-03-24 MED ORDER — VITAMIN D (ERGOCALCIFEROL) 1.25 MG (50000 UNIT) PO CAPS
ORAL_CAPSULE | ORAL | 1 refills | Status: DC
  Filled 2022-03-24: qty 12, 84d supply, fill #0
  Filled 2022-05-25: qty 12, 84d supply, fill #1

## 2022-03-25 ENCOUNTER — Other Ambulatory Visit: Payer: Self-pay | Admitting: Internal Medicine

## 2022-03-25 ENCOUNTER — Other Ambulatory Visit (HOSPITAL_COMMUNITY): Payer: Self-pay

## 2022-03-25 DIAGNOSIS — Z79891 Long term (current) use of opiate analgesic: Secondary | ICD-10-CM

## 2022-03-25 MED ORDER — OXYCODONE HCL 10 MG PO TABS
10.0000 mg | ORAL_TABLET | ORAL | 0 refills | Status: DC | PRN
  Filled 2022-03-25: qty 90, 15d supply, fill #0

## 2022-04-07 ENCOUNTER — Other Ambulatory Visit: Payer: Self-pay | Admitting: Internal Medicine

## 2022-04-07 DIAGNOSIS — Z79891 Long term (current) use of opiate analgesic: Secondary | ICD-10-CM

## 2022-04-10 ENCOUNTER — Other Ambulatory Visit: Payer: Self-pay | Admitting: Internal Medicine

## 2022-04-10 ENCOUNTER — Telehealth: Payer: Self-pay

## 2022-04-10 ENCOUNTER — Other Ambulatory Visit (HOSPITAL_COMMUNITY): Payer: Self-pay

## 2022-04-10 DIAGNOSIS — Z79891 Long term (current) use of opiate analgesic: Secondary | ICD-10-CM

## 2022-04-10 MED ORDER — OXYCODONE HCL 10 MG PO TABS
10.0000 mg | ORAL_TABLET | ORAL | 0 refills | Status: DC | PRN
  Filled 2022-04-10: qty 90, 15d supply, fill #0

## 2022-04-10 NOTE — Telephone Encounter (Signed)
Oxycodone  °

## 2022-04-25 ENCOUNTER — Other Ambulatory Visit: Payer: Self-pay | Admitting: Internal Medicine

## 2022-04-25 DIAGNOSIS — Z79891 Long term (current) use of opiate analgesic: Secondary | ICD-10-CM

## 2022-04-26 ENCOUNTER — Telehealth: Payer: Self-pay | Admitting: Nurse Practitioner

## 2022-04-26 ENCOUNTER — Other Ambulatory Visit (HOSPITAL_COMMUNITY): Payer: Self-pay

## 2022-04-26 ENCOUNTER — Other Ambulatory Visit: Payer: Self-pay

## 2022-04-26 MED ORDER — OXYCODONE HCL 10 MG PO TABS
10.0000 mg | ORAL_TABLET | ORAL | 0 refills | Status: DC | PRN
  Filled 2022-04-26: qty 90, 15d supply, fill #0

## 2022-04-26 NOTE — Telephone Encounter (Signed)
Oxycodone refill request.

## 2022-04-27 ENCOUNTER — Other Ambulatory Visit: Payer: Self-pay

## 2022-05-04 ENCOUNTER — Other Ambulatory Visit: Payer: Self-pay

## 2022-05-09 ENCOUNTER — Other Ambulatory Visit: Payer: Self-pay | Admitting: Nurse Practitioner

## 2022-05-09 ENCOUNTER — Other Ambulatory Visit (HOSPITAL_COMMUNITY): Payer: Self-pay

## 2022-05-09 MED ORDER — OXYCODONE HCL 10 MG PO TABS
10.0000 mg | ORAL_TABLET | ORAL | 0 refills | Status: DC | PRN
  Filled 2022-05-09: qty 90, 15d supply, fill #0

## 2022-05-16 ENCOUNTER — Ambulatory Visit (INDEPENDENT_AMBULATORY_CARE_PROVIDER_SITE_OTHER): Payer: Medicaid Other | Admitting: Nurse Practitioner

## 2022-05-16 ENCOUNTER — Other Ambulatory Visit: Payer: Self-pay

## 2022-05-16 ENCOUNTER — Encounter: Payer: Self-pay | Admitting: Nurse Practitioner

## 2022-05-16 VITALS — BP 114/66 | HR 86 | Temp 97.8°F | Ht 71.0 in | Wt 170.2 lb

## 2022-05-16 DIAGNOSIS — D571 Sickle-cell disease without crisis: Secondary | ICD-10-CM

## 2022-05-16 DIAGNOSIS — R801 Persistent proteinuria, unspecified: Secondary | ICD-10-CM | POA: Diagnosis not present

## 2022-05-16 MED ORDER — LISINOPRIL 2.5 MG PO TABS
2.5000 mg | ORAL_TABLET | Freq: Every day | ORAL | 6 refills | Status: DC
  Filled 2022-05-16 – 2022-05-26 (×2): qty 30, 30d supply, fill #0
  Filled 2022-06-24: qty 30, 30d supply, fill #1
  Filled 2022-08-10 – 2022-08-25 (×2): qty 30, 30d supply, fill #2

## 2022-05-16 NOTE — Patient Instructions (Signed)
You were seen today in the [facility] for reevaluation of SCD. Labs were collected, results will be available via MyChart or, if abnormal, you will be contacted by clinic staff. You were prescribed medications, please take as directed. Please follow up in 3 mths for reevaluation of SCD 

## 2022-05-16 NOTE — Progress Notes (Signed)
[facility], [place]  [address] Phone:  [phone]   Fax:  [phone] Subjective:   Patient ID: Jerry [name], male    DOB: [birth year], 36 y.o.   MRN: [id number]  Chief Complaint  Patient presents with   Follow-up    Pt is here for 3 month's SCD follow up.   HPI Jerry [name] 36 y.o. male  has a past medical history of Sickle cell anemia ([place]). To the [facility] for reevaluation of SCD.   States that since his last visit, denies any crisis. Symptoms have improved with current regimen. Currently compliant with supplements, but has not been taking lisinopril and hydroxyurea. Requesting hydroxyurea be discontinued, has not taken in several months. Monitors diet and exercises regularly. Denies any other concerns today.   Denies any fatigue, chest pain, shortness of breath, HA or dizziness. Denies any blurred vision, numbness or tingling.   Past Medical History:  Diagnosis Date   Sickle cell anemia ([place])     History reviewed. No pertinent surgical history.  Family History  Problem Relation Age of Onset   Sickle cell trait Mother     Social History   Socioeconomic History   Marital status: Single    Spouse name: Not on file   Number of children: Not on file   Years of education: Not on file   Highest education level: Not on file  Occupational History   Not on file  Tobacco Use   Smoking status: Every Day   Smokeless tobacco: Never  Vaping Use   Vaping Use: Some days  Substance and Sexual Activity   Alcohol use: No    Comment: rare   Drug use: Yes    Types: Marijuana    Comment: occasionally   Sexual activity: Yes    Birth control/protection: Condom  Other Topics Concern   Not on file  Social History Narrative   Not on file   Social Determinants of Health   Financial Resource Strain: Not on file  Food Insecurity: Not on file  Transportation Needs: Not on file  Physical Activity: Not on file  Stress: Not  on file  Social Connections: Not on file  Intimate Partner Violence: Not on file    Outpatient Medications Prior to Visit  Medication Sig Dispense Refill   folic acid (FOLVITE) 1 MG tablet Take 1 tablet (1 mg total) by mouth daily. 90 tablet 3   Oxycodone HCl 10 MG TABS Take 1 tablet (10 mg total) by mouth every 4 (four) hours as needed (up to 15 days). 90 tablet 0   hydroxyurea (HYDREA) 500 MG capsule Take 3 capsules (1,500 mg total) by mouth daily. May take with food to minimize GI side effects. (Patient not taking: Reported on 11/16/2021) 90 capsule 6   ibuprofen (ADVIL) 800 MG tablet Take 1 tablet, 3 times a day as needed. (Patient not taking: Reported on 05/16/2022) 30 tablet 6   promethazine (PHENERGAN) 12.5 MG tablet Take 1/2 tablet by mouth every 6 hours as needed for nausea or vomiting. (Patient not taking: Reported on 02/15/2022) 30 tablet 0   Vitamin D, Ergocalciferol, (DRISDOL) 1.25 MG (50000 UNIT) CAPS capsule TAKE 1 CAPSULE BY MOUTH EVERY 7 (SEVEN) DAYS. 30 capsule 1   lisinopril (ZESTRIL) 2.5 MG tablet Take 1 tablet (2.5 mg total) by mouth daily. (Patient not taking: Reported on 02/15/2022) 30 tablet 6   No facility-administered medications prior to visit.    No Known  Allergies  Review of Systems  Constitutional: Negative.  Negative for chills, fever and malaise/fatigue.  Eyes: Negative.   Respiratory:  Negative for cough and shortness of breath.   Cardiovascular:  Negative for chest pain, palpitations and leg swelling.  Gastrointestinal:  Negative for abdominal pain, blood in stool, constipation, diarrhea, nausea and vomiting.  Skin: Negative.   Neurological: Negative.   Psychiatric/Behavioral:  Negative for depression. The patient is not nervous/anxious.   All other systems reviewed and are negative.      Objective:    Physical Exam Vitals reviewed.  Constitutional:      General: He is not in acute distress.    Appearance: Normal appearance.  HENT:     Head:  Normocephalic.  Neck:     Vascular: No carotid bruit.  Cardiovascular:     Rate and Rhythm: Normal rate and regular rhythm.     Pulses: Normal pulses.     Heart sounds: Normal heart sounds.     Comments: No obvious peripheral edema Pulmonary:     Effort: Pulmonary effort is normal.     Breath sounds: Normal breath sounds.  Musculoskeletal:        General: No swelling, tenderness, deformity or signs of injury. Normal range of motion.     Cervical back: Normal range of motion and neck supple. No rigidity or tenderness.     Right lower leg: No edema.     Left lower leg: No edema.  Lymphadenopathy:     Cervical: No cervical adenopathy.  Skin:    General: Skin is warm and dry.     Capillary Refill: Capillary refill takes less than 2 seconds.  Neurological:     General: No focal deficit present.     Mental Status: He is alert and oriented to person, place, and time.  Psychiatric:        Mood and Affect: Mood normal.        Behavior: Behavior normal.        Thought Content: Thought content normal.        Judgment: Judgment normal.     BP 114/66 (BP Location: Right Arm, Patient Position: Sitting, Cuff Size: Normal)   Pulse 86   Temp 97.8 F (36.6 C)   Ht '5\' 11"'  (1.803 m)   Wt 170 lb 4 oz (77.2 kg)   SpO2 100%   BMI 23.75 kg/m  Wt Readings from Last 3 Encounters:  05/16/22 170 lb 4 oz (77.2 kg)  02/15/22 175 lb 3.2 oz (79.5 kg)  11/16/21 171 lb 4 oz (77.7 kg)    Immunization History  Administered Date(s) Administered   HPV 9-valent 08/07/2018, 01/17/2019, 06/17/2019   Influenza,inj,Quad PF,6+ Mos 08/07/2018   Pneumococcal Polysaccharide-23 05/09/2018   Tdap 04/26/2017    Diabetic Foot Exam - Simple   No data filed     Lab Results  Component Value Date   TSH 0.809 07/04/2019   Lab Results  Component Value Date   WBC 10.0 05/16/2022   HGB 8.3 (L) 05/16/2022   HCT 22.8 (L) 05/16/2022   MCV 95 05/16/2022   PLT 332 05/16/2022   Lab Results  Component Value  Date   NA 139 05/16/2022   K 4.3 05/16/2022   CO2 21 05/16/2022   GLUCOSE 94 05/16/2022   BUN 10 05/16/2022   CREATININE 0.96 05/16/2022   BILITOT 3.2 (H) 05/16/2022   ALKPHOS 83 05/16/2022   AST 47 (H) 05/16/2022   ALT 23 05/16/2022   PROT 8.6 (  H) 05/16/2022   ALBUMIN 5.0 05/16/2022   CALCIUM 9.4 05/16/2022   ANIONGAP 5 03/05/2021   EGFR 107 05/16/2022   Lab Results  Component Value Date   CHOL 130 01/09/2017   Lab Results  Component Value Date   HDL 46 01/09/2017   Lab Results  Component Value Date   LDLCALC 68 01/09/2017   Lab Results  Component Value Date   TRIG 81 01/09/2017   Lab Results  Component Value Date   CHOLHDL 2.8 01/09/2017   No results found for: "HGBA1C"     Assessment & Plan:   Problem List Items Addressed This Visit       Other   Hb-SS disease without crisis ([place])   Relevant Medications   lisinopril (ZESTRIL) 2.5 MG tablet, refilled without change  Encouraged to maintain current treatment plan for SCD Encouraged to take medications as prescribed    Sickle cell anemia ([place]) - Primary   Relevant Orders   CBC with Differential/Platelet (Completed)   Comprehensive metabolic panel (Completed)   Folate (Completed)   Vitamin D, 25-hydroxy (Completed)   Persistent proteinuria   Relevant Medications   lisinopril (ZESTRIL) 2.5 MG tablet  Follow up in 3 mths for reevaluation of SCD, sooner as needed    I am having Kavaughn K. Rajagopalan maintain his ibuprofen, hydroxyurea, promethazine, folic acid, Vitamin D (Ergocalciferol), Oxycodone HCl, and lisinopril.  Meds ordered this encounter  Medications   lisinopril (ZESTRIL) 2.5 MG tablet    Sig: Take 1 tablet (2.5 mg total) by mouth daily.    Dispense:  30 tablet    Refill:  6     Teena Dunk, NP

## 2022-05-17 LAB — COMPREHENSIVE METABOLIC PANEL
ALT: 23 IU/L (ref 0–44)
AST: 47 IU/L — ABNORMAL HIGH (ref 0–40)
Albumin/Globulin Ratio: 1.4 (ref 1.2–2.2)
Albumin: 5 g/dL (ref 4.0–5.0)
Alkaline Phosphatase: 83 IU/L (ref 44–121)
BUN/Creatinine Ratio: 10 (ref 9–20)
BUN: 10 mg/dL (ref 6–20)
Bilirubin Total: 3.2 mg/dL — ABNORMAL HIGH (ref 0.0–1.2)
CO2: 21 mmol/L (ref 20–29)
Calcium: 9.4 mg/dL (ref 8.7–10.2)
Chloride: 104 mmol/L (ref 96–106)
Creatinine, Ser: 0.96 mg/dL (ref 0.76–1.27)
Globulin, Total: 3.6 g/dL (ref 1.5–4.5)
Glucose: 94 mg/dL (ref 70–99)
Potassium: 4.3 mmol/L (ref 3.5–5.2)
Sodium: 139 mmol/L (ref 134–144)
Total Protein: 8.6 g/dL — ABNORMAL HIGH (ref 6.0–8.5)
eGFR: 107 mL/min/{1.73_m2} (ref >59)

## 2022-05-17 LAB — CBC WITH DIFFERENTIAL/PLATELET
Basophils Absolute: 0 10*3/uL (ref 0.0–0.2)
Basos: 0 %
EOS (ABSOLUTE): 0.4 10*3/uL (ref 0.0–0.4)
Eos: 4 %
Hematocrit: 22.8 % — ABNORMAL LOW (ref 37.5–51.0)
Hemoglobin: 8.3 g/dL — ABNORMAL LOW (ref 13.0–17.7)
Immature Grans (Abs): 0 10*3/uL (ref 0.0–0.1)
Immature Granulocytes: 0 %
Lymphocytes Absolute: 3.7 10*3/uL — ABNORMAL HIGH (ref 0.7–3.1)
Lymphs: 37 %
MCH: 34.6 pg — ABNORMAL HIGH (ref 26.6–33.0)
MCHC: 36.4 g/dL — ABNORMAL HIGH (ref 31.5–35.7)
MCV: 95 fL (ref 79–97)
Monocytes Absolute: 1.4 10*3/uL — ABNORMAL HIGH (ref 0.1–0.9)
Monocytes: 14 %
NRBC: 1 % — ABNORMAL HIGH (ref 0–0)
Neutrophils Absolute: 4.5 10*3/uL (ref 1.4–7.0)
Neutrophils: 45 %
Platelets: 332 10*3/uL (ref 150–450)
RBC: 2.4 x10E6/uL — CL (ref 4.14–5.80)
RDW: 21.5 % — ABNORMAL HIGH (ref 11.6–15.4)
WBC: 10 10*3/uL (ref 3.4–10.8)

## 2022-05-17 LAB — VITAMIN D 25 HYDROXY (VIT D DEFICIENCY, FRACTURES): Vit D, 25-Hydroxy: 23.9 ng/mL — ABNORMAL LOW (ref 30.0–100.0)

## 2022-05-17 LAB — FOLATE: Folate: 13.7 ng/mL (ref >3.0)

## 2022-05-19 ENCOUNTER — Ambulatory Visit: Payer: Medicaid Other | Admitting: Nurse Practitioner

## 2022-05-22 ENCOUNTER — Other Ambulatory Visit: Payer: Self-pay

## 2022-05-23 ENCOUNTER — Other Ambulatory Visit (HOSPITAL_COMMUNITY): Payer: Self-pay

## 2022-05-25 ENCOUNTER — Other Ambulatory Visit (HOSPITAL_COMMUNITY): Payer: Self-pay

## 2022-05-25 ENCOUNTER — Other Ambulatory Visit: Payer: Self-pay | Admitting: Nurse Practitioner

## 2022-05-26 ENCOUNTER — Other Ambulatory Visit (HOSPITAL_COMMUNITY): Payer: Self-pay

## 2022-05-26 MED ORDER — OXYCODONE HCL 10 MG PO TABS
10.0000 mg | ORAL_TABLET | ORAL | 0 refills | Status: DC | PRN
  Filled 2022-05-26: qty 90, 15d supply, fill #0

## 2022-06-07 NOTE — Telephone Encounter (Signed)
Error

## 2022-06-09 ENCOUNTER — Other Ambulatory Visit: Payer: Self-pay | Admitting: Nurse Practitioner

## 2022-06-10 ENCOUNTER — Other Ambulatory Visit: Payer: Self-pay | Admitting: Family Medicine

## 2022-06-10 ENCOUNTER — Other Ambulatory Visit (HOSPITAL_COMMUNITY): Payer: Self-pay

## 2022-06-10 DIAGNOSIS — D571 Sickle-cell disease without crisis: Secondary | ICD-10-CM

## 2022-06-10 DIAGNOSIS — Z79891 Long term (current) use of opiate analgesic: Secondary | ICD-10-CM

## 2022-06-10 MED ORDER — OXYCODONE HCL 10 MG PO TABS
10.0000 mg | ORAL_TABLET | ORAL | 0 refills | Status: DC | PRN
  Filled 2022-06-10: qty 90, 15d supply, fill #0

## 2022-06-10 NOTE — Progress Notes (Signed)
Reviewed PDMP substance reporting system prior to prescribing opiate medications. No inconsistencies noted.  Meds ordered this encounter  Medications   Oxycodone HCl 10 MG TABS    Sig: Take 1 tablet by mouth every 4 hours as needed (up to 15 days).    Dispense:  90 tablet    Refill:  0    Order Specific Question:   Supervising Provider    Answer:   JEGEDE, OLUGBEMIGA E [[id number]]   Jerry Edmiston Moore Bellamy Rubey  APRN, MSN, FNP-C [facility] [address]  [place], [place] [address] [phone]  

## 2022-06-24 ENCOUNTER — Other Ambulatory Visit (HOSPITAL_COMMUNITY): Payer: Self-pay

## 2022-06-24 ENCOUNTER — Other Ambulatory Visit: Payer: Self-pay | Admitting: Family Medicine

## 2022-06-24 DIAGNOSIS — Z79891 Long term (current) use of opiate analgesic: Secondary | ICD-10-CM

## 2022-06-24 DIAGNOSIS — D571 Sickle-cell disease without crisis: Secondary | ICD-10-CM

## 2022-06-26 ENCOUNTER — Other Ambulatory Visit: Payer: Self-pay | Admitting: Family Medicine

## 2022-06-26 ENCOUNTER — Other Ambulatory Visit: Payer: Self-pay

## 2022-06-26 ENCOUNTER — Telehealth: Payer: Self-pay | Admitting: Family Medicine

## 2022-06-26 ENCOUNTER — Other Ambulatory Visit (HOSPITAL_COMMUNITY): Payer: Self-pay

## 2022-06-26 DIAGNOSIS — Z79891 Long term (current) use of opiate analgesic: Secondary | ICD-10-CM

## 2022-06-26 DIAGNOSIS — D571 Sickle-cell disease without crisis: Secondary | ICD-10-CM

## 2022-06-26 MED ORDER — OXYCODONE HCL 10 MG PO TABS
10.0000 mg | ORAL_TABLET | ORAL | 0 refills | Status: DC | PRN
  Filled 2022-06-26: qty 90, 15d supply, fill #0

## 2022-06-26 NOTE — Telephone Encounter (Signed)
Please resend medications to [facility]

## 2022-06-26 NOTE — Progress Notes (Signed)
Reviewed PDMP substance reporting system prior to prescribing opiate medications. No inconsistencies noted.  Meds ordered this encounter  Medications   Oxycodone HCl 10 MG TABS    Sig: Take 1 tablet by mouth every 4 hours as needed (up to 15 days).    Dispense:  90 tablet    Refill:  0    Order Specific Question:   Supervising Provider    Answer:   JEGEDE, OLUGBEMIGA E [[id number]]   Jerry Pasch Moore [name]  APRN, MSN, FNP-C [facility] [address]  [place], [place] [address] [phone]  

## 2022-06-26 NOTE — Progress Notes (Signed)
Patient's pharmacy changed due to issues with stock.   Nolon Nations  APRN, MSN, FNP-C Patient Care [facility] [address] [place], [place] [address] [phone]

## 2022-06-26 NOTE — Telephone Encounter (Signed)
Oxycodone refill request.

## 2022-06-26 NOTE — Telephone Encounter (Signed)
Oxycodone   Pt asked if he can get the refill by noon since he has to work

## 2022-06-29 NOTE — Telephone Encounter (Signed)
Please send medications needed as RX request for the correct pharmacy. Thanks.

## 2022-07-03 NOTE — Telephone Encounter (Signed)
Spoke to patient he stated it has been taken cared of

## 2022-07-10 ENCOUNTER — Other Ambulatory Visit: Payer: Self-pay | Admitting: Family Medicine

## 2022-07-10 DIAGNOSIS — D571 Sickle-cell disease without crisis: Secondary | ICD-10-CM

## 2022-07-10 DIAGNOSIS — Z79891 Long term (current) use of opiate analgesic: Secondary | ICD-10-CM

## 2022-07-11 ENCOUNTER — Other Ambulatory Visit: Payer: Self-pay | Admitting: Family Medicine

## 2022-07-11 ENCOUNTER — Other Ambulatory Visit: Payer: Self-pay

## 2022-07-11 ENCOUNTER — Other Ambulatory Visit (HOSPITAL_COMMUNITY): Payer: Self-pay

## 2022-07-11 DIAGNOSIS — D571 Sickle-cell disease without crisis: Secondary | ICD-10-CM

## 2022-07-11 DIAGNOSIS — Z79891 Long term (current) use of opiate analgesic: Secondary | ICD-10-CM

## 2022-07-11 MED ORDER — OXYCODONE HCL 10 MG PO TABS
10.0000 mg | ORAL_TABLET | ORAL | 0 refills | Status: DC | PRN
  Filled 2022-07-11 – 2022-07-12 (×6): qty 90, 15d supply, fill #0

## 2022-07-11 NOTE — Progress Notes (Signed)
Reviewed PDMP substance reporting system prior to prescribing opiate medications. No inconsistencies noted.  Meds ordered this encounter  Medications   Oxycodone HCl 10 MG TABS    Sig: Take 1 tablet by mouth every 4 hours as needed (up to 15 days).    Dispense:  90 tablet    Refill:  0    Order Specific Question:   Supervising Provider    Answer:   JEGEDE, OLUGBEMIGA E [[id number]]   Aylssa Herrig Moore Skylier Kretschmer  APRN, MSN, FNP-C [facility] [address]  [place], [place] [address] [phone]  

## 2022-07-12 ENCOUNTER — Other Ambulatory Visit: Payer: Self-pay | Admitting: Family Medicine

## 2022-07-12 ENCOUNTER — Other Ambulatory Visit (HOSPITAL_COMMUNITY): Payer: Self-pay

## 2022-07-12 ENCOUNTER — Other Ambulatory Visit: Payer: Self-pay

## 2022-07-12 ENCOUNTER — Telehealth: Payer: Self-pay

## 2022-07-12 DIAGNOSIS — D571 Sickle-cell disease without crisis: Secondary | ICD-10-CM

## 2022-07-12 DIAGNOSIS — Z79891 Long term (current) use of opiate analgesic: Secondary | ICD-10-CM

## 2022-07-12 MED ORDER — OXYCODONE HCL 10 MG PO TABS
10.0000 mg | ORAL_TABLET | ORAL | 0 refills | Status: DC | PRN
  Filled 2022-07-12: qty 90, 15d supply, fill #0

## 2022-07-12 NOTE — Telephone Encounter (Signed)
Pt called and asked if his OXY can be switched to [employer] dur=e to shortage at other pharmacy

## 2022-07-12 NOTE — Progress Notes (Signed)
Pharmacy changed due to stock issues.   Nolon Nations  APRN, MSN, FNP-C Patient Care [facility] [address] [place], [place] [address] [phone]

## 2022-07-25 ENCOUNTER — Ambulatory Visit (INDEPENDENT_AMBULATORY_CARE_PROVIDER_SITE_OTHER): Payer: Medicaid Other | Admitting: Family Medicine

## 2022-07-25 ENCOUNTER — Other Ambulatory Visit: Payer: Self-pay

## 2022-07-25 ENCOUNTER — Other Ambulatory Visit (HOSPITAL_COMMUNITY): Payer: Self-pay

## 2022-07-25 ENCOUNTER — Encounter: Payer: Self-pay | Admitting: Family Medicine

## 2022-07-25 VITALS — BP 124/79 | HR 75 | Temp 98.0°F | Ht 71.0 in | Wt 169.8 lb

## 2022-07-25 DIAGNOSIS — D571 Sickle-cell disease without crisis: Secondary | ICD-10-CM | POA: Diagnosis not present

## 2022-07-25 DIAGNOSIS — Z79891 Long term (current) use of opiate analgesic: Secondary | ICD-10-CM

## 2022-07-25 DIAGNOSIS — J9601 Acute respiratory failure with hypoxia: Secondary | ICD-10-CM | POA: Insufficient documentation

## 2022-07-25 DIAGNOSIS — E559 Vitamin D deficiency, unspecified: Secondary | ICD-10-CM

## 2022-07-25 MED ORDER — VITAMIN D (ERGOCALCIFEROL) 1.25 MG (50000 UNIT) PO CAPS
ORAL_CAPSULE | ORAL | 3 refills | Status: DC
  Filled 2022-07-25 – 2022-08-25 (×2): qty 12, 84d supply, fill #0

## 2022-07-25 MED ORDER — OXYCODONE HCL 10 MG PO TABS
10.0000 mg | ORAL_TABLET | ORAL | 0 refills | Status: DC | PRN
  Filled 2022-07-26: qty 90, 15d supply, fill #0

## 2022-07-25 NOTE — Patient Instructions (Signed)
Living With Sickle Cell Disease Living with a long-term condition, such as sickle cell disease, can be a challenge. It can affect both your physical and mental health. You may not have total control over your condition. But proper care and treatment can help manage the effects of the disease so you can feel good and lead an active life. You can take steps to manage your condition and stay as healthy as possible. How does sickle cell disease affect me? Sickle cell disease can cause challenges that affect your quality of life. You may get sick more often as a result of organ damage and infections. Sometimes you may need to stay in the hospital. Learn how to recognize that you are not feeling well and that you may be getting sick. What actions can I take to manage my condition?  The goals of treatment are to control your symptoms and prevent and treat problems. Work with your health care provider to create a treatment plan that works for you. Taking an active role in managing your condition can help you feel more in control of your situation. Ask about possible side effects of medicines that your health care provider recommends. Discuss how you feel about having those side effects. Keeping a healthy lifestyle can help you manage your condition. This includes eating a healthy diet, getting enough sleep, and getting regular exercise. Sickle cell disease may affect your ability to take care of your basic needs. Tell your health care provider if you have concerns about any of these needs: Access to food. Housing. Safe drinking water and other utilities. Safety in your home and community. Work or school. Transportation. Paying for health care. Your health care provider may be able to connect you with community resources that can help you. How to manage stress  Living with sickle cell disease can be stressful. This disease can have a big impact on your mental health. Talk with your health care provider  about ways to reduce your stress or if you have concerns about your mental health.  To cope with stress, try: Keeping a stress diary. This can help you learn what causes your stress to start (figure out your triggers) and how to control your response to those triggers. Spending time doing things that you enjoy, such as: Hobbies. Being outdoors. Spending time with friends and people who make you laugh. Doing yoga, muscle relaxation, deep breathing, or mindfulness practices. Expressing yourself through journal writing, art, crafting, poetry, or playing music. Staying positive about your health. Try to accept that you cannot control your condition perfectly. Follow these instructions at home: Medicines Take over-the-counter and prescription medicines only as told by your health care provider. If you were prescribed antibiotics, take them as told by your health care provider. Do not stop taking them even if you start to feel better. If you develop a fever, do not take medicines to reduce the fever right away. This could cover up another problem. Contact your health care provider. Eating and drinking Drink enough fluid to keep your urine pale yellow. Drink more in hot weather and during exercise. Limit or avoid drinking alcohol. Eat a balanced and nutritious diet. Eat plenty of fruits, vegetables, whole grains, and lean protein. Take vitamins and supplements as told by your health care provider. Traveling When traveling, keep these with you: Your medical information. The names of your health care providers. Your medicines. If you have to travel by air, ask about precautions you should take. Managing pain Work with   your health care provider to create a pain management plan that works for you. The plan may include: Ways to reduce or manage your pain at home, such as: Using a heating pad. Taking a warm bath. Using healthy ways to distract you from the pain, such as hobbies or  reading. Practicing ways to relax, such as doing yoga or listening to music. Getting massages. Doing exercises or stretches as told by a physical therapist. Tracking how pain affects your daily life functions. When to seek help. Who to contact and what to do in case of a pain emergency. General instructions Do not use any products that contain nicotine or tobacco. These products include cigarettes, chewing tobacco, and vaping devices, such as e-cigarettes. These lower blood oxygen levels. If you need help quitting, ask your health care provider. Consider wearing a medical alert bracelet. Use an app or journal to track your symptoms, assess your level of pain and fatigue, and keep track of your medicines. Avoid the following: High altitudes. Very high or low temperatures and big changes in temperature. Activities that will lower your oxygen levels, such as mountain climbing or doing exercise that takes a lot of effort. Stay up to date on: Your treatment plan. Learn as much as you can about your condition. Health screenings. This will help prevent problems or catch them early on. Vaccines. This will help prevent infection. Wash your hands often with soap and water to help prevent infections. Wash them for at least 20 seconds each time. Keep all follow-up visits. Regular follow-up with your health care provider can help you better manage your condition. Where to find support You can find help and support through: Talking with a therapist or taking part in support groups. Sickle Cell Disease Foundation of [place]: www.sicklecelldisease.org Where to find more information Centers for Disease Control and Prevention: www.cdc.gov American Society of Hematology: www.hematology.org Contact a health care provider if: Your symptoms get worse. You have new symptoms. You have a fever. Get help right away if: You have a painful erection of the penis that lasts a long time (priapism). You become  short of breath or are having trouble breathing. You have pain that cannot be controlled with medicine. You have any signs of a stroke. "BE FAST" is an easy way to remember the main warning signs: B - Balance. Dizziness, sudden trouble walking, or loss of balance. E - Eyes. Trouble seeing or a change in how you see. F - Face. Sudden weakness or loss of feeling of the face. The face or eyelid may droop on one side. A - Arms. Weakness or loss of feeling in an arm. This happens all of a sudden and most often on one side of the body. S - Speech. Sudden trouble speaking, slurred speech, or trouble understanding what people say. T - Time. Time to call emergency services. Write down what time symptoms started. You have other signs of a stroke, such as: A sudden, very bad headache with no known cause. Feeling like you may vomit (nausea). Vomiting. Seizure. These symptoms may be an emergency. Get help right away. Call 911. Do not wait to see if the symptoms will go away. Do not drive yourself to the hospital. Also, get help right away if: You have strong feelings of sadness or loss of hope, or you have thoughts about hurting yourself or others. Take one of these steps if you feel like you may hurt yourself or others, or have thoughts about taking your own life:   Go to your nearest emergency room. Call 911. Call the National Suicide Prevention Lifeline at 1-800-273-8255 or 988. This is open 24 hours a day. Text the Crisis Text Line at 741741. Summary Proper care and treatment can help manage the effects of sickle cell disease so you can feel good and lead an active life. The goals of treatment are to control your symptoms and prevent and treat problems. Taking an active role in managing your condition can help you feel more in control of your situation. Work with your health care provider to create a pain management plan that works for you. Get medical help right away as told by your health care  provider. This information is not intended to replace advice given to you by your health care provider. Make sure you discuss any questions you have with your health care provider. Document Revised: 02/20/2022 Document Reviewed: 02/20/2022 Elsevier Patient Education  2023 Elsevier Inc.  

## 2022-07-25 NOTE — Progress Notes (Signed)
[name]    Subjective   Patient ID: Jerry [name], male    DOB: Jan 15, 1986  Age: 36 y.o. MRN: [id number]  Chief Complaint  Patient presents with   Follow-up    Pt is here for 3 month's SCD follow up visit.    [name] is a very pleasant 36 year old male with a medical history significant for sickle cell disease, vitamin D deficiency, opiate dependence and tolerance, and anemia of chronic disease.  Patient has been managing sickle cell disease well at home.  He has very well-controlled sickle cell disease with infrequent pain crises.  He typically has chronic pain that is located in low back and lower extremities.  Patient is not having pain on today.  He last had pain medication on yesterday with maximum relief.  Jerry [name] is mostly up-to-date with vaccinations, he declined influenza, and is inquiring about COVID-19 vaccination.  Patient has no new complaints on today.    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 Social History   Tobacco Use   Smoking status: Every Day   Smokeless tobacco: Never  Vaping Use   Vaping Use: Some days  Substance Use Topics   Alcohol use: No    Comment: rare   Drug use:  Yes    Types: Marijuana    Comment: occasionally   Social History   Socioeconomic History   Marital status: Single    Spouse name: Not on file   Number of children: Not on file   Years of education: Not on file   Highest education level: Not on file  Occupational History   Not on file  Tobacco Use   Smoking status: Every Day   Smokeless tobacco: Never  Vaping Use   Vaping Use: Some days  Substance and Sexual Activity   Alcohol use: No    Comment: rare   Drug use: Yes    Types: Marijuana    Comment: occasionally   Sexual activity: Yes    Birth control/protection: Condom  Other Topics Concern   Not on file  Social History Narrative   Not on file   Social Determinants of Health   Financial Resource Strain: Not on file  Food Insecurity: Not on file  Transportation Needs: Not on file  Physical Activity: Not on file  Stress: Not on file  Social Connections: Not on file  Intimate Partner Violence: Not on file   Family Status  Relation Name Status   Mother  Alive   Father  Alive   Sister  Alive   Sister  Alive   Family History  Problem Relation Age of Onset   Sickle cell trait Mother    No Known  Allergies    Review of Systems  HENT: Negative.    Eyes: Negative.   Respiratory: Negative.    Cardiovascular: Negative.   Gastrointestinal: Negative.   Genitourinary: Negative.   Musculoskeletal:  Positive for back pain and joint pain.  Skin: Negative.   Neurological: Negative.   Psychiatric/Behavioral: Negative.        Objective:     BP 124/79 (BP Location: Left Arm, Patient Position: Sitting, Cuff Size: Normal)   Pulse 75   Temp 98 F (36.7 C)   Ht '5\' 11"'  (1.803 m)   Wt 169 lb 12.8 oz (77 kg)   SpO2 100%   BMI 23.68 kg/m  BP Readings from Last 3 Encounters:  07/25/22 124/79  05/16/22 114/66  02/15/22 119/81   Wt Readings from Last 3 Encounters:  07/25/22 169 lb 12.8 oz (77 kg)  05/16/22 170 lb 4 oz (77.2 kg)  02/15/22 175 lb 3.2 oz (79.5 kg)       Physical Exam Constitutional:      Appearance: Normal appearance.  Eyes:     Pupils: Pupils are equal, round, and reactive to light.  Cardiovascular:     Rate and Rhythm: Normal rate and regular rhythm.     Pulses: Normal pulses.  Pulmonary:     Effort: Pulmonary effort is normal.  Abdominal:     General: Bowel sounds are normal.  Musculoskeletal:        General: Normal range of motion.  Skin:    General: Skin is warm.  Neurological:     General: No focal deficit present.     Mental Status: He is alert. Mental status is at baseline.  Psychiatric:        Mood and Affect: Mood normal.        Behavior: Behavior normal.        Thought Content: Thought content normal.        Judgment: Judgment normal.     No results found for any visits on 07/25/22.  Last CBC Lab Results  Component Value Date   WBC 10.0 05/16/2022   HGB 8.3 (L) 05/16/2022   HCT 22.8 (L) 05/16/2022   MCV 95 05/16/2022   MCH 34.6 (H) 05/16/2022   RDW 21.5 (H) 05/16/2022   PLT 332 01/75/1025   Last metabolic panel Lab Results  Component Value Date   GLUCOSE 94 05/16/2022   NA 139 05/16/2022   K 4.3 05/16/2022   CL 104 05/16/2022   CO2 21 05/16/2022   BUN 10 05/16/2022   CREATININE 0.96 05/16/2022   EGFR 107 05/16/2022   CALCIUM 9.4 05/16/2022   PROT 8.6 (H) 05/16/2022   ALBUMIN 5.0 05/16/2022   LABGLOB 3.6 05/16/2022   AGRATIO 1.4 05/16/2022   BILITOT 3.2 (H) 05/16/2022   ALKPHOS 83 05/16/2022   AST 47 (H) 05/16/2022   ALT 23 05/16/2022   ANIONGAP 5 03/05/2021   Last lipids Lab Results  Component Value Date   CHOL 130 01/09/2017   HDL 46 01/09/2017   LDLCALC 68 01/09/2017   TRIG 81 01/09/2017   CHOLHDL 2.8 01/09/2017   Last hemoglobin A1c No results found for: "HGBA1C" Last thyroid functions Lab Results  Component Value Date   TSH 0.809 07/04/2019   T4TOTAL 8.1 10/17/2018   Last vitamin D Lab Results  Component Value Date   VD25OH 23.9 (L) 05/16/2022   Last vitamin  B12 and Folate Lab Results  Component Value Date   FOLATE 13.7 05/16/2022  The ASCVD Risk score (Arnett DK, et al., 2019) failed to calculate for the following reasons:   The 2019 ASCVD risk score is only valid for ages 40 to 20    Assessment & Plan:   Problem List Items Addressed This Visit       Other   Hb-SS disease without crisis ([place]) - Primary   Relevant Orders   Sickle Cell Panel   Chronic prescription opiate use   Relevant Orders   Sickle Cell Panel   Other Visit Diagnoses     Major depressive disorder, remission status unspecified, unspecified whether recurrent          1. Hb-SS disease without crisis [facility]) Patient has very well-controlled sickle cell disease and no medication changes are warranted.  Discussed folic acid at length and the importance of taking medications consistently in order to receive positive outcomes.  Patient expressed understanding. - Sickle Cell Panel - Oxycodone HCl 10 MG TABS; Take 1 tablet by mouth every 4 hours as needed (up to 15 days).  Dispense: 90 tablet; Refill: 0  2. Chronic prescription opiate use Reviewed PDMP substance reporting system prior to prescribing opiate medications. No inconsistencies noted.   - Sickle Cell Panel - 161096 11+Oxyco+Alc+Crt-Bund - Oxycodone HCl 10 MG TABS; Take 1 tablet by mouth every 4 hours as needed (up to 15 days).  Dispense: 90 tablet; Refill: 0 3. Vitamin D deficiency Reviewed previous labs, vitamin D markedly decreased.  Patient reports that he has not been taking vitamin D consistently.  We will recheck levels today. - Vitamin D, Ergocalciferol, (DRISDOL) 1.25 MG (50000 UNIT) CAPS capsule; TAKE 1 CAPSULE BY MOUTH EVERY 7 (SEVEN) DAYS.  Dispense: 12 capsule; Refill: 3   Return in about 3 months (around 10/25/2022) for sickle cell anemia.    Donia Pounds  APRN, MSN, FNP-C Patient [place] [address] [place], [place] [address] [phone]

## 2022-07-26 ENCOUNTER — Other Ambulatory Visit: Payer: Self-pay

## 2022-07-26 ENCOUNTER — Other Ambulatory Visit (HOSPITAL_COMMUNITY): Payer: Self-pay

## 2022-07-26 ENCOUNTER — Other Ambulatory Visit: Payer: Self-pay | Admitting: Family Medicine

## 2022-07-26 ENCOUNTER — Telehealth: Payer: Self-pay | Admitting: Family Medicine

## 2022-07-26 DIAGNOSIS — D571 Sickle-cell disease without crisis: Secondary | ICD-10-CM

## 2022-07-26 DIAGNOSIS — Z79891 Long term (current) use of opiate analgesic: Secondary | ICD-10-CM

## 2022-07-26 LAB — CMP14+CBC/D/PLT+FER+RETIC+V...
ALT: 15 IU/L (ref 0–44)
AST: 39 IU/L (ref 0–40)
Albumin/Globulin Ratio: 1.8 (ref 1.2–2.2)
Albumin: 5.1 g/dL (ref 4.1–5.1)
Alkaline Phosphatase: 78 IU/L (ref 44–121)
BUN/Creatinine Ratio: 8 — ABNORMAL LOW (ref 9–20)
BUN: 7 mg/dL (ref 6–20)
Basophils Absolute: 0 10*3/uL (ref 0.0–0.2)
Basos: 0 %
Bilirubin Total: 3.3 mg/dL — ABNORMAL HIGH (ref 0.0–1.2)
CO2: 21 mmol/L (ref 20–29)
Calcium: 9.5 mg/dL (ref 8.7–10.2)
Chloride: 103 mmol/L (ref 96–106)
Creatinine, Ser: 0.85 mg/dL (ref 0.76–1.27)
EOS (ABSOLUTE): 0.3 10*3/uL (ref 0.0–0.4)
Eos: 3 %
Ferritin: 106 ng/mL (ref 30–400)
Globulin, Total: 2.9 g/dL (ref 1.5–4.5)
Glucose: 82 mg/dL (ref 70–99)
Hematocrit: 24.2 % — ABNORMAL LOW (ref 37.5–51.0)
Hemoglobin: 8.4 g/dL — ABNORMAL LOW (ref 13.0–17.7)
Immature Grans (Abs): 0 10*3/uL (ref 0.0–0.1)
Immature Granulocytes: 0 %
Lymphocytes Absolute: 2.9 10*3/uL (ref 0.7–3.1)
Lymphs: 32 %
MCH: 33.9 pg — ABNORMAL HIGH (ref 26.6–33.0)
MCHC: 34.7 g/dL (ref 31.5–35.7)
MCV: 98 fL — ABNORMAL HIGH (ref 79–97)
Monocytes Absolute: 1.2 10*3/uL — ABNORMAL HIGH (ref 0.1–0.9)
Monocytes: 13 %
NRBC: 2 % — ABNORMAL HIGH (ref 0–0)
Neutrophils Absolute: 4.8 10*3/uL (ref 1.4–7.0)
Neutrophils: 52 %
Platelets: 331 10*3/uL (ref 150–450)
Potassium: 4.4 mmol/L (ref 3.5–5.2)
RBC: 2.48 x10E6/uL — CL (ref 4.14–5.80)
RDW: 22.3 % — ABNORMAL HIGH (ref 11.6–15.4)
Retic Ct Pct: 16.8 % — ABNORMAL HIGH (ref 0.6–2.6)
Sodium: 139 mmol/L (ref 134–144)
Total Protein: 8 g/dL (ref 6.0–8.5)
Vit D, 25-Hydroxy: 22.5 ng/mL — ABNORMAL LOW (ref 30.0–100.0)
WBC: 9.3 10*3/uL (ref 3.4–10.8)
eGFR: 118 mL/min/{1.73_m2} (ref >59)

## 2022-07-26 MED ORDER — OXYCODONE HCL 10 MG PO TABS: 10.0000 mg | ORAL_TABLET | ORAL | 0 refills | Status: DC | PRN

## 2022-07-26 NOTE — Progress Notes (Signed)
Jerry [name] is a 36 year old male with a history of sickle cell disease that was evaluated in clinic on 07/25/2022.  This found the patient's vitamin D level remains decreased.  He will need to continue weekly Drisdol 50,000 IUs, which has been sent to his pharmacy.  Also inform patient that [facility] [address].  Nolon Nations  APRN, MSN, FNP-C Patient Care [facility] [address] [place], [place] [address] [phone]

## 2022-07-26 NOTE — Telephone Encounter (Signed)
PA started Confirmation #:[id number] W  Waiting on insurance approval

## 2022-07-26 NOTE — Progress Notes (Signed)
Meds ordered this encounter  Medications   Oxycodone HCl 10 MG TABS    Sig: Take 1 tablet by mouth every 4 hours as needed (up to 15 days).    Dispense:  90 tablet    Refill:  0    Order Specific Question:   Supervising Provider    Answer:   Quentin Angst [[id number]]   Sent to another pharmacy due to issues with supply.   Nolon Nations  APRN, MSN, FNP-C Patient Care [facility] [address] [place], [place] [address] [phone]

## 2022-07-27 ENCOUNTER — Other Ambulatory Visit: Payer: Self-pay | Admitting: Family Medicine

## 2022-07-27 ENCOUNTER — Other Ambulatory Visit: Payer: Self-pay

## 2022-07-27 ENCOUNTER — Other Ambulatory Visit (HOSPITAL_COMMUNITY): Payer: Self-pay

## 2022-07-27 DIAGNOSIS — D571 Sickle-cell disease without crisis: Secondary | ICD-10-CM

## 2022-07-27 DIAGNOSIS — Z79891 Long term (current) use of opiate analgesic: Secondary | ICD-10-CM

## 2022-07-27 MED ORDER — OXYCODONE HCL 10 MG PO TABS: 10.0000 mg | ORAL_TABLET | ORAL | 0 refills | Status: DC | PRN

## 2022-07-27 NOTE — Telephone Encounter (Signed)
Pt said that he had left a my chart message [facility] [address] [facility]one

## 2022-07-27 NOTE — Telephone Encounter (Signed)
Called patient about results.   He states the [facility] does not have it in stock. The only pharmacy that [facility] have Rx in [facility] [address] Thanks

## 2022-07-27 NOTE — Progress Notes (Signed)
Reviewed PDMP substance reporting system prior to prescribing opiate medications. No inconsistencies noted.  Meds ordered this encounter  Medications   Oxycodone HCl 10 MG TABS    Sig: Take 1 tablet by mouth every 4 hours as needed (up to 15 days).    Dispense:  90 tablet    Refill:  0    Order Specific Question:   Supervising Provider    Answer:   JEGEDE, OLUGBEMIGA E [[id number]]   Jerry Zapien Moore Emilya Justen  APRN, MSN, FNP-C Patient Care [facility] [address]  [place], [place] [address] [phone]  

## 2022-07-28 ENCOUNTER — Other Ambulatory Visit: Payer: Self-pay | Admitting: Family Medicine

## 2022-08-01 ENCOUNTER — Other Ambulatory Visit (HOSPITAL_COMMUNITY): Payer: Self-pay

## 2022-08-02 LAB — DRUG SCREEN 764883 11+OXYCO+ALC+CRT-BUND
Amphetamines, Urine: NEGATIVE ng/mL
BENZODIAZ UR QL: NEGATIVE ng/mL
Barbiturate: NEGATIVE ng/mL
Cocaine (Metabolite): NEGATIVE ng/mL
Creatinine: 113.7 mg/dL (ref 20.0–300.0)
Ethanol: NEGATIVE %
Meperidine: NEGATIVE ng/mL
Methadone Screen, Urine: NEGATIVE ng/mL
OPIATE SCREEN URINE: NEGATIVE ng/mL
Phencyclidine: NEGATIVE ng/mL
Propoxyphene: NEGATIVE ng/mL
Tramadol: NEGATIVE ng/mL
pH, Urine: 5.5 (ref 4.5–8.9)

## 2022-08-02 LAB — OXYCODONE/OXYMORPHONE, CONFIRM
OXYCODONE/OXYMORPH: POSITIVE — AB
OXYCODONE: NEGATIVE
OXYMORPHONE (GC/MS): 1001 ng/mL
OXYMORPHONE: POSITIVE — AB

## 2022-08-02 LAB — CANNABINOID CONFIRMATION, UR
CANNABINOIDS: POSITIVE — AB
Carboxy THC GC/MS Conf: 255 ng/mL

## 2022-08-10 ENCOUNTER — Other Ambulatory Visit: Payer: Self-pay | Admitting: Family Medicine

## 2022-08-10 DIAGNOSIS — Z79891 Long term (current) use of opiate analgesic: Secondary | ICD-10-CM

## 2022-08-10 DIAGNOSIS — D571 Sickle-cell disease without crisis: Secondary | ICD-10-CM

## 2022-08-10 MED ORDER — OXYCODONE HCL 10 MG PO TABS: 10.0000 mg | ORAL_TABLET | ORAL | 0 refills | Status: DC | PRN

## 2022-08-10 NOTE — Progress Notes (Signed)
Reviewed PDMP substance reporting system prior to prescribing opiate medications. No inconsistencies noted.  Meds ordered this encounter  Medications   Oxycodone HCl 10 MG TABS    Sig: Take 1 tablet by mouth every 4 hours as needed (up to 15 days).    Dispense:  90 tablet    Refill:  0    Order Specific Question:   Supervising Provider    Answer:   JEGEDE, OLUGBEMIGA E [[id number]]   Jerry Delosreyes Moore Ryliegh Mcduffey  APRN, MSN, FNP-C Patient Care [facility] [address]  [place], [place] [address] [phone]  

## 2022-08-11 ENCOUNTER — Other Ambulatory Visit: Payer: Self-pay | Admitting: Family Medicine

## 2022-08-12 ENCOUNTER — Other Ambulatory Visit (HOSPITAL_COMMUNITY): Payer: Self-pay

## 2022-08-15 ENCOUNTER — Other Ambulatory Visit (HOSPITAL_COMMUNITY): Payer: Self-pay

## 2022-08-17 ENCOUNTER — Ambulatory Visit: Payer: Medicaid Other | Admitting: Nurse Practitioner

## 2022-08-23 ENCOUNTER — Other Ambulatory Visit (HOSPITAL_COMMUNITY): Payer: Self-pay

## 2022-08-25 ENCOUNTER — Other Ambulatory Visit: Payer: Self-pay | Admitting: Family Medicine

## 2022-08-25 ENCOUNTER — Other Ambulatory Visit (HOSPITAL_COMMUNITY): Payer: Self-pay

## 2022-08-25 DIAGNOSIS — D571 Sickle-cell disease without crisis: Secondary | ICD-10-CM

## 2022-08-25 DIAGNOSIS — Z79891 Long term (current) use of opiate analgesic: Secondary | ICD-10-CM

## 2022-08-25 MED ORDER — OXYCODONE HCL 10 MG PO TABS: 10.0000 mg | ORAL_TABLET | ORAL | 0 refills | Status: DC | PRN

## 2022-08-25 NOTE — Progress Notes (Signed)
Reviewed PDMP substance reporting system prior to prescribing opiate medications. No inconsistencies noted.  Meds ordered this encounter  Medications   Oxycodone HCl 10 MG TABS    Sig: Take 1 tablet by mouth every 4 hours as needed (up to 15 days).    Dispense:  90 tablet    Refill:  0    Order Specific Question:   Supervising Provider    Answer:   JEGEDE, OLUGBEMIGA E [[id number]]   Day Deery Moore Monasia Lair  APRN, MSN, FNP-C [facility] [address]  [place], [place] [address] [phone]  

## 2022-08-26 ENCOUNTER — Other Ambulatory Visit (HOSPITAL_COMMUNITY): Payer: Self-pay

## 2022-08-28 ENCOUNTER — Other Ambulatory Visit: Payer: Self-pay

## 2022-09-07 ENCOUNTER — Other Ambulatory Visit (HOSPITAL_COMMUNITY): Payer: Self-pay

## 2022-09-11 ENCOUNTER — Other Ambulatory Visit: Payer: Self-pay | Admitting: Family Medicine

## 2022-09-11 DIAGNOSIS — Z79891 Long term (current) use of opiate analgesic: Secondary | ICD-10-CM

## 2022-09-11 DIAGNOSIS — D571 Sickle-cell disease without crisis: Secondary | ICD-10-CM

## 2022-09-11 MED ORDER — OXYCODONE HCL 10 MG PO TABS: 10.0000 mg | ORAL_TABLET | ORAL | 0 refills | Status: DC | PRN

## 2022-09-11 NOTE — Progress Notes (Signed)
Reviewed PDMP substance reporting system prior to prescribing opiate medications. No inconsistencies noted.  Meds ordered this encounter  Medications   Oxycodone HCl 10 MG TABS    Sig: Take 1 tablet by mouth every 4 hours as needed (up to 15 days).    Dispense:  90 tablet    Refill:  0    Order Specific Question:   Supervising Provider    Answer:   JEGEDE, OLUGBEMIGA E [[id number]]   Jerry [name] Jerry Yeagle  APRN, MSN, FNP-C Patient Care Center [facility] [address]  [place], [place] [address] [phone]  

## 2022-09-26 ENCOUNTER — Other Ambulatory Visit: Payer: Self-pay | Admitting: Family Medicine

## 2022-09-26 DIAGNOSIS — D571 Sickle-cell disease without crisis: Secondary | ICD-10-CM

## 2022-09-26 DIAGNOSIS — Z79891 Long term (current) use of opiate analgesic: Secondary | ICD-10-CM

## 2022-09-26 MED ORDER — OXYCODONE HCL 10 MG PO TABS: 10.0000 mg | ORAL_TABLET | ORAL | 0 refills | Status: DC | PRN

## 2022-09-26 NOTE — Telephone Encounter (Signed)
Reviewed PDMP substance reporting system prior to prescribing opiate medications. No inconsistencies noted.  Meds ordered this encounter  Medications   Oxycodone HCl 10 MG TABS    Sig: Take 1 tablet by mouth every 4 hours as needed (up to 15 days).    Dispense:  90 tablet    Refill:  0    Order Specific Question:   Supervising Provider    Answer:   JEGEDE, OLUGBEMIGA E [[id number]]   Melodi Happel Moore Ercie Eliasen  APRN, MSN, FNP-C Patient Care Center [facility] [address]  [place], [place] [address] [phone]  

## 2022-10-12 ENCOUNTER — Other Ambulatory Visit: Payer: Self-pay | Admitting: Family Medicine

## 2022-10-12 DIAGNOSIS — Z79891 Long term (current) use of opiate analgesic: Secondary | ICD-10-CM

## 2022-10-12 DIAGNOSIS — D571 Sickle-cell disease without crisis: Secondary | ICD-10-CM

## 2022-10-12 MED ORDER — OXYCODONE HCL 10 MG PO TABS: 10.0000 mg | ORAL_TABLET | ORAL | 0 refills | Status: DC | PRN

## 2022-10-12 NOTE — Telephone Encounter (Signed)
Reviewed PDMP substance reporting system prior to prescribing opiate medications. No inconsistencies noted.  Meds ordered this encounter  Medications   Oxycodone HCl 10 MG TABS    Sig: Take 1 tablet by mouth every 4 hours as needed (up to 15 days).    Dispense:  90 tablet    Refill:  0    Order Specific Question:   Supervising Provider    Answer:   JEGEDE, OLUGBEMIGA E [[id number]]   Jerry Lumbra Moore Ida Uppal  APRN, MSN, FNP-C Patient Care [facility] [address]  [place], [place] [address] [phone]  

## 2022-10-12 NOTE — Telephone Encounter (Signed)
From: [name] To: Office of Julianne Handler, [place] Sent: 10/12/2022 8:59 AM EST Subject: Medication Renewal Request  Refills have been requested for the following medications:   Oxycodone HCl 10 MG TABS [[place]]  [id number] [place], [place] - [address] Delivery method: [employer]

## 2022-10-24 ENCOUNTER — Other Ambulatory Visit: Payer: Self-pay | Admitting: Family Medicine

## 2022-10-24 ENCOUNTER — Encounter: Payer: Self-pay | Admitting: Family Medicine

## 2022-10-24 ENCOUNTER — Ambulatory Visit (INDEPENDENT_AMBULATORY_CARE_PROVIDER_SITE_OTHER): Payer: Medicaid Other | Admitting: Family Medicine

## 2022-10-24 VITALS — BP 105/57 | HR 70 | Temp 98.6°F | Ht 71.0 in | Wt 176.4 lb

## 2022-10-24 DIAGNOSIS — E559 Vitamin D deficiency, unspecified: Secondary | ICD-10-CM

## 2022-10-24 DIAGNOSIS — Z79891 Long term (current) use of opiate analgesic: Secondary | ICD-10-CM | POA: Diagnosis not present

## 2022-10-24 DIAGNOSIS — Z13220 Encounter for screening for lipoid disorders: Secondary | ICD-10-CM

## 2022-10-24 DIAGNOSIS — D571 Sickle-cell disease without crisis: Secondary | ICD-10-CM | POA: Diagnosis not present

## 2022-10-24 MED ORDER — HYDROXYUREA 500 MG PO CAPS: 1500.0000 mg | ORAL_CAPSULE | Freq: Every day | ORAL | 6 refills | Status: DC

## 2022-10-24 MED ORDER — FOLIC ACID 1 MG PO TABS: 1.0000 mg | ORAL_TABLET | Freq: Every day | ORAL | 3 refills | Status: AC

## 2022-10-24 MED ORDER — OXYCODONE HCL 10 MG PO TABS: 10.0000 mg | ORAL_TABLET | ORAL | 0 refills | Status: DC | PRN

## 2022-10-24 NOTE — Progress Notes (Signed)
Established Patient Office Visit  Subjective   Patient ID: Jerry [name], male    DOB: [birth year]  Age: 36 y.o. MRN: [id number]  Chief Complaint  Patient presents with   Follow-up    Sickle cell    Scottie Mizell is a 36 year old male with a medical history significant for sickle cell disease, chronic pain syndrome, opiate dependence and tolerance, and anemia of chronic disease that presents for 97-monthfollow-up of chronic conditions.  Patient states that he has been doing well and is without complaint on today. Patient's last sickle cell crisis was 2 weeks ago, he managed at home with his home opiates that consist of oxycodone.  Patient also used ibuprofen to assist with sickle cell crisis control.  He is not having any pain at this time.  He denies any headache, blurry vision, chest pain, urinary symptoms, nausea, vomiting, or diarrhea.    Patient Active Problem List   Diagnosis Date Noted   Acute respiratory failure with hypoxia ([place] 07/25/2022   Urine test positive for microalbuminuria 06/17/2020   Hematuria with proteinuria 04/21/2020   Sickle cell crisis ([place] 10/08/2019   Chronic prescription opiate use 10/07/2019   Persistent proteinuria 10/07/2019   Chest discomfort 10/07/2019   Sickle cell anemia ([place] 11/19/2018   Numbness and tingling of right face    Leukocytosis    Sickle cell pain crisis ([facility] 11/11/2017   Vitamin D deficiency 04/26/2017   Problems related to release from prison 01/09/2017   Hb-SS disease without crisis ([facility] 01/09/2017   TOBACCO ABUSE 02/15/2007   MARIJUANA ABUSE 02/15/2007   GALLBLADDER DISEASE 02/07/2007   Past Medical History:  Diagnosis Date   Sickle cell anemia ([place]    No past surgical history on file. Social History   Tobacco Use   Smoking status: Every Day   Smokeless tobacco: Never  Vaping Use   Vaping Use: Some days  Substance Use Topics   Alcohol use: No    Comment: rare   Drug use: Yes    Types: Marijuana     Comment: occasionally   Social History   Socioeconomic History   Marital status: Single    Spouse name: Not on file   Number of children: Not on file   Years of education: Not on file   Highest education level: Not on file  Occupational History   Not on file  Tobacco Use   Smoking status: Every Day   Smokeless tobacco: Never  Vaping Use   Vaping Use: Some days  Substance and Sexual Activity   Alcohol use: No    Comment: rare   Drug use: Yes    Types: Marijuana    Comment: occasionally   Sexual activity: Yes    Birth control/protection: Condom  Other Topics Concern   Not on file  Social History Narrative   Not on file   Social Determinants of Health   Financial Resource Strain: Not on file  Food Insecurity: Not on file  Transportation Needs: Not on file  Physical Activity: Not on file  Stress: Not on file  Social Connections: Not on file  Intimate Partner Violence: Not on file   Family Status  Relation Name Status   Mother  Alive   Father  Alive   Sister  Alive   Sister  Alive   Family History  Problem Relation Age of Onset   Sickle cell trait Mother    No Known Allergies    Review of Systems  Constitutional: Negative.  HENT: Negative.    Eyes: Negative.   Respiratory: Negative.    Cardiovascular: Negative.   Gastrointestinal: Negative.   Genitourinary: Negative.   Musculoskeletal:  Positive for back pain and joint pain.  Skin: Negative.   Neurological: Negative.   Psychiatric/Behavioral: Negative.        Objective:     BP (!) 105/57   Pulse 70   Temp 98.6 F (37 C)   Ht _0  (1.803 m)   Wt 176 lb 6.4 oz (80 kg)   SpO2 97%   BMI 24.60 kg/m  BP Readings from Last 3 Encounters:  10/24/22 (!) 105/57  07/25/22 124/79  05/16/22 114/66   Wt Readings from Last 3 Encounters:  10/24/22 176 lb 6.4 oz (80 kg)  07/25/22 169 lb 12.8 oz (77 kg)  05/16/22 170 lb 4 oz (77.2 kg)      Physical Exam Eyes:     Pupils: Pupils are equal,  round, and reactive to light.  Cardiovascular:     Rate and Rhythm: Normal rate and regular rhythm.     Pulses: Normal pulses.  Pulmonary:     Effort: Pulmonary effort is normal.  Abdominal:     General: Bowel sounds are normal.  Skin:    General: Skin is warm.  Neurological:     General: No focal deficit present.     Mental Status: Mental status is at baseline.  Psychiatric:        Mood and Affect: Mood normal.        Behavior: Behavior normal.        Thought Content: Thought content normal.        Judgment: Judgment normal.    No results found for any visits on 10/24/22.  Last CBC Lab Results  Component Value Date   WBC 9.3 07/25/2022   HGB 8.4 (L) 07/25/2022   HCT 24.2 (L) 07/25/2022   MCV 98 (H) 07/25/2022   MCH 33.9 (H) 07/25/2022   RDW 22.3 (H) 07/25/2022   PLT 331 96/02/5408   Last metabolic panel Lab Results  Component Value Date   GLUCOSE 82 07/25/2022   NA 139 07/25/2022   K 4.4 07/25/2022   CL 103 07/25/2022   CO2 21 07/25/2022   BUN 7 07/25/2022   CREATININE 0.85 07/25/2022   EGFR 118 07/25/2022   CALCIUM 9.5 07/25/2022   PROT 8.0 07/25/2022   ALBUMIN 5.1 07/25/2022   LABGLOB 2.9 07/25/2022   AGRATIO 1.8 07/25/2022   BILITOT 3.3 (H) 07/25/2022   ALKPHOS 78 07/25/2022   AST 39 07/25/2022   ALT 15 07/25/2022   ANIONGAP 5 03/05/2021   Last lipids Lab Results  Component Value Date   CHOL 130 01/09/2017   HDL 46 01/09/2017   LDLCALC 68 01/09/2017   TRIG 81 01/09/2017   CHOLHDL 2.8 01/09/2017   Last hemoglobin A1c No results found for: "HGBA1C" Last thyroid functions Lab Results  Component Value Date   TSH 0.809 07/04/2019   T4TOTAL 8.1 10/17/2018   Last vitamin D Lab Results  Component Value Date   VD25OH 22.5 (L) 07/25/2022   Last vitamin B12 and Folate Lab Results  Component Value Date   FOLATE 13.7 05/16/2022      The ASCVD Risk score (Arnett DK, et al., 2019) failed to calculate for the following reasons:   The 2019 ASCVD  risk score is only valid for ages 61 to 61    Assessment & Plan:   Problem List Items Addressed This Visit  Other   Hb-SS disease without crisis [facility])   Relevant Orders   833582 11+Oxyco+Alc+Crt-Bund   Chronic prescription opiate use - Primary   Relevant Orders   [id number] 11+Oxyco+Alc+Crt-Bund  1. Chronic prescription opiate use Reviewed PDMP substance reporting system prior to prescribing opiate medications. No inconsistencies noted.   - [id number] 11+Oxyco+Alc+Crt-Bund - Oxycodone HCl 10 MG TABS; Take 1 tablet by mouth every 4 hours as needed (up to 15 days).  Dispense: 90 tablet; Refill: 0  2. Hb-SS disease without crisis ([place])  - [id number] 11+Oxyco+Alc+Crt-Bund - hydroxyurea (HYDREA) 500 MG capsule; Take 3 capsules (1,500 mg total) by mouth daily. May take with food to minimize GI side effects.  Dispense: 90 capsule; Refill: 6 - folic acid (FOLVITE) 1 MG tablet; Take 1 tablet (1 mg total) by mouth daily.  Dispense: 90 tablet; Refill: 3 - Oxycodone HCl 10 MG TABS; Take 1 tablet by mouth every 4 hours as needed (up to 15 days).  Dispense: 90 tablet; Refill: 0 - Sickle Cell Panel  3. Vitamin D deficiency  - Sickle Cell Panel 4. Screening for cholesterol level  - Lipid Panel - Sickle Cell Panel  Return in about 3 months (around 01/24/2023) for sickle cell anemia.  Donia Pounds  APRN, MSN, FNP-C Patient [facility] [address] [place], [place] [address] [phone]

## 2022-10-24 NOTE — Patient Instructions (Signed)
Living With Sickle Cell Disease Living with a long-term condition, such as sickle cell disease, can be a challenge. It can affect both your physical and mental health. You may not have total control over your condition. But proper care and treatment can help manage the effects of the disease so you can feel good and lead an active life. You can take steps to manage your condition and stay as healthy as possible. How does sickle cell disease affect me? Sickle cell disease can cause challenges that affect your quality of life. You may get sick more often as a result of organ damage and infections. Sometimes you may need to stay in the hospital. Learn how to recognize that you are not feeling well and that you may be getting sick. What actions can I take to manage my condition?  The goals of treatment are to control your symptoms and prevent and treat problems. Work with your health care provider to create a treatment plan that works for you. Taking an active role in managing your condition can help you feel more in control of your situation. Ask about possible side effects of medicines that your health care provider recommends. Discuss how you feel about having those side effects. Keeping a healthy lifestyle can help you manage your condition. This includes eating a healthy diet, getting enough sleep, and getting regular exercise. Sickle cell disease may affect your ability to take care of your basic needs. Tell your health care provider if you have concerns about any of these needs: Access to food. Housing. Safe drinking water and other utilities. Safety in your home and community. Work or school. Transportation. Paying for health care. Your health care provider may be able to connect you with community resources that can help you. How to manage stress  Living with sickle cell disease can be stressful. This disease can have a big impact on your mental health. Talk with your health care provider  about ways to reduce your stress or if you have concerns about your mental health.  To cope with stress, try: Keeping a stress diary. This can help you learn what causes your stress to start (figure out your triggers) and how to control your response to those triggers. Spending time doing things that you enjoy, such as: Hobbies. Being outdoors. Spending time with friends and people who make you laugh. Doing yoga, muscle relaxation, deep breathing, or mindfulness practices. Expressing yourself through journal writing, art, crafting, poetry, or playing music. Staying positive about your health. Try to accept that you cannot control your condition perfectly. Follow these instructions at home: Medicines Take over-the-counter and prescription medicines only as told by your health care provider. If you were prescribed antibiotics, take them as told by your health care provider. Do not stop taking them even if you start to feel better. If you develop a fever, do not take medicines to reduce the fever right away. This could cover up another problem. Contact your health care provider. Eating and drinking Drink enough fluid to keep your urine pale yellow. Drink more in hot weather and during exercise. Limit or avoid drinking alcohol. Eat a balanced and nutritious diet. Eat plenty of fruits, vegetables, whole grains, and lean protein. Take vitamins and supplements as told by your health care provider. Traveling When traveling, keep these with you: Your medical information. The names of your health care providers. Your medicines. If you have to travel by air, ask about precautions you should take. Managing pain Work with   your health care provider to create a pain management plan that works for you. The plan may include: Ways to reduce or manage your pain at home, such as: Using a heating pad. Taking a warm bath. Using healthy ways to distract you from the pain, such as hobbies or  reading. Practicing ways to relax, such as doing yoga or listening to music. Getting massages. Doing exercises or stretches as told by a physical therapist. Tracking how pain affects your daily life functions. When to seek help. Who to contact and what to do in case of a pain emergency. General instructions Do not use any products that contain nicotine or tobacco. These products include cigarettes, chewing tobacco, and vaping devices, such as e-cigarettes. These lower blood oxygen levels. If you need help quitting, ask your health care provider. Consider wearing a medical alert bracelet. Use an app or journal to track your symptoms, assess your level of pain and fatigue, and keep track of your medicines. Avoid the following: High altitudes. Very high or low temperatures and big changes in temperature. Activities that will lower your oxygen levels, such as mountain climbing or doing exercise that takes a lot of effort. Stay up to date on: Your treatment plan. Learn as much as you can about your condition. Health screenings. This will help prevent problems or catch them early on. Vaccines. This will help prevent infection. Wash your hands often with soap and water to help prevent infections. Wash them for at least 20 seconds each time. Keep all follow-up visits. Regular follow-up with your health care provider can help you better manage your condition. Where to find support You can find help and support through: Talking with a therapist or taking part in support groups. Sickle Cell Disease Foundation of [place]: www.sicklecelldisease.org Where to find more information Centers for Disease Control and Prevention: www.cdc.gov American Society of Hematology: www.hematology.org Contact a health care provider if: Your symptoms get worse. You have new symptoms. You have a fever. Get help right away if: You have a painful erection of the penis that lasts a long time (priapism). You become  short of breath or are having trouble breathing. You have pain that cannot be controlled with medicine. You have any signs of a stroke. "BE FAST" is an easy way to remember the main warning signs: B - Balance. Dizziness, sudden trouble walking, or loss of balance. E - Eyes. Trouble seeing or a change in how you see. F - Face. Sudden weakness or loss of feeling of the face. The face or eyelid may droop on one side. A - Arms. Weakness or loss of feeling in an arm. This happens all of a sudden and most often on one side of the body. S - Speech. Sudden trouble speaking, slurred speech, or trouble understanding what people say. T - Time. Time to call emergency services. Write down what time symptoms started. You have other signs of a stroke, such as: A sudden, very bad headache with no known cause. Feeling like you may vomit (nausea). Vomiting. Seizure. These symptoms may be an emergency. Get help right away. Call 911. Do not wait to see if the symptoms will go away. Do not drive yourself to the hospital. Also, get help right away if: You have strong feelings of sadness or loss of hope, or you have thoughts about hurting yourself or others. Take one of these steps if you feel like you may hurt yourself or others, or have thoughts about taking your own life:   Go to your nearest emergency room. Call 911. Call the National Suicide Prevention Lifeline at 1-800-273-8255 or 988. This is open 24 hours a day. Text the Crisis Text Line at 741741. Summary Proper care and treatment can help manage the effects of sickle cell disease so you can feel good and lead an active life. The goals of treatment are to control your symptoms and prevent and treat problems. Taking an active role in managing your condition can help you feel more in control of your situation. Work with your health care provider to create a pain management plan that works for you. Get medical help right away as told by your health care  provider. This information is not intended to replace advice given to you by your health care provider. Make sure you discuss any questions you have with your health care provider. Document Revised: 02/20/2022 Document Reviewed: 02/20/2022 Elsevier Patient Education  2023 Elsevier Inc.  

## 2022-10-25 LAB — CMP14+CBC/D/PLT+FER+RETIC+V...
ALT: 14 IU/L (ref 0–44)
AST: 37 IU/L (ref 0–40)
Albumin/Globulin Ratio: 1.4 (ref 1.2–2.2)
Albumin: 4.4 g/dL (ref 4.1–5.1)
Alkaline Phosphatase: 82 IU/L (ref 44–121)
BUN/Creatinine Ratio: 7 — ABNORMAL LOW (ref 9–20)
BUN: 6 mg/dL (ref 6–20)
Basophils Absolute: 0.1 10*3/uL (ref 0.0–0.2)
Basos: 1 %
Bilirubin Total: 2 mg/dL — ABNORMAL HIGH (ref 0.0–1.2)
CO2: 21 mmol/L (ref 20–29)
Calcium: 9.4 mg/dL (ref 8.7–10.2)
Chloride: 105 mmol/L (ref 96–106)
Creatinine, Ser: 0.92 mg/dL (ref 0.76–1.27)
EOS (ABSOLUTE): 0.3 10*3/uL (ref 0.0–0.4)
Eos: 3 %
Ferritin: 186 ng/mL (ref 30–400)
Globulin, Total: 3.1 g/dL (ref 1.5–4.5)
Glucose: 81 mg/dL (ref 70–99)
Hematocrit: 23.3 % — ABNORMAL LOW (ref 37.5–51.0)
Hemoglobin: 7.7 g/dL — ABNORMAL LOW (ref 13.0–17.7)
Immature Grans (Abs): 0.2 10*3/uL — ABNORMAL HIGH (ref 0.0–0.1)
Immature Granulocytes: 2 %
Lymphocytes Absolute: 3 10*3/uL (ref 0.7–3.1)
Lymphs: 28 %
MCH: 32.1 pg (ref 26.6–33.0)
MCHC: 33 g/dL (ref 31.5–35.7)
MCV: 97 fL (ref 79–97)
Monocytes Absolute: 1.5 10*3/uL — ABNORMAL HIGH (ref 0.1–0.9)
Monocytes: 14 %
NRBC: 2 % — ABNORMAL HIGH (ref 0–0)
Neutrophils Absolute: 5.9 10*3/uL (ref 1.4–7.0)
Neutrophils: 52 %
Platelets: 435 10*3/uL (ref 150–450)
Potassium: 5 mmol/L (ref 3.5–5.2)
RBC: 2.4 x10E6/uL — CL (ref 4.14–5.80)
RDW: 22.2 % — ABNORMAL HIGH (ref 11.6–15.4)
Retic Ct Pct: 17.9 % — ABNORMAL HIGH (ref 0.6–2.6)
Sodium: 139 mmol/L (ref 134–144)
Total Protein: 7.5 g/dL (ref 6.0–8.5)
Vit D, 25-Hydroxy: 17.8 ng/mL — ABNORMAL LOW (ref 30.0–100.0)
WBC: 11 10*3/uL — ABNORMAL HIGH (ref 3.4–10.8)
eGFR: 112 mL/min/{1.73_m2} (ref >59)

## 2022-10-25 LAB — LIPID PANEL
Chol/HDL Ratio: 2.4 ratio (ref 0.0–5.0)
Cholesterol, Total: 120 mg/dL (ref 100–199)
HDL: 49 mg/dL (ref >39)
LDL Chol Calc (NIH): 57 mg/dL (ref 0–99)
Triglycerides: 69 mg/dL (ref 0–149)
VLDL Cholesterol Cal: 14 mg/dL (ref 5–40)

## 2022-10-27 LAB — DRUG SCREEN 764883 11+OXYCO+ALC+CRT-BUND
Amphetamines, Urine: NEGATIVE ng/mL
BENZODIAZ UR QL: NEGATIVE ng/mL
Barbiturate: NEGATIVE ng/mL
Cocaine (Metabolite): NEGATIVE ng/mL
Creatinine: 100.9 mg/dL (ref 20.0–300.0)
Ethanol: NEGATIVE %
Meperidine: NEGATIVE ng/mL
Methadone Screen, Urine: NEGATIVE ng/mL
OPIATE SCREEN URINE: NEGATIVE ng/mL
Oxycodone/Oxymorphone, Urine: NEGATIVE ng/mL
Phencyclidine: NEGATIVE ng/mL
Propoxyphene: NEGATIVE ng/mL
Tramadol: NEGATIVE ng/mL
pH, Urine: 5.4 (ref 4.5–8.9)

## 2022-10-27 LAB — CANNABINOID CONFIRMATION, UR
CANNABINOIDS: POSITIVE — AB
Carboxy THC GC/MS Conf: 91 ng/mL

## 2022-11-10 ENCOUNTER — Other Ambulatory Visit: Payer: Self-pay | Admitting: Family Medicine

## 2022-11-10 ENCOUNTER — Other Ambulatory Visit: Payer: Self-pay

## 2022-11-10 DIAGNOSIS — D571 Sickle-cell disease without crisis: Secondary | ICD-10-CM

## 2022-11-10 DIAGNOSIS — Z79891 Long term (current) use of opiate analgesic: Secondary | ICD-10-CM

## 2022-11-10 MED ORDER — OXYCODONE HCL 10 MG PO TABS: 10.0000 mg | ORAL_TABLET | ORAL | 0 refills | Status: DC | PRN

## 2022-11-10 NOTE — Progress Notes (Signed)
Reviewed PDMP substance reporting system prior to prescribing opiate medications. No inconsistencies noted.  Meds ordered this encounter  Medications   Oxycodone HCl 10 MG TABS    Sig: Take 1 tablet by mouth every 4 hours as needed (up to 15 days).    Dispense:  90 tablet    Refill:  0    Order Specific Question:   Supervising Provider    Answer:   JEGEDE, OLUGBEMIGA E [[id number]]   Lielle Vandervort Moore Donella Pascarella  APRN, MSN, FNP-C Patient Care [facility] [address]  [place], [place] [address] [phone]  

## 2022-11-10 NOTE — Telephone Encounter (Signed)
From: [name] To: Office of Julianne Handler, [place] Sent: 11/10/2022 8:16 AM EST Subject: Medication Renewal Request  Refills have been requested for the following medications:   Oxycodone HCl 10 MG TABS [Lachina Hollis]  Preferred pharmacy: [id number] - [place], [place] - [address] Delivery method: [employer]

## 2022-11-28 ENCOUNTER — Other Ambulatory Visit: Payer: Self-pay | Admitting: Family Medicine

## 2022-11-28 DIAGNOSIS — Z79891 Long term (current) use of opiate analgesic: Secondary | ICD-10-CM

## 2022-11-28 DIAGNOSIS — D571 Sickle-cell disease without crisis: Secondary | ICD-10-CM

## 2022-11-28 NOTE — Telephone Encounter (Signed)
From: [facility] To: Office of Cammie Sickle, [place] Sent: 11/25/2022 10:48 AM EST Subject: Medication Renewal Request  Refills have been requested for the following medications:   Oxycodone HCl 10 MG TABS [Candela Krul]  Preferred pharmacy: [id number] [place], [place] Delivery method: [employer]

## 2022-11-28 NOTE — Telephone Encounter (Signed)
Jerry [name] is a 37 year old male with a medical history significant for sickle cell disease, chronic pain syndrome, opiate dependence and tolerance, and anemia of chronic disease.  Reviewed patient's most recent urine drug screen, no opiates detected.  Patient receives prescriptions for oxycodone around every 15 days.  According to [place], his last prescription was filled on 11/10/2022.  Patient is currently requesting a new prescription.  Notify patient, discussed at length.  He states that he takes his medication around every 4-6 hours and is unsure why he did not have any opiates in his system.  Patient advised to return to office within the next couple of days to repeat labs.   Orders Placed This Encounter  Procedures   [id number] 11+Oxyco+Alc+Crt-Bund    Standing Status:   Future    Standing Expiration Date:   11/29/2023    Donia Pounds  APRN, MSN, FNP-C Patient [facility] [address] [place], [place] [address] [phone]

## 2022-12-04 ENCOUNTER — Ambulatory Visit: Payer: Self-pay

## 2022-12-04 ENCOUNTER — Encounter: Payer: Self-pay | Admitting: Nurse Practitioner

## 2022-12-04 ENCOUNTER — Ambulatory Visit: Payer: Self-pay | Admitting: Nurse Practitioner

## 2022-12-04 ENCOUNTER — Other Ambulatory Visit: Payer: Self-pay | Admitting: Family Medicine

## 2022-12-04 DIAGNOSIS — Z79891 Long term (current) use of opiate analgesic: Secondary | ICD-10-CM

## 2022-12-04 NOTE — Progress Notes (Unsigned)
@Patient  ID: Jerry [name], male    DOB: Dec 29, 1985, 37 y.o.   MRN: [id number]  Chief Complaint  Patient presents with   Drug / Alcohol Assessment    Referring provider: Dorena Dew, FNP   HPI       No Known Allergies  Immunization History  Administered Date(s) Administered   HPV 9-valent 08/07/2018, 01/17/2019, 06/17/2019   Influenza,inj,Quad PF,6+ Mos 08/07/2018   Pneumococcal Polysaccharide-23 05/09/2018   Tdap 04/26/2017    Past Medical History:  Diagnosis Date   Sickle cell anemia ([place])     Tobacco History: Social History   Tobacco Use  Smoking Status Every Day  Smokeless Tobacco Never   Ready to quit: Not Answered Counseling given: Not Answered   Outpatient Encounter Medications as of 12/04/2022  Medication Sig   folic acid (FOLVITE) 1 MG tablet Take 1 tablet (1 mg total) by mouth daily.   hydroxyurea (HYDREA) 500 MG capsule Take 3 capsules (1,500 mg total) by mouth daily. May take with food to minimize GI side effects.   ibuprofen (ADVIL) 800 MG tablet Take 1 tablet, 3 times a day as needed.   lisinopril (ZESTRIL) 2.5 MG tablet Take 1 tablet (2.5 mg total) by mouth daily.   Oxycodone HCl 10 MG TABS Take 1 tablet by mouth every 4 hours as needed (up to 15 days).   promethazine (PHENERGAN) 12.5 MG tablet Take 1/2 tablet by mouth every 6 hours as needed for nausea or vomiting.   Vitamin D, Ergocalciferol, (DRISDOL) 1.25 MG (50000 UNIT) CAPS capsule TAKE 1 CAPSULE BY MOUTH EVERY 7 (SEVEN) DAYS.   No facility-administered encounter medications on file as of 12/04/2022.     Review of Systems  Review of Systems     Physical Exam  BP 129/79   Pulse 80   Ht 5\' 11"  (1.803 m)   Wt 176 lb (79.8 kg)   SpO2 97%   BMI 24.55 kg/m   Wt Readings from Last 5 Encounters:  12/04/22 176 lb (79.8 kg)  10/24/22 176 lb 6.4 oz (80 kg)  07/25/22 169 lb 12.8 oz (77 kg)  05/16/22 170 lb 4 oz (77.2 kg)  02/15/22 175 lb 3.2 oz (79.5 kg)     Physical  Exam   Lab Results:  CBC    Component Value Date/Time   WBC 11.0 (H) 10/24/2022 1327   WBC 13.2 (H) 03/05/2021 0133   RBC 2.40 (LL) 10/24/2022 1327   RBC 2.35 (L) 03/05/2021 0139   RBC 2.36 (L) 03/05/2021 0133   HGB 7.7 (L) 10/24/2022 1327   HCT 23.3 (L) 10/24/2022 1327   PLT 435 10/24/2022 1327   MCV 97 10/24/2022 1327   MCH 32.1 10/24/2022 1327   MCH 33.5 03/05/2021 0133   MCHC 33.0 10/24/2022 1327   MCHC 37.8 (H) 03/05/2021 0133   RDW 22.2 (H) 10/24/2022 1327   LYMPHSABS 3.0 10/24/2022 1327   MONOABS 2.2 (H) 03/05/2021 0133   EOSABS 0.3 10/24/2022 1327   BASOSABS 0.1 10/24/2022 1327    BMET    Component Value Date/Time   NA 139 10/24/2022 1327   K 5.0 10/24/2022 1327   CL 105 10/24/2022 1327   CO2 21 10/24/2022 1327   GLUCOSE 81 10/24/2022 1327   GLUCOSE 119 (H) 03/05/2021 0133   BUN 6 10/24/2022 1327   CREATININE 0.92 10/24/2022 1327   CREATININE 0.84 04/26/2017 0948   CALCIUM 9.4 10/24/2022 1327   GFRNONAA >60 03/05/2021 0133   GFRNONAA >89 04/26/2017 2025  GFRAA >60 07/05/2020 1211   GFRAA >89 04/26/2017 0948    BNP No results found for: "BNP"  ProBNP No results found for: "PROBNP"  Imaging: No results found.   Assessment & Plan:   No problem-specific Assessment & Plan notes found for this encounter.     Ivonne Andrew, NP 12/04/2022

## 2022-12-04 NOTE — Progress Notes (Signed)
Patient presented this a.m. to repeat UDS.  UDS review during previous primary care appointment, did not yield any opiates.  Patient is currently on oxycodone 10 mg every 4 hours and typically receives 90 tablets every 2 weeks.  Patient has many questions pertaining to why he will have to repeat his drug screen.  Discussed this matter at length.  Patient expressed understanding.  Will resume prescribing after reviewing urine drug screen results.  Donia Pounds  APRN, MSN, FNP-C Patient [facility] [address] [place], [place] [address] [phone]

## 2022-12-05 LAB — DRUG SCREEN 764883 11+OXYCO+ALC+CRT-BUND

## 2022-12-07 LAB — OXYCODONE/OXYMORPHONE, CONFIRM
OXYCODONE/OXYMORPH: POSITIVE — AB
OXYCODONE: 1242 ng/mL
OXYCODONE: POSITIVE — AB
OXYMORPHONE (GC/MS): 2006 ng/mL
OXYMORPHONE: POSITIVE — AB

## 2022-12-07 LAB — DRUG SCREEN 764883 11+OXYCO+ALC+CRT-BUND
Amphetamines, Urine: NEGATIVE ng/mL
BENZODIAZ UR QL: NEGATIVE ng/mL
Barbiturate: NEGATIVE ng/mL
Cocaine (Metabolite): NEGATIVE ng/mL
Creatinine: 83.2 mg/dL (ref 20.0–300.0)
Ethanol: NEGATIVE %
Meperidine: NEGATIVE ng/mL
Methadone Screen, Urine: NEGATIVE ng/mL
OPIATE SCREEN URINE: NEGATIVE ng/mL
Phencyclidine: NEGATIVE ng/mL
Propoxyphene: NEGATIVE ng/mL
Tramadol: NEGATIVE ng/mL
pH, Urine: 5.5 (ref 4.5–8.9)

## 2022-12-07 LAB — CANNABINOID CONFIRMATION, UR
CANNABINOIDS: POSITIVE — AB
Carboxy THC GC/MS Conf: 62 ng/mL

## 2022-12-08 ENCOUNTER — Other Ambulatory Visit: Payer: Self-pay | Admitting: Family Medicine

## 2022-12-08 DIAGNOSIS — D571 Sickle-cell disease without crisis: Secondary | ICD-10-CM

## 2022-12-08 DIAGNOSIS — Z79891 Long term (current) use of opiate analgesic: Secondary | ICD-10-CM

## 2022-12-08 MED ORDER — OXYCODONE HCL 10 MG PO TABS: 10.0000 mg | ORAL_TABLET | ORAL | 0 refills | Status: DC | PRN

## 2022-12-08 NOTE — Telephone Encounter (Signed)
From: Memory [facility] To: Office of Cammie Sickle, [place] Sent: 12/07/2022 7:33 PM EST Subject: Medication Renewal Request  Refills have been requested for the following medications:   Oxycodone HCl 10 MG TABS [Ryelan Kazee]  Preferred pharmacy: [id number] [place], [place] Delivery method: [employer]

## 2022-12-08 NOTE — Telephone Encounter (Signed)
Reviewed PDMP substance reporting system prior to prescribing opiate medications. No inconsistencies noted.  Meds ordered this encounter  Medications   Oxycodone HCl 10 MG TABS    Sig: Take 1 tablet by mouth every 4 hours as needed (up to 15 days).    Dispense:  90 tablet    Refill:  0    Order Specific Question:   Supervising Provider    Answer:   Tresa Garter [[id number]]   Donia Pounds  APRN, MSN, FNP-C [name] [address] [place], [place] [address] [phone]

## 2022-12-28 ENCOUNTER — Other Ambulatory Visit: Payer: Self-pay | Admitting: Family Medicine

## 2022-12-28 ENCOUNTER — Other Ambulatory Visit: Payer: Self-pay

## 2022-12-28 DIAGNOSIS — Z79891 Long term (current) use of opiate analgesic: Secondary | ICD-10-CM

## 2022-12-28 DIAGNOSIS — D571 Sickle-cell disease without crisis: Secondary | ICD-10-CM

## 2022-12-28 MED ORDER — OXYCODONE HCL 10 MG PO TABS: 10.0000 mg | ORAL_TABLET | ORAL | 0 refills | Status: DC | PRN

## 2022-12-28 NOTE — Telephone Encounter (Signed)
From: [facility] To: Office of Cammie Sickle, [place] Sent: 12/28/2022 12:35 PM EST Subject: Medication Renewal Request  Refills have been requested for the following medications:   Oxycodone HCl 10 MG TABS [Lachina Hollis]  Preferred pharmacy: [id number] - [place], [place] Delivery method: [employer]

## 2022-12-28 NOTE — Progress Notes (Unsigned)
Reviewed PDMP substance reporting system prior to prescribing opiate medications. No inconsistencies noted.  No orders of the defined types were placed in this encounter.

## 2023-01-16 ENCOUNTER — Other Ambulatory Visit: Payer: Self-pay

## 2023-01-16 DIAGNOSIS — D571 Sickle-cell disease without crisis: Secondary | ICD-10-CM

## 2023-01-16 DIAGNOSIS — Z79891 Long term (current) use of opiate analgesic: Secondary | ICD-10-CM

## 2023-01-16 MED ORDER — OXYCODONE HCL 10 MG PO TABS: 10.0000 mg | ORAL_TABLET | ORAL | 0 refills | Status: DC | PRN

## 2023-01-16 NOTE — Telephone Encounter (Signed)
Please advise KH 

## 2023-01-16 NOTE — Telephone Encounter (Signed)
From: [facility] To: Office of Cammie Sickle, [place] Sent: 01/16/2023 8:28 AM EST Subject: Medication Renewal Request  Refills have been requested for the following medications:   Oxycodone HCl 10 MG TABS [Lachina Hollis]  [id number] [employer]

## 2023-02-06 ENCOUNTER — Other Ambulatory Visit (HOSPITAL_COMMUNITY): Payer: Self-pay

## 2023-02-06 ENCOUNTER — Other Ambulatory Visit: Payer: Self-pay | Admitting: Nurse Practitioner

## 2023-02-06 DIAGNOSIS — Z79891 Long term (current) use of opiate analgesic: Secondary | ICD-10-CM

## 2023-02-06 DIAGNOSIS — D571 Sickle-cell disease without crisis: Secondary | ICD-10-CM

## 2023-02-06 MED ORDER — OXYCODONE HCL 10 MG PO TABS
10.0000 mg | ORAL_TABLET | ORAL | 0 refills | Status: DC | PRN
  Filled 2023-02-13: qty 90, 15d supply, fill #0

## 2023-02-13 ENCOUNTER — Other Ambulatory Visit: Payer: Self-pay

## 2023-02-13 ENCOUNTER — Other Ambulatory Visit (HOSPITAL_COMMUNITY): Payer: Self-pay

## 2023-03-02 ENCOUNTER — Other Ambulatory Visit: Payer: Self-pay | Admitting: Nurse Practitioner

## 2023-03-02 ENCOUNTER — Other Ambulatory Visit (HOSPITAL_COMMUNITY): Payer: Self-pay

## 2023-03-02 ENCOUNTER — Other Ambulatory Visit: Payer: Self-pay

## 2023-03-02 DIAGNOSIS — Z79891 Long term (current) use of opiate analgesic: Secondary | ICD-10-CM

## 2023-03-02 DIAGNOSIS — D571 Sickle-cell disease without crisis: Secondary | ICD-10-CM

## 2023-03-02 MED ORDER — OXYCODONE HCL 10 MG PO TABS
10.0000 mg | ORAL_TABLET | ORAL | 0 refills | Status: DC | PRN
  Filled 2023-03-02: qty 90, 15d supply, fill #0

## 2023-03-02 NOTE — Telephone Encounter (Signed)
Please advise due to [place] being out

## 2023-03-22 ENCOUNTER — Other Ambulatory Visit: Payer: Self-pay | Admitting: Internal Medicine

## 2023-03-22 DIAGNOSIS — Z79891 Long term (current) use of opiate analgesic: Secondary | ICD-10-CM

## 2023-03-22 DIAGNOSIS — D571 Sickle-cell disease without crisis: Secondary | ICD-10-CM

## 2023-03-22 NOTE — Telephone Encounter (Signed)
Requested medication (s) are due for refill today - no  Requested medication (s) are on the active medication list -yes  Future visit scheduled -no  Last refill: 03/02/23 #90  Notes to clinic: not sure where this needs to be routed- non delegated Rx- prescriber not at practice  Requested Prescriptions  Pending Prescriptions Disp Refills   Oxycodone HCl 10 MG TABS 90 tablet 0    Sig: Take 1 tablet (10 mg) by mouth every 4 hours as needed (up to 15 days).     There is no refill protocol information for this order       Requested Prescriptions  Pending Prescriptions Disp Refills   Oxycodone HCl 10 MG TABS 90 tablet 0    Sig: Take 1 tablet (10 mg) by mouth every 4 hours as needed (up to 15 days).     There is no refill protocol information for this order

## 2023-03-23 ENCOUNTER — Other Ambulatory Visit: Payer: Self-pay

## 2023-03-23 ENCOUNTER — Other Ambulatory Visit (HOSPITAL_COMMUNITY): Payer: Self-pay

## 2023-03-23 ENCOUNTER — Other Ambulatory Visit: Payer: Self-pay | Admitting: Family Medicine

## 2023-03-23 DIAGNOSIS — D571 Sickle-cell disease without crisis: Secondary | ICD-10-CM

## 2023-03-23 DIAGNOSIS — Z79891 Long term (current) use of opiate analgesic: Secondary | ICD-10-CM

## 2023-03-23 MED ORDER — OXYCODONE HCL 10 MG PO TABS
10.0000 mg | ORAL_TABLET | ORAL | 0 refills | Status: DC | PRN
Start: 2023-03-23 — End: 2023-04-10
  Filled 2023-03-23: qty 90, 15d supply, fill #0

## 2023-03-26 ENCOUNTER — Other Ambulatory Visit: Payer: Self-pay | Admitting: Family Medicine

## 2023-03-26 ENCOUNTER — Other Ambulatory Visit: Payer: Self-pay

## 2023-03-26 DIAGNOSIS — E559 Vitamin D deficiency, unspecified: Secondary | ICD-10-CM

## 2023-03-26 DIAGNOSIS — Z79891 Long term (current) use of opiate analgesic: Secondary | ICD-10-CM

## 2023-03-26 DIAGNOSIS — D571 Sickle-cell disease without crisis: Secondary | ICD-10-CM

## 2023-03-26 NOTE — Progress Notes (Signed)
Orders Placed This Encounter  Procedures   [id number] 11+Oxyco+Alc+Crt-Bund    Standing Status:   Future    Standing Expiration Date:   03/25/2024   Sickle Cell Panel    Standing Status:   Future    Standing Expiration Date:   03/25/2024   Nolon Nations  APRN, MSN, FNP-C Patient Care [facility] [address] [place], [place] [address] [phone]

## 2023-03-27 LAB — CMP14+CBC/D/PLT+FER+RETIC+V...
AST: 66 IU/L — ABNORMAL HIGH (ref 0–40)
BUN/Creatinine Ratio: 10 (ref 9–20)
BUN: 10 mg/dL (ref 6–20)
Bilirubin Total: 3.6 mg/dL — ABNORMAL HIGH (ref 0.0–1.2)
Creatinine, Ser: 1.02 mg/dL (ref 0.76–1.27)

## 2023-03-28 ENCOUNTER — Other Ambulatory Visit (HOSPITAL_COMMUNITY): Payer: Self-pay

## 2023-03-28 ENCOUNTER — Other Ambulatory Visit: Payer: Self-pay | Admitting: Family Medicine

## 2023-03-28 DIAGNOSIS — E559 Vitamin D deficiency, unspecified: Secondary | ICD-10-CM

## 2023-03-28 LAB — DRUG SCREEN 764883 11+OXYCO+ALC+CRT-BUND

## 2023-03-28 MED ORDER — VITAMIN D (ERGOCALCIFEROL) 1.25 MG (50000 UNIT) PO CAPS
50000.0000 [IU] | ORAL_CAPSULE | ORAL | 3 refills | Status: DC
Start: 2023-03-28 — End: 2024-01-30
  Filled 2023-03-28 – 2023-05-10 (×2): qty 12, 84d supply, fill #0

## 2023-03-28 NOTE — Progress Notes (Signed)
Please inform patient that vitamin D was markedly decreased at 12.6.  Drisdol 50,000 IUs weekly.  Also, recommend vitamin D fortified foods such as mackerel, almond milk, yogurt, etc.  Follow-up in 3 months, will repeat vitamin D level at that time.  Please ensure that patient has regular follow-up scheduled.  Nolon Nations  APRN, MSN, FNP-C Patient Care [facility] [address] [place], [place] [address] [phone]

## 2023-03-28 NOTE — Progress Notes (Signed)
Meds ordered this encounter  Medications   Vitamin D, Ergocalciferol, (DRISDOL) 1.25 MG (50000 UNIT) CAPS capsule    Sig: TAKE 1 CAPSULE BY MOUTH EVERY 7 (SEVEN) DAYS.    Dispense:  12 capsule    Refill:  3    Order Specific Question:   Supervising Provider    Answer:   Quentin Angst [[id number]]   Nolon Nations  APRN, MSN, FNP-C Patient Care [facility] [address] [place], [place] [address] [phone]

## 2023-03-29 ENCOUNTER — Other Ambulatory Visit: Payer: Self-pay

## 2023-03-31 LAB — CMP14+CBC/D/PLT+FER+RETIC+V...
ALT: 21 IU/L (ref 0–44)
Albumin/Globulin Ratio: 1.4 (ref 1.2–2.2)
Albumin: 4.6 g/dL (ref 4.1–5.1)
Alkaline Phosphatase: 90 IU/L (ref 44–121)
Basophils Absolute: 0.2 10*3/uL (ref 0.0–0.2)
Basos: 2 %
CO2: 21 mmol/L (ref 20–29)
Calcium: 9.9 mg/dL (ref 8.7–10.2)
Chloride: 102 mmol/L (ref 96–106)
EOS (ABSOLUTE): 0.5 10*3/uL — ABNORMAL HIGH (ref 0.0–0.4)
Ferritin: 180 ng/mL (ref 30–400)
Globulin, Total: 3.3 g/dL (ref 1.5–4.5)
Glucose: 86 mg/dL (ref 70–99)
Hemoglobin: 8.2 g/dL — ABNORMAL LOW (ref 13.0–17.7)
Immature Grans (Abs): 0.2 10*3/uL — ABNORMAL HIGH (ref 0.0–0.1)
Lymphocytes Absolute: 2.8 10*3/uL (ref 0.7–3.1)
MCHC: 34 g/dL (ref 31.5–35.7)
MCV: 99 fL — ABNORMAL HIGH (ref 79–97)
Monocytes: 12 %
NRBC: 1 % — ABNORMAL HIGH (ref 0–0)
Neutrophils Absolute: 5.2 10*3/uL (ref 1.4–7.0)
Neutrophils: 51 %
Platelets: 392 10*3/uL (ref 150–450)
Potassium: 4.8 mmol/L (ref 3.5–5.2)
RBC: 2.43 x10E6/uL — CL (ref 4.14–5.80)
RDW: 22.1 % — ABNORMAL HIGH (ref 11.6–15.4)
Retic Ct Pct: 15.9 % — ABNORMAL HIGH (ref 0.6–2.6)
Sodium: 142 mmol/L (ref 134–144)
Total Protein: 7.9 g/dL (ref 6.0–8.5)
Vit D, 25-Hydroxy: 12.4 ng/mL — ABNORMAL LOW (ref 30.0–100.0)
eGFR: 99 mL/min/{1.73_m2} (ref >59)

## 2023-04-01 LAB — OXYCODONE/OXYMORPHONE, CONFIRM
OXYCODONE/OXYMORPH: POSITIVE — AB
OXYCODONE: NEGATIVE
OXYMORPHONE (GC/MS): 720 ng/mL
OXYMORPHONE: POSITIVE — AB

## 2023-04-01 LAB — CANNABINOID CONFIRMATION, UR
CANNABINOIDS: POSITIVE — AB
Carboxy THC GC/MS Conf: 210 ng/mL

## 2023-04-01 LAB — DRUG SCREEN 764883 11+OXYCO+ALC+CRT-BUND
BENZODIAZ UR QL: NEGATIVE ng/mL
Barbiturate: NEGATIVE ng/mL
Meperidine: NEGATIVE ng/mL
OPIATE SCREEN URINE: NEGATIVE ng/mL
Phencyclidine: NEGATIVE ng/mL
Propoxyphene: NEGATIVE ng/mL

## 2023-04-06 ENCOUNTER — Other Ambulatory Visit (HOSPITAL_COMMUNITY): Payer: Self-pay

## 2023-04-09 ENCOUNTER — Other Ambulatory Visit: Payer: Self-pay | Admitting: Family Medicine

## 2023-04-09 DIAGNOSIS — D571 Sickle-cell disease without crisis: Secondary | ICD-10-CM

## 2023-04-09 DIAGNOSIS — Z79891 Long term (current) use of opiate analgesic: Secondary | ICD-10-CM

## 2023-04-10 ENCOUNTER — Other Ambulatory Visit (HOSPITAL_COMMUNITY): Payer: Self-pay

## 2023-04-10 ENCOUNTER — Other Ambulatory Visit: Payer: Self-pay | Admitting: Family Medicine

## 2023-04-10 DIAGNOSIS — Z79891 Long term (current) use of opiate analgesic: Secondary | ICD-10-CM

## 2023-04-10 DIAGNOSIS — D571 Sickle-cell disease without crisis: Secondary | ICD-10-CM

## 2023-04-10 MED ORDER — OXYCODONE HCL 10 MG PO TABS
10.0000 mg | ORAL_TABLET | ORAL | 0 refills | Status: DC | PRN
Start: 2023-04-10 — End: 2023-04-26
  Filled 2023-04-10: qty 90, 15d supply, fill #0

## 2023-04-10 NOTE — Progress Notes (Signed)
Reviewed PDMP substance reporting system prior to prescribing opiate medications. No inconsistencies noted.  Meds ordered this encounter  Medications   Oxycodone HCl 10 MG TABS    Sig: Take 1 tablet (10 mg) by mouth every 4 hours as needed (up to 15 days).    Dispense:  90 tablet    Refill:  0    NEEDS AN APPOINTMENT    Order Specific Question:   Supervising Provider    Answer:   Quentin Angst [[id number]]   Nolon Nations  APRN, MSN, FNP-C Patient Care [facility] [address] [place], [place] [address] [phone]

## 2023-04-11 ENCOUNTER — Other Ambulatory Visit: Payer: Self-pay

## 2023-04-25 ENCOUNTER — Other Ambulatory Visit: Payer: Self-pay | Admitting: Family Medicine

## 2023-04-25 DIAGNOSIS — Z79891 Long term (current) use of opiate analgesic: Secondary | ICD-10-CM

## 2023-04-25 DIAGNOSIS — D571 Sickle-cell disease without crisis: Secondary | ICD-10-CM

## 2023-04-25 NOTE — Telephone Encounter (Signed)
Please advise Kh 

## 2023-04-26 ENCOUNTER — Other Ambulatory Visit: Payer: Self-pay

## 2023-04-26 ENCOUNTER — Other Ambulatory Visit (HOSPITAL_COMMUNITY): Payer: Self-pay

## 2023-04-26 ENCOUNTER — Other Ambulatory Visit: Payer: Self-pay | Admitting: Family Medicine

## 2023-04-26 DIAGNOSIS — D571 Sickle-cell disease without crisis: Secondary | ICD-10-CM

## 2023-04-26 DIAGNOSIS — Z79891 Long term (current) use of opiate analgesic: Secondary | ICD-10-CM

## 2023-04-26 MED ORDER — OXYCODONE HCL 10 MG PO TABS
10.0000 mg | ORAL_TABLET | ORAL | 0 refills | Status: DC | PRN
Start: 2023-04-26 — End: 2023-05-10
  Filled 2023-04-26: qty 73, 12d supply, fill #0
  Filled 2023-04-26: qty 17, 3d supply, fill #0

## 2023-04-26 NOTE — Progress Notes (Signed)
Reviewed PDMP substance reporting system prior to prescribing opiate medications. No inconsistencies noted.  Meds ordered this encounter  Medications   Oxycodone HCl 10 MG TABS    Sig: Take 1 tablet (10 mg) by mouth every 4 hours as needed (up to 15 days).    Dispense:  90 tablet    Refill:  0    NEEDS AN APPOINTMENT    Order Specific Question:   Supervising Provider    Answer:   JEGEDE, OLUGBEMIGA E [[id number]]   Tametra Ahart Moore Romain Erion  APRN, MSN, FNP-C Patient Care [facility] [address]  [place],  [address] [phone]  

## 2023-05-10 ENCOUNTER — Other Ambulatory Visit: Payer: Self-pay | Admitting: Family Medicine

## 2023-05-10 ENCOUNTER — Other Ambulatory Visit: Payer: Self-pay

## 2023-05-10 ENCOUNTER — Other Ambulatory Visit (HOSPITAL_COMMUNITY): Payer: Self-pay

## 2023-05-10 DIAGNOSIS — D571 Sickle-cell disease without crisis: Secondary | ICD-10-CM

## 2023-05-10 DIAGNOSIS — Z79891 Long term (current) use of opiate analgesic: Secondary | ICD-10-CM

## 2023-05-10 MED ORDER — OXYCODONE HCL 10 MG PO TABS
10.0000 mg | ORAL_TABLET | ORAL | 0 refills | Status: DC | PRN
Start: 2023-05-11 — End: 2023-05-29
  Filled 2023-05-11: qty 90, 15d supply, fill #0

## 2023-05-10 NOTE — Progress Notes (Signed)
Reviewed PDMP substance reporting system prior to prescribing opiate medications. No inconsistencies noted.  Meds ordered this encounter  Medications   Oxycodone HCl 10 MG TABS    Sig: Take 1 tablet (10 mg) by mouth every 4 hours as needed (up to 15 days).    Dispense:  90 tablet    Refill:  0    NEEDS AN APPOINTMENT    Order Specific Question:   Supervising Provider    Answer:   JEGEDE, OLUGBEMIGA E [[id number]]   Jerry Karam Moore Levora Werden  APRN, MSN, FNP-C Patient Care [facility] [address]  [place], [place] [address] [phone]  

## 2023-05-10 NOTE — Telephone Encounter (Signed)
Please advise KH 

## 2023-05-11 ENCOUNTER — Other Ambulatory Visit (HOSPITAL_COMMUNITY): Payer: Self-pay

## 2023-05-29 ENCOUNTER — Other Ambulatory Visit: Payer: Self-pay | Admitting: Family Medicine

## 2023-05-29 ENCOUNTER — Other Ambulatory Visit (HOSPITAL_COMMUNITY): Payer: Self-pay

## 2023-05-29 DIAGNOSIS — D571 Sickle-cell disease without crisis: Secondary | ICD-10-CM

## 2023-05-29 DIAGNOSIS — Z79891 Long term (current) use of opiate analgesic: Secondary | ICD-10-CM

## 2023-05-29 MED ORDER — OXYCODONE HCL 10 MG PO TABS
10.0000 mg | ORAL_TABLET | ORAL | 0 refills | Status: DC | PRN
Start: 2023-05-29 — End: 2023-06-13
  Filled 2023-05-29: qty 90, 15d supply, fill #0

## 2023-05-29 NOTE — Telephone Encounter (Signed)
Please advise KH 

## 2023-05-29 NOTE — Progress Notes (Signed)
Reviewed PDMP substance reporting system prior to prescribing opiate medications. No inconsistencies noted.  Meds ordered this encounter  Medications   Oxycodone HCl 10 MG TABS    Sig: Take 1 tablet (10 mg) by mouth every 4 hours as needed (up to 15 days).    Dispense:  90 tablet    Refill:  0    NEEDS AN APPOINTMENT    Order Specific Question:   Supervising Provider    Answer:   JEGEDE, OLUGBEMIGA E [[id number]]   Jerry Hannig Moore Saamir Armstrong  APRN, MSN, FNP-C Patient [facility] [address]  [place], [place] [address] [phone]  

## 2023-05-30 ENCOUNTER — Other Ambulatory Visit (HOSPITAL_COMMUNITY): Payer: Self-pay

## 2023-06-13 ENCOUNTER — Other Ambulatory Visit: Payer: Self-pay | Admitting: Family Medicine

## 2023-06-13 ENCOUNTER — Other Ambulatory Visit (HOSPITAL_COMMUNITY): Payer: Self-pay

## 2023-06-13 DIAGNOSIS — D571 Sickle-cell disease without crisis: Secondary | ICD-10-CM

## 2023-06-13 DIAGNOSIS — Z79891 Long term (current) use of opiate analgesic: Secondary | ICD-10-CM

## 2023-06-13 MED ORDER — OXYCODONE HCL 10 MG PO TABS
10.0000 mg | ORAL_TABLET | ORAL | 0 refills | Status: DC | PRN
Start: 2023-06-14 — End: 2023-07-02
  Filled 2023-06-14: qty 90, 15d supply, fill #0

## 2023-06-13 NOTE — Progress Notes (Signed)
Reviewed PDMP substance reporting system prior to prescribing opiate medications. No inconsistencies noted.  Meds ordered this encounter  Medications   Oxycodone HCl 10 MG TABS    Sig: Take 1 tablet (10 mg) by mouth every 4 hours as needed (up to 15 days).    Dispense:  90 tablet    Refill:  0    NEEDS AN APPOINTMENT    Order Specific Question:   Supervising Provider    Answer:   JEGEDE, OLUGBEMIGA E [[id number]]   Jerry Moore Hollis  APRN, MSN, FNP-C Patient [facility] [address]  [place], [place] [address] [phone]  

## 2023-06-14 ENCOUNTER — Other Ambulatory Visit (HOSPITAL_COMMUNITY): Payer: Self-pay

## 2023-06-19 ENCOUNTER — Ambulatory Visit (INDEPENDENT_AMBULATORY_CARE_PROVIDER_SITE_OTHER): Payer: Self-pay | Admitting: Family Medicine

## 2023-06-19 VITALS — BP 102/58 | HR 74 | Temp 97.1°F | Wt 174.0 lb

## 2023-06-19 DIAGNOSIS — E559 Vitamin D deficiency, unspecified: Secondary | ICD-10-CM

## 2023-06-19 DIAGNOSIS — D571 Sickle-cell disease without crisis: Secondary | ICD-10-CM

## 2023-06-19 DIAGNOSIS — F329 Major depressive disorder, single episode, unspecified: Secondary | ICD-10-CM

## 2023-06-19 NOTE — Progress Notes (Signed)
Established Patient Office Visit  Subjective   Patient ID: Jerry [name], male    DOB: [birth year]  Age: 37 y.o. MRN: [id number]  Chief Complaint  Patient presents with   Sickle Cell Anemia    Jerry [name] is a very pleasant 37 year old male with a medical history significant for sickle cell disease, chronic pain syndrome, opiate dependence and tolerance, vitamin D deficiency, and anemia of chronic disease presents accompanied by wife for follow-up of sickle cell disease and medication management. Patient states that he has been doing very well and is without complaint.  Patient has a long history of chronic pain primarily to his lower back.  Pain has been well-controlled on home oxycodone and ibuprofen.  Patient has not been taking hydroxyurea consistently over the past several months due to insurance constraints.  He denies any fever, chills, chest pain, shortness of breath, urinary symptoms, nausea, vomiting, or diarrhea.    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 Social History   Tobacco Use   Smoking status: Every Day   Smokeless tobacco: Never  Vaping Use   Vaping status: Some Days  Substance  Use Topics   Alcohol use: No    Comment: rare   Drug use: Yes    Types: Marijuana    Comment: occasionally   Social History   Socioeconomic History   Marital status: Single    Spouse name: Not on file   Number of children: Not on file   Years of education: Not on file   Highest education level: Not on file  Occupational History   Not on file  Tobacco Use   Smoking status: Every Day   Smokeless tobacco: Never  Vaping Use   Vaping status: Some Days  Substance and Sexual Activity   Alcohol use: No    Comment: rare   Drug use: Yes    Types: Marijuana    Comment: occasionally   Sexual activity: Yes    Birth control/protection: Condom  Other Topics Concern   Not on file  Social History Narrative   Not on file   Social Determinants of Health   Financial Resource Strain: Not on file  Food Insecurity: Not on file  Transportation Needs: Not on file  Physical Activity: Not on file  Stress: Not on file  Social Connections: Not on file  Intimate Partner Violence: Not on file   Family Status  Relation Name Status   Mother  Alive   Father  Alive   Sister  Alive   Sister  Alive  No partnership data on file   Family History  Problem Relation Age of Onset   Sickle cell trait Mother    No  Known Allergies    Review of Systems  Constitutional: Negative.   HENT: Negative.    Eyes: Negative.   Respiratory: Negative.  Negative for shortness of breath.   Cardiovascular: Negative.   Gastrointestinal: Negative.   Genitourinary: Negative.   Musculoskeletal:  Positive for back pain and joint pain.  Skin: Negative.   Neurological: Negative.   Psychiatric/Behavioral: Negative.        Objective:     BP (!) 102/58   Pulse 74   Temp (!) 97.1 F (36.2 C)   Wt 174 lb (78.9 kg)   SpO2 98%   BMI 24.27 kg/m  BP Readings from Last 3 Encounters:  06/19/23 (!) 102/58  12/04/22 129/79  10/24/22 (!) 105/57   Wt Readings from Last 3 Encounters:  06/19/23 174 lb (78.9  kg)  12/04/22 176 lb (79.8 kg)  10/24/22 176 lb 6.4 oz (80 kg)      Physical Exam Constitutional:      Appearance: Normal appearance.  Eyes:     Pupils: Pupils are equal, round, and reactive to light.  Cardiovascular:     Rate and Rhythm: Normal rate and regular rhythm.     Pulses: Normal pulses.  Abdominal:     General: Bowel sounds are normal.  Neurological:     General: No focal deficit present.     Mental Status: He is alert. Mental status is at baseline.     No results found for any visits on 06/19/23.  Last CBC Lab Results  Component Value Date   WBC 10.1 03/26/2023   HGB 8.2 (L) 03/26/2023   HCT 24.1 (L) 03/26/2023   MCV 99 (H) 03/26/2023   MCH 33.7 (H) 03/26/2023   RDW 22.1 (H) 03/26/2023   PLT 392 03/26/2023   Last metabolic panel Lab Results  Component Value Date   GLUCOSE 86 03/26/2023   NA 142 03/26/2023   K 4.8 03/26/2023   CL 102 03/26/2023   CO2 21 03/26/2023   BUN 10 03/26/2023   CREATININE 1.02 03/26/2023   EGFR 99 03/26/2023   CALCIUM 9.9 03/26/2023   PROT 7.9 03/26/2023   ALBUMIN 4.6 03/26/2023   LABGLOB 3.3 03/26/2023   AGRATIO 1.4 03/26/2023   BILITOT 3.6 (H) 03/26/2023   ALKPHOS 90 03/26/2023   AST 66 (H) 03/26/2023   ALT 21 03/26/2023   ANIONGAP 5 03/05/2021   Last lipids Lab Results  Component Value Date   CHOL 120 10/24/2022   HDL 49 10/24/2022   LDLCALC 57 10/24/2022   TRIG 69 10/24/2022   CHOLHDL 2.4 10/24/2022   Last hemoglobin A1c No results found for: "HGBA1C" Last thyroid functions Lab Results  Component Value Date   TSH 0.809 07/04/2019   T4TOTAL 8.1 10/17/2018   Last vitamin D Lab Results  Component Value Date   VD25OH 12.4 (L) 03/26/2023   Last vitamin B12 and Folate Lab Results  Component Value Date   FOLATE 13.7 05/16/2022      The ASCVD Risk score (Arnett DK, et al., 2019) failed to calculate for the following reasons:   The 2019 ASCVD risk score is only valid for ages 2 to 65    Assessment  & Plan:   Problem List Items Addressed This Visit       Other   Vitamin D deficiency   Hb-SS disease without crisis (HCC) - Primary  1. Hb-SS disease without crisis [facility]) Patient will follow up with sickle cell agency for assistance with insurance.  Recommend that patient  restart hydroxyurea. - Sickle Cell Panel  2. Vitamin D deficiency We will follow-up by phone with any laboratory results. - Sickle Cell Panel  3. Major depressive disorder, remission status unspecified, unspecified whether recurrent   No follow-ups on file.  t Nolon Nations  APRN, MSN, FNP-C Patient Care [facility] [address] [place], [place] [address] [phone]

## 2023-06-20 LAB — CMP14+CBC/D/PLT+FER+RETIC+V...
ALT: 17 IU/L (ref 0–44)
AST: 53 IU/L — ABNORMAL HIGH (ref 0–40)
Albumin: 4.6 g/dL (ref 4.1–5.1)
Alkaline Phosphatase: 93 IU/L (ref 44–121)
BUN/Creatinine Ratio: 10 (ref 9–20)
BUN: 10 mg/dL (ref 6–20)
Basophils Absolute: 0.1 10*3/uL (ref 0.0–0.2)
Basos: 1 %
Bilirubin Total: 2.9 mg/dL — ABNORMAL HIGH (ref 0.0–1.2)
CO2: 17 mmol/L — ABNORMAL LOW (ref 20–29)
Calcium: 9.6 mg/dL (ref 8.7–10.2)
Chloride: 103 mmol/L (ref 96–106)
Creatinine, Ser: 1.03 mg/dL (ref 0.76–1.27)
EOS (ABSOLUTE): 0.4 10*3/uL (ref 0.0–0.4)
Eos: 3 %
Ferritin: 180 ng/mL (ref 30–400)
Globulin, Total: 3.4 g/dL (ref 1.5–4.5)
Glucose: 79 mg/dL (ref 70–99)
Hematocrit: 24.2 % — ABNORMAL LOW (ref 37.5–51.0)
Hemoglobin: 8.3 g/dL — ABNORMAL LOW (ref 13.0–17.7)
Immature Grans (Abs): 0.1 10*3/uL (ref 0.0–0.1)
Immature Granulocytes: 1 %
Lymphocytes Absolute: 4.8 10*3/uL — ABNORMAL HIGH (ref 0.7–3.1)
Lymphs: 38 %
MCH: 34.6 pg — ABNORMAL HIGH (ref 26.6–33.0)
MCHC: 34.3 g/dL (ref 31.5–35.7)
MCV: 101 fL — ABNORMAL HIGH (ref 79–97)
Monocytes Absolute: 1.8 10*3/uL — ABNORMAL HIGH (ref 0.1–0.9)
Monocytes: 14 %
NRBC: 2 % — ABNORMAL HIGH (ref 0–0)
Neutrophils Absolute: 5.7 10*3/uL (ref 1.4–7.0)
Neutrophils: 43 %
Platelets: 350 10*3/uL (ref 150–450)
Potassium: 5.3 mmol/L — ABNORMAL HIGH (ref 3.5–5.2)
RBC: 2.4 x10E6/uL — CL (ref 4.14–5.80)
RDW: 24 % — ABNORMAL HIGH (ref 11.6–15.4)
Retic Ct Pct: 16.3 % — ABNORMAL HIGH (ref 0.6–2.6)
Sodium: 140 mmol/L (ref 134–144)
Total Protein: 8 g/dL (ref 6.0–8.5)
Vit D, 25-Hydroxy: 31.7 ng/mL (ref 30.0–100.0)
WBC: 12.9 10*3/uL — ABNORMAL HIGH (ref 3.4–10.8)
eGFR: 98 mL/min/{1.73_m2} (ref >59)

## 2023-06-21 ENCOUNTER — Other Ambulatory Visit: Payer: Self-pay

## 2023-06-25 ENCOUNTER — Other Ambulatory Visit: Payer: Self-pay

## 2023-07-02 ENCOUNTER — Other Ambulatory Visit (HOSPITAL_COMMUNITY): Payer: Self-pay

## 2023-07-02 ENCOUNTER — Other Ambulatory Visit: Payer: Self-pay | Admitting: Family Medicine

## 2023-07-02 DIAGNOSIS — D571 Sickle-cell disease without crisis: Secondary | ICD-10-CM

## 2023-07-02 DIAGNOSIS — Z79891 Long term (current) use of opiate analgesic: Secondary | ICD-10-CM

## 2023-07-02 MED ORDER — OXYCODONE HCL 10 MG PO TABS
10.0000 mg | ORAL_TABLET | ORAL | 0 refills | Status: DC | PRN
Start: 2023-07-02 — End: 2023-07-16
  Filled 2023-07-02: qty 90, 15d supply, fill #0

## 2023-07-02 NOTE — Progress Notes (Signed)
Reviewed PDMP substance reporting system prior to prescribing opiate medications. No inconsistencies noted.  Meds ordered this encounter  Medications   Oxycodone HCl 10 MG TABS    Sig: Take 1 tablet (10 mg) by mouth every 4 hours as needed (up to 15 days).    Dispense:  90 tablet    Refill:  0    NEEDS AN APPOINTMENT    Order Specific Question:   Supervising Provider    Answer:   JEGEDE, OLUGBEMIGA E [[id number]]    Moore   APRN, MSN, FNP-C [facility] [address]  [place], [place] [address] [phone]  

## 2023-07-02 NOTE — Telephone Encounter (Signed)
Please advise Kh 

## 2023-07-16 ENCOUNTER — Other Ambulatory Visit: Payer: Self-pay | Admitting: Family Medicine

## 2023-07-16 ENCOUNTER — Other Ambulatory Visit (HOSPITAL_COMMUNITY): Payer: Self-pay

## 2023-07-16 DIAGNOSIS — D571 Sickle-cell disease without crisis: Secondary | ICD-10-CM

## 2023-07-16 DIAGNOSIS — Z79891 Long term (current) use of opiate analgesic: Secondary | ICD-10-CM

## 2023-07-16 MED ORDER — OXYCODONE HCL 10 MG PO TABS
10.0000 mg | ORAL_TABLET | ORAL | 0 refills | Status: DC | PRN
  Filled 2023-07-16: qty 90, 15d supply, fill #0

## 2023-07-17 ENCOUNTER — Other Ambulatory Visit (HOSPITAL_COMMUNITY): Payer: Self-pay

## 2023-07-17 ENCOUNTER — Other Ambulatory Visit: Payer: Self-pay

## 2023-08-01 ENCOUNTER — Other Ambulatory Visit: Payer: Self-pay | Admitting: Internal Medicine

## 2023-08-01 ENCOUNTER — Other Ambulatory Visit: Payer: Self-pay | Admitting: Family Medicine

## 2023-08-01 ENCOUNTER — Other Ambulatory Visit (HOSPITAL_COMMUNITY): Payer: Self-pay

## 2023-08-01 DIAGNOSIS — Z79891 Long term (current) use of opiate analgesic: Secondary | ICD-10-CM

## 2023-08-01 DIAGNOSIS — D571 Sickle-cell disease without crisis: Secondary | ICD-10-CM

## 2023-08-01 MED ORDER — OXYCODONE HCL 10 MG PO TABS
10.0000 mg | ORAL_TABLET | ORAL | 0 refills | Status: DC | PRN
  Filled 2023-08-01: qty 90, 15d supply, fill #0

## 2023-08-02 ENCOUNTER — Other Ambulatory Visit: Payer: Self-pay

## 2023-08-17 ENCOUNTER — Other Ambulatory Visit: Payer: Self-pay | Admitting: Family Medicine

## 2023-08-17 DIAGNOSIS — Z79891 Long term (current) use of opiate analgesic: Secondary | ICD-10-CM

## 2023-08-17 DIAGNOSIS — D571 Sickle-cell disease without crisis: Secondary | ICD-10-CM

## 2023-08-20 ENCOUNTER — Other Ambulatory Visit (HOSPITAL_COMMUNITY): Payer: Self-pay

## 2023-08-20 DIAGNOSIS — D571 Sickle-cell disease without crisis: Secondary | ICD-10-CM

## 2023-08-20 DIAGNOSIS — Z79891 Long term (current) use of opiate analgesic: Secondary | ICD-10-CM

## 2023-08-20 MED ORDER — OXYCODONE HCL 10 MG PO TABS
10.0000 mg | ORAL_TABLET | ORAL | 0 refills | Status: DC | PRN
Start: 2023-08-20 — End: 2023-09-07
  Filled 2023-08-20: qty 90, 15d supply, fill #0

## 2023-08-20 NOTE — Telephone Encounter (Signed)
Reviewed PDMP substance reporting system prior to prescribing opiate medications. No inconsistencies noted.   Meds ordered this encounter  Medications  . Oxycodone HCl 10 MG TABS    Sig: Take 1 tablet (10 mg total) by mouth every 4 (four) hours as needed for up to 15 days.    Dispense:  90 tablet    Refill:  0    Order Specific Question:   Supervising Provider    Answer:   Quentin Angst [[id number]]    Nolon Nations  APRN, MSN, FNP-C Patient Care [facility] [address] [place], [place] [address] [phone]

## 2023-08-21 ENCOUNTER — Other Ambulatory Visit: Payer: Self-pay

## 2023-09-07 ENCOUNTER — Other Ambulatory Visit: Payer: Self-pay | Admitting: Family Medicine

## 2023-09-07 ENCOUNTER — Other Ambulatory Visit (HOSPITAL_COMMUNITY): Payer: Self-pay

## 2023-09-07 DIAGNOSIS — Z79891 Long term (current) use of opiate analgesic: Secondary | ICD-10-CM

## 2023-09-07 DIAGNOSIS — D571 Sickle-cell disease without crisis: Secondary | ICD-10-CM

## 2023-09-07 MED ORDER — OXYCODONE HCL 10 MG PO TABS
10.0000 mg | ORAL_TABLET | ORAL | 0 refills | Status: DC | PRN
Start: 2023-09-07 — End: 2023-09-21
  Filled 2023-09-07: qty 90, 15d supply, fill #0

## 2023-09-07 NOTE — Progress Notes (Signed)
Reviewed PDMP substance reporting system prior to prescribing opiate medications. No inconsistencies noted.  Meds ordered this encounter  Medications   Oxycodone HCl 10 MG TABS    Sig: Take 1 tablet (10 mg total) by mouth every 4 (four) hours as needed for up to 15 days.    Dispense:  90 tablet    Refill:  0    Order Specific Question:   Supervising Provider    Answer:   JEGEDE, OLUGBEMIGA E [[id number]]   Kinzly Pierrelouis Moore [name]  APRN, MSN, FNP-C [facility] [address]  [place], [place] [address] [phone]  

## 2023-09-07 NOTE — Telephone Encounter (Signed)
Please advise KH 

## 2023-09-12 ENCOUNTER — Other Ambulatory Visit: Payer: Self-pay

## 2023-09-18 ENCOUNTER — Ambulatory Visit: Payer: Self-pay | Admitting: Family Medicine

## 2023-09-21 ENCOUNTER — Other Ambulatory Visit: Payer: Self-pay | Admitting: Family Medicine

## 2023-09-21 ENCOUNTER — Other Ambulatory Visit (HOSPITAL_COMMUNITY): Payer: Self-pay

## 2023-09-21 DIAGNOSIS — D571 Sickle-cell disease without crisis: Secondary | ICD-10-CM

## 2023-09-21 DIAGNOSIS — Z79891 Long term (current) use of opiate analgesic: Secondary | ICD-10-CM

## 2023-09-21 MED ORDER — OXYCODONE HCL 10 MG PO TABS
10.0000 mg | ORAL_TABLET | ORAL | 0 refills | Status: DC | PRN
Start: 2023-09-21 — End: 2023-10-10
  Filled 2023-09-21: qty 90, 15d supply, fill #0

## 2023-09-21 NOTE — Telephone Encounter (Signed)
Please advise Kh

## 2023-10-07 ENCOUNTER — Other Ambulatory Visit: Payer: Self-pay | Admitting: Internal Medicine

## 2023-10-07 DIAGNOSIS — Z79891 Long term (current) use of opiate analgesic: Secondary | ICD-10-CM

## 2023-10-07 DIAGNOSIS — D571 Sickle-cell disease without crisis: Secondary | ICD-10-CM

## 2023-10-09 NOTE — Telephone Encounter (Signed)
Requested medication (s) are due for refill today:   This rx for some reason came to the [facility] ([facility]  Requested medication (s) are on the active medication list:     Future visit scheduled:      Last ordered:    Requested Prescriptions  Pending Prescriptions Disp Refills   Oxycodone HCl 10 MG TABS 90 tablet 0    Sig: Take 1 tablet (10 mg total) by mouth every 4 (four) hours as needed for up to 15 days     There is no refill protocol information for this order

## 2023-10-10 ENCOUNTER — Telehealth: Payer: Self-pay | Admitting: Family Medicine

## 2023-10-10 ENCOUNTER — Other Ambulatory Visit (HOSPITAL_COMMUNITY): Payer: Self-pay

## 2023-10-10 ENCOUNTER — Telehealth: Payer: Self-pay | Admitting: *Deleted

## 2023-10-10 DIAGNOSIS — D571 Sickle-cell disease without crisis: Secondary | ICD-10-CM

## 2023-10-10 DIAGNOSIS — Z79891 Long term (current) use of opiate analgesic: Secondary | ICD-10-CM

## 2023-10-10 MED ORDER — OXYCODONE HCL 10 MG PO TABS
10.0000 mg | ORAL_TABLET | ORAL | 0 refills | Status: DC | PRN
Start: 2023-10-10 — End: 2023-10-24
  Filled 2023-10-10: qty 90, 15d supply, fill #0

## 2023-10-10 NOTE — Telephone Encounter (Signed)
Reviewed PDMP substance reporting system prior to prescribing opiate medications. No inconsistencies noted.  Meds ordered this encounter  Medications   Oxycodone HCl 10 MG TABS    Sig: Take 1 tablet (10 mg total) by mouth every 4 (four) hours as needed for up to 15 days.    Dispense:  90 tablet    Refill:  0    Order Specific Question:   Supervising Provider    Answer:   JEGEDE, OLUGBEMIGA E [[id number]]   Jerry Pierrelouis Moore [name]  APRN, MSN, FNP-C [facility] [address]  [place], [place] [address] [phone]  

## 2023-10-10 NOTE — Telephone Encounter (Signed)
Msg sent to provider for refill

## 2023-10-24 ENCOUNTER — Other Ambulatory Visit (HOSPITAL_COMMUNITY): Payer: Self-pay

## 2023-10-24 ENCOUNTER — Other Ambulatory Visit: Payer: Self-pay | Admitting: Family Medicine

## 2023-10-24 DIAGNOSIS — D571 Sickle-cell disease without crisis: Secondary | ICD-10-CM

## 2023-10-24 DIAGNOSIS — Z79891 Long term (current) use of opiate analgesic: Secondary | ICD-10-CM

## 2023-10-24 MED ORDER — OXYCODONE HCL 10 MG PO TABS
10.0000 mg | ORAL_TABLET | ORAL | 0 refills | Status: DC | PRN
Start: 2023-10-24 — End: 2023-11-08
  Filled 2023-10-24: qty 90, 15d supply, fill #0

## 2023-10-24 NOTE — Telephone Encounter (Signed)
Please advise [facility]

## 2023-10-24 NOTE — Progress Notes (Signed)
Reviewed PDMP substance reporting system prior to prescribing opiate medications. No inconsistencies noted.   Meds ordered this encounter  Medications  . Oxycodone HCl 10 MG TABS    Sig: Take 1 tablet (10 mg total) by mouth every 4 (four) hours as needed for up to 15 days.    Dispense:  90 tablet    Refill:  0    Order Specific Question:   Supervising Provider    Answer:   Quentin Angst [[id number]]    Nolon Nations  APRN, MSN, FNP-C Patient Care [facility] [address] [place], [place] [address] [phone]

## 2023-10-30 ENCOUNTER — Encounter: Payer: Self-pay | Admitting: Family Medicine

## 2023-10-30 ENCOUNTER — Ambulatory Visit (INDEPENDENT_AMBULATORY_CARE_PROVIDER_SITE_OTHER): Payer: Self-pay | Admitting: Family Medicine

## 2023-10-30 ENCOUNTER — Ambulatory Visit: Payer: Self-pay | Admitting: Family Medicine

## 2023-10-30 VITALS — BP 93/54 | HR 73 | Temp 97.0°F | Wt 178.0 lb

## 2023-10-30 DIAGNOSIS — Z79891 Long term (current) use of opiate analgesic: Secondary | ICD-10-CM

## 2023-10-30 DIAGNOSIS — D571 Sickle-cell disease without crisis: Secondary | ICD-10-CM

## 2023-10-30 DIAGNOSIS — E559 Vitamin D deficiency, unspecified: Secondary | ICD-10-CM

## 2023-10-30 NOTE — Progress Notes (Signed)
Established Patient Office Visit  Subjective   Patient ID: Jerry [name], male    DOB: [birth year]  Age: 37 y.o. MRN: [id number]  Chief Complaint  Patient presents with   Sickle Cell Anemia    Jerry [name] is a 37 year old male with a medical history significant for sickle cell disease, chronic pain syndrome, opiate dependence and tolerance, and anemia of chronic disease presents for follow-up of chronic conditions.  Patient says that he has been doing well and is without complaint on today. Patient has a long history of chronic pain secondary to his sickle cell disease that is primarily to his low back and lower extremities.  Pain has been well-controlled on oxycodone around every 4 hours as needed.  Patient last had this medication this a.m. with maximum relief.  He is not having any pain at this time. Patient denies any headache, chest pain, shortness of breath, urinary symptoms, nausea, vomiting, or diarrhea.    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 No pertinent surgical history. Social History   Tobacco Use   Smoking status: Every Day   Smokeless tobacco: Never  Vaping Use   Vaping status: Some Days   Substance Use Topics   Alcohol use: No    Comment: rare   Drug use: Yes    Types: Marijuana    Comment: occasionally   Social History   Socioeconomic History   Marital status: Single    Spouse name: Not on file   Number of children: Not on file   Years of education: Not on file   Highest education level: Not on file  Occupational History   Not on file  Tobacco Use   Smoking status: Every Day   Smokeless tobacco: Never  Vaping Use   Vaping status: Some Days  Substance and Sexual Activity   Alcohol use: No    Comment: rare   Drug use: Yes    Types: Marijuana    Comment: occasionally   Sexual activity: Yes    Birth control/protection: Condom  Other Topics Concern   Not on file  Social History Narrative   Not on file   Social Determinants of Health   Financial Resource Strain: Not on file  Food Insecurity: Not on file  Transportation Needs: Not on file  Physical Activity: Not on file  Stress: Not on file  Social Connections: Not on file  Intimate Partner Violence: Not on file   Family Status  Relation Name Status   Mother  Alive   Father  Alive   Sister  Alive   Sister  Alive  No partnership data on file   Family History  Problem Relation Age of Onset   Sickle  cell trait Mother    No Known Allergies    Review of Systems  Constitutional: Negative.   HENT: Negative.    Eyes: Negative.   Respiratory: Negative.    Cardiovascular: Negative.   Gastrointestinal: Negative.   Genitourinary: Negative.   Musculoskeletal:  Positive for back pain and joint pain.  Skin: Negative.   Neurological: Negative.   Psychiatric/Behavioral: Negative.        Objective:     BP (!) 93/54   Pulse 73   Temp (!) 97 F (36.1 C)   Wt 178 lb (80.7 kg)   SpO2 94%   BMI 24.83 kg/m  BP Readings from Last 3 Encounters:  10/30/23 (!) 93/54  06/19/23 (!) 102/58  12/04/22 129/79   Wt Readings from Last 3 Encounters:  10/30/23 178 lb (80.7 kg)  06/19/23 174 lb (78.9  kg)  12/04/22 176 lb (79.8 kg)    Physical Exam Constitutional:      Appearance: Normal appearance.  Eyes:     Pupils: Pupils are equal, round, and reactive to light.  Cardiovascular:     Rate and Rhythm: Normal rate and regular rhythm.     Pulses: Normal pulses.  Pulmonary:     Effort: Pulmonary effort is normal.  Abdominal:     General: Bowel sounds are normal.  Neurological:     General: No focal deficit present.     Mental Status: He is alert. Mental status is at baseline.  Psychiatric:        Mood and Affect: Mood normal.        Behavior: Behavior normal.        Thought Content: Thought content normal.        Judgment: Judgment normal.     No results found for any visits on 10/30/23.  Last CBC Lab Results  Component Value Date   WBC 12.9 (H) 06/19/2023   HGB 8.3 (L) 06/19/2023   HCT 24.2 (L) 06/19/2023   MCV 101 (H) 06/19/2023   MCH 34.6 (H) 06/19/2023   RDW 24.0 (H) 06/19/2023   PLT 350 06/19/2023   Last metabolic panel Lab Results  Component Value Date   GLUCOSE 79 06/19/2023   NA 140 06/19/2023   K 5.3 (H) 06/19/2023   CL 103 06/19/2023   CO2 17 (L) 06/19/2023   BUN 10 06/19/2023   CREATININE 1.03 06/19/2023   EGFR 98 06/19/2023   CALCIUM 9.6 06/19/2023   PROT 8.0 06/19/2023   ALBUMIN 4.6 06/19/2023   LABGLOB 3.4 06/19/2023   AGRATIO 1.4 03/26/2023   BILITOT 2.9 (H) 06/19/2023   ALKPHOS 93 06/19/2023   AST 53 (H) 06/19/2023   ALT 17 06/19/2023   ANIONGAP 5 03/05/2021   Last lipids Lab Results  Component Value Date   CHOL 120 10/24/2022   HDL 49 10/24/2022   LDLCALC 57 10/24/2022   TRIG 69 10/24/2022   CHOLHDL 2.4 10/24/2022   Last hemoglobin A1c No results found for: "HGBA1C" Last thyroid functions Lab Results  Component Value Date   TSH 0.809 07/04/2019   T4TOTAL 8.1 10/17/2018   Last vitamin D Lab Results  Component Value Date   VD25OH 31.7 06/19/2023   Last vitamin B12 and Folate Lab Results  Component Value Date    FOLATE 13.7 05/16/2022      The ASCVD Risk score (Arnett DK, et al., 2019) failed to calculate for the following reasons:   The 2019 ASCVD risk score is only valid for ages 37 to 69  Assessment & Plan:   Problem List Items Addressed This Visit       Other   Vitamin D deficiency   Relevant Orders   Sickle Cell Panel   Hb-SS disease without crisis [facility]) - Primary   Relevant Orders   Sickle Cell Panel   [id number] 11+Oxyco+Alc+Crt-Bund   Chronic prescription opiate use   Relevant Orders   [id number] 11+Oxyco+Alc+Crt-Bund  Jerry [name] is doing very well on his current medication regimen.  Will continue oxycodone 10 mg every 4 hours as needed for severe breakthrough pain.  Patient advised to add adjunct medications such as Tylenol and ibuprofen for mild to moderate pain. Continue folic acid 1 mg daily.  Patient advised to hydrate consistently with 32-64 ounces of water per day. He will continue to follow-up every 3 months for medication management.  Return in about 3 months (around 01/28/2024) for sickle cell anemia, birth control.   Nolon Nations  APRN, MSN, FNP-C Patient Care [facility] [address] [place], [place] [address] [phone]

## 2023-10-31 LAB — CMP14+CBC/D/PLT+FER+RETIC+V...
ALT: 15 [IU]/L (ref 0–44)
AST: 34 [IU]/L (ref 0–40)
Albumin: 4.6 g/dL (ref 4.1–5.1)
Alkaline Phosphatase: 85 [IU]/L (ref 44–121)
BUN/Creatinine Ratio: 9 (ref 9–20)
BUN: 9 mg/dL (ref 6–20)
Basophils Absolute: 0.1 10*3/uL (ref 0.0–0.2)
Basos: 1 %
Bilirubin Total: 3 mg/dL — ABNORMAL HIGH (ref 0.0–1.2)
CO2: 24 mmol/L (ref 20–29)
Calcium: 9.4 mg/dL (ref 8.7–10.2)
Chloride: 103 mmol/L (ref 96–106)
Creatinine, Ser: 0.96 mg/dL (ref 0.76–1.27)
EOS (ABSOLUTE): 0.4 10*3/uL (ref 0.0–0.4)
Eos: 4 %
Ferritin: 164 ng/mL (ref 30–400)
Globulin, Total: 3.2 g/dL (ref 1.5–4.5)
Glucose: 87 mg/dL (ref 70–99)
Hematocrit: 23.1 % — ABNORMAL LOW (ref 37.5–51.0)
Hemoglobin: 7.9 g/dL — ABNORMAL LOW (ref 13.0–17.7)
Immature Grans (Abs): 0.1 10*3/uL (ref 0.0–0.1)
Immature Granulocytes: 1 %
Lymphocytes Absolute: 4.1 10*3/uL — ABNORMAL HIGH (ref 0.7–3.1)
Lymphs: 39 %
MCH: 35.1 pg — ABNORMAL HIGH (ref 26.6–33.0)
MCHC: 34.2 g/dL (ref 31.5–35.7)
MCV: 103 fL — ABNORMAL HIGH (ref 79–97)
Monocytes Absolute: 1.6 10*3/uL — ABNORMAL HIGH (ref 0.1–0.9)
Monocytes: 15 %
NRBC: 2 % — ABNORMAL HIGH (ref 0–0)
Neutrophils Absolute: 4.3 10*3/uL (ref 1.4–7.0)
Neutrophils: 40 %
Platelets: 368 10*3/uL (ref 150–450)
Potassium: 4.7 mmol/L (ref 3.5–5.2)
RBC: 2.25 x10E6/uL — CL (ref 4.14–5.80)
RDW: 23 % — ABNORMAL HIGH (ref 11.6–15.4)
Retic Ct Pct: 18.7 % — ABNORMAL HIGH (ref 0.6–2.6)
Sodium: 139 mmol/L (ref 134–144)
Total Protein: 7.8 g/dL (ref 6.0–8.5)
Vit D, 25-Hydroxy: 23.7 ng/mL — ABNORMAL LOW (ref 30.0–100.0)
WBC: 10.5 10*3/uL (ref 3.4–10.8)
eGFR: 106 mL/min/{1.73_m2} (ref >59)

## 2023-11-04 LAB — DRUG SCREEN 764883 11+OXYCO+ALC+CRT-BUND
Amphetamines, Urine: NEGATIVE ng/mL
BENZODIAZ UR QL: NEGATIVE ng/mL
Barbiturate: NEGATIVE ng/mL
Cocaine (Metabolite): NEGATIVE ng/mL
Creatinine: 85.6 mg/dL (ref 20.0–300.0)
Ethanol: NEGATIVE %
Meperidine: NEGATIVE ng/mL
Methadone Screen, Urine: NEGATIVE ng/mL
OPIATE SCREEN URINE: NEGATIVE ng/mL
Phencyclidine: NEGATIVE ng/mL
Propoxyphene: NEGATIVE ng/mL
Tramadol: NEGATIVE ng/mL
pH, Urine: 5.2 (ref 4.5–8.9)

## 2023-11-04 LAB — OXYCODONE/OXYMORPHONE, CONFIRM
OXYCODONE/OXYMORPH: POSITIVE — AB
OXYCODONE: 320 ng/mL
OXYCODONE: POSITIVE — AB
OXYMORPHONE (GC/MS): 719 ng/mL
OXYMORPHONE: POSITIVE — AB

## 2023-11-04 LAB — CANNABINOID CONFIRMATION, UR
CANNABINOIDS: POSITIVE — AB
Carboxy THC GC/MS Conf: 174 ng/mL

## 2023-11-08 ENCOUNTER — Other Ambulatory Visit: Payer: Self-pay | Admitting: Family Medicine

## 2023-11-08 ENCOUNTER — Other Ambulatory Visit (HOSPITAL_COMMUNITY): Payer: Self-pay

## 2023-11-08 ENCOUNTER — Other Ambulatory Visit: Payer: Self-pay

## 2023-11-08 DIAGNOSIS — Z79891 Long term (current) use of opiate analgesic: Secondary | ICD-10-CM

## 2023-11-08 DIAGNOSIS — D571 Sickle-cell disease without crisis: Secondary | ICD-10-CM

## 2023-11-08 MED ORDER — OXYCODONE HCL 10 MG PO TABS
10.0000 mg | ORAL_TABLET | ORAL | 0 refills | Status: DC | PRN
  Filled 2023-11-08: qty 90, 15d supply, fill #0

## 2023-11-08 NOTE — Telephone Encounter (Signed)
Please advise KH 

## 2023-11-08 NOTE — Progress Notes (Signed)
Reviewed PDMP substance reporting system prior to prescribing opiate medications. No inconsistencies noted.  Meds ordered this encounter  Medications   Oxycodone HCl 10 MG TABS    Sig: Take 1 tablet (10 mg total) by mouth every 4 (four) hours as needed for up to 15 days    Dispense:  90 tablet    Refill:  0    Supervising Provider:   Quentin Angst [[id number]]   Nolon Nations  APRN, MSN, FNP-C Patient Care [facility] [address] [place], [place] [address] [phone]

## 2023-11-24 ENCOUNTER — Other Ambulatory Visit: Payer: Self-pay | Admitting: Family Medicine

## 2023-11-24 DIAGNOSIS — Z79891 Long term (current) use of opiate analgesic: Secondary | ICD-10-CM

## 2023-11-24 DIAGNOSIS — D571 Sickle-cell disease without crisis: Secondary | ICD-10-CM

## 2023-11-26 ENCOUNTER — Other Ambulatory Visit (HOSPITAL_COMMUNITY): Payer: Self-pay

## 2023-11-26 ENCOUNTER — Other Ambulatory Visit: Payer: Self-pay

## 2023-11-26 MED ORDER — OXYCODONE HCL 10 MG PO TABS
10.0000 mg | ORAL_TABLET | ORAL | 0 refills | Status: DC | PRN
  Filled 2023-11-26: qty 90, 15d supply, fill #0

## 2023-12-10 ENCOUNTER — Other Ambulatory Visit: Payer: Self-pay | Admitting: Internal Medicine

## 2023-12-10 DIAGNOSIS — D571 Sickle-cell disease without crisis: Secondary | ICD-10-CM

## 2023-12-10 DIAGNOSIS — Z79891 Long term (current) use of opiate analgesic: Secondary | ICD-10-CM

## 2023-12-10 MED ORDER — OXYCODONE HCL 10 MG PO TABS
10.0000 mg | ORAL_TABLET | ORAL | 0 refills | Status: DC | PRN
  Filled 2023-12-10: qty 90, 15d supply, fill #0

## 2023-12-11 ENCOUNTER — Other Ambulatory Visit (HOSPITAL_COMMUNITY): Payer: Self-pay

## 2023-12-24 ENCOUNTER — Other Ambulatory Visit (HOSPITAL_COMMUNITY): Payer: Self-pay

## 2023-12-24 ENCOUNTER — Other Ambulatory Visit: Payer: Self-pay | Admitting: Internal Medicine

## 2023-12-24 DIAGNOSIS — D571 Sickle-cell disease without crisis: Secondary | ICD-10-CM

## 2023-12-24 DIAGNOSIS — Z79891 Long term (current) use of opiate analgesic: Secondary | ICD-10-CM

## 2023-12-24 MED ORDER — OXYCODONE HCL 10 MG PO TABS
10.0000 mg | ORAL_TABLET | ORAL | 0 refills | Status: DC | PRN
  Filled 2023-12-26: qty 90, 15d supply, fill #0

## 2023-12-26 ENCOUNTER — Other Ambulatory Visit (HOSPITAL_COMMUNITY): Payer: Self-pay

## 2024-01-08 ENCOUNTER — Emergency Department (HOSPITAL_COMMUNITY)
Admission: EM | Admit: 2024-01-08 | Discharge: 2024-01-08 | Disposition: A | Discharge status: Home / Self Care | Payer: Self-pay | Attending: Emergency Medicine | Admitting: Emergency Medicine

## 2024-01-08 ENCOUNTER — Other Ambulatory Visit: Payer: Self-pay

## 2024-01-08 ENCOUNTER — Encounter (HOSPITAL_COMMUNITY): Payer: Self-pay

## 2024-01-08 ENCOUNTER — Telehealth (HOSPITAL_COMMUNITY): Payer: Self-pay

## 2024-01-08 ENCOUNTER — Emergency Department (HOSPITAL_COMMUNITY): Payer: Self-pay

## 2024-01-08 DIAGNOSIS — D5700 Hb-SS disease with crisis, unspecified: Secondary | ICD-10-CM | POA: Insufficient documentation

## 2024-01-08 LAB — COMPREHENSIVE METABOLIC PANEL
ALT: 19 U/L (ref 0–44)
AST: 55 U/L — ABNORMAL HIGH (ref 15–41)
Albumin: 4.8 g/dL (ref 3.5–5.0)
Alkaline Phosphatase: 62 U/L (ref 38–126)
Anion gap: 9 (ref 5–15)
BUN: 11 mg/dL (ref 6–20)
CO2: 23 mmol/L (ref 22–32)
Calcium: 9.1 mg/dL (ref 8.9–10.3)
Chloride: 107 mmol/L (ref 98–111)
Creatinine, Ser: 0.84 mg/dL (ref 0.61–1.24)
GFR, Estimated: >60 mL/min (ref >60)
Glucose, Bld: 84 mg/dL (ref 70–99)
Potassium: 4.3 mmol/L (ref 3.5–5.1)
Sodium: 139 mmol/L (ref 135–145)
Total Bilirubin: 3.7 mg/dL — ABNORMAL HIGH (ref 0.0–1.2)
Total Protein: 8.5 g/dL — ABNORMAL HIGH (ref 6.5–8.1)

## 2024-01-08 LAB — CBC WITH DIFFERENTIAL/PLATELET
Abs Immature Granulocytes: 0.29 10*3/uL — ABNORMAL HIGH (ref 0.00–0.07)
Basophils Absolute: 0.1 10*3/uL (ref 0.0–0.1)
Basophils Relative: 0 %
Eosinophils Absolute: 0.1 10*3/uL (ref 0.0–0.5)
Eosinophils Relative: 1 %
HCT: 21.7 % — ABNORMAL LOW (ref 39.0–52.0)
Hemoglobin: 7.8 g/dL — ABNORMAL LOW (ref 13.0–17.0)
Immature Granulocytes: 2 %
Lymphocytes Relative: 10 %
Lymphs Abs: 2.1 10*3/uL (ref 0.7–4.0)
MCH: 34.2 pg — ABNORMAL HIGH (ref 26.0–34.0)
MCHC: 35.9 g/dL (ref 30.0–36.0)
MCV: 95.2 fL (ref 80.0–100.0)
Monocytes Absolute: 2.4 10*3/uL — ABNORMAL HIGH (ref 0.1–1.0)
Monocytes Relative: 12 %
Neutro Abs: 15 10*3/uL — ABNORMAL HIGH (ref 1.7–7.7)
Neutrophils Relative %: 75 %
Platelets: 365 10*3/uL (ref 150–400)
RBC: 2.28 MIL/uL — ABNORMAL LOW (ref 4.22–5.81)
RDW: 26.5 % — ABNORMAL HIGH (ref 11.5–15.5)
Smear Review: NORMAL
WBC: 20 10*3/uL — ABNORMAL HIGH (ref 4.0–10.5)
nRBC: 2.3 % — ABNORMAL HIGH (ref 0.0–0.2)

## 2024-01-08 LAB — RETICULOCYTES: RBC.: 2.3 MIL/uL — ABNORMAL LOW (ref 4.22–5.81)

## 2024-01-08 MED ORDER — HYDROMORPHONE HCL 2 MG/ML IJ SOLN
2.0000 mg | INTRAMUSCULAR | Status: AC
  Administered 2024-01-08: 2 mg via INTRAVENOUS
  Filled 2024-01-08: qty 1

## 2024-01-08 MED ORDER — SODIUM CHLORIDE 0.45 % IV SOLN: INTRAVENOUS | Status: DC

## 2024-01-08 MED ORDER — KETOROLAC TROMETHAMINE 15 MG/ML IJ SOLN
15.0000 mg | INTRAMUSCULAR | Status: AC
  Administered 2024-01-08: 15 mg via INTRAVENOUS
  Filled 2024-01-08: qty 1

## 2024-01-08 NOTE — Discharge Instructions (Addendum)
Continue taking all your medications as prescribed.  Return to the emergency department for any fever or shortness of breath.

## 2024-01-08 NOTE — ED Provider Notes (Signed)
[place] EMERGENCY DEPARTMENT AT [facility] Provider Note   CSN: [id number] Arrival date & time: 01/08/24  1319     History  Chief Complaint  Patient presents with   Sickle Cell Pain Crisis    Jerry [name] is a 38 y.o. male.  38 year old male with a past medical history of sickle cell anemia and chronic pain syndrome presenting to the emergency department with sickle cell pain crisis.  The patient states that he woke up early this morning with diffuse pain from his neck all the way down to his legs.  He states that it feels like pain crises he is had in the past.  He states that the pain wraps around his ribs with associated shortness of breath.  He denies any fever or cough.  He denies any recent vomiting or diarrhea.  He states that he has been taking his home oxycodone without any relief.  He states that he tried to be seen at the sickle cell clinic today with they were full and recommended that he come to the emergency department.  The history is provided by the patient.  Sickle Cell Pain Crisis      Home Medications Prior to Admission medications   Medication Sig Start Date End Date Taking? Authorizing Provider  hydroxyurea (HYDREA) 500 MG capsule Take 3 capsules (1,500 mg total) by mouth daily. May take with food to minimize GI side effects. Patient not taking: Reported on 06/19/2023 10/24/22   Massie Maroon, FNP  ibuprofen (ADVIL) 800 MG tablet Take 1 tablet, 3 times a day as needed. Patient not taking: Reported on 06/19/2023 10/22/19   Kallie Locks, FNP  lisinopril (ZESTRIL) 2.5 MG tablet Take 1 tablet (2.5 mg total) by mouth daily. Patient not taking: Reported on 10/30/2023 05/16/22   Orion Crook I, NP  Oxycodone HCl 10 MG TABS Take 1 tablet (10 mg total) by mouth every 4 (four) hours as needed for up to 15 days 12/26/23 01/10/24  Quentin Angst, MD  promethazine (PHENERGAN) 12.5 MG tablet Take 1/2 tablet by mouth every 6 hours as needed  for nausea or vomiting. Patient not taking: Reported on 06/19/2023 07/21/21   Barbette Merino, NP  Vitamin D, Ergocalciferol, (DRISDOL) 1.25 MG (50000 UNIT) CAPS capsule Take 1 capsule (50,000 Units total) by mouth every 7 (seven) days. 03/28/23 03/27/24  Massie Maroon, FNP      Allergies    Patient has no known allergies.    Review of Systems   Review of Systems  Physical Exam Updated Vital Signs BP 133/84   Pulse 98   Temp 99.1 F (37.3 C) (Oral)   Resp 18   Ht 5\' 11"  (1.803 m)   Wt 80.7 kg   SpO2 100%   BMI 24.81 kg/m  Physical Exam Vitals and nursing note reviewed.  Constitutional:      General: He is not in acute distress.    Appearance: Normal appearance.     Comments: Uncomfortable appearing writhing around on the bed  HENT:     Head: Normocephalic and atraumatic.     Nose: Nose normal.     Mouth/Throat:     Mouth: Mucous membranes are moist.     Pharynx: Oropharynx is clear.  Eyes:     Extraocular Movements: Extraocular movements intact.     Conjunctiva/sclera: Conjunctivae normal.  Cardiovascular:     Rate and Rhythm: Normal rate and regular rhythm.     Heart sounds: Normal heart  sounds.  Pulmonary:     Effort: Pulmonary effort is normal.     Breath sounds: Normal breath sounds.  Abdominal:     General: Abdomen is flat.     Palpations: Abdomen is soft.     Tenderness: There is no abdominal tenderness.  Musculoskeletal:        General: Normal range of motion.     Cervical back: Normal range of motion.     Comments: Diffuse tenderness to patient's back  Skin:    General: Skin is warm and dry.  Neurological:     General: No focal deficit present.     Mental Status: He is alert and oriented to person, place, and time.     Sensory: No sensory deficit.     Motor: No weakness.  Psychiatric:        Mood and Affect: Mood normal.        Behavior: Behavior normal.     ED Results / Procedures / Treatments   Labs (all labs ordered are listed, but only  abnormal results are displayed) Labs Reviewed  COMPREHENSIVE METABOLIC PANEL - Abnormal; Notable for the following components:      Result Value   Total Protein 8.5 (*)    AST 55 (*)    Total Bilirubin 3.7 (*)    All other components within normal limits  CBC WITH DIFFERENTIAL/PLATELET - Abnormal; Notable for the following components:   WBC 20.0 (*)    RBC 2.28 (*)    Hemoglobin 7.8 (*)    HCT 21.7 (*)    MCH 34.2 (*)    RDW 26.5 (*)    nRBC 2.3 (*)    Neutro Abs 15.0 (*)    Monocytes Absolute 2.4 (*)    Abs Immature Granulocytes 0.29 (*)    All other components within normal limits  RETICULOCYTES    EKG EKG Interpretation Date/Time:  Tuesday January 08 2024 15:42:07 EST Ventricular Rate:  87 PR Interval:  215 QRS Duration:  96 QT Interval:  390 QTC Calculation: 470 R Axis:   64  Text Interpretation: Sinus rhythm Prolonged PR interval RSR' in V1 or V2, right VCD or RVH Probable left ventricular hypertrophy Borderline T abnormalities, lateral leads Borderline prolonged QT interval No significant change since last tracing Confirmed by Elayne Snare ([id number]) on 01/08/2024 4:11:13 PM  Radiology DG Chest Port 1 View Result Date: 01/08/2024 CLINICAL DATA:  Sickle cell pain. EXAM: PORTABLE CHEST 1 VIEW COMPARISON:  07/23/2021 FINDINGS: Heart is upper normal in size.The mediastinal contours are normal. The lungs are clear. Pulmonary vasculature is normal. No consolidation, pleural effusion, or pneumothorax. Sclerotic appearance of the osseous structures consistent with sickle cell. IMPRESSION: No active disease. Electronically Signed   By: Narda Rutherford M.D.   On: 01/08/2024 17:02    Procedures Procedures    Medications Ordered in ED Medications  0.45 % sodium chloride infusion (0 mLs Intravenous Stopped 01/08/24 1659)  ketorolac (TORADOL) 15 MG/ML injection 15 mg (15 mg Intravenous Given 01/08/24 1528)  HYDROmorphone (DILAUDID) injection 2 mg (2 mg Intravenous Given  01/08/24 1530)  HYDROmorphone (DILAUDID) injection 2 mg (2 mg Intravenous Given 01/08/24 1622)  HYDROmorphone (DILAUDID) injection 2 mg (2 mg Intravenous Given 01/08/24 1659)    ED Course/ Medical Decision Making/ A&P Clinical Course as of 01/08/24 1715  Tue Jan 08, 2024  1644 Anemia at baseline, increased leukocytosis. CXR pending read but no obvious consolidation. Reticulocyte count pending. [VK]  1714 Patient signed out to Dr. Andria Meuse  CXR read and retics and reassessment for disposition. [VK]    Clinical Course User Index [VK] Rexford Maus, DO                                 Medical Decision Making This patient presents to the ED with chief complaint(s) of diffuse body pain with pertinent past medical history of sickle cell anemia, chronic pain syndrome which further complicates the presenting complaint. The complaint involves an extensive differential diagnosis and also carries with it a high risk of complications and morbidity.    The differential diagnosis includes sickle cell pain crisis, acute chest syndrome, aplastic anemia, acute on chronic anemia, chronic pain  Additional history obtained: Additional history obtained from N/A Records reviewed outpatient sickle cell records  ED Course and Reassessment: On patient's arrival he is hemodynamically stable, uncomfortable appearing but in no acute distress.  Was initially evaluated in triage and had labs including reticulocyte count ordered.  With the patient's associated chest pain and shortness of breath will additionally EKG and chest x-ray to evaluate for acute chest syndrome or other complication.  He will be started on fluids and pain control and will be closely reassessed.  Independent labs interpretation:  The following labs were independently interpreted: leukocytosis, anemia at baseline  Independent visualization of imaging: - Pending     Amount and/or Complexity of Data Reviewed Radiology:  ordered.  Risk Prescription drug management.          Final Clinical Impression(s) / ED Diagnoses Final diagnoses:  None    Rx / DC Orders ED Discharge Orders     None         Rexford Maus, DO 01/08/24 1715

## 2024-01-08 NOTE — ED Triage Notes (Signed)
Pt arrived reporting scc pain all over that started today. States tried to go to the scc clinic however, they advised him to come to ED because they close at 4. Took regular meds, no relief.

## 2024-01-08 NOTE — ED Provider Notes (Signed)
  Physical Exam  BP 116/75   Pulse 78   Temp 99.1 F (37.3 C) (Oral)   Resp 17   Ht 5\' 11"  (1.803 m)   Wt 80.7 kg   SpO2 90%   BMI 24.81 kg/m   Physical Exam  Procedures  Procedures  ED Course / MDM   Clinical Course as of 01/08/24 1805  Tue Jan 08, 2024  1644 Anemia at baseline, increased leukocytosis. CXR pending read but no obvious consolidation. Reticulocyte count pending. [VK]  1714 Patient signed out to Dr. Andria Meuse CXR read and retics and reassessment for disposition. [VK]    Clinical Course User Index [VK] Rexford Maus, DO   Medical Decision Making Amount and/or Complexity of Data Reviewed Radiology: ordered.  Risk Prescription drug management.   Wardell Honour, assumed care for this patient.  In brief this is a 38 year old male presenting to the emergency room for sickle cell pain crisis.  Patient was signed out pending reevaluation.  Chest x-ray does not show any consolidation.  Patient not hypoxic.  Clear lung sounds.  Patient does have an elevated white count, however no fever.  Patient states that he is feeling a lot better, and wants to go home.  Return precautions discussed with patient, family, he will follow-up with his PCP.       Anders Simmonds T, DO 01/08/24 1806

## 2024-01-08 NOTE — Telephone Encounter (Signed)
Pt arrived in [facility] lobby accompanied by a male. Pt is trying to be seen at the day hospital. RN advised pt that it is too late in the day to be admitted to the day hospital today, and we are unable to admit pts after 12 PM. RN provided them with day hospital business card with the phone number, and advised that when pt feels he needs to be seen at day hospital he should call at 8 AM to be triaged. Per pt he is going to ER. Pt and companion provided with wheelchair.

## 2024-01-08 NOTE — ED Notes (Signed)
Unable to start IV x2.  Did get blood draw

## 2024-01-09 ENCOUNTER — Other Ambulatory Visit: Payer: Self-pay | Admitting: Internal Medicine

## 2024-01-09 DIAGNOSIS — Z79891 Long term (current) use of opiate analgesic: Secondary | ICD-10-CM

## 2024-01-09 DIAGNOSIS — D571 Sickle-cell disease without crisis: Secondary | ICD-10-CM

## 2024-01-10 ENCOUNTER — Telehealth: Payer: Self-pay | Admitting: Internal Medicine

## 2024-01-10 NOTE — Telephone Encounter (Signed)
Caller & Relationship to patient:   MRN #  [id number]   Call Back Number: [phone]  Date of Last Office Visit: 01/09/2024     Date of Next Office Visit: 01/30/2024    Medication(s) to be Refilled: Oxycodone 10mg   Preferred Pharmacy: [facility]  ** Please notify patient to allow 48-72 hours to process** **Let patient know to contact pharmacy at the end of the day to make sure medication is ready. ** **If patient has not been seen in a year or longer, book an appointment **Advise to use MyChart for refill requests OR to contact their pharmacy

## 2024-01-11 ENCOUNTER — Telehealth: Payer: Self-pay

## 2024-01-11 ENCOUNTER — Other Ambulatory Visit (HOSPITAL_COMMUNITY): Payer: Self-pay

## 2024-01-11 ENCOUNTER — Ambulatory Visit: Payer: Self-pay | Admitting: Family Medicine

## 2024-01-11 NOTE — Telephone Encounter (Signed)
  Chief Complaint: sickle cell pain Symptoms: pain  Frequency: comes and goes  Disposition: [] ED /[] Urgent Care (no appt availability in office) / [] Appointment(In office/virtual)/ []  [facility]/ [x] Home Care/ [] Refused Recommended Disposition /[] [facility] [facility]/ []  Follow-up with PCP Additional Notes: Pt calling for update on pain medication  request from yesterday.  Pt stated he was seen in ED on 2/11 for flare up. Pain has been manageable but only has 1 Oxycodone left. Pt states pain is 6-7 in back , ribs, and chest area. Pt denies any SOB or chest pain. RN reminded pt that refills have 2-3 days to be fulfilled. RN advised if pain increases to utilize [facility]. Pt prefers ED due to not having to report at certain time. RN called [facility] was told provider who fills meds is out. RN gave care advice and pt verbalized understanding.           Copied from CRM [id number]. Topic: Clinical - Red Word Triage >> Jan 11, 2024  4:05 PM Gaetano Hawthorne wrote: Red Word that prompted transfer to Nurse Triage: [facility] of [facility] calling in regards to his Oxycodone medication - he only has one pill left - he's not having a crisis right now but he is in pain - he would like to know what to do. Reason for Disposition  [1] MILD-MODERATE sickle cell pain (e.g., similar to prior pain episode, sickle cell attack) AND [2] present < 7 days  Answer Assessment - Initial Assessment Questions 1. ONSET: "When did the pain begin?"      2/7 2. LOCATION: "Where does it hurt?"      Mostly in back, ribs, chest 3. SEVERITY: "How bad is the pain?"  (e.g., Scale 1-10; mild, moderate, or severe)   - MILD (1-3): doesn't interfere with normal activities    - MODERATE (4-7): interferes with normal activities or awakens from sleep    - SEVERE (8-10): excruciating pain, unable to do any normal activities      6-7 4. PATTERN: "Is the pain constant?" (e.g., yes, no; constant,  intermittent)      Intensity comes and goes  5. CAUSE:  What do you think is causing the pain? Is this pain similar to the acute pain attacks you have had in the past? (e.g., similar location, quality, intensity)     Flare-up 6. PAIN MEDICINES:    - "Do you have a plan from your doctor for treating your acute pain episodes?" (e.g., pain management plan)   - "What have you taken so far for the pain?" (e.g., nothing, acetaminophen, NSAIDS, narcotic pain medicine).    - "How much has this helped relieve the pain?"    - "Have you done anything else to help the pain?" (e.g., heating pad)     Yes, oxycodone 7. OTHER SYMPTOMS: "Do you have any other symptoms?" (e.g., fever, chest pain, shortness of breath, new areas of swelling or redness)     denies  Protocols used: Sickle Cell Disease - Acute Pain Episode (Crisis)-A-AH

## 2024-01-11 NOTE — Telephone Encounter (Signed)
Copied from CRM [id number]. Topic: Clinical - Prescription Issue >> Jan 11, 2024  3:32 PM Geroge Baseman wrote: Reason for CRM: Oxycodone HCl 10 MG TABS not refilled. Patient states he needs them as he is in pain. He said he uses the [facility] card and will be self pay for the prescriptions he does not have insurance.  Will see Mitzi Davenport in the March

## 2024-01-14 ENCOUNTER — Other Ambulatory Visit: Payer: Self-pay | Admitting: Internal Medicine

## 2024-01-14 ENCOUNTER — Other Ambulatory Visit (HOSPITAL_COMMUNITY): Payer: Self-pay

## 2024-01-14 DIAGNOSIS — Z79891 Long term (current) use of opiate analgesic: Secondary | ICD-10-CM

## 2024-01-14 DIAGNOSIS — D571 Sickle-cell disease without crisis: Secondary | ICD-10-CM

## 2024-01-14 MED ORDER — OXYCODONE HCL 10 MG PO TABS
10.0000 mg | ORAL_TABLET | ORAL | 0 refills | Status: DC | PRN
  Filled 2024-01-14: qty 90, 15d supply, fill #0

## 2024-01-14 NOTE — Progress Notes (Signed)
Patient has called in for refill of his oxycodone 10 mg every 6 hours as needed.  Total of 90 tablets has been refilled today

## 2024-01-29 ENCOUNTER — Other Ambulatory Visit (HOSPITAL_COMMUNITY): Payer: Self-pay

## 2024-01-30 ENCOUNTER — Ambulatory Visit (INDEPENDENT_AMBULATORY_CARE_PROVIDER_SITE_OTHER): Payer: Self-pay | Admitting: Nurse Practitioner

## 2024-01-30 ENCOUNTER — Other Ambulatory Visit (HOSPITAL_COMMUNITY): Payer: Self-pay

## 2024-01-30 ENCOUNTER — Encounter: Payer: Self-pay | Admitting: Nurse Practitioner

## 2024-01-30 VITALS — BP 113/53 | HR 92 | Temp 97.6°F | Wt 173.0 lb

## 2024-01-30 DIAGNOSIS — F17200 Nicotine dependence, unspecified, uncomplicated: Secondary | ICD-10-CM

## 2024-01-30 DIAGNOSIS — E559 Vitamin D deficiency, unspecified: Secondary | ICD-10-CM

## 2024-01-30 DIAGNOSIS — F1210 Cannabis abuse, uncomplicated: Secondary | ICD-10-CM

## 2024-01-30 DIAGNOSIS — R801 Persistent proteinuria, unspecified: Secondary | ICD-10-CM

## 2024-01-30 DIAGNOSIS — D571 Sickle-cell disease without crisis: Secondary | ICD-10-CM

## 2024-01-30 DIAGNOSIS — Z79891 Long term (current) use of opiate analgesic: Secondary | ICD-10-CM

## 2024-01-30 LAB — POCT URINALYSIS DIP (CLINITEK)
Bilirubin, UA: NEGATIVE
Glucose, UA: NEGATIVE mg/dL
Ketones, POC UA: NEGATIVE mg/dL
Leukocytes, UA: NEGATIVE
Nitrite, UA: NEGATIVE
POC PROTEIN,UA: 100 — AB
Spec Grav, UA: 1.015 (ref 1.010–1.025)
Urobilinogen, UA: 1 U/dL
pH, UA: 6 (ref 5.0–8.0)

## 2024-01-30 MED ORDER — IBUPROFEN 800 MG PO TABS
ORAL_TABLET | ORAL | 6 refills | Status: DC
  Filled 2024-01-30: qty 30, 10d supply, fill #0

## 2024-01-30 MED ORDER — VITAMIN D (ERGOCALCIFEROL) 1.25 MG (50000 UNIT) PO CAPS
50000.0000 [IU] | ORAL_CAPSULE | ORAL | 3 refills | Status: DC
  Filled 2024-01-30 – 2024-02-27 (×3): qty 12, 84d supply, fill #0

## 2024-01-30 MED ORDER — OXYCODONE HCL 10 MG PO TABS
10.0000 mg | ORAL_TABLET | ORAL | 0 refills | Status: DC | PRN
  Filled 2024-01-30: qty 90, 15d supply, fill #0

## 2024-01-30 MED ORDER — FOLIC ACID 1 MG PO TABS
1.0000 mg | ORAL_TABLET | Freq: Every day | ORAL | 1 refills | Status: DC
  Filled 2024-01-30 – 2024-02-27 (×3): qty 90, 90d supply, fill #0
  Filled 2024-06-04 – 2024-07-30 (×2): qty 90, 90d supply, fill #1

## 2024-01-30 NOTE — Assessment & Plan Note (Signed)
 Cessation encouraged

## 2024-01-30 NOTE — Assessment & Plan Note (Signed)
Smokes about  less than 0.5 pack/day  Asked about quitting: confirms that he/she currently smokes cigarettes Advise to quit smoking: Educated about QUITTING to reduce the risk of cancer, cardio and cerebrovascular disease. Assess willingness: Unwilling to quit at this time, but is working on cutting back. Assist with counseling and pharmacotherapy: Counseled for 5 minutes and literature provided. Arrange for follow up: follow up in 3 months and continue to offer help.  

## 2024-01-30 NOTE — Assessment & Plan Note (Addendum)
 Lab Results  Component Value Date   COLORU yellow 01/30/2024   CLARITYU clear 01/30/2024   GLUCOSEUR negative 01/30/2024   BILIRUBINUR negative 01/30/2024   KETONESU negative 07/12/2020   SPECGRAV 1.015 01/30/2024   RBCUR moderate (A) 01/30/2024   PHUR 6.0 01/30/2024   PROTEINUR Positive (A) 06/14/2020   UROBILINOGEN 1.0 01/30/2024   LEUKOCYTESUR Negative 01/30/2024  Was on lisinopril 2.5 mg daily but not currently Blood pressure is normal in the office, will check urine microalbumin level and order Marcelline Deist if elevated

## 2024-01-30 NOTE — Progress Notes (Signed)
 New Patient Office Visit  Subjective:  Patient ID: Jerry [name], male    DOB: [birth year]  Age: 38 y.o. MRN: [id number]  CC:  Chief Complaint  Patient presents with   Sickle Cell Anemia    HPI Jerry [name] is a 38 y.o. 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  Patient presented establish care for his chronic medical conditions.  Previous PCP Julianne Handler NP  Sickle cell disease.  Patient is currently not established with hematologist, has a prescription for hydroxyurea that he is not taking, not taking folic acid.  Takes ibuprofen 800 mg 3 times daily for mild pain and oxycodone 10 mg every 4 hours as needed for moderate to severe pain.  Oxycodone was last taken last night.  He is sickle cell pain is mainly in his back, pain is currently rated 3/10.  Has infrequent sickle cell crisis, states that he has been trying to avoid taking hydroxyurea . Currently denies fever, chills, chest pain, shortness of breath, wheezing, abdominal pain, nausea, vomiting  Tobacco use disorder.  States that 1 pack of cigarettes lasts him about 3 days, started smoking at age 25.  He denies cough, shortness of breath, wheezing  Vitamin D deficiency.  Currently not on vitamin D supplements  Due for influenza vaccine, pneumococcal vaccine, need for both vaccines discussed patient encouraged to consider getting the vaccine    Past Medical History:  Diagnosis Date   Chronic prescription opiate use 10/07/2019   GALLBLADDER DISEASE 02/07/2007   Annotation: Sludge without cholelithiasis  Qualifier: Diagnosis of   By: End MD, Cristal Deer      Replacing diagnoses that were inactivated after the 02/26/23 regulatory import     MARIJUANA ABUSE 02/15/2007   Qualifier: Diagnosis of   By: End MD, Cristal Deer      Replacing diagnoses that were  inactivated after the 02/26/23 regulatory import     Persistent proteinuria 10/07/2019   Sickle cell anemia (HCC)     History reviewed. No pertinent surgical history.  Family History  Problem Relation Age of Onset   Sickle cell trait Mother    Sickle cell trait Father     Social History   Socioeconomic History   Marital status: Married    Spouse name: Not on file   Number of children: Not on file   Years of education: Not on file   Highest education level: Not on file  Occupational History   Not on file  Tobacco Use   Smoking status: Every Day   Smokeless tobacco: Never  Vaping Use   Vaping status: Some Days  Substance and Sexual Activity   Alcohol use: No    Comment: rare   Drug use: Yes    Types: Marijuana    Comment: occasionally   Sexual activity: Yes    Birth control/protection: Condom  Other Topics Concern   Not on file  Social History Narrative   Lives with his wife    Social Drivers of Corporate investment banker Strain: Not on file  Food Insecurity: Not on file  Transportation Needs: Not on file  Physical Activity: Not on file  Stress: Not on file  Social Connections: Not on file  Intimate Partner Violence: Not on file    ROS Review of Systems  Constitutional:  Negative for appetite change, chills, fatigue and fever.  HENT:  Negative for  congestion, postnasal drip, rhinorrhea and sneezing.   Respiratory:  Negative for cough, shortness of breath and wheezing.   Cardiovascular:  Negative for chest pain, palpitations and leg swelling.  Gastrointestinal:  Negative for abdominal pain, constipation, nausea and vomiting.  Genitourinary:  Negative for difficulty urinating, dysuria, flank pain and frequency.  Musculoskeletal:  Negative for arthralgias, back pain, joint swelling and myalgias.  Skin:  Negative for color change, pallor, rash and wound.  Neurological:  Negative for dizziness, facial asymmetry, weakness, numbness and headaches.   Psychiatric/Behavioral:  Negative for behavioral problems, confusion, self-injury and suicidal ideas.     Objective:   Today's Vitals: BP (!) 113/53   Pulse 92   Temp 97.6 F (36.4 C)   Wt 173 lb (78.5 kg)   SpO2 91%   BMI 24.13 kg/m   Physical Exam Vitals and nursing note reviewed.  Constitutional:      General: He is not in acute distress.    Appearance: Normal appearance. He is not ill-appearing, toxic-appearing or diaphoretic.  HENT:     Mouth/Throat:     Mouth: Mucous membranes are moist.     Pharynx: Oropharynx is clear. No oropharyngeal exudate or posterior oropharyngeal erythema.  Eyes:     General: Scleral icterus present.        Right eye: No discharge.        Left eye: No discharge.     Extraocular Movements: Extraocular movements intact.     Conjunctiva/sclera: Conjunctivae normal.  Cardiovascular:     Rate and Rhythm: Normal rate and regular rhythm.     Pulses: Normal pulses.     Heart sounds: Normal heart sounds. No murmur heard.    No friction rub. No gallop.  Pulmonary:     Effort: Pulmonary effort is normal. No respiratory distress.     Breath sounds: Normal breath sounds. No stridor. No wheezing, rhonchi or rales.  Chest:     Chest wall: No tenderness.  Abdominal:     General: There is no distension.     Palpations: Abdomen is soft.     Tenderness: There is no abdominal tenderness. There is no right CVA tenderness, left CVA tenderness or guarding.  Musculoskeletal:        General: No swelling, deformity or signs of injury.     Right lower leg: No edema.     Left lower leg: No edema.  Skin:    General: Skin is warm and dry.     Capillary Refill: Capillary refill takes less than 2 seconds.     Coloration: Skin is not jaundiced or pale.     Findings: No bruising, erythema or lesion.  Neurological:     Mental Status: He is alert and oriented to person, place, and time.     Motor: No weakness.     Coordination: Coordination normal.     Gait: Gait  normal.  Psychiatric:        Mood and Affect: Mood normal.        Behavior: Behavior normal.        Thought Content: Thought content normal.        Judgment: Judgment normal.     Assessment & Plan:   Problem List Items Addressed This Visit       Other   TOBACCO ABUSE   Smokes about less than 0.5 pack/day  Asked about quitting: confirms that he/she currently smokes cigarettes Advise to quit smoking: Educated about QUITTING to reduce the risk of cancer, cardio  and cerebrovascular disease. Assess willingness: Unwilling to quit at this time, but is working on cutting back. Assist with counseling and pharmacotherapy: Counseled for 5 minutes and literature provided. Arrange for follow up: follow up in 3 months and continue to offer help.       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 Folic acid 1 mg daily to prevent aplastic bone marrow crises.  Will check sickle cell panel for absolute neutrophil count and platelets.   We discussed the need for good hydration, monitoring of hydration status, avoidance of heat, cold, stress, and infection triggers. We discussed the  benefits of Hydrea,  The patient was reminded of the need to seek medical attention of any symptoms of bleeding, anemia, or infection.  Patient declined referral to hematologist   Pulmonary evaluation - Patient denies severe recurrent wheezes, shortness of breath with exercise, or persistent cough.    Eye - High risk of proliferative retinopathy. Annual eye exam with retinal exam recommended to patient.  Immunization status - Patient will receive influenza vaccination on today  Acute and chronic painful episodes - .  We discussed that pt is to receive her Schedule II prescriptions only from [place]. Pt is also aware that the prescription history is available to [place] online through the [facility] CSRS. We  reminded the patient that all patients receiving Schedule II narcotics must be seen for follow within the 3 months in the office.  We reviewed the terms of our pain agreement, including the need to keep medicines in a safe locked location away from children or pets, and the need to report excess sedation or constipation, measures to avoid constipation, and policies related to early refills and stolen prescriptions. According to the [place] Chronic Pain Initiative program, we have reviewed details related to analgesia, adverse effects, aberrant behaviors. Reviewed  Substance Reporting system prior to prescribing opiate medication, no inconsistencies noted.  Opioid prescription agreement form completed today  -  continue ibuprofen (ADVIL) 800 MG tablet; Take 1 tablet by mouth three times daily as needed for mild pain.  Dispense: 30 tablet; Refill: 6 - Ambulatory referral to Ophthalmology - POCT URINALYSIS DIP (CLINITEK) - Sickle Cell Panel - 161096 11+Oxyco+Alc+Crt-Bund - Oxycodone HCl 10 MG TABS; Take 1 tablet (10 mg total) by mouth every 4 (four) hours as needed for moderate pain  for up to 15 days (up to 15 days).  Dispense: 90 tablet; Refill: 0 - folic acid (FOLVITE) 1 MG tablet; Take 1 tablet (1 mg total) by mouth daily.  Dispense: 90 tablet; Refill: 1       Relevant Medications   ibuprofen (ADVIL) 800 MG tablet   Oxycodone HCl 10 MG TABS   folic acid (FOLVITE) 1 MG tablet   Other Relevant Orders   Ambulatory referral to Ophthalmology   POCT URINALYSIS DIP (CLINITEK) (Completed)   Sickle Cell Panel   045409 11+Oxyco+Alc+Crt-Bund   Vitamin D deficiency   Last vitamin D Lab Results  Component Value Date   VD25OH 23.7 (L) 10/30/2023  Vitamin D50 000 units once weekly refilled      Relevant Medications   Vitamin D, Ergocalciferol, (DRISDOL) 1.25 MG (50000 UNIT) CAPS capsule   Chronic prescription opiate use   Relevant Medications   Oxycodone HCl 10 MG TABS   Persistent proteinuria    Lab Results  Component Value Date  COLORU yellow 01/30/2024   CLARITYU clear 01/30/2024   GLUCOSEUR negative 01/30/2024   BILIRUBINUR negative 01/30/2024   KETONESU negative 07/12/2020   SPECGRAV 1.015 01/30/2024   RBCUR moderate (A) 01/30/2024   PHUR 6.0 01/30/2024   PROTEINUR Positive (A) 06/14/2020   UROBILINOGEN 1.0 01/30/2024   LEUKOCYTESUR Negative 01/30/2024  Was on lisinopril 2.5 mg daily but not currently Blood pressure is normal in the office, will check urine microalbumin level and order Farxiga if elevated      Relevant Orders   Microalbumin / creatinine urine ratio    Outpatient Encounter Medications as of 01/30/2024  Medication Sig   folic acid (FOLVITE) 1 MG tablet Take 1 tablet (1 mg total) by mouth daily.   [DISCONTINUED] Oxycodone HCl 10 MG TABS Take 1 tablet (10 mg total) by mouth every 4 (four) hours as needed for up to 15 days   [DISCONTINUED] Vitamin D, Ergocalciferol, (DRISDOL) 1.25 MG (50000 UNIT) CAPS capsule Take 1 capsule (50,000 Units total) by mouth every 7 (seven) days.   hydroxyurea (HYDREA) 500 MG capsule Take 3 capsules (1,500 mg total) by mouth daily. May take with food to minimize GI side effects. (Patient not taking: Reported on 01/30/2024)   ibuprofen (ADVIL) 800 MG tablet Take 1 tablet by mouth three times daily as needed.   lisinopril (ZESTRIL) 2.5 MG tablet Take 1 tablet (2.5 mg total) by mouth daily. (Patient not taking: Reported on 01/30/2024)   Oxycodone HCl 10 MG TABS Take 1 tablet (10 mg total) by mouth every 4 (four) hours as needed for up to 15 days (up to 15 days).   promethazine (PHENERGAN) 12.5 MG tablet Take 1/2 tablet by mouth every 6 hours as needed for nausea or vomiting. (Patient not taking: Reported on 01/30/2024)   Vitamin D, Ergocalciferol, (DRISDOL) 1.25 MG (50000 UNIT) CAPS capsule Take 1 capsule (50,000 Units total) by mouth every 7 (seven) days.   [DISCONTINUED] ibuprofen (ADVIL) 800 MG tablet Take 1 tablet, 3 times a day as  needed. (Patient not taking: Reported on 01/30/2024)   No facility-administered encounter medications on file as of 01/30/2024.    Follow-up: Return in about 3 months (around 05/01/2024) for CPE.   Donell Beers, FNP

## 2024-01-30 NOTE — Patient Instructions (Signed)
 At a minimum, adult patients with SCD should complete the following primary series (per 2014 SCD Evidence Based Guidelines):  One Dose of PCV13 (Prevnar) followed by PPSV23 (Pneumovax) at least 8 weeks later   One Dose of Haemophilus Influenzae type B (HIB), followed by One Dose of MenACWY (Menveo) one month later  Two Doses of MenB (Bexero) one month apart (this can be given at the same time as HIB and/or MenACWY visits)  We also recommend that patients receive:  Annual Influenza Vaccine  Booster of PPSV23 (Pneumovax) every 5 years  Booster of MenACWY (Menveo) every 5 years  We also suggest for specific patients:  Immunization for Hepatitis B (Twinrix at 0, 1 and 6 month intervals) for patients receiving regular blood transfusions, history of HIV, current or recent drug use or incarceration  Tdap booster every 10 years  Tdap booster for all pregnant women  If no history of primary vaccination series for tetanus, diphtheria, or pertussis:   At least 1 dose Tdap followed by 1 dose Td or Tdap at least 4 weeks later, another dose Td or Tdap 6-12 months later, and a booster every 10 years  1 dose Tdap during each pregnancy     1. Hb-SS disease without crisis (HCC)  - ibuprofen (ADVIL) 800 MG tablet; Take 1 tablet, 3 times a day as needed.  Dispense: 30 tablet; Refill: 6 - Ambulatory referral to Ophthalmology - POCT URINALYSIS DIP (CLINITEK) - Sickle Cell Panel - 161096 11+Oxyco+Alc+Crt-Bund - Oxycodone HCl 10 MG TABS; Take 1 tablet (10 mg total) by mouth every 4 (four) hours as needed for up to 15 days (up to 15 days).  Dispense: 90 tablet; Refill: 0 - folic acid (FOLVITE) 1 MG tablet; Take 1 tablet (1 mg total) by mouth daily.  Dispense: 90 tablet; Refill: 1  2. Vitamin D deficiency  - Vitamin D, Ergocalciferol, (DRISDOL) 1.25 MG (50000 UNIT) CAPS capsule; Take 1 capsule (50,000 Units total) by mouth every 7 (seven) days.  Dispense: 12 capsule; Refill: 3  3. Chronic prescription opiate  use  - Oxycodone HCl 10 MG TABS; Take 1 tablet (10 mg total) by mouth every 4 (four) hours as needed for up to 15 days (up to 15 days).  Dispense: 90 tablet; Refill: 0   It is important that you exercise regularly at least 30 minutes 5 times a week as tolerated  Think about what you will eat, plan ahead. Choose " clean, green, fresh or frozen" over canned, processed or packaged foods which are more sugary, salty and fatty. 70 to 75% of food eaten should be vegetables and fruit. Three meals at set times with snacks allowed between meals, but they must be fruit or vegetables. Aim to eat over a 12 hour period , example 7 am to 7 pm, and STOP after  your last meal of the day. Drink water,generally about 64 ounces per day, no other drink is as healthy. Fruit juice is best enjoyed in a healthy way, by EATING the fruit.  Thanks for choosing [facility] we consider it a privelige to serve you.

## 2024-01-30 NOTE — Assessment & Plan Note (Addendum)
 Last vitamin D Lab Results  Component Value Date   VD25OH 23.7 (L) 10/30/2023  Vitamin D50 000 units once weekly refilled

## 2024-01-30 NOTE — Assessment & Plan Note (Addendum)
 Sickle cell disease -patient encouraged to consider taking Hydrea 1500 mg daily. Folic acid 1 mg daily to prevent aplastic bone marrow crises.  Will check sickle cell panel for absolute neutrophil count and platelets.   We discussed the need for good hydration, monitoring of hydration status, avoidance of heat, cold, stress, and infection triggers. We discussed the  benefits of Hydrea,  The patient was reminded of the need to seek medical attention of any symptoms of bleeding, anemia, or infection.  Patient declined referral to hematologist   Pulmonary evaluation - Patient denies severe recurrent wheezes, shortness of breath with exercise, or persistent cough.    Eye - High risk of proliferative retinopathy. Annual eye exam with retinal exam recommended to patient.  Immunization status - Patient will receive influenza vaccination on today  Acute and chronic painful episodes - .  We discussed that pt is to receive her Schedule II prescriptions only from [place]. Pt is also aware that the prescription history is available to [place] online through the [facility] CSRS. We reminded the patient that all patients receiving Schedule II narcotics must be seen for follow within the 3 months in the office.  We reviewed the terms of our pain agreement, including the need to keep medicines in a safe locked location away from children or pets, and the need to report excess sedation or constipation, measures to avoid constipation, and policies related to early refills and stolen prescriptions. According to the [place] Chronic Pain Initiative program, we have reviewed details related to analgesia, adverse effects, aberrant behaviors. Reviewed [place] Substance Reporting system prior to prescribing opiate medication, no inconsistencies noted.  Opioid prescription agreement form completed today  -  continue ibuprofen (ADVIL) 800 MG tablet; Take 1 tablet by mouth three times daily as needed for mild pain.  Dispense: 30 tablet; Refill: 6 -  Ambulatory referral to Ophthalmology - POCT URINALYSIS DIP (CLINITEK) - Sickle Cell Panel - 161096 11+Oxyco+Alc+Crt-Bund - Oxycodone HCl 10 MG TABS; Take 1 tablet (10 mg total) by mouth every 4 (four) hours as needed for moderate pain  for up to 15 days (up to 15 days).  Dispense: 90 tablet; Refill: 0 - folic acid (FOLVITE) 1 MG tablet; Take 1 tablet (1 mg total) by mouth daily.  Dispense: 90 tablet; Refill: 1

## 2024-01-31 LAB — CMP14+CBC/D/PLT+FER+RETIC+V...
ALT: 21 IU/L (ref 0–44)
AST: 43 IU/L — ABNORMAL HIGH (ref 0–40)
Albumin: 5 g/dL (ref 4.1–5.1)
Alkaline Phosphatase: 89 IU/L (ref 44–121)
BUN/Creatinine Ratio: 11 (ref 9–20)
BUN: 11 mg/dL (ref 6–20)
Basophils Absolute: 0.1 10*3/uL (ref 0.0–0.2)
Basos: 1 %
Bilirubin Total: 3.6 mg/dL — ABNORMAL HIGH (ref 0.0–1.2)
CO2: 22 mmol/L (ref 20–29)
Calcium: 9.6 mg/dL (ref 8.7–10.2)
Chloride: 105 mmol/L (ref 96–106)
Creatinine, Ser: 1.03 mg/dL (ref 0.76–1.27)
EOS (ABSOLUTE): 0.2 10*3/uL (ref 0.0–0.4)
Eos: 2 %
Ferritin: 203 ng/mL (ref 30–400)
Globulin, Total: 3.2 g/dL (ref 1.5–4.5)
Glucose: 84 mg/dL (ref 70–99)
Hematocrit: 22.9 % — ABNORMAL LOW (ref 37.5–51.0)
Hemoglobin: 7.8 g/dL — ABNORMAL LOW (ref 13.0–17.7)
Immature Grans (Abs): 0 10*3/uL (ref 0.0–0.1)
Immature Granulocytes: 0 %
Lymphocytes Absolute: 3 10*3/uL (ref 0.7–3.1)
Lymphs: 27 %
MCH: 34.1 pg — ABNORMAL HIGH (ref 26.6–33.0)
MCHC: 34.1 g/dL (ref 31.5–35.7)
MCV: 100 fL — ABNORMAL HIGH (ref 79–97)
Monocytes Absolute: 1.8 10*3/uL — ABNORMAL HIGH (ref 0.1–0.9)
Monocytes: 16 %
NRBC: 1 % — ABNORMAL HIGH (ref 0–0)
Neutrophils Absolute: 5.9 10*3/uL (ref 1.4–7.0)
Neutrophils: 54 %
Platelets: 392 10*3/uL (ref 150–450)
Potassium: 5.1 mmol/L (ref 3.5–5.2)
RBC: 2.29 x10E6/uL — CL (ref 4.14–5.80)
RDW: 22.8 % — ABNORMAL HIGH (ref 11.6–15.4)
Retic Ct Pct: 13.1 % — ABNORMAL HIGH (ref 0.6–2.6)
Sodium: 140 mmol/L (ref 134–144)
Total Protein: 8.2 g/dL (ref 6.0–8.5)
Vit D, 25-Hydroxy: 20.9 ng/mL — ABNORMAL LOW (ref 30.0–100.0)
WBC: 11 10*3/uL — ABNORMAL HIGH (ref 3.4–10.8)
eGFR: 97 mL/min/{1.73_m2} (ref >59)

## 2024-02-04 LAB — DRUG SCREEN 764883 11+OXYCO+ALC+CRT-BUND
Amphetamines, Urine: NEGATIVE ng/mL
BENZODIAZ UR QL: NEGATIVE ng/mL
Barbiturate: NEGATIVE ng/mL
Cocaine (Metabolite): NEGATIVE ng/mL
Creatinine: 156.2 mg/dL (ref 20.0–300.0)
Ethanol: NEGATIVE %
Meperidine: NEGATIVE ng/mL
Methadone Screen, Urine: NEGATIVE ng/mL
OPIATE SCREEN URINE: NEGATIVE ng/mL
Phencyclidine: NEGATIVE ng/mL
Propoxyphene: NEGATIVE ng/mL
Tramadol: NEGATIVE ng/mL
pH, Urine: 5.5 (ref 4.5–8.9)

## 2024-02-04 LAB — CANNABINOID CONFIRMATION, UR
CANNABINOIDS: POSITIVE — AB
Carboxy THC GC/MS Conf: 149 ng/mL

## 2024-02-04 LAB — OXYCODONE/OXYMORPHONE, CONFIRM
OXYCODONE/OXYMORPH: POSITIVE — AB
OXYCODONE: NEGATIVE
OXYMORPHONE (GC/MS): 485 ng/mL
OXYMORPHONE: POSITIVE — AB

## 2024-02-06 LAB — MICROALBUMIN / CREATININE URINE RATIO

## 2024-02-07 LAB — MICROALBUMIN / CREATININE URINE RATIO
Creatinine, Urine: 154.7 mg/dL
Microalb/Creat Ratio: 316 mg/g{creat} — ABNORMAL HIGH (ref 0–29)
Microalbumin, Urine: 489.1 ug/mL

## 2024-02-07 LAB — SPECIMEN STATUS REPORT

## 2024-02-08 ENCOUNTER — Other Ambulatory Visit: Payer: Self-pay | Admitting: Nurse Practitioner

## 2024-02-08 ENCOUNTER — Other Ambulatory Visit (HOSPITAL_COMMUNITY): Payer: Self-pay

## 2024-02-08 DIAGNOSIS — R809 Proteinuria, unspecified: Secondary | ICD-10-CM

## 2024-02-08 DIAGNOSIS — R801 Persistent proteinuria, unspecified: Secondary | ICD-10-CM

## 2024-02-08 LAB — MICROALBUMIN / CREATININE URINE RATIO

## 2024-02-08 LAB — SPECIMEN STATUS REPORT

## 2024-02-08 MED ORDER — DAPAGLIFLOZIN PROPANEDIOL 10 MG PO TABS
10.0000 mg | ORAL_TABLET | Freq: Every day | ORAL | 2 refills | Status: DC
  Filled 2024-02-08 – 2024-02-27 (×3): qty 30, 30d supply, fill #0

## 2024-02-11 ENCOUNTER — Telehealth: Payer: Self-pay

## 2024-02-11 NOTE — Telephone Encounter (Signed)
-----   Message from [place] sent at 02/08/2024  8:21 PM EDT ----- Done, thanks ----- Message ----- From: Renelda Loma, RMA Sent: 01/30/2024   4:17 PM EDT To: Donell Beers, FNP

## 2024-02-11 NOTE — Telephone Encounter (Signed)
 Called pt to advise referral was place and to answer any unknown numbers .  Pt will also check the web site for coupon. KH

## 2024-02-12 ENCOUNTER — Telehealth: Payer: Self-pay | Admitting: *Deleted

## 2024-02-12 DIAGNOSIS — D571 Sickle-cell disease without crisis: Secondary | ICD-10-CM

## 2024-02-12 DIAGNOSIS — D5700 Hb-SS disease with crisis, unspecified: Secondary | ICD-10-CM

## 2024-02-12 NOTE — Progress Notes (Signed)
 Care Guide Pharmacy Note  02/12/2024 Name: DERREON CONSALVO MRN: [id number] DOB: [birth year]  Referred By: Donell Beers, FNP Reason for referral: Care Coordination (Initial outreach to schedule referral with PharmD POD 6)   Yostin ATA PECHA is a 38 y.o. year old male who is a primary care patient of Donell Beers, FNP.  [name] was referred to the pharmacist for assistance related to: Persistent proteinuria and Microalbuminuria for Farxiga  Successful contact was made with the patient to discuss pharmacy services including being ready for the pharmacist to call at least 5 minutes before the scheduled appointment time and to have medication bottles and any blood pressure readings ready for review. The patient agreed to meet with the pharmacist via telephone visit on (date/time). 4/1 at 900 AM  Gwenevere Ghazi  [facility], [facility] Guide  Direct Dial: [phone]  Fax [phone]

## 2024-02-13 ENCOUNTER — Telehealth: Payer: Self-pay | Admitting: *Deleted

## 2024-02-13 NOTE — Progress Notes (Signed)
 Complex Care Management Note Care Guide Note  02/13/2024 Name: Jerry [name] MRN: [id number] DOB: 1986/08/04  Jerry [name] is a 38 y.o. year old male who is a primary care patient of Donell Beers, FNP . The community resource team was consulted for assistance with Food Insecurity and Financial Difficulties related to medicine , medicaid   SDOH screenings and interventions completed:  Yes  Social Drivers of Health From This Encounter   Food Insecurity: Food Insecurity Present (02/13/2024)   Hunger Vital Sign    Worried About Running Out of Food in the Last Year: Sometimes true    Ran Out of Food in the Last Year: Sometimes true  Housing: Unknown (02/13/2024)   Housing Stability Vital Sign    Unable to Pay for Housing in the Last Year: No    Homeless in the Last Year: No  Financial Resource Strain: Medium Risk (02/13/2024)   Overall Financial Resource Strain (CARDIA)    Difficulty of Paying Living Expenses: Somewhat hard  Transportation Needs: No Transportation Needs (02/13/2024)   PRAPARE - Administrator, Civil Service (Medical): No    Lack of Transportation (Non-Medical): No  Utilities: Not At Risk (02/13/2024)   Utilities    Threatened with loss of utilities: No    SDOH Interventions Today    Flowsheet Row Most Recent Value  SDOH Interventions   Food Insecurity Interventions AMB Referral, Community Resources Provided, [id number] Referral  [Will email resources for food and other needs as discussed]  Housing Interventions Intervention Not Indicated  [might needs housing soon will mail housing options]  Transportation Interventions Intervention Not Indicated  Utilities Interventions Intervention Not Indicated  Financial Strain Interventions Community Resources Provided  [employer] email resources for food and other needs as discussed]        Care guide performed the following interventions: Patient provided with information about care guide support team  and interviewed to confirm resource needs.  Follow Up Plan:  No further follow up planned at this time. The patient has been provided with needed resources.  Encounter Outcome:  Patient Visit Completed  Dione Booze  [facility] Guide  Direct Dial:[phone] Fax:[phone] Website: [email or web address]

## 2024-02-14 ENCOUNTER — Other Ambulatory Visit: Payer: Self-pay | Admitting: Nurse Practitioner

## 2024-02-14 DIAGNOSIS — D571 Sickle-cell disease without crisis: Secondary | ICD-10-CM

## 2024-02-14 DIAGNOSIS — Z79891 Long term (current) use of opiate analgesic: Secondary | ICD-10-CM

## 2024-02-14 NOTE — Telephone Encounter (Signed)
 Please advise [facility]

## 2024-02-15 ENCOUNTER — Other Ambulatory Visit: Payer: Self-pay | Admitting: Nurse Practitioner

## 2024-02-15 ENCOUNTER — Telehealth: Payer: Self-pay | Admitting: Nurse Practitioner

## 2024-02-15 ENCOUNTER — Other Ambulatory Visit (HOSPITAL_COMMUNITY): Payer: Self-pay

## 2024-02-15 DIAGNOSIS — D571 Sickle-cell disease without crisis: Secondary | ICD-10-CM

## 2024-02-15 DIAGNOSIS — Z79891 Long term (current) use of opiate analgesic: Secondary | ICD-10-CM

## 2024-02-15 MED ORDER — OXYCODONE HCL 10 MG PO TABS
10.0000 mg | ORAL_TABLET | ORAL | 0 refills | Status: DC | PRN
  Filled 2024-02-15: qty 90, 15d supply, fill #0

## 2024-02-15 NOTE — Telephone Encounter (Signed)
 Copied from CRM [id number]. Topic: Clinical - Medication Refill >> Feb 15, 2024  9:44 AM Fuller Canada P wrote: Most Recent Primary Care Visit:  Provider: Donell Beers  Department: [facility]  Visit Type: OFFICE VISIT  Date: 01/30/2024  Medication: Oxycodone HCl 10 MG TABS  Has the patient contacted their pharmacy? Yes (Agent: If no, request that the patient contact the pharmacy for the refill. If patient does not wish to contact the pharmacy document the reason why and proceed with request.) (Agent: If yes, when and what did the pharmacy advise?)  Is this the correct pharmacy for this prescription? Yes If no, delete pharmacy and type the correct one.  This is the patient's preferred pharmacy:  [facility] [address] [place] [address] Phone: [phone] Fax: [phone]    Has the prescription been filled recently? Yes, 15 day supply last filled 03/05  Is the patient out of the medication? Yes, pt is one day past due for his meds  Has the patient been seen for an appointment in the last year OR does the patient have an upcoming appointment? Yes  Can we respond through MyChart? Yes  Agent: Please be advised that Rx refills may take up to 3 business days. We ask that you follow-up with your pharmacy.

## 2024-02-15 NOTE — Telephone Encounter (Signed)
 2nd request for this medication.   Copied from CRM [id number]. Topic: Clinical - Medication Refill >> Feb 15, 2024  9:44 AM Fuller Canada P wrote: Most Recent Primary Care Visit:  Provider: Donell Beers  Department: [facility]  Visit Type: OFFICE VISIT  Date: 01/30/2024  Medication: Oxycodone HCl 10 MG TABS  Has the patient contacted their pharmacy? Yes (Agent: If no, request that the patient contact the pharmacy for the refill. If patient does not wish to contact the pharmacy document the reason why and proceed with request.) (Agent: If yes, when and what did the pharmacy advise?)  Is this the correct pharmacy for this prescription? Yes If no, delete pharmacy and type the correct one.  This is the patient's preferred pharmacy:  [facility] [address] [place] [address] Phone: [phone] Fax: [phone]    Has the prescription been filled recently? Yes, 15 day supply last filled 03/05  Is the patient out of the medication? Yes, pt is one day past due for his meds  Has the patient been seen for an appointment in the last year OR does the patient have an upcoming appointment? Yes  Can we respond through MyChart? Yes  Agent: Please be advised that Rx refills may take up to 3 business days. We ask that you follow-up with your pharmacy.

## 2024-02-15 NOTE — Progress Notes (Signed)
 Reviewed PDMP substance reporting system prior to prescribing opiate medications. No inconsistencies noted.   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  Dispense: 90 tablet; Refill: 0  2. Chronic prescription opiate use  - Oxycodone HCl 10 MG TABS; Take 1 tablet (10 mg total) by mouth every 4 (four) hours as needed for up to 15 days (up to 15 days).  Dispense: 90 tablet; Refill: 0

## 2024-02-26 ENCOUNTER — Other Ambulatory Visit: Payer: Self-pay

## 2024-02-26 DIAGNOSIS — Z79899 Other long term (current) drug therapy: Secondary | ICD-10-CM

## 2024-02-26 NOTE — Progress Notes (Signed)
 02/26/2024 Name: Jerry [name] MRN: [id number] DOB: November 16, 1986  Chief Complaint  Patient presents with   Medication Management    Jerry [name] is a 38 y.o. year old male who presented for a telephone visit.   They were referred to the pharmacist by their PCP for assistance in medication access.   Subjective:  Care Team: Primary Care Provider: Donell Beers, FNP ; Next Scheduled Visit: 04/29/2024  Medication Access/Adherence  Current Pharmacy:  [facility] [name] [facility] - [facility] [address] [name] [address] Phone: [phone] Fax: [phone]  [id number] [place] [name] [name] [address] Phone: [phone] Fax: [phone]   Patient reports affordability concerns with their medications: Yes  Jerry [name] and other medications and would like Medicaid  Patient reports access/transportation concerns to their pharmacy: No  Patient reports adherence concerns with their medications:  Yes  Due to cost of medication and takes certain medications when he feels is needed   Medication Access He seems upset that he isn't "consulted" about medications and would like communication from his PCP but he emphasize he appreciates her care. He reports that he has not spoke with Child psychotherapist about the application process for Medicaid and remains interested in getting insurance. Discussed there are a few medications that could be filled at [facility] that could be at no cost such vitamin D 50,000 units, losartan (in place of lisinopril), and folic acid. Although Ibuprofen is a medication that is availeble on the formulary he reports that he does not take the medication. - He confirms he is uninsured and employed  - He has not connected with nephrologist   Medication Compliance  He reports that he stops taking medications when he does not understand why he is taking certain  medications. He stopped taking lisinopril, for about 1.5 years, since he did not understand that it was for his kidneys.   Objective:  No results found for: "HGBA1C"  Lab Results  Component Value Date   CREATININE 1.03 01/30/2024   BUN 11 01/30/2024   NA 140 01/30/2024   K 5.1 01/30/2024   CL 105 01/30/2024   CO2 22 01/30/2024    Lab Results  Component Value Date   CHOL 120 10/24/2022   HDL 49 10/24/2022   LDLCALC 57 10/24/2022   TRIG 69 10/24/2022   CHOLHDL 2.4 10/24/2022    Medications Reviewed Today     Reviewed by Katha Cabal, RPH (Pharmacist) on 02/26/24 at 0945  Med List Status: <None>   Medication Order Taking? Sig Documenting Provider Last Dose Status Informant  dapagliflozin propanediol (Jerry [name]) 10 MG TABS tablet [id number] No Take 1 tablet (10 mg total) by mouth daily before breakfast.  Patient not taking: Reported on 02/26/2024   Donell Beers, FNP Not Taking Active            Med Note Para March, [facility] R   Tue Feb 26, 2024  9:07 AM) Due to cost of medication   folic acid (FOLVITE) 1 MG tablet [id number] No Take 1 tablet (1 mg total) by mouth daily.  Patient not taking: Reported on 02/26/2024   Donell Beers, FNP Not Taking Active   hydroxyurea (HYDREA) 500 MG capsule [id number] No Take 3 capsules (1,500 mg total) by mouth daily. May take with food to minimize GI side effects.  Patient not taking: Reported on 06/19/2023   Massie Maroon, FNP Not Taking Active  ibuprofen (ADVIL) 800 MG tablet [id number] Yes Take 1 tablet by mouth three times daily as needed. Donell Beers, FNP Taking Active   lisinopril (ZESTRIL) 2.5 MG tablet [id number] No Take 1 tablet (2.5 mg total) by mouth daily.  Patient not taking: Reported on 10/30/2023   Orion Crook I, NP Not Taking Active   Oxycodone HCl 10 MG TABS [id number] Yes Take 1 tablet (10 mg total) by mouth every 4 (four) hours as needed for up to 15 days (up to 15 days). Donell Beers, FNP Taking  Active   promethazine (PHENERGAN) 12.5 MG tablet [id number] No Take 1/2 tablet by mouth every 6 hours as needed for nausea or vomiting.  Patient not taking: Reported on 06/19/2023   Barbette Merino, NP Not Taking Active   Vitamin D, Ergocalciferol, (DRISDOL) 1.25 MG (50000 UNIT) CAPS capsule [id number] No Take 1 capsule (50,000 Units total) by mouth every 7 (seven) days.  Patient not taking: Reported on 02/26/2024   Donell Beers, FNP Not Taking Active            Med Note Para March, [facility] R   Tue Feb 26, 2024  9:20 AM) Leanora Ivanoff when PCP tells him. I informed patient that his vitamin D level is slightly low              Assessment/Plan:   Medication Access:  - Will have patient contact office for assistance in Medicaid application as well as contact Tyson Dense for Sickle Cell Medicaid. As such, it is unclear whether Jerry [name] will be covered with this type of Medicaid. Will follow up on coverage of Jerry [name], [facility] assistance, and collaborate with PCP. Will help to get patient established with [facility]) to help with medication cost while Medicaid application is pending. Will collaborate with PCP on sending prescription, if needed to the appropriate pharmacy and therapeutic changes.  - Encouraged patient to contact nephrologist to schedule and appointment  Medication Management: - Currently strategy insufficient to maintain appropriate adherence to prescribed medication regimen - Suggested patient medications as prescribed, except for major adverse drug reactions, and to contact PCP/provider when he does not understand the indication of certain medications - May consider discontinuing ibuprofen due to renal concerns as well as patient is not taking medication.     Follow Up Plan: 1-2 weeks   Cephus Shelling, PharmD Clinical Pharmacist Cell: [phone]

## 2024-02-26 NOTE — Patient Instructions (Addendum)
 Mr. Redner,   [place] has recently expanded access to [facility]. [place] is offering assistance for patients seeking to apply for Medicaid to connect in-person with [facility] DSS Medicaid Eligibility and Enrollment Specialists. With no appointment necessary, individuals can simply walk in and receive assistance from an available caseworker. They will provide help in submitting applications for Medicaid.   Where: [facility] [address] [phone]. East in [place]; fourth floor) When: Mondays through Fridays between 8am and 5pm (closed 12:30-1:30pm for lunch)  You will need to call Tyson Dense at [phone] and that is for the [facility]. You could also try [phone].   Visit [email or web address] to learn more about eligibility and how to apply.   Please reach out to nephrologist to schedule an appointment if you have already.  [facility]  [address] [place] [address]  P:  [phone]   Cephus Shelling, PharmD Clinical Pharmacist Cell: [phone]

## 2024-02-27 ENCOUNTER — Telehealth: Payer: Self-pay

## 2024-02-27 ENCOUNTER — Other Ambulatory Visit (HOSPITAL_COMMUNITY): Payer: Self-pay

## 2024-02-27 ENCOUNTER — Other Ambulatory Visit: Payer: Self-pay

## 2024-02-27 DIAGNOSIS — Z79899 Other long term (current) drug therapy: Secondary | ICD-10-CM

## 2024-02-27 NOTE — Progress Notes (Addendum)
   02/27/2024  Patient ID: Jerry [name], male   DOB: October 10, 1986, 38 y.o.   MRN: [id number]  Care Coordination Call  I called [facility]) and transferred vitamin D , folic acid , Farxiga  (30 days coupon card used)  to their pharmacy for the medications to be dispensed via the [facility] while [place] application is pending.   Alexandria Angel, PharmD Clinical Pharmacist Cell: [phone]

## 2024-02-28 ENCOUNTER — Other Ambulatory Visit: Payer: Self-pay | Admitting: Nurse Practitioner

## 2024-02-28 ENCOUNTER — Other Ambulatory Visit: Payer: Self-pay

## 2024-02-28 DIAGNOSIS — D571 Sickle-cell disease without crisis: Secondary | ICD-10-CM

## 2024-02-28 DIAGNOSIS — Z79891 Long term (current) use of opiate analgesic: Secondary | ICD-10-CM

## 2024-02-28 NOTE — Progress Notes (Signed)
 Called pt lvm for pt to call back. KH

## 2024-02-29 ENCOUNTER — Other Ambulatory Visit: Payer: Self-pay | Admitting: Nurse Practitioner

## 2024-02-29 ENCOUNTER — Other Ambulatory Visit (HOSPITAL_COMMUNITY): Payer: Self-pay

## 2024-02-29 DIAGNOSIS — D571 Sickle-cell disease without crisis: Secondary | ICD-10-CM

## 2024-02-29 DIAGNOSIS — Z79891 Long term (current) use of opiate analgesic: Secondary | ICD-10-CM

## 2024-02-29 MED ORDER — OXYCODONE HCL 10 MG PO TABS
10.0000 mg | ORAL_TABLET | ORAL | 0 refills | Status: DC | PRN
  Filled 2024-03-01: qty 90, 15d supply, fill #0

## 2024-02-29 NOTE — Progress Notes (Unsigned)
 Reviewed PDMP substance reporting system prior to prescribing opiate medications. No inconsistencies noted.   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  Dispense: 90 tablet; Refill: 0  2. Chronic prescription opiate use  - Oxycodone HCl 10 MG TABS; Take 1 tablet (10 mg total) by mouth every 4 (four) hours as needed for up to 15 days (up to 15 days).  Dispense: 90 tablet; Refill: 0

## 2024-02-29 NOTE — Progress Notes (Signed)
 Called again no answer and could not lvm  and second attempt

## 2024-03-01 ENCOUNTER — Other Ambulatory Visit (HOSPITAL_COMMUNITY): Payer: Self-pay

## 2024-03-04 NOTE — Progress Notes (Signed)
KH

## 2024-03-04 NOTE — Progress Notes (Signed)
 Pt was advised [facility]

## 2024-03-06 ENCOUNTER — Other Ambulatory Visit: Payer: Self-pay

## 2024-03-11 ENCOUNTER — Other Ambulatory Visit: Payer: Self-pay

## 2024-03-11 DIAGNOSIS — Z79899 Other long term (current) drug therapy: Secondary | ICD-10-CM

## 2024-03-11 NOTE — Progress Notes (Signed)
 A user error has taken place: encounter opened in error, closed for administrative reasons.  Cephus Shelling, PharmD Clinical Pharmacist Cell: [phone]

## 2024-03-11 NOTE — Progress Notes (Signed)
   03/11/2024 Name: Jerry [name] MRN: [id number] DOB: 1986/05/04  Chief Complaint  Patient presents with   Medication Management    Jerry [name] is a 38 y.o. year old male who presented for a telephone visit.   They were referred to the pharmacist by their PCP for assistance in managing medication access.    Subjective:  Care Team: Primary Care Provider: Paseda, Folashade R, FNP ; Next Scheduled Visit: 04/29/2024   Medication Access/Adherence  Current Pharmacy:  Melodee Spruce [facility] - [facility] [address] [place] [address] Phone: [phone] Fax: [phone]  [id number] [place] [place] - [address] [place] [address] Phone: [phone] Fax: [phone]   The patient reported that he spoke with someone about Medicaid and was given an application to complete. Initially, he stated he received only general information, but after further discussion, he clarified that he needs to submit check stubs and will be set up with temporary insurance,once that is completed and processed, that requires annual renewal.  He also noted he was unable to complete income verification for the [facility] due to inconsistent staffing demands/income. Although he planned to reach out to [place], he has not yet done so.  The patient picked up [place] and other medications and reports taking them as prescribed, though he admitted to missing a dose of folic acid and Farxiga, which he took the following day with breakfast.  He has not yet scheduled an appointment with the nephrologist and mentioned that the call back from the [facility] did not match the number shown on his caller ID.   Objective:  No results found for: "HGBA1C"  Lab Results  Component Value Date   CREATININE 1.03 01/30/2024   BUN 11 01/30/2024   NA 140 01/30/2024   K 5.1 01/30/2024   CL 105 01/30/2024   CO2 22 01/30/2024    Lab  Results  Component Value Date   CHOL 120 10/24/2022   HDL 49 10/24/2022   LDLCALC 57 10/24/2022   TRIG 69 10/24/2022   CHOLHDL 2.4 10/24/2022    Medications Reviewed Today   Medications were not reviewed in this encounter       Assessment/Plan:   - Reviewed how manage a missed dose of medications.  - Encouraged the patient to submit pay stubs to the appropriate person for temporary insurance. Explained [facility] is supposed to be short term.  - Encouraged patient to contact [facility] to schedule his own appointment - Encouraged patient that needs to contact Radene Buffalo, CPhT - Encouraged patient to continue to take medications as prescribed    Follow Up Plan: 2-4 weeks  Alexandria Angel, PharmD Clinical Pharmacist Cell: [phone]

## 2024-03-11 NOTE — Patient Instructions (Addendum)
 Mr. Defranco,   I was great speaking with you today. Please take the time to contact [facility] their telephone number is [phone] to schedule an appointment; they are located at [address] [place] [address].   Please contact Radene Buffalo regarding the pharmacy program service that is giving your medications at no charge or a discount fee. It is likely this will not be long term and used until you get approved for insurance (such as Medicaid). Please also submit your pay stub to Natalie Boner, who you may have spoke with.   As always, please take your medications as prescribed and will call you in a couple of weeks.   Alexandria Angel, PharmD Clinical Pharmacist Cell: [phone]

## 2024-03-12 ENCOUNTER — Other Ambulatory Visit: Payer: Self-pay

## 2024-03-14 ENCOUNTER — Other Ambulatory Visit: Payer: Self-pay

## 2024-03-14 ENCOUNTER — Telehealth: Payer: Self-pay

## 2024-03-14 NOTE — Telephone Encounter (Signed)
 Submitted application for FARXIGA to [employer] for patient assistance.   Phone: [phone]

## 2024-03-17 ENCOUNTER — Other Ambulatory Visit: Payer: Self-pay

## 2024-03-20 ENCOUNTER — Other Ambulatory Visit: Payer: Self-pay

## 2024-03-24 ENCOUNTER — Other Ambulatory Visit: Payer: Self-pay | Admitting: Nurse Practitioner

## 2024-03-24 ENCOUNTER — Other Ambulatory Visit (HOSPITAL_COMMUNITY): Payer: Self-pay

## 2024-03-24 DIAGNOSIS — D571 Sickle-cell disease without crisis: Secondary | ICD-10-CM

## 2024-03-24 DIAGNOSIS — Z79891 Long term (current) use of opiate analgesic: Secondary | ICD-10-CM

## 2024-03-24 MED ORDER — OXYCODONE HCL 10 MG PO TABS
10.0000 mg | ORAL_TABLET | ORAL | 0 refills | Status: DC | PRN
  Filled 2024-03-24: qty 90, 15d supply, fill #0

## 2024-03-24 NOTE — Progress Notes (Signed)
 Reviewed PDMP substance reporting system prior to prescribing opiate medications. No inconsistencies noted.   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  Dispense: 90 tablet; Refill: 0  2. Chronic prescription opiate use  - Oxycodone HCl 10 MG TABS; Take 1 tablet (10 mg total) by mouth every 4 (four) hours as needed for up to 15 days (up to 15 days).  Dispense: 90 tablet; Refill: 0

## 2024-03-25 ENCOUNTER — Other Ambulatory Visit (HOSPITAL_COMMUNITY): Payer: Self-pay

## 2024-04-01 ENCOUNTER — Telehealth: Payer: Self-pay

## 2024-04-01 ENCOUNTER — Other Ambulatory Visit: Payer: Self-pay

## 2024-04-01 DIAGNOSIS — Z5986 Financial insecurity: Secondary | ICD-10-CM

## 2024-04-01 NOTE — Progress Notes (Signed)
   04/01/2024  Patient ID: Jerry [name], male   DOB: Apr 10, 1986, 38 y.o.   MRN: [id number]  Attempted to contact patient for medication management/review. Left HIPAA compliant message for patient to return my call at their convenience.   This was the first attempt to outreach the patient. Follow-up was conducted to verify whether the patient had received Medicaid. Chart review indicates that the patient's application was submitted for the [employer] program for Farxiga , and the medication has been received at no cost through discount card for a one-time fill. The patient was also enrolled in the [facility] program in the interim while patient applies to Medicaid. Outreach was made to assess whether the patient had any additional unmet needs related to medication assistance.  Followed up application status for Farxiga , with Radene Buffalo, CPhT, and she will contact company.   Thank you for allowing pharmacy to be a part of this patient's care.  Alexandria Angel, PharmD Clinical Pharmacist Cell: [phone]

## 2024-04-04 ENCOUNTER — Telehealth: Payer: Self-pay

## 2024-04-04 ENCOUNTER — Other Ambulatory Visit: Payer: Self-pay

## 2024-04-04 NOTE — Telephone Encounter (Signed)
 Received notification from [employer] regarding approval for FARXIGA . Patient assistance approved from 04/03/2024 to 04/03/2025.  Medication will ship to [address], [place], [place] [address]  Pt ID: [id number]  Company phone: [phone]

## 2024-04-09 ENCOUNTER — Other Ambulatory Visit (HOSPITAL_COMMUNITY): Payer: Self-pay

## 2024-04-09 ENCOUNTER — Other Ambulatory Visit: Payer: Self-pay | Admitting: Nurse Practitioner

## 2024-04-09 DIAGNOSIS — Z79891 Long term (current) use of opiate analgesic: Secondary | ICD-10-CM

## 2024-04-09 DIAGNOSIS — D571 Sickle-cell disease without crisis: Secondary | ICD-10-CM

## 2024-04-09 MED ORDER — OXYCODONE HCL 10 MG PO TABS
10.0000 mg | ORAL_TABLET | ORAL | 0 refills | Status: DC | PRN
  Filled 2024-04-09: qty 90, 15d supply, fill #0

## 2024-04-09 NOTE — Telephone Encounter (Signed)
 Hi Natalie,   That is great! Then that should wrap up the referral. I appreciate your help.   Alexandria Angel, PharmD Clinical Pharmacist Cell: [phone]

## 2024-04-29 ENCOUNTER — Ambulatory Visit (INDEPENDENT_AMBULATORY_CARE_PROVIDER_SITE_OTHER): Payer: Self-pay | Admitting: Nurse Practitioner

## 2024-04-29 ENCOUNTER — Other Ambulatory Visit: Payer: Self-pay | Admitting: Nurse Practitioner

## 2024-04-29 ENCOUNTER — Other Ambulatory Visit (HOSPITAL_COMMUNITY): Payer: Self-pay

## 2024-04-29 ENCOUNTER — Encounter: Payer: Self-pay | Admitting: Nurse Practitioner

## 2024-04-29 VITALS — BP 122/66 | HR 88 | Temp 97.5°F | Wt 175.0 lb

## 2024-04-29 DIAGNOSIS — Z79891 Long term (current) use of opiate analgesic: Secondary | ICD-10-CM

## 2024-04-29 DIAGNOSIS — N469 Male infertility, unspecified: Secondary | ICD-10-CM | POA: Insufficient documentation

## 2024-04-29 DIAGNOSIS — D571 Sickle-cell disease without crisis: Secondary | ICD-10-CM

## 2024-04-29 DIAGNOSIS — Z0000 Encounter for general adult medical examination without abnormal findings: Secondary | ICD-10-CM | POA: Insufficient documentation

## 2024-04-29 DIAGNOSIS — Z13220 Encounter for screening for lipoid disorders: Secondary | ICD-10-CM | POA: Insufficient documentation

## 2024-04-29 DIAGNOSIS — R809 Proteinuria, unspecified: Secondary | ICD-10-CM

## 2024-04-29 DIAGNOSIS — E559 Vitamin D deficiency, unspecified: Secondary | ICD-10-CM

## 2024-04-29 DIAGNOSIS — F17200 Nicotine dependence, unspecified, uncomplicated: Secondary | ICD-10-CM

## 2024-04-29 DIAGNOSIS — F1210 Cannabis abuse, uncomplicated: Secondary | ICD-10-CM

## 2024-04-29 MED ORDER — VITAMIN D (ERGOCALCIFEROL) 1.25 MG (50000 UNIT) PO CAPS
50000.0000 [IU] | ORAL_CAPSULE | ORAL | 3 refills | Status: DC
  Filled 2024-04-29 – 2024-07-30 (×3): qty 12, 84d supply, fill #0

## 2024-04-29 MED ORDER — OXYCODONE HCL 10 MG PO TABS
10.0000 mg | ORAL_TABLET | ORAL | 0 refills | Status: DC | PRN
  Filled 2024-04-29: qty 90, 15d supply, fill #0

## 2024-04-29 NOTE — Assessment & Plan Note (Signed)
 He and his wife have been trying to conceive  for the past 5 years,he would like referral to a urologist referral placed

## 2024-04-29 NOTE — Patient Instructions (Addendum)
 [address] [place] [address]  P:  [phone] F:  [phone]  It is important that you exercise regularly at least 30 minutes 5 times a week as tolerated  Think about what you will eat, plan ahead. Choose " clean, green, fresh or frozen" over canned, processed or packaged foods which are more sugary, salty and fatty. 70 to 75% of food eaten should be vegetables and fruit. Three meals at set times with snacks allowed between meals, but they must be fruit or vegetables. Aim to eat over a 12 hour period , example 7 am to 7 pm, and STOP after  your last meal of the day. Drink water,generally about 64 ounces per day, no other drink is as healthy. Fruit juice is best enjoyed in a healthy way, by EATING the fruit.  Thanks for choosing [facility] we consider it a privelige to serve you.

## 2024-04-29 NOTE — Assessment & Plan Note (Addendum)
 Annual exam as documented.  Counseling done include healthy lifestyle involving committing to 150 minutes of exercise per week, heart healthy diet, and maintaining a healthy weight. The importance of adequate sleep also discussed.  Regular use of seat belt and home safety were also discussed . Changes in health habits are decided on by patient with goals and time frames set for achieving them. Immunization and cancer screening  needs are specifically addressed at this visit.   Encouraged to get pneumococcal vaccine at the pharmacy

## 2024-04-29 NOTE — Assessment & Plan Note (Signed)
Need to avoid smoking marijuana discussed 

## 2024-04-29 NOTE — Progress Notes (Signed)
 Complete physical exam  Patient: Jerry [name]   DOB: 10-Jun-1986   38 y.o. Male  MRN: [id number]  Subjective:     Chief Complaint  Patient presents with   Sickle Cell Anemia   Annual Exam    Jerry [name] is a 38 y.o. 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 who presents today for a complete physical exam. He reports consuming a general diet  does weight lifting and walking exercise daily    He generally feels well.    Sickle cell disease.  Has infrequent sickle cell crisis .currently on folic acid  1 mg daily.  Not taking hydroxyurea  due to cost.  For chronic sickle cell pain takes oxycodone  10 mg every 4 hours as needed.  Oxycodone  was last taken yesterday his pain is currently rated 3/10 and it is in his back.  He currently denies fever, chills, chest pain, shortness of breath, abdominal pain, nausea, vomiting        Most recent fall risk assessment:    07/25/2022   10:53 AM  Fall Risk   Falls in the past year? 0  Number falls in past yr: 0  Injury with Fall? 0  Risk for fall due to : No Fall Risks  Follow up Falls evaluation completed     Most recent depression screenings:    04/29/2024   12:10 PM 01/30/2024    1:16 PM  PHQ 2/9 Scores  PHQ - 2 Score 0 0  PHQ- 9 Score 1 0        Patient Care Team: Emine Lopata R, FNP as PCP - General (Nurse Practitioner)   Outpatient Medications Prior to Visit  Medication Sig   dapagliflozin  propanediol (FARXIGA ) 10 MG TABS tablet Take 1 tablet (10 mg total) by mouth daily before breakfast.   folic acid  (FOLVITE ) 1 MG tablet Take 1 tablet (1 mg total) by mouth daily.   Vitamin D , Ergocalciferol , (DRISDOL ) 1.25 MG (50000 UNIT) CAPS capsule Take 1 capsule (50,000 Units total) by mouth every 7 (seven) days.   [DISCONTINUED] Oxycodone  HCl 10 MG  TABS Take 1 tablet (10 mg total) by mouth every 4 (four) hours as needed for up to 15 days (up to 15 days).   hydroxyurea  (HYDREA ) 500 MG capsule Take 3 capsules (1,500 mg total) by mouth daily. May take with food to minimize GI side effects. (Patient not taking: Reported on 04/29/2024)   ibuprofen  (ADVIL ) 800 MG tablet Take 1 tablet by mouth three times daily as needed. (Patient not taking: Reported on 04/29/2024)   No facility-administered medications prior to visit.    Review of Systems  Constitutional:  Negative for appetite change, chills, fatigue and fever.  HENT:  Negative for congestion, postnasal drip, rhinorrhea and sneezing.   Eyes:  Negative for pain, discharge and itching.  Respiratory:  Negative for cough, shortness of breath and wheezing.   Cardiovascular:  Negative for chest pain, palpitations and leg swelling.  Gastrointestinal:  Negative for abdominal pain, constipation, nausea and vomiting.  Endocrine: Negative for cold intolerance, heat intolerance and polydipsia.  Genitourinary:  Negative for difficulty urinating, dysuria, flank pain and frequency.  Musculoskeletal:  Positive for back pain. Negative for joint swelling and myalgias.  Skin:  Negative for color change, pallor, rash and wound.  Neurological:  Negative for dizziness, facial asymmetry, weakness, numbness and headaches.  Psychiatric/Behavioral:  Negative for  behavioral problems, confusion, self-injury and suicidal ideas.        Objective:     BP 122/66   Pulse 88   Temp (!) 97.5 F (36.4 C)   Wt 175 lb (79.4 kg)   SpO2 94%   BMI 24.41 kg/m    Physical Exam Vitals and nursing note reviewed. Exam conducted with a chaperone present.  Constitutional:      General: He is not in acute distress.    Appearance: Normal appearance. He is obese. He is not ill-appearing, toxic-appearing or diaphoretic.  HENT:     Right Ear: Tympanic membrane, ear canal and external ear normal. There is no impacted cerumen.      Left Ear: Tympanic membrane, ear canal and external ear normal. There is no impacted cerumen.     Nose: Nose normal. No congestion or rhinorrhea.     Mouth/Throat:     Mouth: Mucous membranes are moist.     Pharynx: Oropharynx is clear. No oropharyngeal exudate or posterior oropharyngeal erythema.  Eyes:     General: Scleral icterus present.        Right eye: No discharge.        Left eye: No discharge.     Extraocular Movements: Extraocular movements intact.     Conjunctiva/sclera: Conjunctivae normal.  Neck:     Vascular: No carotid bruit.  Cardiovascular:     Rate and Rhythm: Normal rate and regular rhythm.     Pulses: Normal pulses.     Heart sounds: Murmur heard.     No friction rub. No gallop.  Pulmonary:     Effort: Pulmonary effort is normal. No respiratory distress.     Breath sounds: Normal breath sounds. No stridor. No wheezing, rhonchi or rales.  Chest:     Chest wall: No tenderness.  Abdominal:     General: Bowel sounds are normal. There is no distension.     Palpations: Abdomen is soft. There is no mass.     Tenderness: There is no abdominal tenderness. There is no right CVA tenderness, left CVA tenderness, guarding or rebound.     Hernia: No hernia is present.  Musculoskeletal:        General: No swelling, tenderness, deformity or signs of injury.     Cervical back: Normal range of motion and neck supple. No rigidity or tenderness.     Right lower leg: No edema.     Left lower leg: No edema.  Lymphadenopathy:     Cervical: No cervical adenopathy.  Skin:    General: Skin is warm and dry.     Capillary Refill: Capillary refill takes less than 2 seconds.     Coloration: Skin is not jaundiced or pale.     Findings: No bruising, erythema, lesion or rash.  Neurological:     Mental Status: He is alert and oriented to person, place, and time.     Cranial Nerves: No cranial nerve deficit.     Sensory: No sensory deficit.     Motor: No weakness.     Coordination:  Coordination normal.     Gait: Gait normal.     Deep Tendon Reflexes: Reflexes normal.  Psychiatric:        Mood and Affect: Mood normal.        Behavior: Behavior normal.        Thought Content: Thought content normal.        Judgment: Judgment normal.     No results found for  any visits on 04/29/24.     Assessment & Plan:    Routine Health Maintenance and Physical Exam  Immunization History  Administered Date(s) Administered   HPV 9-valent 08/07/2018, 01/17/2019, 06/17/2019   Influenza,inj,Quad PF,6+ Mos 08/07/2018   Pneumococcal Polysaccharide-23 05/09/2018   Tdap 04/26/2017    Health Maintenance  Topic Date Due   COVID-19 Vaccine (1) Never done   Pneumococcal Vaccine 83-97 Years old (2 of 2 - PCV) 05/10/2019   INFLUENZA VACCINE  06/27/2024   DTaP/Tdap/Td (2 - Td or Tdap) 04/27/2027   HPV VACCINES  Completed   Hepatitis C Screening  Completed   HIV Screening  Completed   Meningococcal B Vaccine  Aged Out    Discussed health benefits of physical activity, and encouraged him to engage in regular exercise appropriate for his age and condition.  Problem List Items Addressed This Visit       Genitourinary   Infertility male   He and his wife have been trying to conceive  for the past 5 years,he would like referral to a urologist referral placed      Relevant Orders   Ambulatory referral to Urology     Other   TOBACCO ABUSE   Smokes about 0.25 pack/day  Asked about quitting: confirms that he currently smokes cigarettes Advise to quit smoking: Educated about QUITTING to reduce the risk of cancer, cardio and cerebrovascular disease. Assess willingness: Unwilling to quit at this time, but is working on cutting back. Assist with counseling and pharmacotherapy: Counseled for 5 minutes and literature provided. Arrange for follow up: follow up in 3 months and continue to offer help.       MARIJUANA ABUSE   Need to avoid smoking marijuana discussed      Hb-SS  disease without crisis (HCC)   Sickle cell disease -Continue Folic acid  1 mg daily to prevent aplastic bone marrow crises. We discussed the need for good hydration, monitoring of hydration status, avoidance of heat, cold, stress, and infection triggers.   The patient was reminded of the need to seek medical attention of any symptoms of bleeding, anemia, or infection.     Pulmonary evaluation - Patient denies severe recurrent wheezes, shortness of breath with exercise, or persistent cough.     Eye - High risk of proliferative retinopathy. Annual eye exam with retinal exam recommended to patient.  Phone number for ophthalmology office provided.   Immunization status - Patient will receive influenza vaccination on today  Acute and chronic painful episodes - .  We discussed that pt is to receive her Schedule II prescriptions only from us . Pt is also aware that the prescription history is available to us  online through the [facility] CSRS. We reminded the patient that all patients receiving Schedule II narcotics must be seen for follow within the 3 months in the office.  We reviewed the terms of our pain agreement, including the need to keep medicines in a safe locked location away from children or pets, and the need to report excess sedation or constipation, measures to avoid constipation, and policies related to early refills and stolen prescriptions. According to the [place] Chronic Pain Initiative program, we have reviewed details related to analgesia, adverse effects, aberrant behaviors. Reviewed [place] Substance Reporting system prior to prescribing opiate medication, no inconsistencies noted.    1. Hb-SS disease without crisis (HCC)  - Sickle Cell Panel - ToxAssure Flex 15, Ur - Oxycodone  HCl 10 MG TABS; Take 1 tablet (10 mg total) by mouth every  4 (four) hours as needed.  Dispense: 90 tablet; Refill: 0       Relevant Medications   Oxycodone  HCl 10 MG TABS   Other Relevant Orders   Sickle Cell Panel    ToxAssure Flex 15, Ur   Vitamin D  deficiency   Chronic prescription opiate use   Relevant Medications   Oxycodone  HCl 10 MG TABS   Urine test positive for microalbuminuria   He was referred to the nephrologist but did not follow-up due to cost of the visit Doing well on Farxiga  10 mg daily Continue current medication       Annual physical exam - Primary   Annual exam as documented.  Counseling done include healthy lifestyle involving committing to 150 minutes of exercise per week, heart healthy diet, and maintaining a healthy weight. The importance of adequate sleep also discussed.  Regular use of seat belt and home safety were also discussed . Changes in health habits are decided on by patient with goals and time frames set for achieving them. Immunization and cancer screening  needs are specifically addressed at this visit.   Encouraged to get pneumococcal vaccine at the pharmacy      Screening for lipid disorders   Relevant Orders   Lipid panel   No follow-ups on file.     Yassine Brunsman R Leone Putman, FNP

## 2024-04-29 NOTE — Progress Notes (Signed)
 Jerry [name]

## 2024-04-29 NOTE — Assessment & Plan Note (Signed)
 Smokes about 0.25 pack/day  Asked about quitting: confirms that he currently smokes cigarettes Advise to quit smoking: Educated about QUITTING to reduce the risk of cancer, cardio and cerebrovascular disease. Assess willingness: Unwilling to quit at this time, but is working on cutting back. Assist with counseling and pharmacotherapy: Counseled for 5 minutes and literature provided. Arrange for follow up: follow up in 3 months and continue to offer help.

## 2024-04-29 NOTE — Assessment & Plan Note (Addendum)
 He was referred to the nephrologist but did not follow-up due to cost of the visit Doing well on Farxiga  10 mg daily Continue current medication

## 2024-04-29 NOTE — Assessment & Plan Note (Signed)
 Sickle cell disease -Continue Folic acid  1 mg daily to prevent aplastic bone marrow crises. We discussed the need for good hydration, monitoring of hydration status, avoidance of heat, cold, stress, and infection triggers.   The patient was reminded of the need to seek medical attention of any symptoms of bleeding, anemia, or infection.     Pulmonary evaluation - Patient denies severe recurrent wheezes, shortness of breath with exercise, or persistent cough.     Eye - High risk of proliferative retinopathy. Annual eye exam with retinal exam recommended to patient.  Phone number for ophthalmology office provided.   Immunization status - Patient will receive influenza vaccination on today  Acute and chronic painful episodes - .  We discussed that pt is to receive her Schedule II prescriptions only from us . Pt is also aware that the prescription history is available to us  online through the [facility] CSRS. We reminded the patient that all patients receiving Schedule II narcotics must be seen for follow within the 3 months in the office.  We reviewed the terms of our pain agreement, including the need to keep medicines in a safe locked location away from children or pets, and the need to report excess sedation or constipation, measures to avoid constipation, and policies related to early refills and stolen prescriptions. According to the [place] Chronic Pain Initiative program, we have reviewed details related to analgesia, adverse effects, aberrant behaviors. Reviewed [place] Substance Reporting system prior to prescribing opiate medication, no inconsistencies noted.    1. Hb-SS disease without crisis (HCC)  - Sickle Cell Panel - ToxAssure Flex 15, Ur - Oxycodone  HCl 10 MG TABS; Take 1 tablet (10 mg total) by mouth every 4 (four) hours as needed.  Dispense: 90 tablet; Refill: 0

## 2024-04-29 NOTE — Assessment & Plan Note (Signed)
 Continue vitamin D  50,000 units once weekly Last vitamin D  Lab Results  Component Value Date   VD25OH 20.9 (L) 01/30/2024

## 2024-04-30 ENCOUNTER — Ambulatory Visit: Payer: Self-pay | Admitting: Nurse Practitioner

## 2024-04-30 LAB — CMP14+CBC/D/PLT+FER+RETIC+V...
ALT: 20 IU/L (ref 0–44)
AST: 49 IU/L — ABNORMAL HIGH (ref 0–40)
Albumin: 4.6 g/dL (ref 4.1–5.1)
Alkaline Phosphatase: 98 IU/L (ref 44–121)
BUN/Creatinine Ratio: 8 — ABNORMAL LOW (ref 9–20)
BUN: 9 mg/dL (ref 6–20)
Basophils Absolute: 0 10*3/uL (ref 0.0–0.2)
Basos: 0 %
Bilirubin Total: 3.3 mg/dL — ABNORMAL HIGH (ref 0.0–1.2)
CO2: 16 mmol/L — ABNORMAL LOW (ref 20–29)
Calcium: 9.1 mg/dL (ref 8.7–10.2)
Chloride: 103 mmol/L (ref 96–106)
Creatinine, Ser: 1.07 mg/dL (ref 0.76–1.27)
EOS (ABSOLUTE): 0.3 10*3/uL (ref 0.0–0.4)
Eos: 3 %
Ferritin: 168 ng/mL (ref 30–400)
Globulin, Total: 3.2 g/dL (ref 1.5–4.5)
Glucose: 81 mg/dL (ref 70–99)
Hematocrit: 24.1 % — ABNORMAL LOW (ref 37.5–51.0)
Hemoglobin: 8 g/dL — ABNORMAL LOW (ref 13.0–17.7)
Immature Grans (Abs): 0.1 10*3/uL (ref 0.0–0.1)
Immature Granulocytes: 1 %
Lymphocytes Absolute: 4.3 10*3/uL — ABNORMAL HIGH (ref 0.7–3.1)
Lymphs: 37 %
MCH: 35.1 pg — ABNORMAL HIGH (ref 26.6–33.0)
MCHC: 33.2 g/dL (ref 31.5–35.7)
MCV: 106 fL — ABNORMAL HIGH (ref 79–97)
Monocytes Absolute: 1.8 10*3/uL — ABNORMAL HIGH (ref 0.1–0.9)
Monocytes: 16 %
NRBC: 3 % — ABNORMAL HIGH (ref 0–0)
Neutrophils Absolute: 5 10*3/uL (ref 1.4–7.0)
Neutrophils: 43 %
Platelets: 336 10*3/uL (ref 150–450)
Potassium: 4.6 mmol/L (ref 3.5–5.2)
RBC: 2.28 x10E6/uL — CL (ref 4.14–5.80)
RDW: 25 % — ABNORMAL HIGH (ref 11.6–15.4)
Retic Ct Pct: 18.8 % — ABNORMAL HIGH (ref 0.6–2.6)
Sodium: 138 mmol/L (ref 134–144)
Total Protein: 7.8 g/dL (ref 6.0–8.5)
Vit D, 25-Hydroxy: 21.3 ng/mL — ABNORMAL LOW (ref 30.0–100.0)
WBC: 11.5 10*3/uL — ABNORMAL HIGH (ref 3.4–10.8)
eGFR: 93 mL/min/{1.73_m2} (ref >59)

## 2024-04-30 LAB — LIPID PANEL
Chol/HDL Ratio: 3.4 ratio (ref 0.0–5.0)
Cholesterol, Total: 122 mg/dL (ref 100–199)
HDL: 36 mg/dL — ABNORMAL LOW (ref >39)
LDL Chol Calc (NIH): 56 mg/dL (ref 0–99)
Triglycerides: 178 mg/dL — ABNORMAL HIGH (ref 0–149)
VLDL Cholesterol Cal: 30 mg/dL (ref 5–40)

## 2024-05-02 LAB — TOXASSURE FLEX 15, UR
6-ACETYLMORPHINE IA: NEGATIVE ng/mL
AMPHETAMINES IA: NEGATIVE ng/mL
BARBITURATES IA: NEGATIVE ng/mL
COCAINE METABOLITE IA: NEGATIVE ng/mL
Creatinine: 109 mg/dL (ref >=20)
ETHYL ALCOHOL Enzymatic: NEGATIVE g/dL
METHADONE IA: NEGATIVE ng/mL
METHADONE MTB IA: NEGATIVE ng/mL
OPIATE CLASS IA: NEGATIVE ng/mL
PHENCYCLIDINE IA: NEGATIVE ng/mL
TAPENTADOL, IA: NEGATIVE ng/mL
TRAMADOL IA: NEGATIVE ng/mL

## 2024-05-02 LAB — OXYCODONE CLASS, MS, UR RFX
Noroxycodone: 323 ng/mg{creat}
Noroxymorphone: 100 ng/mg{creat}
Oxycodone Class Confirmation: POSITIVE
Oxycodone: 161 ng/mg{creat}
Oxymorphone: 370 ng/mg{creat}

## 2024-05-02 LAB — CANNABINOIDS, MS, UR RFX
Cannabinoids Confirmation: POSITIVE
Carboxy-THC: 137 ng/mg{creat}

## 2024-05-06 ENCOUNTER — Other Ambulatory Visit: Payer: Self-pay

## 2024-05-08 ENCOUNTER — Other Ambulatory Visit (HOSPITAL_COMMUNITY): Payer: Self-pay

## 2024-05-16 ENCOUNTER — Other Ambulatory Visit (HOSPITAL_COMMUNITY): Payer: Self-pay

## 2024-05-16 ENCOUNTER — Other Ambulatory Visit: Payer: Self-pay | Admitting: Nurse Practitioner

## 2024-05-16 DIAGNOSIS — D571 Sickle-cell disease without crisis: Secondary | ICD-10-CM

## 2024-05-16 DIAGNOSIS — Z79891 Long term (current) use of opiate analgesic: Secondary | ICD-10-CM

## 2024-05-16 MED ORDER — OXYCODONE HCL 10 MG PO TABS
10.0000 mg | ORAL_TABLET | ORAL | 0 refills | Status: DC | PRN
  Filled 2024-05-16: qty 90, 15d supply, fill #0

## 2024-05-16 NOTE — Progress Notes (Signed)
 Reviewed PDMP substance reporting system prior to prescribing opiate medications. No inconsistencies noted.   1. Hb-SS disease without crisis (HCC)  - Oxycodone  HCl 10 MG TABS; Take 1 tablet (10 mg total) by mouth every 4 (four) hours as needed.  Dispense: 90 tablet; Refill: 0  2. Chronic prescription opiate use  - Oxycodone  HCl 10 MG TABS; Take 1 tablet (10 mg total) by mouth every 4 (four) hours as needed.  Dispense: 90 tablet; Refill: 0

## 2024-05-17 ENCOUNTER — Other Ambulatory Visit (HOSPITAL_COMMUNITY): Payer: Self-pay

## 2024-05-27 NOTE — Progress Notes (Unsigned)
    Chief Complaint: No chief complaint on file.   History of Present Illness:  Jerry [name] is a 38 y.o. male who is seen in consultation from Paseda, Folashade R, FNP for evaluation of ***.   Past Medical History:  Past Medical History:  Diagnosis Date   Chronic prescription opiate use 10/07/2019   GALLBLADDER DISEASE 02/07/2007   Annotation: Sludge without cholelithiasis  Qualifier: Diagnosis of   By: End MD, Lonni      Replacing diagnoses that were inactivated after the 02/26/23 regulatory import     MARIJUANA ABUSE 02/15/2007   Qualifier: Diagnosis of   By: End MD, Lonni      Replacing diagnoses that were inactivated after the 02/26/23 regulatory import     Persistent proteinuria 10/07/2019   Sickle cell anemia (HCC)     Past Surgical History:  No past surgical history on file.  Allergies:  No Known Allergies  Family History:  Family History  Problem Relation Age of Onset   Sickle cell trait Mother    Sickle cell trait Father     Social History:  Social History   Tobacco Use   Smoking status: Every Day   Smokeless tobacco: Never  Vaping Use   Vaping status: Some Days  Substance Use Topics   Alcohol use: No    Comment: rare   Drug use: Yes    Types: Marijuana    Comment: occasionally    Review of symptoms:  Constitutional:  Negative for unexplained weight loss, night sweats, fever, chills ENT:  Negative for nose bleeds, sinus pain, painful swallowing CV:  Negative for chest pain, shortness of breath, exercise intolerance, palpitations, loss of consciousness Resp:  Negative for cough, wheezing, shortness of breath GI:  Negative for nausea, vomiting, diarrhea, bloody stools GU:  Positives noted in HPI; otherwise negative for gross hematuria, dysuria, urinary incontinence Neuro:  Negative for seizures, poor balance, limb weakness, slurred speech Psych:  Negative for lack of energy, depression, anxiety Endocrine:  Negative for polydipsia,  polyuria, symptoms of hypoglycemia (dizziness, hunger, sweating) Hematologic:  Negative for anemia, purpura, petechia, prolonged or excessive bleeding, use of anticoagulants  Allergic:  Negative for difficulty breathing or choking as a result of exposure to anything; no shellfish allergy; no allergic response (rash/itch) to materials, foods  Physical exam: There were no vitals taken for this visit. GENERAL APPEARANCE:  Well appearing, well developed, well nourished, NAD HEENT: Atraumatic, Normocephalic. NECK: Normal appearance LUNGS: Normal inspiratory and expiratory excursion HEART: Regular Rate ABDOMEN: ***. GU: Phallus normal, no lesions. Scrotal skin normal. Testicles/epididymal structures normal. Meatus normal. Normal anal sphincter tone, prostate ***mL, symmetric, non nodular, non tender. EXTREMITIES: Moves all extremities well.  Without clubbing, cyanosis, or edema. NEUROLOGIC:  Alert and oriented x 3, normal gait, CN II-XII grossly intact.  MENTAL STATUS:  Appropriate. SKIN:  Warm, dry and intact.    Results: No results found for this or any previous visit (from the past 24 hours).  I have reviewed referring/prior physicians notes  I have reviewed urinalysis  I have reviewed PSA results  I have reviewed prior imaging  I have reviewed urine culture results  Assessment: ***   Plan: ***

## 2024-05-28 ENCOUNTER — Ambulatory Visit (INDEPENDENT_AMBULATORY_CARE_PROVIDER_SITE_OTHER): Payer: Self-pay | Admitting: Urology

## 2024-05-28 VITALS — BP 119/67 | HR 88 | Ht 71.0 in | Wt 178.0 lb

## 2024-05-28 DIAGNOSIS — N469 Male infertility, unspecified: Secondary | ICD-10-CM

## 2024-06-03 ENCOUNTER — Other Ambulatory Visit: Payer: Self-pay | Admitting: Nurse Practitioner

## 2024-06-03 DIAGNOSIS — D571 Sickle-cell disease without crisis: Secondary | ICD-10-CM

## 2024-06-03 DIAGNOSIS — Z79891 Long term (current) use of opiate analgesic: Secondary | ICD-10-CM

## 2024-06-04 ENCOUNTER — Other Ambulatory Visit: Payer: Self-pay

## 2024-06-06 ENCOUNTER — Other Ambulatory Visit: Payer: Self-pay | Admitting: Internal Medicine

## 2024-06-06 ENCOUNTER — Other Ambulatory Visit: Payer: Self-pay | Admitting: Nurse Practitioner

## 2024-06-06 ENCOUNTER — Other Ambulatory Visit: Payer: Self-pay

## 2024-06-06 ENCOUNTER — Other Ambulatory Visit (HOSPITAL_COMMUNITY): Payer: Self-pay

## 2024-06-06 ENCOUNTER — Telehealth: Payer: Self-pay

## 2024-06-06 DIAGNOSIS — Z79891 Long term (current) use of opiate analgesic: Secondary | ICD-10-CM

## 2024-06-06 DIAGNOSIS — D571 Sickle-cell disease without crisis: Secondary | ICD-10-CM

## 2024-06-06 MED ORDER — OXYCODONE HCL 10 MG PO TABS
10.0000 mg | ORAL_TABLET | ORAL | 0 refills | Status: DC | PRN
  Filled 2024-06-06: qty 70, 11d supply, fill #0
  Filled 2024-06-06: qty 20, 4d supply, fill #0

## 2024-06-06 NOTE — Telephone Encounter (Signed)
 Copied from CRM [id number]. Topic: Clinical - Medication Question >> Jun 06, 2024  3:33 PM Marissa P wrote: Reason for CRM: Patient called wanting to let the provider know he is till waiting for  his medication to get approved the  oxyCODONE  HCl (10 mg Oral Every 4 hours PRN), its chronic pain and its been a few days he hasn't received it he said. Please follow up and see if this can get refilled today. Yes I advised about rx refills 3 days etc., but he still insist on someone follow up and this gets approved right away please.  Was sent to Dr. Jegede. KH

## 2024-06-06 NOTE — Telephone Encounter (Signed)
 Done [facility]

## 2024-06-06 NOTE — Telephone Encounter (Signed)
 Please advise  thank you [facility]

## 2024-06-07 ENCOUNTER — Other Ambulatory Visit (HOSPITAL_COMMUNITY): Payer: Self-pay

## 2024-06-09 ENCOUNTER — Other Ambulatory Visit (HOSPITAL_COMMUNITY): Payer: Self-pay

## 2024-06-09 ENCOUNTER — Other Ambulatory Visit: Payer: Self-pay

## 2024-06-09 NOTE — Telephone Encounter (Signed)
This was taken care of. Jerry [name]

## 2024-06-10 ENCOUNTER — Other Ambulatory Visit: Payer: Self-pay

## 2024-06-16 ENCOUNTER — Other Ambulatory Visit: Payer: Self-pay

## 2024-07-03 ENCOUNTER — Telehealth: Payer: Self-pay

## 2024-07-03 ENCOUNTER — Other Ambulatory Visit: Payer: Self-pay

## 2024-07-03 NOTE — Telephone Encounter (Signed)
LMOM requesting a return call.

## 2024-07-03 NOTE — Telephone Encounter (Signed)
-----   Message from Garnette HERO Dahlstedt sent at 07/02/2024  2:20 PM EDT ----- Please call patient.  Good news, overall his sperm count and forward motion are normal.  I do not think he needs any management from this office.  It is okay to mail the results to him as well.  Tell him if he does not conceive in 6 months to come back to see me. ----- Message ----- From: McAdoo, Norma J Sent: 06/27/2024   7:50 AM EDT To: Garnette Shack, MD

## 2024-07-07 NOTE — Telephone Encounter (Signed)
 Spoke with pt in reference lab results. Pt voiced understanding. Pt requested results be mailed to him as well.

## 2024-07-07 NOTE — Telephone Encounter (Signed)
 Left message with lady of residence requesting for pt to return urology call. Marny stated she would have him call back when he returns.

## 2024-07-09 ENCOUNTER — Other Ambulatory Visit: Payer: Self-pay | Admitting: Internal Medicine

## 2024-07-09 DIAGNOSIS — Z79891 Long term (current) use of opiate analgesic: Secondary | ICD-10-CM

## 2024-07-09 DIAGNOSIS — D571 Sickle-cell disease without crisis: Secondary | ICD-10-CM

## 2024-07-14 ENCOUNTER — Other Ambulatory Visit: Payer: Self-pay

## 2024-07-14 ENCOUNTER — Other Ambulatory Visit (HOSPITAL_COMMUNITY): Payer: Self-pay

## 2024-07-14 DIAGNOSIS — D571 Sickle-cell disease without crisis: Secondary | ICD-10-CM

## 2024-07-14 DIAGNOSIS — Z79891 Long term (current) use of opiate analgesic: Secondary | ICD-10-CM

## 2024-07-14 MED ORDER — OXYCODONE HCL 10 MG PO TABS
10.0000 mg | ORAL_TABLET | ORAL | 0 refills | Status: DC | PRN
  Filled 2024-07-14: qty 90, 15d supply, fill #0

## 2024-07-14 NOTE — Telephone Encounter (Signed)
 Reviewed PDMP substance reporting system prior to prescribing opiate medications. No inconsistencies noted.   1. Hb-SS disease without crisis (HCC)  - Oxycodone  HCl 10 MG TABS; Take 1 tablet (10 mg total) by mouth every 4 (four) hours as needed.  Dispense: 90 tablet; Refill: 0  2. Chronic prescription opiate use  - Oxycodone  HCl 10 MG TABS; Take 1 tablet (10 mg total) by mouth every 4 (four) hours as needed.  Dispense: 90 tablet; Refill: 0

## 2024-07-30 ENCOUNTER — Ambulatory Visit (INDEPENDENT_AMBULATORY_CARE_PROVIDER_SITE_OTHER): Payer: Self-pay | Admitting: Nurse Practitioner

## 2024-07-30 ENCOUNTER — Encounter: Payer: Self-pay | Admitting: Nurse Practitioner

## 2024-07-30 ENCOUNTER — Other Ambulatory Visit (HOSPITAL_COMMUNITY): Payer: Self-pay

## 2024-07-30 ENCOUNTER — Other Ambulatory Visit: Payer: Self-pay | Admitting: Nurse Practitioner

## 2024-07-30 VITALS — BP 108/62 | HR 80 | Temp 97.9°F | Wt 172.8 lb

## 2024-07-30 DIAGNOSIS — E559 Vitamin D deficiency, unspecified: Secondary | ICD-10-CM

## 2024-07-30 DIAGNOSIS — F1210 Cannabis abuse, uncomplicated: Secondary | ICD-10-CM

## 2024-07-30 DIAGNOSIS — F419 Anxiety disorder, unspecified: Secondary | ICD-10-CM | POA: Diagnosis not present

## 2024-07-30 DIAGNOSIS — F339 Major depressive disorder, recurrent, unspecified: Secondary | ICD-10-CM | POA: Insufficient documentation

## 2024-07-30 DIAGNOSIS — D571 Sickle-cell disease without crisis: Secondary | ICD-10-CM

## 2024-07-30 DIAGNOSIS — Z79891 Long term (current) use of opiate analgesic: Secondary | ICD-10-CM

## 2024-07-30 DIAGNOSIS — F32A Depression, unspecified: Secondary | ICD-10-CM

## 2024-07-30 DIAGNOSIS — R801 Persistent proteinuria, unspecified: Secondary | ICD-10-CM

## 2024-07-30 DIAGNOSIS — F17200 Nicotine dependence, unspecified, uncomplicated: Secondary | ICD-10-CM

## 2024-07-30 MED ORDER — FOLIC ACID 1 MG PO TABS
1.0000 mg | ORAL_TABLET | Freq: Every day | ORAL | 1 refills | Status: DC
  Filled 2024-09-24: qty 90, 90d supply, fill #0

## 2024-07-30 MED ORDER — VITAMIN D (ERGOCALCIFEROL) 1.25 MG (50000 UNIT) PO CAPS
50000.0000 [IU] | ORAL_CAPSULE | ORAL | 3 refills | Status: DC
  Filled 2024-07-30: qty 12, 84d supply, fill #0
  Filled 2024-09-24: qty 12, 84d supply, fill #1

## 2024-07-30 MED ORDER — OXYCODONE HCL 10 MG PO TABS
10.0000 mg | ORAL_TABLET | ORAL | 0 refills | Status: DC | PRN
  Filled 2024-07-30: qty 90, 15d supply, fill #0

## 2024-07-30 MED ORDER — HYDROXYUREA 500 MG PO CAPS
1500.0000 mg | ORAL_CAPSULE | Freq: Every day | ORAL | 6 refills | Status: DC
  Filled 2024-07-30: qty 90, 30d supply, fill #0

## 2024-07-30 NOTE — Assessment & Plan Note (Signed)
 Last vitamin D  Lab Results  Component Value Date   VD25OH 21.3 (L) 04/29/2024   Managed with 50,000 units weekly. Reports inconsistency with intake. - Refill vitamin D  prescription. - Encourage adherence to vitamin D  regimen.

## 2024-07-30 NOTE — Patient Instructions (Signed)
 1. Hb-SS disease without crisis (HCC)  - hydroxyurea  (HYDREA ) 500 MG capsule; Take 3 capsules (1,500 mg total) by mouth daily. May take with food to minimize GI side effects.  Dispense: 90 capsule; Refill: 6 - Sickle Cell Panel - ToxAssure Flex 15, Ur - folic acid  (FOLVITE ) 1 MG tablet; Take 1 tablet (1 mg total) by mouth daily.  Dispense: 90 tablet; Refill: 1 - Oxycodone  HCl 10 MG TABS; Take 1 tablet (10 mg total) by mouth every 4 (four) hours as needed.  Dispense: 90 tablet; Refill: 0  2. Vitamin D  deficiency  - Vitamin D , Ergocalciferol , (DRISDOL ) 1.25 MG (50000 UNIT) CAPS capsule; Take 1 capsule (50,000 Units total) by mouth every 7 (seven) days.  Dispense: 12 capsule; Refill: 3  3. Chronic prescription opiate use  - Oxycodone  HCl 10 MG TABS; Take 1 tablet (10 mg total) by mouth every 4 (four) hours as needed.  Dispense: 90 tablet; Refill: 0    It is important that you exercise regularly at least 30 minutes 5 times a week as tolerated  Think about what you will eat, plan ahead. Choose  clean, green, fresh or frozen over canned, processed or packaged foods which are more sugary, salty and fatty. 70 to 75% of food eaten should be vegetables and fruit. Three meals at set times with snacks allowed between meals, but they must be fruit or vegetables. Aim to eat over a 12 hour period , example 7 am to 7 pm, and STOP after  your last meal of the day. Drink water,generally about 64 ounces per day, no other drink is as healthy. Fruit juice is best enjoyed in a healthy way, by EATING the fruit.  Thanks for choosing [facility] we consider it a privelige to serve you.

## 2024-07-30 NOTE — Assessment & Plan Note (Signed)
  Chronic pain in ribs and back, worsened by cold and rain. Pain level 3-4/10. Physical activity beneficial unless it increases pain or causes crisis. Occasional missed doses of hydroxyurea  and folic acid . - Refill hydroxyurea  and oxycodone  prescriptions. - Encourage adherence to folic acid  and other regimen. - Advise avoidance of triggers such as infection, extreme temperatures, and maintain hydration with at least 64 ounces of water daily. - Advise to keep pain medication in a secure location.

## 2024-07-30 NOTE — Assessment & Plan Note (Signed)
 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 and dealing with the same people responsible Not been able to trust too much people , or express what is going on to others  Never seen a therapist or taking medication Patient is interested in counseling, referral placed    07/30/2024   12:05 PM 04/29/2024   12:10 PM 01/30/2024    1:16 PM  Depression screen PHQ 2/9  Decreased Interest 2 0 0  Down, Depressed, Hopeless 2 0 0  PHQ - 2 Score 4 0 0  Altered sleeping 1 0 0  Tired, decreased energy 1 1 0  Change in appetite 1 0 0  Feeling bad or failure about yourself  0 0 0  Trouble concentrating 0 0 0  Moving slowly or fidgety/restless 0 0 0  Suicidal thoughts 0 0 0  PHQ-9 Score 7 1 0  Difficult doing work/chores  Not difficult at all Not difficult at all       07/30/2024   12:17 PM 04/29/2024   12:14 PM 01/30/2024    1:18 PM 10/30/2023    1:20 PM  GAD 7 : Generalized Anxiety Score  Nervous, Anxious, on Edge 0 0 1 0  Control/stop worrying 1 0 0 0  Worry too much - different things 1 0 0 0  Trouble relaxing 0 0 0 0  Restless 1 0 0 0  Easily annoyed or irritable 3 0 0 0  Afraid - awful might happen 0 0 0 0  Total GAD 7 Score 6 0 1 0  Anxiety Difficulty Not difficult at all Not difficult at all Not difficult at all Not difficult at all

## 2024-07-30 NOTE — Assessment & Plan Note (Signed)
  Uses cannabis for pain management. Aware of risks of over-sedation with oxycodone  and potential contamination. - Advise caution with cannabis use, especially in combination with oxycodone .

## 2024-07-30 NOTE — Assessment & Plan Note (Signed)
Continue Farxiga 10 mg daily

## 2024-07-30 NOTE — Assessment & Plan Note (Signed)
  Reduced to three cigarettes per day from half a pack. Aware of risks including lung cancer, COPD, and exacerbation of sickle cell crises. Motivated to quit for fertility. - Encourage continued reduction in smoking.
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
## 2024-07-31 ENCOUNTER — Other Ambulatory Visit (HOSPITAL_COMMUNITY): Payer: Self-pay

## 2024-07-31 ENCOUNTER — Telehealth: Payer: Self-pay

## 2024-07-31 LAB — CMP14+CBC/D/PLT+FER+RETIC+V...

## 2024-07-31 NOTE — Progress Notes (Signed)
 Complex Care Management Note  Care Guide Note 07/31/2024 Name: CORMAC WINT MRN: [id number] DOB: [birth year]  Meda MARLA Pouch is a 38 y.o. year old male who sees Paseda, Folashade R, FNP for primary care. I reached out to Meda MARLA Pouch by phone today to offer complex care management services.  Mr. Coolman was given information about Complex Care Management services today including:   The Complex Care Management services include support from the care team which includes your Nurse Care Manager, Clinical Social Worker, or Pharmacist.  The Complex Care Management team is here to help remove barriers to the health concerns and goals most important to you. Complex Care Management services are voluntary, and the patient may decline or stop services at any time by request to their care team member.   Complex Care Management Consent Status: Patient agreed to services and verbal consent obtained.   Follow up plan:  Telephone appointment with complex care management team member scheduled for:  08/28/2024  Encounter Outcome:  Patient Scheduled  Jeoffrey Buffalo , RMA       [facility], [facility] Guide  Direct Dial: [phone]  Website: [email or web address]

## 2024-08-01 ENCOUNTER — Ambulatory Visit: Payer: Self-pay | Admitting: Nurse Practitioner

## 2024-08-01 ENCOUNTER — Other Ambulatory Visit (HOSPITAL_COMMUNITY): Payer: Self-pay

## 2024-08-01 LAB — CMP14+CBC/D/PLT+FER+RETIC+V...
ALT: 24 IU/L (ref 0–44)
AST: 45 IU/L — ABNORMAL HIGH (ref 0–40)
Albumin: 4.9 g/dL (ref 4.1–5.1)
Alkaline Phosphatase: 90 IU/L (ref 44–121)
BUN/Creatinine Ratio: 9 (ref 9–20)
BUN: 11 mg/dL (ref 6–20)
Basos: 0
Bilirubin Total: 3.7 mg/dL — ABNORMAL HIGH (ref 0.0–1.2)
CO2: 22 mmol/L (ref 20–29)
Calcium: 9.6 mg/dL (ref 8.7–10.2)
Chloride: 102 mmol/L (ref 96–106)
Creatinine, Ser: 1.22 mg/dL (ref 0.76–1.27)
EOS (ABSOLUTE): 0 x10E3/uL (ref 0.0–0.2)
Eos: 2
Ferritin: 224 ng/mL (ref 30–400)
Globulin, Total: 3 g/dL (ref 1.5–4.5)
Glucose: 67 mg/dL — ABNORMAL LOW (ref 70–99)
Hematocrit: 24.4 — AB (ref 37.5–51.0)
Hemoglobin: 8.3 g/dL — AB (ref 13.0–17.7)
Immature Granulocytes: 0.1 x10E3/uL (ref 0.0–0.1)
Immature Granulocytes: 1
Lymphs: 21
MCH: 35.2 pg — AB (ref 26.6–33.0)
MCHC: 34 g/dL (ref 31.5–35.7)
MCV: 103 fL — AB (ref 79–97)
Monocytes Absolute: 0.2 x10E3/uL (ref 0.0–0.4)
Monocytes Absolute: 2.6 x10E3/uL — AB (ref 0.1–0.9)
Monocytes: 20
NRBC: 1 — AB (ref 0–0)
Neutrophils Absolute: 2.8 x10E3/uL (ref 0.7–3.1)
Neutrophils Absolute: 7.7 x10E3/uL — AB (ref 1.4–7.0)
Neutrophils: 56
Platelets: 337 x10E3/uL (ref 150–450)
Potassium: 4.6 mmol/L (ref 3.5–5.2)
RBC: 2.36 x10E6/uL — AB (ref 4.14–5.80)
RDW: 22.3 — AB (ref 11.6–15.4)
Retic Ct Pct: 18.6 — AB (ref 0.6–2.6)
Sodium: 139 mmol/L (ref 134–144)
Total Protein: 7.9 g/dL (ref 6.0–8.5)
Vit D, 25-Hydroxy: 16 ng/mL — ABNORMAL LOW (ref 30.0–100.0)
WBC: 13.4 x10E3/uL — AB (ref 3.4–10.8)
eGFR: 79 mL/min/1.73 (ref >59)

## 2024-08-03 LAB — CANNABINOIDS, MS, UR RFX
Cannabinoids Confirmation: POSITIVE
Carboxy-THC: 211 ng/mg{creat}

## 2024-08-03 LAB — OXYCODONE CLASS, MS, UR RFX
Noroxycodone: 1364 ng/mg{creat}
Noroxymorphone: 342 ng/mg{creat}
Oxycodone Class Confirmation: POSITIVE
Oxycodone: 1280 ng/mg{creat}
Oxymorphone: 1503 ng/mg{creat}

## 2024-08-03 LAB — TOXASSURE FLEX 15, UR

## 2024-08-03 LAB — OPIATE CLASS, MS, UR RFX
Codeine: NOT DETECTED ng/mg{creat}
Dihydrocodeine: NOT DETECTED ng/mg{creat}
Hydrocodone: NOT DETECTED ng/mg{creat}
Hydromorphone: NOT DETECTED ng/mg{creat}
Morphine: NOT DETECTED ng/mg{creat}
Norcodeine: NOT DETECTED ng/mg{creat}
Norhydrocodone: NOT DETECTED ng/mg{creat}
Normorphine: NOT DETECTED ng/mg{creat}
Opiate Class Confirmation: NEGATIVE

## 2024-08-04 ENCOUNTER — Other Ambulatory Visit: Payer: Self-pay

## 2024-08-22 ENCOUNTER — Other Ambulatory Visit (HOSPITAL_COMMUNITY): Payer: Self-pay

## 2024-08-22 ENCOUNTER — Other Ambulatory Visit: Payer: Self-pay | Admitting: Nurse Practitioner

## 2024-08-22 DIAGNOSIS — D571 Sickle-cell disease without crisis: Secondary | ICD-10-CM

## 2024-08-22 DIAGNOSIS — Z79891 Long term (current) use of opiate analgesic: Secondary | ICD-10-CM

## 2024-08-22 MED ORDER — OXYCODONE HCL 10 MG PO TABS
10.0000 mg | ORAL_TABLET | ORAL | 0 refills | Status: DC | PRN
  Filled 2024-08-22: qty 90, 15d supply, fill #0

## 2024-08-22 NOTE — Telephone Encounter (Signed)
 Reviewed PDMP substance reporting system prior to prescribing opiate medications. No inconsistencies noted.   1. Hb-SS disease without crisis (HCC)  - Oxycodone  HCl 10 MG TABS; Take 1 tablet (10 mg total) by mouth every 4 (four) hours as needed.  Dispense: 90 tablet; Refill: 0  2. Chronic prescription opiate use  - Oxycodone  HCl 10 MG TABS; Take 1 tablet (10 mg total) by mouth every 4 (four) hours as needed.  Dispense: 90 tablet; Refill: 0

## 2024-08-28 ENCOUNTER — Other Ambulatory Visit: Payer: Self-pay | Admitting: Licensed Clinical Social Worker
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
## 2024-08-28 NOTE — Patient Instructions (Signed)
 Visit Information  Jerry [name] was given information about Medicaid Managed Care team care coordination services as a part of their [facility] Medicaid benefit.   If you would like to schedule transportation through your [facility], please call the following number at least 2 days in advance of your appointment: [phone]   Rides for urgent appointments can also be made after hours by calling Member Services.  Call the Behavioral Health Crisis Line at [phone], at any time, 24 hours a day, 7 days a week. If you are in danger or need immediate medical attention call 911.  Please see education materials related to topics discussed provided by MyChart link. and by e-mail link.  Patient verbalizes understanding of instructions and care plan provided today and agrees to view in MyChart. Active MyChart status and patient understanding of how to access instructions and care plan via MyChart confirmed with patient.     Licensed Clinical Social Worker will call you to f/up on 10/31 at 3:30 PM  Rolin Kerns, LCSW [facility]  [facility], [facility] Clinical Social Worker Direct Dial: [phone]  Fax: [phone] Website: [email or web address] 4:09 PM   Following is a copy of your plan of care:  There are no care plans that you recently modified to display for this patient.

## 2024-08-29 ENCOUNTER — Other Ambulatory Visit: Payer: Self-pay | Admitting: Nurse Practitioner

## 2024-08-29 DIAGNOSIS — F32A Depression, unspecified: Secondary | ICD-10-CM

## 2024-09-04 ENCOUNTER — Other Ambulatory Visit: Payer: Self-pay | Admitting: Nurse Practitioner

## 2024-09-04 DIAGNOSIS — D571 Sickle-cell disease without crisis: Secondary | ICD-10-CM

## 2024-09-04 DIAGNOSIS — Z79891 Long term (current) use of opiate analgesic: Secondary | ICD-10-CM

## 2024-09-05 ENCOUNTER — Other Ambulatory Visit (HOSPITAL_COMMUNITY): Payer: Self-pay

## 2024-09-05 MED ORDER — OXYCODONE HCL 10 MG PO TABS
10.0000 mg | ORAL_TABLET | ORAL | 0 refills | Status: DC | PRN
  Filled 2024-09-06: qty 90, 15d supply, fill #0

## 2024-09-05 NOTE — Telephone Encounter (Signed)
 Reviewed PDMP substance reporting system prior to prescribing opiate medications. No inconsistencies noted.   1. Hb-SS disease without crisis (HCC)  - Oxycodone  HCl 10 MG TABS; Take 1 tablet (10 mg total) by mouth every 4 (four) hours as needed.  Dispense: 90 tablet; Refill: 0  2. Chronic prescription opiate use  - Oxycodone  HCl 10 MG TABS; Take 1 tablet (10 mg total) by mouth every 4 (four) hours as needed.  Dispense: 90 tablet; Refill: 0

## 2024-09-06 ENCOUNTER — Other Ambulatory Visit (HOSPITAL_COMMUNITY): Payer: Self-pay

## 2024-09-24 ENCOUNTER — Other Ambulatory Visit: Payer: Self-pay | Admitting: Nurse Practitioner

## 2024-09-24 ENCOUNTER — Other Ambulatory Visit (HOSPITAL_COMMUNITY): Payer: Self-pay

## 2024-09-24 ENCOUNTER — Other Ambulatory Visit: Payer: Self-pay

## 2024-09-24 DIAGNOSIS — D571 Sickle-cell disease without crisis: Secondary | ICD-10-CM

## 2024-09-24 DIAGNOSIS — Z79891 Long term (current) use of opiate analgesic: Secondary | ICD-10-CM

## 2024-09-24 MED ORDER — OXYCODONE HCL 10 MG PO TABS
10.0000 mg | ORAL_TABLET | ORAL | 0 refills | Status: DC | PRN
  Filled 2024-09-24: qty 90, 15d supply, fill #0

## 2024-09-24 NOTE — Telephone Encounter (Signed)
 Please advise [facility]

## 2024-09-26 ENCOUNTER — Telehealth: Payer: Self-pay | Admitting: Licensed Clinical Social Worker

## 2024-10-08 ENCOUNTER — Other Ambulatory Visit: Payer: Self-pay | Admitting: Nurse Practitioner

## 2024-10-08 ENCOUNTER — Other Ambulatory Visit (HOSPITAL_COMMUNITY): Payer: Self-pay

## 2024-10-08 DIAGNOSIS — Z79891 Long term (current) use of opiate analgesic: Secondary | ICD-10-CM

## 2024-10-08 DIAGNOSIS — D571 Sickle-cell disease without crisis: Secondary | ICD-10-CM

## 2024-10-08 MED ORDER — OXYCODONE HCL 10 MG PO TABS
10.0000 mg | ORAL_TABLET | ORAL | 0 refills | Status: DC | PRN
  Filled 2024-10-09: qty 90, 15d supply, fill #0

## 2024-10-08 NOTE — Telephone Encounter (Signed)
 Please advise [facility]

## 2024-10-09 ENCOUNTER — Other Ambulatory Visit (HOSPITAL_COMMUNITY): Payer: Self-pay

## 2024-10-13 ENCOUNTER — Encounter: Payer: Self-pay | Admitting: Licensed Clinical Social Worker

## 2024-10-13 ENCOUNTER — Telehealth: Payer: Self-pay | Admitting: Licensed Clinical Social Worker

## 2024-10-13 NOTE — Patient Instructions (Signed)
 Jerry [name] - I am sorry I was unable to reach you today for our scheduled appointment. I work with Paseda, Folashade R, FNP and am calling to support your healthcare needs. Please contact me at [phone] at your earliest convenience. I look forward to speaking with you soon.   Thank you,  Rolin Kerns, LCSW [place]  [facility], [facility] Clinical Social Worker Direct Dial: [phone]  Fax: [phone] Website: [email or web address] 4:22 PM

## 2024-10-14 ENCOUNTER — Encounter (HOSPITAL_COMMUNITY): Payer: Self-pay

## 2024-10-14 ENCOUNTER — Encounter (HOSPITAL_COMMUNITY): Payer: Self-pay | Admitting: Licensed Clinical Social Worker

## 2024-10-14 ENCOUNTER — Ambulatory Visit (INDEPENDENT_AMBULATORY_CARE_PROVIDER_SITE_OTHER): Payer: Self-pay | Admitting: Licensed Clinical Social Worker

## 2024-10-14 DIAGNOSIS — F1290 Cannabis use, unspecified, uncomplicated: Secondary | ICD-10-CM

## 2024-10-14 DIAGNOSIS — F322 Major depressive disorder, single episode, severe without psychotic features: Secondary | ICD-10-CM | POA: Diagnosis not present

## 2024-10-14 NOTE — Progress Notes (Signed)
 THERAPIST PROGRESS NOTE  Session Time: 11:25 a.m. to 12:17 p.m.   Type of Therapy: Individual   Therapist Response/Interventions: CBT/the therapist talks to Jerry [name] about his goals for treatment and assertiveness and emailed him a link to an assertiveness workbook to learn more about how to utilize the skill.  Treatment Goals addressed:  Active     OP Depression     LTG: Jerry [name] will report stability in his mood as evidenced by his PHQ-9 and GAD-7 scores both being a 4 or less in addition to confidence in his ability to resolve interpersonal conflict in an assertive vs an aggressive manner per self-report. (Initial)     Start:  10/14/24    Expected End:  04/13/25            Summary: Jerry [name] presents about 30 minutes late for his initial appointment having been referred by his PCP. When the therapist gets him from the waiting room having given him 25 minutes to do the new patient paperwork, he has still not completed the majority of it as he [name] that he likes to be very exact so as to answer questions accurately. The therapist spends time answering his questions about the limits of confidentiality.  He says that he asked his primary care doctor about seeing a therapist though he says that he has never seen a therapist or a psychiatrist before in the past.  He does say that he attended some programs during the 10 years that he spent in prison saying that he completed an anger management program and that he attended some 12-step programs as well.  Per a review of the [place]  public criminal offender lookup, Jerry [name] went to prison for robbery with a dangerous weapon and larceny.  He also had prior convictions for possession of a scheduled 6 substance and misdemeanor B&E.Jerry  Jerry [name] says that he has had an issue with people telling him what he [name] thinking [name] wrong saying that it has been worse over the past 6 years.  He experiences this problem mainly with family members including  his wife, his mother, and sometimes his father and sister.  On the other hand, he says that his younger brother and middle brother tend to validate his thoughts and feelings and let him know that he [name] not tripping.  He concludes that he has issues involving interpersonal conflict involve his family about 65% of the time and outside people the other 35% of the time.  He has been married to his wife for the past 3 years having no prior marriages.  He has 3 children ages 60, 78, and 60.Jerry [name] went to prison at the age of 66 and came home at the age of 9 saying that he had his first outside job at age 25.  Someone told him that he did not have the time to make the same mistakes that other people made in their 87s.Jerry [name] that these new jobs challenged him to think of things and new ways and treat people differently than he did in prison or on the streets.  He admits that he has to smile and cannot show his anger.  The therapist makes the observation that Jerry [name] apparently struggling with how to handle interpersonal conflict in an assertive rather than an aggressive manner.  He has met a few times with Miss Rolin Kerns, LCSW through complex care management and she questioned if he might not wish to try to be placed on some psychotropic medication.  His  PHQ-9 score today [name] a 20 and his GAD-7 [name] a 21 both of which would suggest that he [name] a potential candidate for psychotropic medication. Jerry [name] says that he does not wish to be placed on medication at this time but wants to try therapy.  He does self medicate with marijuana and the therapist asks that he abstain from using marijuana prior to coming to his therapy appointments such that he [name] not under the influence during sessions.  He [name] agreeable to this suggestion.  Presently, he works at [employer] and gets off work around 3:30 p.m. on weekdays.  Progress Towards Goals: Initial  Suicidal/Homicidal: No SI or HI  Plan: Return  again in 2 weeks.  Diagnosis: Major depression, single episode, severe (rule out PTSD and rule out unspecified bipolar disorder) and Cannabis Use Disorder  Collaboration of Care: Other N/A  Patient/Guardian was advised Release of Information must be obtained prior to any record release in order to collaborate their care with an outside provider. Patient/Guardian was advised if they have not already done so to contact the registration department to sign all necessary forms in order for us  to release information regarding their care.   Consent: Patient/Guardian gives verbal consent for treatment and assignment of benefits for services provided during this visit. Patient/Guardian expressed understanding and agreed to proceed.   Zell Maier, MA, LCSW, [facility], LCAS 10/14/2024

## 2024-10-27 ENCOUNTER — Other Ambulatory Visit: Payer: Self-pay | Admitting: Nurse Practitioner

## 2024-10-27 ENCOUNTER — Other Ambulatory Visit (HOSPITAL_COMMUNITY): Payer: Self-pay

## 2024-10-27 ENCOUNTER — Other Ambulatory Visit: Payer: Self-pay

## 2024-10-27 DIAGNOSIS — Z79891 Long term (current) use of opiate analgesic: Secondary | ICD-10-CM

## 2024-10-27 DIAGNOSIS — D571 Sickle-cell disease without crisis: Secondary | ICD-10-CM

## 2024-10-27 MED ORDER — OXYCODONE HCL 10 MG PO TABS
10.0000 mg | ORAL_TABLET | ORAL | 0 refills | Status: AC | PRN
  Filled 2024-10-27: qty 90, 15d supply, fill #0

## 2024-10-27 NOTE — Telephone Encounter (Signed)
 Please advise [facility]

## 2024-10-28 ENCOUNTER — Ambulatory Visit (HOSPITAL_COMMUNITY): Admitting: Licensed Clinical Social Worker

## 2024-10-28 ENCOUNTER — Telehealth (HOSPITAL_COMMUNITY): Payer: Self-pay | Admitting: Licensed Clinical Social Worker

## 2024-10-28 NOTE — Telephone Encounter (Signed)
 Jerry [name] calls saying that he could not make his appointment today as he came down with a cold last night but came to the office to cancel not wanting to be charged a no show fee but not wanting to make others sick.   The therapist returns his call confirming that he received his message and that he is the number to reach this therapist's voicemail 24/7.   Zell Maier, MA, LCSW, [facility], LCAS 10/28/2024

## 2024-10-28 NOTE — Telephone Encounter (Deleted)
 Christain calls saying that he could not make his appointment today as he came down with a cold last night but came to the office to cancel not wanting to be charged a no show fee but not wanting to make others sick.   The therapist returns his call confirming that he received his message and that he is the number to reach this therapist's voicemail 24/7.   Zell Maier, MA, LCSW, [facility], LCAS 10/28/2024

## 2024-10-28 NOTE — Telephone Encounter (Deleted)
 The therapist returns Jerry [name]'s call to confirm he received his message and that he has his direct contact number for future reference.  Jerry [name] says that he came down with a cold last night but came into the office and cancelled as he did not want to receive a no show fee but did not want to make other sick.

## 2024-10-29 ENCOUNTER — Ambulatory Visit: Payer: Self-pay | Admitting: Nurse Practitioner

## 2024-10-29 ENCOUNTER — Encounter: Payer: Self-pay | Admitting: Nurse Practitioner

## 2024-10-29 ENCOUNTER — Other Ambulatory Visit (HOSPITAL_COMMUNITY): Payer: Self-pay

## 2024-10-29 VITALS — BP 122/51 | HR 78 | Wt 171.0 lb

## 2024-10-29 DIAGNOSIS — R801 Persistent proteinuria, unspecified: Secondary | ICD-10-CM

## 2024-10-29 DIAGNOSIS — F17200 Nicotine dependence, unspecified, uncomplicated: Secondary | ICD-10-CM

## 2024-10-29 DIAGNOSIS — E559 Vitamin D deficiency, unspecified: Secondary | ICD-10-CM

## 2024-10-29 DIAGNOSIS — Z79891 Long term (current) use of opiate analgesic: Secondary | ICD-10-CM

## 2024-10-29 DIAGNOSIS — F419 Anxiety disorder, unspecified: Secondary | ICD-10-CM | POA: Insufficient documentation

## 2024-10-29 DIAGNOSIS — F1210 Cannabis abuse, uncomplicated: Secondary | ICD-10-CM

## 2024-10-29 DIAGNOSIS — R809 Proteinuria, unspecified: Secondary | ICD-10-CM

## 2024-10-29 DIAGNOSIS — F332 Major depressive disorder, recurrent severe without psychotic features: Secondary | ICD-10-CM

## 2024-10-29 DIAGNOSIS — D571 Sickle-cell disease without crisis: Secondary | ICD-10-CM

## 2024-10-29 MED ORDER — DAPAGLIFLOZIN PROPANEDIOL 10 MG PO TABS
10.0000 mg | ORAL_TABLET | Freq: Every day | ORAL | 2 refills | Status: AC
  Filled 2024-10-29: qty 90, 90d supply, fill #0

## 2024-10-29 MED ORDER — FOLIC ACID 1 MG PO TABS
1.0000 mg | ORAL_TABLET | Freq: Every day | ORAL | 2 refills | Status: AC
  Filled 2024-10-29: qty 90, 90d supply, fill #0

## 2024-10-29 MED ORDER — HYDROXYUREA 500 MG PO CAPS
1500.0000 mg | ORAL_CAPSULE | Freq: Every day | ORAL | 3 refills | Status: AC
  Filled 2024-10-29: qty 102, 34d supply, fill #0

## 2024-10-29 NOTE — Progress Notes (Signed)
 Established Patient Office Visit  Subjective:  Patient ID: Jerry [name], male    DOB: Sep 27, 1986  Age: 38 y.o. MRN: [id number]  CC:  Chief Complaint  Patient presents with   Sickle Cell Anemia    HPI    Discussed the use of AI scribe software for clinical note transcription with the patient, who gave verbal consent to proceed.  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 who presents for follow-up regarding medication management and pain control.  He has not experienced any sickle cell crises requiring hospitalization since his last visit in August. A few days ago, he had a scare, which he attributes to recovering from a cold, but it did not escalate into a crisis. He is currently not taking hydroxyurea , which he last used around mid-year, as he was waiting to see if insurance would cover it. He now has full Medicaid.  He takes folic acid  1 mg daily and vitamin D  50,000 units weekly. He has stopped taking ibuprofen  due to its size and potential kidney issues, opting for Tylenol  650 mg every six hours as needed for pain. He uses oxycodone  10 mg every four hours for moderate pain, noting that sometimes his pain feels worse after the medication wears off, which he attributes to a possible psychological effect.  He experiences tiredness with Farxiga , which he takes at night with food. He plans to switch to taking it during the day to avoid grogginess upon waking. He takes Farxiga  10 mg daily.  He recently had a cold, which was easy to manage, but experienced significant back pain, which he attributes to the cold weather. He manages pain with oxycodone  and supplements with bananas, broth, and coconut water for hydration and pain relief.  He has been cutting  back on smoking marijuana, now using it twice a day, and has reduced cigarette smoking to about four a day. He is attending therapy for anxiety and depression, which he describes as being at a high level. He is learning tools to manage these conditions and is considering medication options that would not overly sedate him.   Assessment & Plan             Past Medical History:  Diagnosis Date   Chronic prescription opiate use 10/07/2019   GALLBLADDER DISEASE 02/07/2007   Annotation: Sludge without cholelithiasis  Qualifier: Diagnosis of   By: End MD, Lonni      Replacing diagnoses that were inactivated after the 02/26/23 regulatory import     MARIJUANA ABUSE 02/15/2007   Qualifier: Diagnosis of   By: End MD, Lonni      Replacing diagnoses that were inactivated after the 02/26/23 regulatory import     Persistent proteinuria 10/07/2019   Sickle cell anemia (HCC)     History reviewed. No pertinent surgical history.  Family History  Problem Relation Age of Onset   Sickle cell trait Mother    Sickle cell trait Father     Social History   Socioeconomic History   Marital status: Married    Spouse name: Not on file   Number of children: Not on file   Years of education: Not on file   Highest education level: Not on file  Occupational History   Not on file  Tobacco Use   Smoking status: Every Day  Smokeless tobacco: Never  Vaping Use   Vaping status: Some Days  Substance and Sexual Activity   Alcohol use: No    Comment: rare   Drug use: Yes    Types: Marijuana    Comment: occasionally   Sexual activity: Yes    Birth control/protection: Condom  Other Topics Concern   Not on file  Social History Narrative   Lives with his wife    Social Drivers of Health   Financial Resource Strain: Medium Risk (02/13/2024)   Overall Financial Resource Strain (CARDIA)    Difficulty of Paying Living Expenses: Somewhat hard  Food Insecurity: No Food Insecurity (08/28/2024)    Hunger Vital Sign    Worried About Running Out of Food in the Last Year: Never true    Ran Out of Food in the Last Year: Never true  Transportation Needs: No Transportation Needs (08/28/2024)   PRAPARE - Administrator, Civil Service (Medical): No    Lack of Transportation (Non-Medical): No  Physical Activity: Not on file  Stress: Not on file  Social Connections: Not on file  Intimate Partner Violence: Not At Risk (08/28/2024)   Humiliation, Afraid, Rape, and Kick questionnaire    Fear of Current or Ex-Partner: No    Emotionally Abused: No    Physically Abused: No    Sexually Abused: No    Outpatient Medications Prior to Visit  Medication Sig Dispense Refill   Oxycodone  HCl 10 MG TABS Take 1 tablet (10 mg total) by mouth every 4 (four) hours as needed. 90 tablet 0   Vitamin D , Ergocalciferol , (DRISDOL ) 1.25 MG (50000 UNIT) CAPS capsule Take 1 capsule (50,000 Units total) by mouth every 7 (seven) days. 12 capsule 3   dapagliflozin  propanediol (FARXIGA ) 10 MG TABS tablet Take 1 tablet (10 mg total) by mouth daily before breakfast. 30 tablet 2   folic acid  (FOLVITE ) 1 MG tablet Take 1 tablet (1 mg total) by mouth daily. 90 tablet 1   hydroxyurea  (HYDREA ) 500 MG capsule Take 3 capsules (1,500 mg total) by mouth daily. May take with food to minimize GI side effects. (Patient not taking: Reported on 10/29/2024) 90 capsule 6   ibuprofen  (ADVIL ) 800 MG tablet Take 1 tablet by mouth three times daily as needed. (Patient not taking: Reported on 10/29/2024) 30 tablet 6   No facility-administered medications prior to visit.    No Known Allergies  ROS Review of Systems  Constitutional:  Negative for appetite change, chills, fatigue and fever.  HENT:  Negative for congestion, postnasal drip, rhinorrhea and sneezing.   Respiratory:  Negative for cough, shortness of breath and wheezing.   Cardiovascular:  Negative for chest pain, palpitations and leg swelling.  Gastrointestinal:   Negative for abdominal pain, constipation, nausea and vomiting.  Genitourinary:  Negative for difficulty urinating, dysuria, flank pain and frequency.  Musculoskeletal:  Positive for back pain. Negative for joint swelling and myalgias.  Skin:  Negative for color change, pallor, rash and wound.  Neurological:  Negative for dizziness, facial asymmetry, weakness, numbness and headaches.  Psychiatric/Behavioral:  Negative for behavioral problems, confusion, self-injury and suicidal ideas.       Objective:    Physical Exam Vitals and nursing note reviewed.  Constitutional:      General: He is not in acute distress.    Appearance: Normal appearance. He is not ill-appearing, toxic-appearing or diaphoretic.  Eyes:     General: No scleral icterus.       Right eye: No  discharge.        Left eye: No discharge.     Extraocular Movements: Extraocular movements intact.     Conjunctiva/sclera: Conjunctivae normal.  Cardiovascular:     Rate and Rhythm: Normal rate and regular rhythm.     Pulses: Normal pulses.     Heart sounds: Normal heart sounds. No murmur heard.    No friction rub. No gallop.  Pulmonary:     Effort: Pulmonary effort is normal. No respiratory distress.     Breath sounds: Normal breath sounds. No stridor. No wheezing, rhonchi or rales.  Chest:     Chest wall: No tenderness.  Abdominal:     General: There is no distension.     Palpations: Abdomen is soft.     Tenderness: There is no abdominal tenderness. There is no right CVA tenderness, left CVA tenderness or guarding.  Musculoskeletal:        General: No swelling, tenderness, deformity or signs of injury.     Right lower leg: No edema.     Left lower leg: No edema.  Skin:    General: Skin is warm and dry.     Capillary Refill: Capillary refill takes less than 2 seconds.     Coloration: Skin is not jaundiced or pale.     Findings: No bruising, erythema or lesion.  Neurological:     Mental Status: He is alert and  oriented to person, place, and time.     Motor: No weakness.     Coordination: Coordination normal.     Gait: Gait normal.  Psychiatric:        Mood and Affect: Mood normal.        Behavior: Behavior normal.        Thought Content: Thought content normal.        Judgment: Judgment normal.     BP (!) 122/51   Pulse 78   Wt 171 lb (77.6 kg)   SpO2 95%   BMI 23.85 kg/m  Wt Readings from Last 3 Encounters:  10/29/24 171 lb (77.6 kg)  07/30/24 172 lb 12.8 oz (78.4 kg)  05/28/24 178 lb (80.7 kg)    Lab Results  Component Value Date   TSH 0.809 07/04/2019   Lab Results  Component Value Date   WBC 13.4 (H) 07/30/2024   HGB 8.3 (L) 07/30/2024   HCT 24.4 (L) 07/30/2024   MCV 103 (H) 07/30/2024   PLT 337 07/30/2024   Lab Results  Component Value Date   NA 139 07/30/2024   K 4.6 07/30/2024   CO2 22 07/30/2024   GLUCOSE 67 (L) 07/30/2024   BUN 11 07/30/2024   CREATININE 1.22 07/30/2024   BILITOT 3.7 (H) 07/30/2024   ALKPHOS 90 07/30/2024   AST 45 (H) 07/30/2024   ALT 24 07/30/2024   PROT 7.9 07/30/2024   ALBUMIN 4.9 07/30/2024   CALCIUM 9.6 07/30/2024   ANIONGAP 9 01/08/2024   EGFR 79 07/30/2024   Lab Results  Component Value Date   CHOL 122 04/29/2024   Lab Results  Component Value Date   HDL 36 (L) 04/29/2024   Lab Results  Component Value Date   LDLCALC 56 04/29/2024   Lab Results  Component Value Date   TRIG 178 (H) 04/29/2024   Lab Results  Component Value Date   CHOLHDL 3.4 04/29/2024   No results found for: HGBA1C    Assessment & Plan:   Problem List Items Addressed This Visit  Other   TOBACCO ABUSE   Smokes about 4 cigarettes /day  Asked about quitting: confirms that he currently smokes cigarettes Advise to quit smoking: Educated about QUITTING to reduce the risk of cancer, cardio and cerebrovascular disease. Assess willingness: Unwilling to quit at this time, but is working on cutting back. Assist with counseling and  pharmacotherapy: Counseled for 3 minutes and literature provided. Arrange for follow up: follow up in 3 months and continue to offer help.          MARIJUANA ABUSE    Reduced to two times per day. Aware of risks with oxycodone . 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 We discussed the need to be adherent with taking folic acid , Hydrea  and other home medications. Patient was reminded of the need to seek medical attention immediately if any symptom of bleeding, anemia, or infection occurs.   Continue oxycodone  10mg  every 4 hours as needed for moderate pain, advised tylenol  650mg  every 6 hours as needed  Encouraged to keep oxycodone  a a safe and secured cabinet,and  report excessive sedation. Side effects  of medication including constipation reviewed, can take over-the-counter laxative as needed if constipation becomes a problem need to avoid use of illicit drugs discussed.        Relevant Medications   hydroxyurea  (HYDREA ) 500 MG capsule   folic acid  (FOLVITE ) 1 MG tablet   Other Relevant Orders   Sickle Cell Panel   ToxAssure Flex 15, Ur   Vitamin D  deficiency   Last vitamin D  Lab Results  Component Value Date   VD25OH 16.0 (L) 07/30/2024   Continue vitamin D  50,000 units once weekly       Relevant Orders   Sickle Cell Panel   ToxAssure Flex 15, Ur   Chronic prescription opiate use   Continue oxycodone  10 mg every 4 hours as needed for moderate pain , advised Tylenol  650 mg every 6 hours as needed for mild pain      Persistent proteinuria   Relevant Medications   dapagliflozin  propanediol (FARXIGA ) 10 MG TABS tablet   Urine test positive for microalbuminuria   - Continue Farxiga  10 mg daily. - Advised taking Farxiga  in the morning with food.      Depression, major,  recurrent      10/14/2024   11:50 AM 07/30/2024   12:05 PM 04/29/2024   12:10 PM  Depression screen PHQ 2/9  Decreased Interest 3 2 0  Down, Depressed, Hopeless 2 2 0  PHQ - 2 Score 5 4 0  Altered sleeping 1 1 0  Tired, decreased energy 2 1 1   Change in appetite 3 1 0  Feeling bad or failure about yourself  3 0 0  Trouble concentrating 3 0 0  Moving slowly or fidgety/restless 3 0 0  Suicidal thoughts 0 0 0  PHQ-9 Score 20 7  1    Difficult doing work/chores   Not difficult at all     Data saved with a previous flowsheet row definition   Managed with therapy. Prefers non-addictive medications. Therapy provides management tools. - Continue therapy. - Provided printouts on lexapro for review. He denies SI, HI       Anxiety   Other Visit Diagnoses       Microalbuminuria  Relevant Medications   dapagliflozin  propanediol (FARXIGA ) 10 MG TABS tablet       Meds ordered this encounter  Medications   hydroxyurea  (HYDREA ) 500 MG capsule    Sig: Take 3 capsules (1,500 mg total) by mouth daily. May take with food to minimize GI side effects.    Dispense:  180 capsule    Refill:  3   folic acid  (FOLVITE ) 1 MG tablet    Sig: Take 1 tablet (1 mg total) by mouth daily.    Dispense:  90 tablet    Refill:  2   dapagliflozin  propanediol (FARXIGA ) 10 MG TABS tablet    Sig: Take 1 tablet (10 mg total) by mouth daily before breakfast.    Dispense:  90 tablet    Refill:  2    Follow-up: Return in about 3 months (around 01/27/2025) for SCD.    Jaquana Geiger R Carole Doner, FNP

## 2024-10-29 NOTE — Patient Instructions (Signed)

## 2024-10-29 NOTE — Assessment & Plan Note (Addendum)
    10/14/2024   11:50 AM 07/30/2024   12:05 PM 04/29/2024   12:10 PM  Depression screen PHQ 2/9  Decreased Interest 3 2 0  Down, Depressed, Hopeless 2 2 0  PHQ - 2 Score 5 4 0  Altered sleeping 1 1 0  Tired, decreased energy 2 1 1   Change in appetite 3 1 0  Feeling bad or failure about yourself  3 0 0  Trouble concentrating 3 0 0  Moving slowly or fidgety/restless 3 0 0  Suicidal thoughts 0 0 0  PHQ-9 Score 20 7  1    Difficult doing work/chores   Not difficult at all     Data saved with a previous flowsheet row definition   Managed with therapy. Prefers non-addictive medications. Therapy provides management tools. - Continue therapy. - Provided printouts on lexapro for review. He denies SI, HI

## 2024-10-29 NOTE — Assessment & Plan Note (Signed)
-   Continue Farxiga  10 mg daily. - Advised taking Farxiga  in the morning with food.

## 2024-10-29 NOTE — Assessment & Plan Note (Signed)
  Reduced to two times per day. Aware of risks with oxycodone . - Encouraged continued reduction, with the aim of quitting

## 2024-10-29 NOTE — Assessment & Plan Note (Signed)
 Continue oxycodone  10 mg every 4 hours as needed for moderate pain , advised Tylenol  650 mg every 6 hours as needed for mild pain

## 2024-10-29 NOTE — Assessment & Plan Note (Signed)
 Smokes about 4 cigarettes /day  Asked about quitting: confirms that he currently smokes cigarettes Advise to quit smoking: Educated about QUITTING to reduce the risk of cancer, cardio and cerebrovascular disease. Assess willingness: Unwilling to quit at this time, but is working on cutting back. Assist with counseling and pharmacotherapy: Counseled for 3 minutes and literature provided. Arrange for follow up: follow up in 3 months and continue to offer help.

## 2024-10-29 NOTE — Assessment & Plan Note (Signed)
 Last vitamin D  Lab Results  Component Value Date   VD25OH 16.0 (L) 07/30/2024   Continue vitamin D  50,000 units once weekly

## 2024-10-29 NOTE — Assessment & Plan Note (Addendum)
 We discussed the need for good hydration, monitoring of hydration status, avoidance of heat, cold, stress, and infection triggers. We discussed the need to be adherent with taking folic acid , Hydrea  and other home medications. Patient was reminded of the need to seek medical attention immediately if any symptom of bleeding, anemia, or infection occurs.   Continue oxycodone  10mg  every 4 hours as needed for moderate pain, advised tylenol  650mg  every 6 hours as needed  Encouraged to keep oxycodone  a a safe and secured cabinet,and  report excessive sedation. Side effects  of medication including constipation reviewed, can take over-the-counter laxative as needed if constipation becomes a problem need to avoid use of illicit drugs discussed.

## 2024-10-30 LAB — CMP14+CBC/D/PLT+FER+RETIC+V...
ALT: 16 IU/L (ref 0–44)
AST: 43 IU/L — ABNORMAL HIGH (ref 0–40)
Albumin: 4.7 g/dL (ref 4.1–5.1)
Alkaline Phosphatase: 86 IU/L (ref 47–123)
BUN/Creatinine Ratio: 8 — ABNORMAL LOW (ref 9–20)
BUN: 8 mg/dL (ref 6–20)
Basophils Absolute: 0.1 x10E3/uL (ref 0.0–0.2)
Basos: 0 %
Bilirubin Total: 3.7 mg/dL — ABNORMAL HIGH (ref 0.0–1.2)
CO2: 24 mmol/L (ref 20–29)
Calcium: 9.7 mg/dL (ref 8.7–10.2)
Chloride: 101 mmol/L (ref 96–106)
Creatinine, Ser: 1.05 mg/dL (ref 0.76–1.27)
EOS (ABSOLUTE): 0.4 x10E3/uL (ref 0.0–0.4)
Eos: 3 %
Ferritin: 224 ng/mL (ref 30–400)
Globulin, Total: 3 g/dL (ref 1.5–4.5)
Glucose: 83 mg/dL (ref 70–99)
Hematocrit: 25.7 % — ABNORMAL LOW (ref 37.5–51.0)
Hemoglobin: 8.6 g/dL — ABNORMAL LOW (ref 13.0–17.7)
Immature Grans (Abs): 0.1 x10E3/uL (ref 0.0–0.1)
Immature Granulocytes: 1 %
Lymphocytes Absolute: 3 x10E3/uL (ref 0.7–3.1)
Lymphs: 22 %
MCH: 35.8 pg — ABNORMAL HIGH (ref 26.6–33.0)
MCHC: 33.5 g/dL (ref 31.5–35.7)
MCV: 107 fL — ABNORMAL HIGH (ref 79–97)
Monocytes Absolute: 2 x10E3/uL — ABNORMAL HIGH (ref 0.1–0.9)
Monocytes: 14 %
NRBC: 1 % — ABNORMAL HIGH (ref 0–0)
Neutrophils Absolute: 8.3 x10E3/uL — ABNORMAL HIGH (ref 1.4–7.0)
Neutrophils: 60 %
Platelets: 377 x10E3/uL (ref 150–450)
Potassium: 5.1 mmol/L (ref 3.5–5.2)
RBC: 2.4 x10E6/uL — CL (ref 4.14–5.80)
RDW: 21.9 % — ABNORMAL HIGH (ref 11.6–15.4)
Retic Ct Pct: 16.9 % — ABNORMAL HIGH (ref 0.6–2.6)
Sodium: 139 mmol/L (ref 134–144)
Total Protein: 7.7 g/dL (ref 6.0–8.5)
Vit D, 25-Hydroxy: 17.9 ng/mL — ABNORMAL LOW (ref 30.0–100.0)
WBC: 13.9 x10E3/uL — ABNORMAL HIGH (ref 3.4–10.8)
eGFR: 94 mL/min/1.73 (ref >59)

## 2024-10-31 ENCOUNTER — Other Ambulatory Visit: Payer: Self-pay

## 2024-10-31 ENCOUNTER — Other Ambulatory Visit (HOSPITAL_COMMUNITY): Payer: Self-pay

## 2024-10-31 ENCOUNTER — Ambulatory Visit: Payer: Self-pay | Admitting: Nurse Practitioner

## 2024-10-31 ENCOUNTER — Other Ambulatory Visit: Payer: Self-pay | Admitting: Nurse Practitioner

## 2024-10-31 DIAGNOSIS — E559 Vitamin D deficiency, unspecified: Secondary | ICD-10-CM

## 2024-10-31 MED ORDER — VITAMIN D (ERGOCALCIFEROL) 1.25 MG (50000 UNIT) PO CAPS
50000.0000 [IU] | ORAL_CAPSULE | ORAL | 3 refills | Status: AC
  Filled 2024-10-31: qty 12, 84d supply, fill #0

## 2024-11-02 LAB — TOXASSURE FLEX 15, UR
6-ACETYLMORPHINE IA: NEGATIVE ng/mL
7-aminoclonazepam: NOT DETECTED ng/mg{creat}
AMPHETAMINES IA: NEGATIVE ng/mL
Alpha-hydroxyalprazolam: NOT DETECTED ng/mg{creat}
Alpha-hydroxymidazolam: NOT DETECTED ng/mg{creat}
Alpha-hydroxytriazolam: NOT DETECTED ng/mg{creat}
Alprazolam: NOT DETECTED ng/mg{creat}
BARBITURATES IA: NEGATIVE ng/mL
Buprenorphine: NOT DETECTED ng/mg{creat}
COCAINE METABOLITE IA: NEGATIVE ng/mL
Clonazepam: NOT DETECTED ng/mg{creat}
Creatinine: 97 mg/dL (ref >=20)
Desalkylflurazepam: NOT DETECTED ng/mg{creat}
Desmethyldiazepam: NOT DETECTED ng/mg{creat}
Desmethylflunitrazepam: NOT DETECTED ng/mg{creat}
Diazepam: NOT DETECTED ng/mg{creat}
ETHYL ALCOHOL Enzymatic: NEGATIVE g/dL
Fentanyl: NOT DETECTED ng/mg{creat}
Flunitrazepam: NOT DETECTED ng/mg{creat}
Lorazepam: NOT DETECTED ng/mg{creat}
METHADONE IA: NEGATIVE ng/mL
METHADONE MTB IA: NEGATIVE ng/mL
Midazolam: NOT DETECTED ng/mg{creat}
Norbuprenorphine: NOT DETECTED ng/mg{creat}
Norfentanyl: NOT DETECTED ng/mg{creat}
OPIATE CLASS IA: NEGATIVE ng/mL
Oxazepam: NOT DETECTED ng/mg{creat}
PHENCYCLIDINE IA: NEGATIVE ng/mL
TAPENTADOL, IA: NEGATIVE ng/mL
TRAMADOL IA: NEGATIVE ng/mL
Temazepam: NOT DETECTED ng/mg{creat}

## 2024-11-02 LAB — CANNABINOIDS, MS, UR RFX
Cannabinoids Confirmation: POSITIVE
Carboxy-THC: 98 ng/mg{creat}

## 2024-11-02 LAB — OXYCODONE CLASS, MS, UR RFX
Noroxycodone: 390 ng/mg{creat}
Noroxymorphone: 209 ng/mg{creat}
Oxycodone Class Confirmation: POSITIVE
Oxycodone: 74 ng/mg{creat}
Oxymorphone: 928 ng/mg{creat}

## 2024-11-04 ENCOUNTER — Other Ambulatory Visit (HOSPITAL_COMMUNITY): Payer: Self-pay

## 2024-11-05 ENCOUNTER — Telehealth: Payer: Self-pay | Admitting: Licensed Clinical Social Worker

## 2024-11-06 ENCOUNTER — Other Ambulatory Visit: Payer: Self-pay | Admitting: Licensed Clinical Social Worker

## 2024-11-06 ENCOUNTER — Ambulatory Visit (INDEPENDENT_AMBULATORY_CARE_PROVIDER_SITE_OTHER): Admitting: Licensed Clinical Social Worker

## 2024-11-06 ENCOUNTER — Encounter (HOSPITAL_COMMUNITY): Payer: Self-pay | Admitting: Licensed Clinical Social Worker

## 2024-11-06 DIAGNOSIS — F322 Major depressive disorder, single episode, severe without psychotic features: Secondary | ICD-10-CM | POA: Diagnosis not present

## 2024-11-06 DIAGNOSIS — F1220 Cannabis dependence, uncomplicated: Secondary | ICD-10-CM
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
## 2024-11-11 ENCOUNTER — Other Ambulatory Visit (HOSPITAL_COMMUNITY): Payer: Self-pay

## 2024-11-13 ENCOUNTER — Other Ambulatory Visit: Payer: Self-pay | Admitting: Nurse Practitioner

## 2024-11-13 DIAGNOSIS — D571 Sickle-cell disease without crisis: Secondary | ICD-10-CM

## 2024-11-13 DIAGNOSIS — Z79891 Long term (current) use of opiate analgesic: Secondary | ICD-10-CM

## 2024-11-14 ENCOUNTER — Other Ambulatory Visit (HOSPITAL_COMMUNITY): Payer: Self-pay

## 2024-11-14 MED ORDER — OXYCODONE HCL 10 MG PO TABS
10.0000 mg | ORAL_TABLET | ORAL | 0 refills | Status: DC | PRN
  Filled 2024-11-14: qty 90, 15d supply, fill #0

## 2024-11-14 NOTE — Telephone Encounter (Signed)
 Reviewed PDMP substance reporting system prior to prescribing opiate medications. No inconsistencies noted.   1. Hb-SS disease without crisis (HCC)  - Oxycodone  HCl 10 MG TABS; Take 1 tablet (10 mg total) by mouth every 4 (four) hours as needed.  Dispense: 90 tablet; Refill: 0  2. Chronic prescription opiate use  - Oxycodone  HCl 10 MG TABS; Take 1 tablet (10 mg total) by mouth every 4 (four) hours as needed.  Dispense: 90 tablet; Refill: 0

## 2024-11-18 ENCOUNTER — Ambulatory Visit (INDEPENDENT_AMBULATORY_CARE_PROVIDER_SITE_OTHER): Admitting: Licensed Clinical Social Worker

## 2024-11-18 DIAGNOSIS — F322 Major depressive disorder, single episode, severe without psychotic features: Secondary | ICD-10-CM

## 2024-11-18 DIAGNOSIS — F1220 Cannabis dependence, uncomplicated: Secondary | ICD-10-CM
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
## 2024-11-26 NOTE — Patient Instructions (Signed)
 Visit Information  Jerry [name] was given information about Medicaid Managed Care team care coordination services as a part of their [facility] Medicaid benefit.   If you would like to schedule transportation through your [facility], please call the following number at least 2 days in advance of your appointment: [phone]   Rides for urgent appointments can also be made after hours by calling Member Services.  Call the Behavioral Health Crisis Line at [phone], at any time, 24 hours a day, 7 days a week. If you are in danger or need immediate medical attention call 911.  Please see education materials related to topics discussed provided by MyChart link.  Care plan and visit instructions communicated with the patient verbally today. Patient agrees to receive a copy in MyChart. Active MyChart status and patient understanding of how to access instructions and care plan via MyChart confirmed with patient.     Licensed Clinical Social Worker will call on 1/8 at 1:30 PM  Jerry [name], Jerry [name]  [facility] Clinical Social Worker Direct Dial: [phone]  Fax: [phone] Website: [email or web address] 3:38 PM   Following is a copy of your plan of care:   Goals Addressed             This Visit's Progress    Jerry VBCI Social Work Care Plan   On track    Problems:   Disease Management support and education needs related to Stress  CSW Clinical Goal(s):   Over the next 90 days the Patient will attend all scheduled medical appointments as evidenced by patient report and care team review of appointment completion in electronic MEDICAL RECORD NUMBER  demonstrate a reduction in symptoms related to Stress .  Interventions:  Mental Health:  Evaluation of current treatment plan related to Stress Active listening / Reflection utilized Financial Risk Analyst / information provided Discussed  referral options to connect for ongoing therapy: Jerry discussed local counseling resources, including [facility] and provided info via email. Jerry messaged PCP to complete internal referral Emotional Support Provided Mindfulness or Relaxation training provided Provided general psycho-education for mental health needs Quality of sleep assessed & Sleep Hygiene techniques promoted Suicidal Ideation/Homicidal Ideation assessed: Pt denies Healthy coping skills discussed to assist with stress management  Patient Goals/Self-Care Activities:  Continue taking your medication as prescribed.   Increase coping skills and stress reduction Review supportive resources discussed and provided via email Continue participating in therapy. F/up with PCP about medication management  Plan:   Telephone follow up appointment with care management team member scheduled for:  4 weeks

## 2024-11-26 NOTE — Patient Outreach (Signed)
 Complex Care Management   Visit Note  11/06/2024  Name:  Jerry [name] MRN: [id number] DOB: [birth year]  Situation: Referral received for Complex Care Management related to Mental/Behavioral Health diagnosis Depression and Anxiety I obtained verbal consent from Patient.  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   Annotation: Sludge without cholelithiasis  Qualifier: Diagnosis of   By: End MD, Lonni      Replacing diagnoses that were inactivated after the 02/26/23 regulatory import     MARIJUANA ABUSE 02/15/2007   Qualifier: Diagnosis of   By: End MD, Lonni      Replacing diagnoses that were inactivated after the 02/26/23 regulatory import     Persistent proteinuria 10/07/2019   Sickle cell anemia (HCC)     Assessment: Patient Reported Symptoms:  Cognitive Cognitive Status: No symptoms reported, Alert and oriented to person, place, and time, Normal speech and language skills Cognitive/Intellectual Conditions Management [RPT]: None reported or documented in medical history or problem list   Health Maintenance Behaviors: Annual physical exam  Neurological Neurological Review of Symptoms: Not assessed    HEENT HEENT Symptoms Reported: Not assessed      Cardiovascular Cardiovascular Symptoms Reported: Not assessed    Respiratory Respiratory Symptoms Reported: Not assesed    Endocrine Endocrine Symptoms Reported: Not assessed    Gastrointestinal Gastrointestinal Symptoms Reported: Not assessed      Genitourinary Genitourinary Symptoms Reported: Not assessed    Integumentary Integumentary Symptoms Reported: Not assessed    Musculoskeletal Musculoskelatal Symptoms Reviewed: Not assessed        Psychosocial Psychosocial Symptoms Reported: Anger, Irritability Behavioral Management Strategies: Adequate rest, Support system,  Counseling Major Change/Loss/Stressor/Fears (CP): Medical condition, self Techniques to Cope with Loss/Stress/Change: Diversional activities, Counseling      11/26/2024    PHQ2-9 Depression Screening   Little interest or pleasure in doing things    Feeling down, depressed, or hopeless    PHQ-2 - Total Score    Trouble falling or staying asleep, or sleeping too much    Feeling tired or having little energy    Poor appetite or overeating     Feeling bad about yourself - or that you are a failure or have let yourself or your family down    Trouble concentrating on things, such as reading the newspaper or watching television    Moving or speaking so slowly that other people could have noticed.  Or the opposite - being so fidgety or restless that you have been moving around a lot more than usual    Thoughts that you would be better off dead, or hurting yourself in some way    PHQ2-9 Total Score    If you checked off any problems, how difficult have these problems made it for you to do your work, take care of things at home, or get along with other people    Depression Interventions/Treatment      There were no vitals filed for this visit.    Medications Reviewed Today     Reviewed by Jaiveon Suppes D, LCSW (Social Worker) on 11/26/24 at 1529  Med List Status: <None>   Medication Order Taking? Sig Documenting Provider Last Dose Status Informant  dapagliflozin  propanediol (FARXIGA ) 10 MG TABS tablet [id number]  Take 1 tablet (10 mg total) by mouth daily before breakfast. Paseda, Folashade R, FNP  Active   folic  acid (FOLVITE ) 1 MG tablet [id number]  Take 1 tablet (1 mg total) by mouth daily. Paseda, Folashade R, FNP  Active   hydroxyurea  (HYDREA ) 500 MG capsule [id number]  Take 3 capsules (1,500 mg total) by mouth daily. May take with food to minimize GI side effects. Paseda, Folashade R, FNP  Active   Oxycodone  HCl 10 MG TABS [id number]  Take 1 tablet (10 mg total) by mouth every 4 (four)  hours as needed. Paseda, Folashade R, FNP  Active   Vitamin D , Ergocalciferol , (DRISDOL ) 1.25 MG (50000 UNIT) CAPS capsule [id number]  Take 1 capsule (50,000 Units total) by mouth every 7 (seven) days. Paseda, Folashade R, FNP  Active             Recommendation:   Continue Current Plan of Care  Follow Up Plan:   Telephone follow-up in 1 month  Rolin Kerns, LCSW [facility]  [facility], [facility] Clinical Social Worker Direct Dial: [phone]  Fax: [phone] Website: [email or web address] 3:37 PM

## 2024-12-01 ENCOUNTER — Other Ambulatory Visit: Payer: Self-pay | Admitting: Nurse Practitioner

## 2024-12-01 ENCOUNTER — Other Ambulatory Visit (HOSPITAL_COMMUNITY): Payer: Self-pay

## 2024-12-01 ENCOUNTER — Telehealth: Admitting: Nurse Practitioner

## 2024-12-01 ENCOUNTER — Other Ambulatory Visit: Payer: Self-pay

## 2024-12-01 VITALS — Ht 71.0 in | Wt 171.0 lb

## 2024-12-01 DIAGNOSIS — Z79891 Long term (current) use of opiate analgesic: Secondary | ICD-10-CM

## 2024-12-01 DIAGNOSIS — D571 Sickle-cell disease without crisis: Secondary | ICD-10-CM

## 2024-12-01 DIAGNOSIS — F332 Major depressive disorder, recurrent severe without psychotic features: Secondary | ICD-10-CM

## 2024-12-01 DIAGNOSIS — F419 Anxiety disorder, unspecified: Secondary | ICD-10-CM

## 2024-12-01 MED ORDER — ESCITALOPRAM OXALATE 10 MG PO TABS
10.0000 mg | ORAL_TABLET | Freq: Every day | ORAL | 1 refills | Status: AC
  Filled 2024-12-01 (×2): qty 60, 60d supply, fill #0

## 2024-12-01 MED ORDER — OXYCODONE HCL 10 MG PO TABS
10.0000 mg | ORAL_TABLET | ORAL | 0 refills | Status: DC | PRN
  Filled 2024-12-01: qty 90, 15d supply, fill #0

## 2024-12-01 NOTE — Assessment & Plan Note (Addendum)
" °    12/01/2024    1:06 PM 10/14/2024   11:51 AM 07/30/2024   12:17 PM 04/29/2024   12:14 PM  GAD 7 : Generalized Anxiety Score  Nervous, Anxious, on Edge 2 3 0 0  Control/stop worrying 2 3 1  0  Worry too much - different things 2 3 1  0  Trouble relaxing 2 3 0 0  Restless 1 3 1  0  Easily annoyed or irritable 3 3 3  0  Afraid - awful might happen 0 3 0 0  Total GAD 7 Score 12 21 6  0  Anxiety Difficulty Very difficult Extremely difficult Not difficult at all Not difficult at all    Symptoms exacerbated by family and work-related stressors. Therapy ongoing. - Started Lexapro  10 mg daily. - Continue therapy sessions. - Monitor for side effects as listed for major depressive disorder. - Seek urgent medical attention if experiencing severe side effects or thoughts of self-harm.    "

## 2024-12-01 NOTE — Progress Notes (Signed)
 Called and left a V/M to schedule an appointment. 12/01/24

## 2024-12-01 NOTE — Telephone Encounter (Signed)
 Please advise

## 2024-12-01 NOTE — Patient Instructions (Signed)
 1. Anxiety (Primary) - escitalopram  (LEXAPRO ) 10 MG tablet; Take 1 tablet (10 mg total) by mouth daily.  Dispense: 60 tablet; Refill: 1  2. Severe episode of recurrent major depressive disorder, without psychotic features (HCC) - escitalopram  (LEXAPRO ) 10 MG tablet; Take 1 tablet (10 mg total) by mouth daily.  Dispense: 60 tablet; Refill: 1   Behavioral Health Resources:    What if I or someone I know is in crisis?   If you are thinking about harming yourself or having thoughts of suicide, or if you know someone who is, seek help right away.   Call your doctor or mental health care provider.   Call 911 or go to a hospital emergency room to get immediate help, or ask a friend or family member to help you do these things; IF YOU ARE IN [place], YOU MAY GO TO WALK-IN URGENT CARE 24/7 at [facility] (see below)   Call the [place]  National Suicide Prevention Lifelines toll-free, 24-hour hotline at 1-800-273-TALK 639-034-1777) or TTY: 1-800-799-4 TTY (437)130-5218) to talk to a trained counselor.   If you are in crisis, make sure you are not left alone.    If someone else is in crisis, make sure he or she is not left alone     24 Hour :    [facility]  [address] [place], [place] [address] [phone] or [phone] WALK-IN URGENT CARE 24/7   [facility] Mobile Crisis: [phone]   [place]  National Suicide Hotline: 360 690 3523   [facility] [employer] (Domestic Violence, Rape & Victim Assistance)  787-111-0611   [facility]  [address], [place]  [address]   [phone] or [phone]    [facility] [employer] [facility]: [phone] (8am-4pm) or [phone] (after hours)          [facility], [address] [place], [place]  [phone] Fax: [phone]  [email or web address] *Interpreters available *Accepts all insurance and uninsured for Urgent Care needs *Accepts Medicaid and uninsured for outpatient treatment    [facility]   Mon-Fri: 8am-5pm [address], [place], [place] [phone](eynwz); [phone](fax) [email or web address]  *Accepts Medicare   [facility] [place], Fri: 8am-4pm [address], [place], [place] [address] [phone] (phone); [phone] (fax) [email or web address]  *Accepts Medicare   [facility] Mon-Fri: 9am-5pm  [address], [place], [place] [phone] (phone); [phone]  [email or web address]  *Accepts Medicaid   [facility], 8:30am-12pm/1pm-2:30pm [address], [place], [place] [phone] (phone); [phone] (fax) [email or web address].[email or web address]  *Accepts Medicaid, sliding-scale*Bilingual services available   [facility] Mon-Fri, 8am-7pm [address], [place], [place]  [phone](eynwz); [phone](fax) [email or web address].[email or web address]  *Accepts Medicaid *Bilingual services available   [facility] Mon-Fri: 8am-5pm, Saturday by appointment only [address] [place], [place], [place] [phone] (phone); [phone] (fax) [email or web address]    [facility] [address], [place], [place] [phone] [email or web address]  *Free & reduced services for uninsured and underinsured individuals *Bilingual services for Spanish-speaking clients 21 and under   [facility] [address] [place], [place] [phone](eynwz); [phone](fax) [email or web address]  *Bring your own interpreter at first visit *Accepts Medicare and [facility]   [facility] Mon-Fri: 9am-5:30pm [address], [place], [place] [phone] (phone), [phone] (fax) After hours crisis line: [phone] [email or web address]  *Accepts Medicare and Medicaid   [facility] Mon-Thurs, 8am-6pm [address] [place], [place]  [phone] (phone); [phone] (fax) [email or web address]  *Subsidized costs available   [facility] Mon-Fri: 8am-4pm [address], [place], [place] [phone](eynwz); [phone](fax) [email or web address]  *Accepts Medicaid   [facility] Same day access hours: Mon-Fri, 8:30-3pm Crisis hours: Mon-Fri, 8am-5pm [address], [place], [place] [phone]   [facility] [employer] Same day access hours: Mon-Fri, 8:30-3pm Crisis hours: Mon-Fri, 8am-8pm [address], [place], [place] [phone] (phone); [phone] (fax) [email or web address].[email or web address]  *Accepts Medicaid and Medicare   [facility], [place], Fri: 9am-9pm Tues, Thurs: 9am-6pm [address] [place], [place], [place]  [phone] (phone); [phone] (fax) [email or web address]  *(Accepts Medicare and Medicaid; payment plans available)*Bilingual services available   [facility] [address], [place], [place] [phone] (phone); [phone] (fax) [email or web address]    [facility] [address], [place], [place]  [phone]  [email or web address]  *Bilingual services available   [facility]) Mon-Thurs: 8am-8pm [address], [place], [place] [phone] (phone); [phone] (fax) [email or web address]  *Accepts Medicaid*Bilingual services available   [facility] [address] [place], [place] [phone] (phone) [email or web address]  *Accepts Medicaid *Bilingual services available   [facility] Mon-Fri,  9am-4:45pm [address], [place], [place] [phone] (phone); [phone] (fax) [email or web address]  *Accepts Medicare   [facility] Mon-Thurs: 8:30-8pm, Fri: 8:30am-7pm [address], [place], [place] (3rd floor) [phone] (phone); [phone] (fax) [email or web address]  *Accepts Medicaid; income-based reduced rates available   [facility] Mon-Fri: 8am-5pm [address], [place], [place] [address] [phone] (phone); [phone] (fax) [email or web address]  *Accepts Medicaid*Bilingual services available     [facility] [place])  [address] [place] [phone] www.[email or web address]  *Provides direct services to individuals in recovery from mental illness, including support groups, recovery skills classes, and one on one peer support   [employer] on Mental Illness) [place] helpline: [phone]  [employer] [place] helpline: (336)081-9100 https://namiguilford.org  *A community hub for information relating to local resources and services for the friends and families of individuals living alongside a mental health condition, as well as the individuals themselves. Classes and support groups also provided      It is important that you exercise regularly at least 30 minutes 5 times a week as tolerated  Think about what you will eat, plan ahead. Choose  clean, green, fresh or frozen over canned, processed or packaged foods which are more sugary, salty and fatty. 70 to 75% of food eaten should be vegetables and fruit. Three meals at set times with snacks allowed between meals, but they must be fruit or vegetables. Aim to eat over a 12 hour period , example 7 am to 7 pm, and STOP after  your last meal of the day. Drink water,generally about 64 ounces per day, no other drink is as healthy. Fruit juice is best enjoyed in a healthy way, by EATING the fruit.  Thanks for  choosing [facility] we consider it a privelige to serve you.

## 2024-12-01 NOTE — Assessment & Plan Note (Signed)
" °  Sickle cell anemia Chronic condition managed with oxycodone  for pain. - Continue oxycodone  10 mg every 4 hours as needed chronic pain as prescribed.  Need to avoid use of illicit drugs or engaging in drug selling or diverting medication  discussed  Continue hydroxyurea  1500 mg daily, folic acid  1 mg daily "

## 2024-12-01 NOTE — Telephone Encounter (Signed)
Oxycodone HCl 10 MG TABS

## 2024-12-01 NOTE — Assessment & Plan Note (Signed)
" °    12/01/2024    1:10 PM 10/14/2024   11:50 AM 07/30/2024   12:05 PM  Depression screen PHQ 2/9  Decreased Interest 3 3 2   Down, Depressed, Hopeless 3 2 2   PHQ - 2 Score 6 5 4   Altered sleeping 3 1 1   Tired, decreased energy 1 2 1   Change in appetite 2 3 1   Feeling bad or failure about yourself  3 3 0  Trouble concentrating 3 3 0  Moving slowly or fidgety/restless 3 3 0  Suicidal thoughts 0 0 0  PHQ-9 Score 21 20 7    Difficult doing work/chores Extremely dIfficult       Data saved with a previous flowsheet row definition   Major depressive disorder, recurrent severe Recurrent severe major depressive disorder with contributing factors including childhood trauma, family issues, and financial stress. No suicidal ideation. Therapy ongoing. - Started Lexapro  10 mg daily. - Continue therapy sessions. - Monitor for side effects such as nausea, stomach upset, diarrhea, excessive sweating, changes in sex drive, and performance. - Seek urgent medical attention if experiencing bleeding, fast heartbeat, dizziness, fainting, confusion, sudden eye pain, changes in vision, or thoughts of self-harm. - Follow up with psychiatrist on February 23rd, 2026.  "

## 2024-12-01 NOTE — Progress Notes (Signed)
 Virtual Visit via Video  Note  I connected with Jerry [name] @ on 12/01/2024 at  1:00 PM EST by video and verified that I am speaking with the correct person using two identifiers.  I spent 17 minutes on this encounter  Location: Patient: home Provider: office   I discussed the limitations, risks, security and privacy concerns of performing an evaluation and management service by telephone and the availability of in person appointments. I also discussed with the patient that there may be a patient responsible charge related to this service. The patient expressed understanding and agreed to proceed.   History of Present Illness: Discussed the use of AI scribe software for clinical note transcription with the patient, who gave verbal consent to proceed.  History of Present Illness Jerry [name] is a 39 year old male  has a past medical history of Chronic prescription opiate use (10/07/2019), GALLBLADDER DISEASE (02/07/2007), MARIJUANA ABUSE (02/15/2007), Persistent proteinuria (10/07/2019), and Sickle cell anemia (HCC).  anxiety and depression who presents for follow-up for anxiety and depression  He experiences significant anxiety and depression, which he attributes to childhood trauma and ongoing family issues. Symptoms include restlessness, overthinking, and difficulty expressing himself, often leading to 'shutting down' in conversations. He constantly worries about the outcomes of his interactions, affecting both his work and personal life.  He has been in therapy and has discussed financial and familial stressors, including a recent incident where he considered selling drugs to provide for his children during the holidays. He clarifies that he does not engage in illegal activities and works honestly to support his family.  He is currently taking oxycodone  10 mg every 4 hours as needed for sickle cell pain. He has a history of selling drugs from a young age due to growing up in  poverty but emphasizes that he does not currently sell drugs and is focused on providing for his family through legal means.  Assessment & Plan      Observations/Objective: Patient alert and oriented no sign of distress noted  Assessment and Plan: Anxiety    12/01/2024    1:06 PM 10/14/2024   11:51 AM 07/30/2024   12:17 PM 04/29/2024   12:14 PM  GAD 7 : Generalized Anxiety Score  Nervous, Anxious, on Edge 2 3 0 0  Control/stop worrying 2 3 1  0  Worry too much - different things 2 3 1  0  Trouble relaxing 2 3 0 0  Restless 1 3 1  0  Easily annoyed or irritable 3 3 3  0  Afraid - awful might happen 0 3 0 0  Total GAD 7 Score 12 21 6  0  Anxiety Difficulty Very difficult Extremely difficult Not difficult at all Not difficult at all    Symptoms exacerbated by family and work-related stressors. Therapy ongoing. - Started Lexapro  10 mg daily. - Continue therapy sessions. - Monitor for side effects as listed for major depressive disorder. - Seek urgent medical attention if experiencing severe side effects or thoughts of self-harm.     Depression, major, recurrent    12/01/2024    1:10 PM 10/14/2024   11:50 AM 07/30/2024   12:05 PM  Depression screen PHQ 2/9  Decreased Interest 3 3 2   Down, Depressed, Hopeless 3 2 2   PHQ - 2 Score 6 5 4   Altered sleeping 3 1 1   Tired, decreased energy 1 2 1   Change in appetite 2 3 1   Feeling bad or failure about yourself  3 3 0  Trouble  concentrating 3 3 0  Moving slowly or fidgety/restless 3 3 0  Suicidal thoughts 0 0 0  PHQ-9 Score 21 20 7    Difficult doing work/chores Extremely dIfficult       Data saved with a previous flowsheet row definition   Major depressive disorder, recurrent severe Recurrent severe major depressive disorder with contributing factors including childhood trauma, family issues, and financial stress. No suicidal ideation. Therapy ongoing. - Started Lexapro  10 mg daily. - Continue therapy sessions. - Monitor for side  effects such as nausea, stomach upset, diarrhea, excessive sweating, changes in sex drive, and performance. - Seek urgent medical attention if experiencing bleeding, fast heartbeat, dizziness, fainting, confusion, sudden eye pain, changes in vision, or thoughts of self-harm. - Follow up with psychiatrist on February 23rd, 2026.   Hb-SS disease without crisis (HCC)  Sickle cell anemia Chronic condition managed with oxycodone  for pain. - Continue oxycodone  10 mg every 4 hours as needed chronic pain as prescribed.  Need to avoid use of illicit drugs or engaging in drug selling or diverting medication  discussed  Continue hydroxyurea  1500 mg daily, folic acid  1 mg daily   Follow Up Instructions:    I discussed the assessment and treatment plan with the patient. The patient was provided an opportunity to ask questions and all were answered. The patient agreed with the plan and demonstrated an understanding of the instructions.   The patient was advised to call back or seek an in-person evaluation if the symptoms worsen or if the condition fails to improve as anticipated.

## 2024-12-02 ENCOUNTER — Telehealth (HOSPITAL_COMMUNITY): Payer: Self-pay | Admitting: Licensed Clinical Social Worker

## 2024-12-02 ENCOUNTER — Ambulatory Visit (HOSPITAL_COMMUNITY): Admitting: Licensed Clinical Social Worker

## 2024-12-02 ENCOUNTER — Other Ambulatory Visit: Payer: Self-pay

## 2024-12-02 DIAGNOSIS — Z5329 Procedure and treatment not carried out because of patient's decision for other reasons: Secondary | ICD-10-CM

## 2024-12-02 NOTE — Telephone Encounter (Signed)
 The therapist calls Jerry [name] confirming his identity via two identifiers and that he is where he can speak privately.  The therapist lets him know that he has spoken with his Supervisor and that the concerns he detailed today are being investigated.  Jerry [name] thanks the therapist for this update.  Zell Maier, MA, LCSW, [facility], LCAS 12/02/2024

## 2024-12-02 NOTE — Progress Notes (Signed)
 THERAPIST PROGRESS NOTE  Session Time: 3:10 p.m. to 3:38 p.m.    Jerry [name] presents saying that he met with Ms. Jerry [name]. FNP for a tele-video session with Jerry [name]'s wife being present I the background. During this session, she referenced Jerry [name]'s having recently sold drugs apparently wanting to see if he was selling the pain medication which she prescribed to him.  Jerry [name] says that he was very angry and does not understand how she knows this information as it was his understanding that what he told this therapist was confidential and would not be shared without a ROI.  Also, he says that he got stopped over a year ago as his mom made a call to Jerry [name] that he had a gun. When he was stopped by Jerry [name], no gun was found but he did have some drugs in the car so indicates that he is just about to get off whatever legal consequences he received after about a year so does not want any information about substances being relayed to Jerry [name] and now doubts confidentiality as it relates to his Jerry [name] records.  He does say that his FNP put him on an anti-depressant which seems to be helping now. This therapist validates his feelings about this and informs him that he does not know how his FNP gained access to information from his therapy note; however, he will report this to his Supervisors to investigate this further.  Jerry [name] gives permission for this therapist's Supervisors. Mr. Jerry Birmingham, MSN, BSN, RN-BC and [name], Engineer, Manufacturing to contact him directly about this situation.   Jerry [name] says that he did not plan on staying the entire session and does not wish to schedule any additional appointments until this confidentiality situation is addressed.    Zell Maier, MA, LCSW, [facility], LCAS 12/02/2024

## 2024-12-04 ENCOUNTER — Other Ambulatory Visit: Payer: Self-pay | Admitting: Licensed Clinical Social Worker

## 2024-12-05 ENCOUNTER — Telehealth (HOSPITAL_COMMUNITY): Payer: Self-pay | Admitting: Licensed Clinical Social Worker

## 2024-12-05 NOTE — Patient Outreach (Signed)
 Complex Care Management   Visit Note  12/04/2024  Name:  Jerry [name] MRN: [id number] DOB: [birth year]  Situation: Referral received for Complex Care Management related to Mental/Behavioral Health diagnosis Anxiety and Depression I obtained verbal consent from Patient.  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   Annotation: Sludge without cholelithiasis  Qualifier: Diagnosis of   By: End MD, Lonni      Replacing diagnoses that were inactivated after the 02/26/23 regulatory import     MARIJUANA ABUSE 02/15/2007   Qualifier: Diagnosis of   By: End MD, Lonni      Replacing diagnoses that were inactivated after the 02/26/23 regulatory import     Persistent proteinuria 10/07/2019   Sickle cell anemia (HCC)     Assessment: Patient Reported Symptoms:  Cognitive Cognitive Status: No symptoms reported, Alert and oriented to person, place, and time, Normal speech and language skills Cognitive/Intellectual Conditions Management [RPT]: None reported or documented in medical history or problem list   Health Maintenance Behaviors: Annual physical exam  Neurological Neurological Review of Symptoms: Not assessed    HEENT HEENT Symptoms Reported: Not assessed      Cardiovascular Cardiovascular Symptoms Reported: Not assessed    Respiratory Respiratory Symptoms Reported: Not assesed    Endocrine Endocrine Symptoms Reported: Not assessed    Gastrointestinal Gastrointestinal Symptoms Reported: No symptoms reported Gastrointestinal Management Strategies: Coping strategies Gastrointestinal Comment: Patient reported stomach upset 24-48 hrs after starting new medication. Symptoms have dissolved    Genitourinary Genitourinary Symptoms Reported: Not assessed    Integumentary Integumentary Symptoms Reported: Not assessed    Musculoskeletal Musculoskelatal  Symptoms Reviewed: Not assessed        Psychosocial Psychosocial Symptoms Reported: Other Other Psychosocial Conditions: Stress Behavioral Management Strategies: Coping strategies, Counseling, Medication therapy, Support system Major Change/Loss/Stressor/Fears (CP): Medical condition, self Techniques to Cope with Loss/Stress/Change: Diversional activities, Counseling, Medication      12/05/2024    PHQ2-9 Depression Screening   Little interest or pleasure in doing things    Feeling down, depressed, or hopeless    PHQ-2 - Total Score    Trouble falling or staying asleep, or sleeping too much    Feeling tired or having little energy    Poor appetite or overeating     Feeling bad about yourself - or that you are a failure or have let yourself or your family down    Trouble concentrating on things, such as reading the newspaper or watching television    Moving or speaking so slowly that other people could have noticed.  Or the opposite - being so fidgety or restless that you have been moving around a lot more than usual    Thoughts that you would be better off dead, or hurting yourself in some way    PHQ2-9 Total Score    If you checked off any problems, how difficult have these problems made it for you to do your work, take care of things at home, or get along with other people    Depression Interventions/Treatment      There were no vitals filed for this visit.    Medications Reviewed Today     Reviewed by Ezzard Rolin BIRCH, LCSW (Social Worker) on 12/05/24 at 1656  Med List Status: <None>   Medication Order Taking? Sig Documenting Provider Last Dose Status Informant  dapagliflozin  propanediol (FARXIGA ) 10  MG TABS tablet [id number]  Take 1 tablet (10 mg total) by mouth daily before breakfast. Paseda, Folashade R, FNP  Active   escitalopram  (LEXAPRO ) 10 MG tablet [id number]  Take 1 tablet (10 mg total) by mouth daily. Paseda, Folashade R, FNP  Active   folic acid  (FOLVITE ) 1 MG tablet  [id number]  Take 1 tablet (1 mg total) by mouth daily. Paseda, Folashade R, FNP  Active   hydroxyurea  (HYDREA ) 500 MG capsule [id number]  Take 3 capsules (1,500 mg total) by mouth daily. May take with food to minimize GI side effects. Paseda, Folashade R, FNP  Active   Oxycodone  HCl 10 MG TABS [id number]  Take 1 tablet (10 mg total) by mouth every 4 (four) hours as needed. Paseda, Folashade R, FNP  Active   Vitamin D , Ergocalciferol , (DRISDOL ) 1.25 MG (50000 UNIT) CAPS capsule [id number]  Take 1 capsule (50,000 Units total) by mouth every 7 (seven) days. Paseda, Folashade R, FNP  Active             Recommendation:   Continue Current Plan of Care  Follow Up Plan:   Telephone follow-up in 1 month  Rolin Kerns, LCSW [facility]  [facility], [facility] Clinical Social Worker Direct Dial: [phone]  Fax: [phone] Website: [email or web address] 5:03 PM

## 2024-12-05 NOTE — Telephone Encounter (Signed)
 The therapist calls Jerry [name] confirming his identity via two identifiers. The therapist explains that the situation regarding how his NP knew what he discussed in his most recent therapy session was investigated and this therapist was informed that in order for her to view this information that she had to break the glass superceding the block on her ability to view this therapy note.   The therapist informs Jerry [name] that this therapist was provided an alternate way to enter his notes in the future such that the NP and any other medical provider would be unable to break the glass to view therapy notes in the future. This information is to be provided to all agency therapists in addition to this therapist's Supervisor indicating that he will ask if all forms of note input for therapy notes can default to the security level no longer making it even possible to break the glass.  Jerry [name] thanks this therapist for this information and indicates that he will address this situation with his NP at his next visit. He schedules another follow-up appointment with this therapist.  Jerry Maier, MA, LCSW, [facility], LCAS 12/05/2024

## 2024-12-05 NOTE — Patient Instructions (Signed)
 Visit Information  Mr. Jerry [name] was given information about Medicaid Managed Care team care coordination services as a part of their [facility] Community Plan Medicaid benefit.   If you would like to schedule transportation through your [facility], please call the following number at least 2 days in advance of your appointment: [phone]   Rides for urgent appointments can also be made after hours by calling Member Services.  Call the Behavioral Health Crisis Line at [phone], at any time, 24 hours a day, 7 days a week. If you are in danger or need immediate medical attention call 911.  Please see education materials related to topics discussed provided by MyChart link.  Care plan and visit instructions communicated with the patient verbally today. Patient agrees to receive a copy in MyChart. Active MyChart status and patient understanding of how to access instructions and care plan via MyChart confirmed with patient.     Licensed Clinical Social Worker will call you on 2/5 at 1:30 AM  Rolin Kerns, LCSW [place]  [facility], [facility] Clinical Social Worker Direct Dial: [phone]  Fax: [phone] Website: [email or web address] 5:04 PM   Following is a copy of your plan of care:   Goals Addressed             This Visit's Progress    LCSW VBCI Social Work Care Plan   On track    Problems:   Disease Management support and education needs related to Stress  CSW Clinical Goal(s):   Over the next 90 days the Patient will attend all scheduled medical appointments as evidenced by patient report and care team review of appointment completion in electronic MEDICAL RECORD NUMBER  demonstrate a reduction in symptoms related to Stress .  Interventions:  Mental Health:  Evaluation of current treatment plan related to Stress Active listening / Reflection utilized Financial Risk Analyst / information provided Patient is  participating in therapy with [facility]. Has scheduled appt for med management February 2026 Patient reports compliance with medication and denies any adverse side effects that lasted for over 48 hrs Emotional Support and Validation Provided Provided general psycho-education for mental health needs Suicidal Ideation/Homicidal Ideation assessed: Pt denies Healthy coping skills discussed to assist with stress management  Patient Goals/Self-Care Activities:  Continue taking your medication as prescribed.   Increase coping skills and stress reduction Review supportive resources discussed and provided via email Continue participating in therapy  Plan:   Telephone follow up appointment with care management team member scheduled for:  4 weeks

## 2024-12-16 ENCOUNTER — Other Ambulatory Visit: Payer: Self-pay

## 2024-12-16 ENCOUNTER — Other Ambulatory Visit (HOSPITAL_COMMUNITY): Payer: Self-pay

## 2024-12-16 ENCOUNTER — Other Ambulatory Visit: Payer: Self-pay | Admitting: Nurse Practitioner

## 2024-12-16 DIAGNOSIS — D571 Sickle-cell disease without crisis: Secondary | ICD-10-CM

## 2024-12-16 DIAGNOSIS — Z79891 Long term (current) use of opiate analgesic: Secondary | ICD-10-CM

## 2024-12-16 MED ORDER — OXYCODONE HCL 10 MG PO TABS
10.0000 mg | ORAL_TABLET | ORAL | 0 refills | Status: DC | PRN
  Filled 2024-12-16: qty 90, 15d supply, fill #0

## 2024-12-16 NOTE — Telephone Encounter (Signed)
 Please advise [facility]

## 2024-12-16 NOTE — Telephone Encounter (Signed)
Oxycodone HCl 10 MG TABS

## 2024-12-23 ENCOUNTER — Telehealth (HOSPITAL_COMMUNITY): Payer: Self-pay | Admitting: Licensed Clinical Social Worker

## 2024-12-23 ENCOUNTER — Ambulatory Visit (INDEPENDENT_AMBULATORY_CARE_PROVIDER_SITE_OTHER): Admitting: Licensed Clinical Social Worker

## 2024-12-23 DIAGNOSIS — F322 Major depressive disorder, single episode, severe without psychotic features: Secondary | ICD-10-CM

## 2024-12-23 DIAGNOSIS — F1220 Cannabis dependence, uncomplicated: Secondary | ICD-10-CM

## 2024-12-23 NOTE — Progress Notes (Signed)
 THERAPIST PROGRESS NOTE  Session Time: 11:10 a.m. to  12:35 p.m.   Type of Therapy: Individual   Therapist Response/Interventions: Solution-Focused/The therapist discusses the interconnection between his cannabis use and his paranoia and panic attacks encouraging Claiborne to consider a life of sobriety. Additionally, he suggests that Fischer would be less hypervigilant about being pulled by [employer] when driving and smoking if he did not smoke at all. The therapist suggests that the likelihood of violence greatly decreases when one avoids places where alcohol and other substances are being consumed in excess such as clubs. The therapist suggests that Abdirahim should focus more on hanging out where nice people go.   Treatment Goals addressed:  Active     OP Depression     LTG: Jeriel will report stability in his mood as evidenced by his PHQ-9 and GAD-7 scores both being a 4 or less in addition to confidence in his ability to resolve interpersonal conflict in an assertive vs an aggressive manner per self-report. (Not Progressing)     Start:  10/14/24    Expected End:  04/13/25                  Summary: Meda presents saying that the Lexapro  has been pretty decent. At first, he says that it made him nauseous. He started noticing how much calmer he was when he had people behaving aggressively towards him. He says that he is getting along better with his wife as he is more attentive and less reactive.  On the other hand, he is having panic attacks more frequently. He says that once he has time to be himself and breathe that the panic will go away. He notices that it happens more frequently when he is in crowd. His wife says that he is a paranoid person when he smokes some weed.  In addition to Sneijder's smoking weed, it sounds as though his wife and his male friend also smoke. The major focus of the session involves how marijuana can contribute both to his panic attacks  and paranoia.  Some of Danh's hypervigilance also stems from his background and the fact that much of his previous involvement in a gang was in [place] so he fears running into to these people out in public. He does say that he considered moving away from [place] altogether but will not do so until one of his family members passes away.     Progress Towards Goals: Not progressing  Suicidal/Homicidal: No SI or HI  Plan: Return again in 1 week.  Diagnosis: Major depression, single episode, severe (rule out PTSD and rule out unspecified bipolar disorder) and Cannabis Use Disorder  Collaboration of Care: Other N/A  Patient/Guardian was advised Release of Information must be obtained prior to any record release in order to collaborate their care with an outside provider. Patient/Guardian was advised if they have not already done so to contact the registration department to sign all necessary forms in order for us  to release information regarding their care.   Consent: Patient/Guardian gives verbal consent for treatment and assignment of benefits for services provided during this visit. Patient/Guardian expressed understanding and agreed to proceed.   Zell Maier, MA, LCSW, [facility], LCAS 12/23/2024

## 2024-12-23 NOTE — Telephone Encounter (Signed)
 The therapist returns Jerry [name]'s call confirming his identity via two identifiers. He says that he is not sure if he will be able to make his 11 a.m. appointment due to ice but will let this therapist know around 10 a.m. if he cannot make it.  Zell Maier, MA, LCSW, [facility], LCAS 12/23/2024

## 2024-12-31 ENCOUNTER — Other Ambulatory Visit: Payer: Self-pay | Admitting: Nurse Practitioner

## 2024-12-31 ENCOUNTER — Other Ambulatory Visit (HOSPITAL_COMMUNITY): Payer: Self-pay

## 2024-12-31 DIAGNOSIS — D571 Sickle-cell disease without crisis: Secondary | ICD-10-CM

## 2024-12-31 DIAGNOSIS — Z79891 Long term (current) use of opiate analgesic: Secondary | ICD-10-CM

## 2024-12-31 MED ORDER — OXYCODONE HCL 10 MG PO TABS
10.0000 mg | ORAL_TABLET | ORAL | 0 refills | Status: AC | PRN
  Filled 2024-12-31 (×2): qty 90, 15d supply, fill #0

## 2024-12-31 NOTE — Telephone Encounter (Signed)
Oxycodone HCl 10 MG TABS

## 2024-12-31 NOTE — Telephone Encounter (Signed)
 Please Advise.  CB.

## 2025-01-01 ENCOUNTER — Ambulatory Visit (HOSPITAL_COMMUNITY): Admitting: Licensed Clinical Social Worker

## 2025-01-01 ENCOUNTER — Telehealth (HOSPITAL_COMMUNITY): Payer: Self-pay | Admitting: Licensed Clinical Social Worker

## 2025-01-01 ENCOUNTER — Telehealth: Payer: Self-pay | Admitting: Licensed Clinical Social Worker

## 2025-01-01 NOTE — Telephone Encounter (Signed)
 Jerry [name] leaves a voicemail saying that he overslept for his appointment today but would like to reschedule so the therapist requests that the Receptionist call him to assist him with this.  Jerry Maier, MA, LCSW, [facility], LCAS 01/01/2025

## 2025-01-08 ENCOUNTER — Ambulatory Visit (HOSPITAL_COMMUNITY): Admitting: Licensed Clinical Social Worker

## 2025-01-19 ENCOUNTER — Ambulatory Visit (HOSPITAL_COMMUNITY): Admitting: Student in an Organized Health Care Education/Training Program

## 2025-01-27 ENCOUNTER — Ambulatory Visit: Payer: Self-pay | Admitting: Nurse Practitioner

## 2025-01-29 ENCOUNTER — Telehealth: Admitting: Licensed Clinical Social Worker
